# Patient Record
Sex: Female | Born: 1955
Health system: Southern US, Community
[De-identification: ages and names within clinical notes are randomized; demographics above are authoritative.]

## PROBLEM LIST (undated history)

## (undated) DIAGNOSIS — E785 Hyperlipidemia, unspecified: Secondary | ICD-10-CM

## (undated) DIAGNOSIS — C8590 Non-Hodgkin lymphoma, unspecified, unspecified site: Secondary | ICD-10-CM

## (undated) DIAGNOSIS — G8929 Other chronic pain: Secondary | ICD-10-CM

## (undated) DIAGNOSIS — F419 Anxiety disorder, unspecified: Secondary | ICD-10-CM

## (undated) DIAGNOSIS — Z87442 Personal history of urinary calculi: Secondary | ICD-10-CM

## (undated) DIAGNOSIS — I739 Peripheral vascular disease, unspecified: Secondary | ICD-10-CM

## (undated) DIAGNOSIS — I2510 Atherosclerotic heart disease of native coronary artery without angina pectoris: Secondary | ICD-10-CM

## (undated) DIAGNOSIS — F329 Major depressive disorder, single episode, unspecified: Secondary | ICD-10-CM

## (undated) DIAGNOSIS — R569 Unspecified convulsions: Secondary | ICD-10-CM

## (undated) DIAGNOSIS — M1990 Unspecified osteoarthritis, unspecified site: Secondary | ICD-10-CM

## (undated) DIAGNOSIS — R0602 Shortness of breath: Secondary | ICD-10-CM

## (undated) DIAGNOSIS — D649 Anemia, unspecified: Secondary | ICD-10-CM

## (undated) DIAGNOSIS — I779 Disorder of arteries and arterioles, unspecified: Secondary | ICD-10-CM

## (undated) DIAGNOSIS — J45909 Unspecified asthma, uncomplicated: Secondary | ICD-10-CM

## (undated) DIAGNOSIS — M549 Dorsalgia, unspecified: Secondary | ICD-10-CM

## (undated) DIAGNOSIS — I209 Angina pectoris, unspecified: Secondary | ICD-10-CM

## (undated) DIAGNOSIS — J449 Chronic obstructive pulmonary disease, unspecified: Secondary | ICD-10-CM

## (undated) DIAGNOSIS — J189 Pneumonia, unspecified organism: Secondary | ICD-10-CM

## (undated) DIAGNOSIS — Z5189 Encounter for other specified aftercare: Secondary | ICD-10-CM

## (undated) DIAGNOSIS — I70229 Atherosclerosis of native arteries of extremities with rest pain, unspecified extremity: Secondary | ICD-10-CM

## (undated) DIAGNOSIS — C519 Malignant neoplasm of vulva, unspecified: Secondary | ICD-10-CM

## (undated) DIAGNOSIS — K219 Gastro-esophageal reflux disease without esophagitis: Secondary | ICD-10-CM

## (undated) DIAGNOSIS — I509 Heart failure, unspecified: Secondary | ICD-10-CM

## (undated) DIAGNOSIS — IMO0001 Reserved for inherently not codable concepts without codable children: Secondary | ICD-10-CM

## (undated) DIAGNOSIS — C8589 Other specified types of non-Hodgkin lymphoma, extranodal and solid organ sites: Secondary | ICD-10-CM

## (undated) DIAGNOSIS — I499 Cardiac arrhythmia, unspecified: Secondary | ICD-10-CM

## (undated) DIAGNOSIS — I219 Acute myocardial infarction, unspecified: Secondary | ICD-10-CM

## (undated) DIAGNOSIS — I519 Heart disease, unspecified: Secondary | ICD-10-CM

## (undated) DIAGNOSIS — M810 Age-related osteoporosis without current pathological fracture: Secondary | ICD-10-CM

## (undated) DIAGNOSIS — C801 Malignant (primary) neoplasm, unspecified: Secondary | ICD-10-CM

## (undated) DIAGNOSIS — C8300 Small cell B-cell lymphoma, unspecified site: Secondary | ICD-10-CM

## (undated) DIAGNOSIS — I639 Cerebral infarction, unspecified: Secondary | ICD-10-CM

## (undated) DIAGNOSIS — E119 Type 2 diabetes mellitus without complications: Secondary | ICD-10-CM

## (undated) DIAGNOSIS — J8482 Adult pulmonary Langerhans cell histiocytosis: Secondary | ICD-10-CM

## (undated) DIAGNOSIS — F32A Depression, unspecified: Secondary | ICD-10-CM

## (undated) DIAGNOSIS — I10 Essential (primary) hypertension: Secondary | ICD-10-CM

## (undated) HISTORY — PX: ABDOMINAL HYSTERECTOMY: SHX81

## (undated) HISTORY — DX: Malignant neoplasm of vulva, unspecified: C51.9

## (undated) HISTORY — DX: Heart disease, unspecified: I51.9

## (undated) HISTORY — DX: Atherosclerotic heart disease of native coronary artery without angina pectoris: I25.10

## (undated) HISTORY — DX: Non-Hodgkin lymphoma, unspecified, unspecified site: C85.90

## (undated) HISTORY — DX: Type 2 diabetes mellitus without complications: E11.9

## (undated) HISTORY — PX: LUNG BIOPSY: SHX232

## (undated) HISTORY — PX: DILATION AND CURETTAGE OF UTERUS: SHX78

## (undated) HISTORY — PX: BACK SURGERY: SHX140

## (undated) HISTORY — DX: Small cell B-cell lymphoma, unspecified site: C83.00

## (undated) HISTORY — PX: OTHER SURGICAL HISTORY: SHX169

## (undated) HISTORY — DX: Chronic obstructive pulmonary disease, unspecified: J44.9

## (undated) HISTORY — DX: Disorder of arteries and arterioles, unspecified: I77.9

## (undated) HISTORY — DX: Essential (primary) hypertension: I10

## (undated) HISTORY — DX: Hyperlipidemia, unspecified: E78.5

## (undated) HISTORY — DX: Other chronic pain: G89.29

## (undated) HISTORY — PX: CARDIAC CATHETERIZATION: SHX172

## (undated) HISTORY — DX: Peripheral vascular disease, unspecified: I73.9

## (undated) HISTORY — PX: PORTACATH PLACEMENT: SHX2246

## (undated) HISTORY — DX: Dorsalgia, unspecified: M54.9

---

## 1999-08-22 ENCOUNTER — Ambulatory Visit (HOSPITAL_COMMUNITY): Admission: RE | Admit: 1999-08-22 | Discharge: 1999-08-22 | Payer: Self-pay | Admitting: Cardiovascular Disease

## 1999-08-22 ENCOUNTER — Encounter: Payer: Self-pay | Admitting: Cardiovascular Disease

## 2000-10-17 ENCOUNTER — Ambulatory Visit (HOSPITAL_COMMUNITY): Admission: RE | Admit: 2000-10-17 | Discharge: 2000-10-17 | Payer: Self-pay | Admitting: Cardiology

## 2000-10-17 ENCOUNTER — Encounter: Payer: Self-pay | Admitting: Cardiology

## 2000-10-21 ENCOUNTER — Encounter: Payer: Self-pay | Admitting: Cardiology

## 2000-10-21 ENCOUNTER — Ambulatory Visit (HOSPITAL_COMMUNITY): Admission: RE | Admit: 2000-10-21 | Discharge: 2000-10-21 | Payer: Self-pay | Admitting: Cardiology

## 2001-08-11 ENCOUNTER — Ambulatory Visit (HOSPITAL_COMMUNITY): Admission: RE | Admit: 2001-08-11 | Discharge: 2001-08-11 | Payer: Self-pay | Admitting: Pulmonary Disease

## 2001-10-27 ENCOUNTER — Inpatient Hospital Stay (HOSPITAL_COMMUNITY): Admission: EM | Admit: 2001-10-27 | Discharge: 2001-10-28 | Payer: Self-pay | Admitting: Emergency Medicine

## 2001-10-27 ENCOUNTER — Encounter: Payer: Self-pay | Admitting: Emergency Medicine

## 2002-04-16 ENCOUNTER — Encounter: Payer: Self-pay | Admitting: Cardiology

## 2002-04-16 ENCOUNTER — Encounter (HOSPITAL_COMMUNITY): Admission: RE | Admit: 2002-04-16 | Discharge: 2002-05-16 | Payer: Self-pay | Admitting: Cardiology

## 2002-04-30 ENCOUNTER — Encounter: Payer: Self-pay | Admitting: Cardiology

## 2002-04-30 ENCOUNTER — Ambulatory Visit (HOSPITAL_COMMUNITY): Admission: RE | Admit: 2002-04-30 | Discharge: 2002-04-30 | Payer: Self-pay | Admitting: Cardiology

## 2002-07-02 DIAGNOSIS — I639 Cerebral infarction, unspecified: Secondary | ICD-10-CM

## 2002-07-02 DIAGNOSIS — C8589 Other specified types of non-Hodgkin lymphoma, extranodal and solid organ sites: Secondary | ICD-10-CM

## 2002-07-02 HISTORY — DX: Other specified types of non-hodgkin lymphoma, extranodal and solid organ sites: C85.89

## 2002-07-02 HISTORY — DX: Cerebral infarction, unspecified: I63.9

## 2002-10-23 ENCOUNTER — Encounter: Payer: Self-pay | Admitting: Cardiology

## 2002-10-23 ENCOUNTER — Ambulatory Visit (HOSPITAL_COMMUNITY): Admission: RE | Admit: 2002-10-23 | Discharge: 2002-10-24 | Payer: Self-pay | Admitting: Cardiology

## 2002-12-28 ENCOUNTER — Encounter: Payer: Self-pay | Admitting: Emergency Medicine

## 2002-12-28 ENCOUNTER — Emergency Department (HOSPITAL_COMMUNITY): Admission: EM | Admit: 2002-12-28 | Discharge: 2002-12-28 | Payer: Self-pay | Admitting: Emergency Medicine

## 2003-01-07 ENCOUNTER — Ambulatory Visit (HOSPITAL_COMMUNITY): Admission: RE | Admit: 2003-01-07 | Discharge: 2003-01-07 | Payer: Self-pay | Admitting: Pulmonary Disease

## 2003-01-13 ENCOUNTER — Ambulatory Visit (HOSPITAL_COMMUNITY): Admission: RE | Admit: 2003-01-13 | Discharge: 2003-01-13 | Payer: Self-pay | Admitting: Pulmonary Disease

## 2003-02-18 ENCOUNTER — Encounter (INDEPENDENT_AMBULATORY_CARE_PROVIDER_SITE_OTHER): Payer: Self-pay | Admitting: Neurosurgery

## 2003-02-18 ENCOUNTER — Encounter (INDEPENDENT_AMBULATORY_CARE_PROVIDER_SITE_OTHER): Payer: Self-pay | Admitting: Specialist

## 2003-02-18 ENCOUNTER — Ambulatory Visit (HOSPITAL_COMMUNITY): Admission: RE | Admit: 2003-02-18 | Discharge: 2003-02-18 | Payer: Self-pay | Admitting: Neurosurgery

## 2003-02-18 ENCOUNTER — Encounter: Payer: Self-pay | Admitting: Neurosurgery

## 2003-03-12 ENCOUNTER — Ambulatory Visit (HOSPITAL_COMMUNITY): Admission: RE | Admit: 2003-03-12 | Discharge: 2003-03-12 | Payer: Self-pay | Admitting: Hematology and Oncology

## 2003-03-12 ENCOUNTER — Encounter: Payer: Self-pay | Admitting: Hematology and Oncology

## 2003-07-06 ENCOUNTER — Encounter: Admission: RE | Admit: 2003-07-06 | Discharge: 2003-07-06 | Payer: Self-pay | Admitting: Neurosurgery

## 2003-07-20 ENCOUNTER — Encounter: Admission: RE | Admit: 2003-07-20 | Discharge: 2003-07-20 | Payer: Self-pay | Admitting: Neurosurgery

## 2004-03-16 ENCOUNTER — Ambulatory Visit (HOSPITAL_COMMUNITY): Admission: RE | Admit: 2004-03-16 | Discharge: 2004-03-16 | Payer: Self-pay | Admitting: Cardiology

## 2004-05-09 ENCOUNTER — Ambulatory Visit (HOSPITAL_COMMUNITY): Admission: RE | Admit: 2004-05-09 | Discharge: 2004-05-10 | Payer: Self-pay | Admitting: Neurosurgery

## 2004-06-02 ENCOUNTER — Ambulatory Visit: Payer: Self-pay | Admitting: Cardiology

## 2004-09-08 ENCOUNTER — Ambulatory Visit: Payer: Self-pay | Admitting: Cardiology

## 2004-09-18 ENCOUNTER — Ambulatory Visit: Payer: Self-pay

## 2004-10-10 ENCOUNTER — Ambulatory Visit: Payer: Self-pay | Admitting: Cardiology

## 2004-10-18 ENCOUNTER — Inpatient Hospital Stay (HOSPITAL_COMMUNITY): Admission: EM | Admit: 2004-10-18 | Discharge: 2004-10-21 | Payer: Self-pay | Admitting: Emergency Medicine

## 2004-10-24 ENCOUNTER — Ambulatory Visit: Payer: Self-pay

## 2004-11-24 ENCOUNTER — Ambulatory Visit: Payer: Self-pay | Admitting: Cardiology

## 2004-11-30 ENCOUNTER — Ambulatory Visit: Payer: Self-pay | Admitting: Cardiology

## 2005-01-29 ENCOUNTER — Ambulatory Visit: Payer: Self-pay

## 2005-03-27 ENCOUNTER — Ambulatory Visit: Payer: Self-pay | Admitting: Cardiology

## 2005-04-15 ENCOUNTER — Inpatient Hospital Stay (HOSPITAL_COMMUNITY): Admission: EM | Admit: 2005-04-15 | Discharge: 2005-04-18 | Payer: Self-pay | Admitting: *Deleted

## 2005-08-17 ENCOUNTER — Ambulatory Visit: Payer: Self-pay | Admitting: Cardiovascular Disease

## 2005-09-28 ENCOUNTER — Ambulatory Visit: Payer: Self-pay

## 2005-09-28 ENCOUNTER — Ambulatory Visit: Payer: Self-pay | Admitting: Cardiology

## 2005-11-01 ENCOUNTER — Ambulatory Visit: Payer: Self-pay | Admitting: Cardiology

## 2006-04-12 ENCOUNTER — Ambulatory Visit: Payer: Self-pay | Admitting: Cardiology

## 2006-04-15 ENCOUNTER — Ambulatory Visit: Payer: Self-pay | Admitting: Cardiology

## 2006-07-31 ENCOUNTER — Ambulatory Visit (HOSPITAL_COMMUNITY): Admission: RE | Admit: 2006-07-31 | Discharge: 2006-07-31 | Payer: Self-pay | Admitting: Pulmonary Disease

## 2006-08-28 ENCOUNTER — Ambulatory Visit: Payer: Self-pay

## 2006-09-03 ENCOUNTER — Ambulatory Visit: Payer: Self-pay | Admitting: Cardiology

## 2006-09-09 ENCOUNTER — Ambulatory Visit: Payer: Self-pay

## 2006-11-06 ENCOUNTER — Ambulatory Visit: Payer: Self-pay | Admitting: Cardiology

## 2006-11-28 ENCOUNTER — Ambulatory Visit: Payer: Self-pay | Admitting: *Deleted

## 2006-12-20 ENCOUNTER — Ambulatory Visit (HOSPITAL_COMMUNITY): Admission: RE | Admit: 2006-12-20 | Discharge: 2006-12-20 | Payer: Self-pay | Admitting: Hematology and Oncology

## 2007-07-10 ENCOUNTER — Ambulatory Visit: Payer: Self-pay

## 2007-07-10 ENCOUNTER — Ambulatory Visit: Payer: Self-pay | Admitting: Cardiology

## 2007-07-24 ENCOUNTER — Ambulatory Visit: Payer: Self-pay | Admitting: Thoracic Surgery

## 2007-08-04 ENCOUNTER — Inpatient Hospital Stay (HOSPITAL_COMMUNITY): Admission: RE | Admit: 2007-08-04 | Discharge: 2007-08-09 | Payer: Self-pay | Admitting: Thoracic Surgery

## 2007-08-04 ENCOUNTER — Encounter: Payer: Self-pay | Admitting: Thoracic Surgery

## 2007-08-04 ENCOUNTER — Ambulatory Visit: Payer: Self-pay | Admitting: Thoracic Surgery

## 2007-08-14 ENCOUNTER — Encounter: Admission: RE | Admit: 2007-08-14 | Discharge: 2007-08-14 | Payer: Self-pay | Admitting: Thoracic Surgery

## 2007-08-14 ENCOUNTER — Ambulatory Visit: Payer: Self-pay | Admitting: Thoracic Surgery

## 2007-10-13 ENCOUNTER — Ambulatory Visit: Payer: Self-pay | Admitting: Cardiology

## 2008-04-05 ENCOUNTER — Ambulatory Visit: Payer: Self-pay | Admitting: Cardiology

## 2008-07-14 ENCOUNTER — Ambulatory Visit: Payer: Self-pay

## 2008-10-27 ENCOUNTER — Telehealth: Payer: Self-pay | Admitting: Cardiology

## 2008-10-27 DIAGNOSIS — I6529 Occlusion and stenosis of unspecified carotid artery: Secondary | ICD-10-CM

## 2008-10-27 DIAGNOSIS — I10 Essential (primary) hypertension: Secondary | ICD-10-CM

## 2008-10-27 DIAGNOSIS — I2510 Atherosclerotic heart disease of native coronary artery without angina pectoris: Secondary | ICD-10-CM | POA: Insufficient documentation

## 2008-10-27 DIAGNOSIS — J449 Chronic obstructive pulmonary disease, unspecified: Secondary | ICD-10-CM

## 2008-10-27 DIAGNOSIS — Z87898 Personal history of other specified conditions: Secondary | ICD-10-CM

## 2008-10-27 DIAGNOSIS — E785 Hyperlipidemia, unspecified: Secondary | ICD-10-CM | POA: Insufficient documentation

## 2008-10-27 DIAGNOSIS — J4489 Other specified chronic obstructive pulmonary disease: Secondary | ICD-10-CM

## 2008-10-27 DIAGNOSIS — I739 Peripheral vascular disease, unspecified: Secondary | ICD-10-CM

## 2008-10-27 HISTORY — DX: Chronic obstructive pulmonary disease, unspecified: J44.9

## 2008-10-27 HISTORY — DX: Peripheral vascular disease, unspecified: I73.9

## 2008-10-27 HISTORY — DX: Other specified chronic obstructive pulmonary disease: J44.89

## 2008-10-28 ENCOUNTER — Ambulatory Visit: Payer: Self-pay | Admitting: Internal Medicine

## 2008-10-28 ENCOUNTER — Encounter: Payer: Self-pay | Admitting: Cardiovascular Disease

## 2008-10-28 ENCOUNTER — Ambulatory Visit: Payer: Self-pay

## 2008-11-23 ENCOUNTER — Encounter: Payer: Self-pay | Admitting: Cardiology

## 2008-12-10 ENCOUNTER — Ambulatory Visit: Payer: Self-pay

## 2008-12-10 ENCOUNTER — Ambulatory Visit: Payer: Self-pay | Admitting: Cardiovascular Disease

## 2008-12-10 ENCOUNTER — Encounter (INDEPENDENT_AMBULATORY_CARE_PROVIDER_SITE_OTHER): Payer: Self-pay

## 2008-12-10 DIAGNOSIS — I70229 Atherosclerosis of native arteries of extremities with rest pain, unspecified extremity: Secondary | ICD-10-CM

## 2008-12-10 HISTORY — DX: Atherosclerosis of native arteries of extremities with rest pain, unspecified extremity: I70.229

## 2008-12-22 ENCOUNTER — Telehealth: Payer: Self-pay | Admitting: Cardiology

## 2008-12-23 ENCOUNTER — Encounter: Payer: Self-pay | Admitting: Internal Medicine

## 2008-12-23 ENCOUNTER — Encounter: Payer: Self-pay | Admitting: Cardiology

## 2008-12-24 ENCOUNTER — Ambulatory Visit: Payer: Self-pay | Admitting: Cardiology

## 2008-12-27 ENCOUNTER — Encounter: Payer: Self-pay | Admitting: Cardiovascular Disease

## 2008-12-27 ENCOUNTER — Ambulatory Visit: Payer: Self-pay

## 2008-12-29 ENCOUNTER — Telehealth: Payer: Self-pay | Admitting: Cardiology

## 2009-01-05 ENCOUNTER — Ambulatory Visit: Payer: Self-pay | Admitting: Cardiovascular Disease

## 2009-01-05 ENCOUNTER — Ambulatory Visit (HOSPITAL_COMMUNITY): Admission: RE | Admit: 2009-01-05 | Discharge: 2009-01-05 | Payer: Self-pay | Admitting: Cardiovascular Disease

## 2009-01-25 ENCOUNTER — Encounter: Payer: Self-pay | Admitting: Cardiovascular Disease

## 2009-01-25 ENCOUNTER — Ambulatory Visit: Payer: Self-pay

## 2009-01-26 ENCOUNTER — Ambulatory Visit: Payer: Self-pay

## 2009-01-26 ENCOUNTER — Ambulatory Visit: Payer: Self-pay | Admitting: Cardiovascular Disease

## 2009-02-08 ENCOUNTER — Telehealth: Payer: Self-pay | Admitting: Cardiovascular Disease

## 2009-02-14 ENCOUNTER — Encounter: Payer: Self-pay | Admitting: Cardiology

## 2009-05-16 ENCOUNTER — Encounter: Payer: Self-pay | Admitting: Cardiology

## 2009-05-17 ENCOUNTER — Encounter: Payer: Self-pay | Admitting: Cardiology

## 2009-06-28 ENCOUNTER — Encounter: Payer: Self-pay | Admitting: Cardiology

## 2009-08-12 ENCOUNTER — Ambulatory Visit: Payer: Self-pay | Admitting: Cardiovascular Disease

## 2009-08-12 ENCOUNTER — Ambulatory Visit: Payer: Self-pay

## 2009-08-12 DIAGNOSIS — I700 Atherosclerosis of aorta: Secondary | ICD-10-CM

## 2009-08-12 DIAGNOSIS — I701 Atherosclerosis of renal artery: Secondary | ICD-10-CM

## 2009-08-15 ENCOUNTER — Encounter: Payer: Self-pay | Admitting: Cardiology

## 2009-11-07 ENCOUNTER — Encounter: Payer: Self-pay | Admitting: Cardiology

## 2009-11-07 ENCOUNTER — Encounter: Payer: Self-pay | Admitting: Cardiovascular Disease

## 2010-01-30 ENCOUNTER — Encounter: Payer: Self-pay | Admitting: Cardiovascular Disease

## 2010-03-01 ENCOUNTER — Ambulatory Visit: Payer: Self-pay | Admitting: Cardiology

## 2010-04-24 ENCOUNTER — Encounter: Payer: Self-pay | Admitting: Cardiology

## 2010-04-24 ENCOUNTER — Encounter: Payer: Self-pay | Admitting: Cardiovascular Disease

## 2010-04-25 ENCOUNTER — Encounter: Payer: Self-pay | Admitting: Cardiovascular Disease

## 2010-06-14 ENCOUNTER — Encounter: Payer: Self-pay | Admitting: Cardiology

## 2010-07-23 ENCOUNTER — Encounter: Payer: Self-pay | Admitting: Pulmonary Disease

## 2010-07-23 ENCOUNTER — Encounter: Payer: Self-pay | Admitting: Thoracic Surgery

## 2010-08-03 NOTE — Letter (Signed)
Summary: [facility]   [facility]   Imported By: Roderic Ovens 11/18/2009 11:13:31  _____________________________________________________________________  External Attachment:    Type:   Image     Comment:   External Document

## 2010-08-03 NOTE — Letter (Signed)
Summary: [facility] Oncology Report   [facility] Oncology Report   Imported By: Roderic Ovens 02/10/2010 16:17:59  _____________________________________________________________________  External Attachment:    Type:   Image     Comment:   External Document

## 2010-08-03 NOTE — Miscellaneous (Signed)
Summary: Orders Update  Clinical Lists Changes  Orders: Added new Test order of Arterial Duplex Lower Extremity (Arterial Duplex Low) - Signed 

## 2010-08-03 NOTE — Letter (Signed)
Summary: [facility] F/U Note  [facility] F/U Note   Imported By: Roderic Ovens 12/20/2009 13:30:07  _____________________________________________________________________  External Attachment:    Type:   Image     Comment:   External Document

## 2010-08-03 NOTE — Letter (Signed)
Summary: [facility] Office Note  [facility] Office Note   Imported By: Roderic Ovens 12/05/2009 12:06:59  _____________________________________________________________________  External Attachment:    Type:   Image     Comment:   External Document

## 2010-08-03 NOTE — Miscellaneous (Signed)
Summary: Orders Update  Clinical Lists Changes  Problems: Added new problem of AORTIC ATHEROSCLEROSIS (ICD-440.0) Orders: Added new Test order of Abdominal Aorta Duplex (Abd Aorta Duplex) - Signed

## 2010-08-03 NOTE — Assessment & Plan Note (Signed)
Summary: J1B   Visit Type:  31m follow up Primary Hannia Matchett:  Dr Isabel Caprice  CC:  shortness of breath and chest pain.  History of Present Illness: Lately has not had chemotherapy because her cells bottom out.  She feels better since gaining some weight. [name] scheduled for MRI with contrast.   She has alot of back pain.  She has seen Dr. Excell Seltzer, and her stents are blocked, but she does get a a fair amount of claudication in the left leg associated with activity.  She does not smoke as much as she did.    Current Medications (verified): 1)  Metoprolol Tartrate 50 Mg Tabs (Metoprolol Tartrate) .... Take 1/2 Tablet By Mouth Twice A Day 2)  Nitroglycerin 0.4 Mg Subl (Nitroglycerin) .... Place 1 Tablet Under Tongue As Directed 3)  Fish Oil 1000 Mg Caps (Omega-3 Fatty Acids) .... Take 1 Capsule By Mouth Once A Day 4)  Vitamin B-12 500 Mcg  Tabs (Cyanocobalamin) .... Take 1 Tablet By Mouth Once A Day 5)  Prednisone 20 Mg Tabs (Prednisone) .... Take 1 Tablet By Mouth Once A Day 6)  Ventolin Hfa 108 (90 Base) Mcg/act Aers (Albuterol Sulfate) .... Three Times A Day 7)  Diazepam 5 Mg Tabs (Diazepam) .... Three Times A Day 8)  Alprazolam 1 Mg Tabs (Alprazolam) .... Three Times A Day 9)  Roxicodone 30 Mg Tabs (Oxycodone Hcl) .... As Needed 10)  Oxycontin 40 Mg Xr12h-Tab (Oxycodone Hcl) .... As Needed 11)  Folic Acid 1 Mg Tabs (Folic Acid) .... One Tablet By Mouth Once Daily 12)  Fluorometholone 0.1 % Susp (Fluorometholone) .... Both Eyes Two Times A Day 13)  Aspirin 81 Mg Tbec (Aspirin) .... Take One Tablet By Mouth Daily 14)  Nexium 40 Mg Cpdr (Esomeprazole Magnesium) .... Once Daily 15)  Sulfamethoxazole-Trimethoprim 400-80 Mg/58ml Soln (Sulfamethoxazole-Trimethoprim) .... Take 2 Talbets Every Saturday and Sunday  (Rx Dose Unknown)  Allergies (verified): No Known Drug Allergies  Vital Signs:  Patient profile:   54 year old female Height:      64 inches Weight:      154.50 pounds BMI:      26 .62 Pulse rate:   68 / minute BP sitting:   124 / 86  (right arm) Cuff size:   large  Vitals Entered By: Caralee Ates CMA (March 01, 2010 4:00 PM)  Physical Exam  Head:  normocephalic and atraumatic Eyes:  PERRLA/EOM intact; conjunctiva and lids normal. Lungs:  Minimal Ronchii, and no rales. Heart:  PMI non displaced.  Normal S1 and S2.  Minimal SEM.    Pulses:  decreases pulses LLE.    EKG  Procedure date:  03/01/2010  Findings:      NSR.  WNL.    Impression & Recommendations:  Problem # 1:  CAD, NATIVE VESSEL (ICD-414.01) Has been stable.  No chest pain at present. Her updated medication list for this problem includes:    Metoprolol Tartrate 50 Mg Tabs (Metoprolol tartrate) .[name]... Take 1/2 tablet by mouth twice a day    Nitroglycerin 0.4 Mg Subl (Nitroglycerin) .[name]... Place 1 tablet under tongue as directed    Aspirin 81 Mg Tbec (Aspirin) .[name]... 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  Saw Dr. Excell Seltzer.   Problem # 3:  Hx of NON-HODGKIN'S LYMPHOMA, HX OF (ICD-V10.79) still receiving chemotherapy.   Patient Instructions: 1)  Your physician recommends that you schedule a  follow-up appointment in: 6 months 2)  Your physician recommends that you continue on your current medications as directed. Please refer to the Current Medication list given to you today.

## 2010-08-03 NOTE — Assessment & Plan Note (Signed)
Summary: f68m   Visit Type:  6 months follow up Primary Provider:  Dr Isabel Caprice  CC:  bilateral leg pain.  History of Present Illness: 55 year-old woman with multiple medical problems presenting today for f/u of her vascular disease. She presented with bilateral leg claudication last year and was found to have distal aortic stenosis on ultrasound with abnormal ABI's. She underwent PTA and stenting of the abdominal aorta in July 2010 and has had symptomatic improvement following stenting.  She has cut down on cigarettes, but has not been able to quit.  [name] limited by leg pain and dyspnea. [name] able to walk 10 - 15 minutes without stopping. She has left calf discomfort with walking, worse than the right side. No rest pain or ulcers. Symptoms are much better than prior to stenting. She has episodic chest pain, unchanged over time and nonexertional.  Current Medications (verified): 1)  Metoprolol Tartrate 50 Mg Tabs (Metoprolol Tartrate) .... Take 1/2 Tablet By Mouth Twice A Day 2)  Nitroglycerin 0.4 Mg Subl (Nitroglycerin) .... Place 1 Tablet Under Tongue As Directed 3)  Fish Oil 1000 Mg Caps (Omega-3 Fatty Acids) .... Take 1 Capsule By Mouth Once A Day 4)  Vitamin B-12 500 Mcg  Tabs (Cyanocobalamin) .... Take 1 Tablet By Mouth Once A Day 5)  Prednisone 20 Mg Tabs (Prednisone) .... Take 1 Tablet By Mouth Once A Day 6)  Ventolin Hfa 108 (90 Base) Mcg/act Aers (Albuterol Sulfate) .... Three Times A Day 7)  Hydrocodone-Acetaminophen 10-325 Mg Tabs (Hydrocodone-Acetaminophen) .... As Needed 8)  Diazepam 5 Mg Tabs (Diazepam) .... Three Times A Day 9)  Alprazolam 1 Mg Tabs (Alprazolam) .... Three Times A Day 10)  Oxycodone Hcl 15 Mg Tabs (Oxycodone Hcl) .... As Needed 11)  Oxycontin 40 Mg Xr12h-Tab (Oxycodone Hcl) .... As Needed 12)  Folic Acid 1 Mg Tabs (Folic Acid) .... One Tablet By Mouth Once Daily 13)  Vit A .... Once Daily 14)  Fluorometholone 0.1 % Susp (Fluorometholone) .... Both  Eyes Two Times A Day  Allergies (verified): No Known Drug Allergies  Past History:  Past medical history reviewed for relevance to current acute and chronic problems.  Past Medical History: Coronary artery disease status post stenting of the aortic and     iliac vessels by Dr. Allyson Sabal. Repeat aortic stenting 2010. 2. Coronary artery disease with occlusion of the right coronary artery     filled by collaterals from the left coronary system. 3. History of left ventricular dysfunction with ejection fraction of     35% range. 4. History of carotid cerebrovascular disease with 0-39% bilateral     internal carotid artery stenosis with no flow detected in the left     vertebral artery and the left subclavian disease without definite     symptoms. 5. History of non-Hodgkin's lymphoma. 6. Chronic back pain status post lumbar surgery. 7. Hyperlipidemia. 8. Hypertension. 9. Status post lung resection. 10.Chronic obstructive pulmonary disease.  Review of Systems       Negative except as per HPI   Vital Signs:  Patient profile:   55 year old female Height:      64 inches Weight:      136 pounds BMI:     23.43 Pulse rate:   64 / minute Pulse rhythm:   regular Resp:     18 per minute BP sitting:   110 / 62  (right arm) Cuff size:   large  Vitals Entered By: Doree Fudge  Jaramillo (August 12, 2009 11:49 AM)  Physical Exam  General:  Pt is alert and oriented, in no acute distress. HEENT: normal Neck: normal carotid upstrokes without bruits, JVP normal Lungs: CTA CV: RRR without murmur or gallop Abd: soft, NT, positive BS, no bruit, no organomegaly Ext: no clubbing, cyanosis, or edema. femoral pulses 2+= with bilateral bruits, PT pulses 2+=, DP pulses difficult to palpate Skin: warm and dry without rash    Impression & Recommendations:  Problem # 1:  AORTIC ATHEROSCLEROSIS (ICD-440.0) Pt is stable s/p stenting. She had ultrasound studies done today - these have not yet been  interpreted. Prelim review showed a patent aortic stent, elevated left iliac velocities, and ABI's of 1.0 on the right and 0.9 on the left. She can be followed clinically and should have repeat studies in one year. Tobacco cessation was reviewed in detail and the negative impact on vascular and cardiac outcomes was stressed. There was greater than 3 min discussion on this. She should continue on antiplatelet Rx with ASA. We will need to verify that she is taking this because it's not on her med list - will call to confirm.  Problem # 2:  CAROTID ARTERY DISEASE (ICD-433.10) Mild by ultrasound last year. follow-up with duplex scan 2012.   The following medications were removed from the medication list:    Plavix 75 Mg Tabs (Clopidogrel bisulfate) ..... Once daily  Problem # 3:  CAD, NATIVE VESSEL (ICD-414.01) Stable without exertional angina. Cath results from 2010 reviewed and show chronic RCA occlusion. Followed by Dr Riley Kill - will set her up to see Dr Riley Kill in 6 months and I will see her back in 12 months.  The following medications were removed from the medication list:    Plavix 75 Mg Tabs (Clopidogrel bisulfate) ..... Once daily Her updated medication list for this problem includes:    Metoprolol Tartrate 50 Mg Tabs (Metoprolol tartrate) .[name]... Take 1/2 tablet by mouth twice a day    Nitroglycerin 0.4 Mg Subl (Nitroglycerin) .[name]... Place 1 tablet under tongue as directed  Problem # 4:  ATHEROSCLEROSIS, RENAL ARTERY (ICD-440.1) Pt had increased velocities across the left renal artery in the severe range. BP is well-controlled and renal function has been ok. Follow-up renal duplex next year when she has her other ultrasound studies.  Patient Instructions: 1)  Your physician wants you to follow-up in: 6 MONTHS with Dr Riley Kill and 1 YEAR with Dr Excell Seltzer.  You will receive a reminder letter in the mail two months in advance. If you don't receive a letter, please call our office to schedule the  follow-up appointment. 2)  Your physician has requested that you have an abdominal aorta duplex in 1 YEAR. During this test, an ultrasound is used to evaluate the aorta. Allow 30 minutes for this exam. Do not eat after midnight the day before and avoid carbonated beverages. There are no restrictions or special instructions. 3)  Your physician has requested that you have an ankle brachial index (ABI) in 1 YEAR. During this test an ultrasound and blood pressure cuff are used to evaluate the arteries that supply the arms and legs with blood. Allow thirty minutes for this exam. There are no restrictions or special instructions. 4)  Your physician has requested that you have a carotid duplex in 1 YEAR. This test is an ultrasound of the carotid arteries in your neck. It looks at blood flow through these arteries that supply the brain with blood. Allow one hour for  this exam. There are no restrictions or special instructions. 5)  Your physician has requested that you have a renal artery duplex in 1 YEAR. During this test, an ultrasound is used to evaluate blood flow to the kidneys. Allow one hour for this exam. Do not eat after midnight the day before and avoid carbonated beverages. Take your medications as you usually do.  Appended Document: f51m Dr Excell Seltzer wanted to clarify why the pt does not have Aspirin listed on her medication list.  I spoke with the pt and she stopped Aspirin about a month ago due to bruising and bleeding easily if she cut herself.  I told the pt with her history Dr Excell Seltzer recommended that the pt restart Aspirin 81mg  once a day. The pt agrees with the plan.   Clinical Lists Changes  Medications: Added new medication of ASPIRIN 81 MG TBEC (ASPIRIN) Take one tablet by mouth daily

## 2010-08-03 NOTE — Letter (Signed)
Summary: [facility] Office Note   [facility] Office Note   Imported By: Roderic Ovens 06/08/2010 10:10:01  _____________________________________________________________________  External Attachment:    Type:   Image     Comment:   External Document

## 2010-08-03 NOTE — Letter (Signed)
Summary: [facility] Note  [facility] Note   Imported By: Roderic Ovens 01/27/2010 13:43:36  _____________________________________________________________________  External Attachment:    Type:   Image     Comment:   External Document

## 2010-08-15 ENCOUNTER — Encounter: Payer: Self-pay | Admitting: Cardiovascular Disease

## 2010-08-16 ENCOUNTER — Encounter (INDEPENDENT_AMBULATORY_CARE_PROVIDER_SITE_OTHER): Payer: Medicare Other

## 2010-08-16 ENCOUNTER — Encounter: Payer: Self-pay | Admitting: Cardiovascular Disease

## 2010-08-16 DIAGNOSIS — I2510 Atherosclerotic heart disease of native coronary artery without angina pectoris: Secondary | ICD-10-CM

## 2010-08-16 DIAGNOSIS — G458 Other transient cerebral ischemic attacks and related syndromes: Secondary | ICD-10-CM

## 2010-08-16 DIAGNOSIS — I6529 Occlusion and stenosis of unspecified carotid artery: Secondary | ICD-10-CM

## 2010-08-16 DIAGNOSIS — I700 Atherosclerosis of aorta: Secondary | ICD-10-CM

## 2010-08-16 DIAGNOSIS — I70219 Atherosclerosis of native arteries of extremities with intermittent claudication, unspecified extremity: Secondary | ICD-10-CM

## 2010-08-23 NOTE — Letter (Signed)
Summary: [facility] Office Note   [facility] Office Note   Imported By: Roderic Ovens 08/18/2010 14:08:03  _____________________________________________________________________  External Attachment:    Type:   Image     Comment:   External Document

## 2010-08-23 NOTE — Letter (Signed)
Summary: [facility]  [facility]   Imported By: Marylou Mccoy 08/09/2010 15:43:37  _____________________________________________________________________  External Attachment:    Type:   Image     Comment:   External Document

## 2010-08-23 NOTE — Letter (Signed)
Summary: [facility] Office Note   [facility] Office Note   Imported By: Roderic Ovens 08/17/2010 12:36:57  _____________________________________________________________________  External Attachment:    Type:   Image     Comment:   External Document

## 2010-08-23 NOTE — Miscellaneous (Signed)
Summary: Orders Update  Clinical Lists Changes  Orders: Added new Test order of Arterial Duplex Lower Extremity (Arterial Duplex Low) - Signed 

## 2010-08-23 NOTE — Miscellaneous (Signed)
Summary: Orders Update  Clinical Lists Changes  Orders: Added new Test order of Carotid Duplex (Carotid Duplex) - Signed 

## 2010-08-23 NOTE — Miscellaneous (Signed)
Summary: Orders Update  Clinical Lists Changes  Orders: Added new Test order of Renal Artery Duplex (Renal Artery Duplex) - Signed 

## 2010-08-24 ENCOUNTER — Ambulatory Visit: Payer: Self-pay | Admitting: Cardiovascular Disease

## 2010-08-30 ENCOUNTER — Ambulatory Visit (INDEPENDENT_AMBULATORY_CARE_PROVIDER_SITE_OTHER): Payer: Medicare Other | Admitting: Cardiovascular Disease

## 2010-08-30 ENCOUNTER — Encounter: Payer: Self-pay | Admitting: Cardiovascular Disease

## 2010-08-30 DIAGNOSIS — I70219 Atherosclerosis of native arteries of extremities with intermittent claudication, unspecified extremity: Secondary | ICD-10-CM

## 2010-08-31 ENCOUNTER — Ambulatory Visit: Payer: Self-pay | Admitting: Cardiology

## 2010-09-07 NOTE — Assessment & Plan Note (Signed)
Summary: F1Y   Visit Type:  Follow-up Primary Provider:  Dr Isabel Caprice  CC:  leg pain.  History of Present Illness: 55 year-old woman with diffuse coronary and peripheral vascular disease presents for followup evaluation. She has undergone previous iliac and aortic stenting. Most recent ABI's and duplex scanning performed earlier this month showed severely elevated left iliac velocities with a drop in her left ABI to 0.65.  She describes numbness in both legs. She describes pain in both legs with walking, but the left leg is much worse than the right. The pain occurs at a short distance (walking from waiting room into exam room). Denies rest pain or ulceration.   She also complains of shortness of breath. No cough. Episodic chest pain but not as bad as in past.  Current Medications (verified): 1)  Metoprolol Tartrate 50 Mg Tabs (Metoprolol Tartrate) .... Take 1/2 Tablet By Mouth Twice A Day 2)  Nitroglycerin 0.4 Mg Subl (Nitroglycerin) .... Place 1 Tablet Under Tongue As Directed 3)  Fish Oil 1000 Mg Caps (Omega-3 Fatty Acids) .... Take 1 Capsule By Mouth Once A Day 4)  Vitamin B-12 500 Mcg  Tabs (Cyanocobalamin) .... Take 1 Tablet By Mouth Once A Day 5)  Prednisone 20 Mg Tabs (Prednisone) .... Take 1 Tablet By Mouth Once A Day 6)  Ventolin Hfa 108 (90 Base) Mcg/act Aers (Albuterol Sulfate) .... Three Times A Day 7)  Diazepam 5 Mg Tabs (Diazepam) .... Three Times A Day 8)  Alprazolam 1 Mg Tabs (Alprazolam) .... Three Times A Day 9)  Roxicodone 30 Mg Tabs (Oxycodone Hcl) .... As Needed 10)  Oxycontin 40 Mg Xr12h-Tab (Oxycodone Hcl) .... As Needed 11)  Fluorometholone 0.1 % Susp (Fluorometholone) .... Both Eyes Two Times A Day 12)  Aspirin 81 Mg Tbec (Aspirin) .... Take One Tablet By Mouth Daily 13)  Nexium 40 Mg Cpdr (Esomeprazole Magnesium) .... Once Daily 14)  Sulfamethoxazole-Trimethoprim 400-80 Mg/53ml Soln (Sulfamethoxazole-Trimethoprim) .... Take 2 Talbets Every Saturday and "Sunday   (Rx Dose Unknown) 15)  Crestor 40 Mg Tabs (Rosuvastatin Calcium) .... Once Daily 16)  Wellbutrin Xl 150 Mg Xr24h-Tab (Bupropion Hcl) .... One Tablet Once Daily  Allergies (verified): No Known Drug Allergies  Past History:  Past medical history reviewed for relevance to current acute and chronic problems.  Past Medical History: Reviewed history from 08/12/2009 and no changes required. Coronary artery disease status post stenting of the aortic and     iliac vessels by Dr. Berry. Repeat aortic stenting 2010. 2. Coronary artery disease with occlusion of the right coronary artery     filled by collaterals from the left coronary system. 3. History of left ventricular dysfunction with ejection fraction of     35% range. 4. History of carotid cerebrovascular disease with 0-39% bilateral     internal carotid artery stenosis with no flow detected in the left     vertebral artery and the left subclavian disease without definite     symptoms. 5. History of non-Hodgkin's lymphoma. 6. Chronic back pain status post lumbar surgery. 7. Hyperlipidemia. 8. Hypertension. 9. Status post lung resection. 10.Chronic obstructive pulmonary disease.  Review of Systems       Positive for fatigue, weakness, weight gain. Otherwise negative except as per HPI   Vital Signs:  Patient profile:   54 year old female Height:      64 inches Weight:      167 pounds BMI:     28" .77 Pulse rate:   70 /  minute Pulse rhythm:   regular Resp:     16 per minute BP sitting:   132 / 74  (right arm) Cuff size:   regular  Vitals Entered By: Judithe Modest CMA (August 30, 2010 1:59 PM)  Physical Exam  General:  Pt is alert and oriented, Cushingoid, in no acute distress. HEENT: normal Neck: normal carotid upstrokes with bilateral bruits, JVP normal Lungs: CTA CV: RRR without murmur or gallop Abd: soft, NT, positive BS, obese, positive bruit, no organomegaly Ext: no clubbing, cyanosis, or edema. femoral pulses  diminished bilaterally - 1+ right and trace left. DP/PT 1+ on right, nonpalp on left Skin: diffuse ecchymoses    ABI's  Procedure date:  08/16/2010  Findings:      Right leg 0.91, Left leg 0.65  EKG  Procedure date:  08/30/2010  Findings:      NSr, T wave changes consider lateral ischemia.  Carotid Doppler  Procedure date:  08/16/2010  Findings:      40-59% bilateral ICA stenosis, severe left subclavian stenosis.  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 She has undergone multiple aorto-iliac stent procedures and has severe left iliac restenosis based on findings of her noninvasive studies. This is associated with a reduced ABI. Unfortunately she continues to smoke - she understands that her arterial disease is directly related to this. Because of her marked limitation associated with severe inflow disease, I have recommended aortic angiography with an eye toward aortic/lower extremity PTA. Risks and indications of the procedure were reviewed with her in detail and she agrees to proceed. I will review her case with Dr Cleone Slim who follows her closely. One consideration would be treatment with a covered stent ([facility] iCast), but I am concerned about her ability to adhere to long-term dual antiplatelet Rx.  Orders: EKG w/ Interpretation (93000) PV Procedure (PV Procedure)  Problem # 2:  CAROTID ARTERY DISEASE (ICD-433.10) Continue med management. Tobacco cessation discussed. No role for subclavian intervention as she has no symptoms of arm claudication or vertebrobasilar insufficiency.  Her updated medication list for this problem includes:    Aspirin 81 Mg Tbec (Aspirin) .[name]... Take one tablet by mouth daily  Patient Instructions: 1)  Your physician recommends that you continue on your current medications as directed. Please refer to the Current  Medication list given to you today. 2)  Your physician has requested that you have a peripheral vascular angiogram. This exam is performed at the hospital. During this exam IV contrast is used to look at arterial blood flow.  Please review the information sheet given for details.

## 2010-09-07 NOTE — Letter (Addendum)
Summary: Peripheral Vascular  [facility], Main Office  [address]   [place], [place] [address]   Phone: [phone]  Fax: [phone]     08/30/2010 MRN: [id number]  [facility] [address] [place], [place]  [address]  [place]  Dear Ms. Gelpi,   You are scheduled for Peripheral Vascular Angiogram on Wednesday September 13, 2010 with Dr. Excell Seltzer.  Please arrive at the [facility] at 9:30     a.m. on the day of your procedure.  1. DIET     _X___ Nothing to eat or drink after midnight except your medications with a sip of water.  2. MAKE SURE YOU TAKE YOUR ASPIRIN.  3. _X___ YOU MAY TAKE ALL of your remaining medications with a small amount of water.      _X___ START NEW medications: Please take 300mg  of Plavix on 09/12/10--samples given      4. Plan for one night stay - bring personal belongings (i.e. toothpaste, toothbrush, etc.)  5. Bring a current list of your medications and current insurance cards.  6. Must have a responsible person to drive you home.   7. Someone must be with you for the first 24 hours after you arrive home.  8. Please wear clothes that are easy to get on and off and wear slip-on shoes.  *Special note: Every effort is made to have your procedure done on time.  Occasionally there are emergencies that present themselves at the hospital that may cause delays.  Please be patient if a delay does occur.  If you have any questions after you get home, please call the office at the number listed above.   Julieta Gutting, RN, BSN  Appended Document: Peripheral Vascular Pt procedure rescheduled to 09/27/10 at 11:30.  Pt will take Plavix on 09/26/10.

## 2010-09-12 ENCOUNTER — Telehealth: Payer: Self-pay | Admitting: Cardiovascular Disease

## 2010-09-19 NOTE — Progress Notes (Signed)
Summary: need to R/S procedure  Phone Note Call from Patient Call back at Home Phone [phone]   Caller: Patient Summary of Call: Pt need to R/S procedure that is scheduled for tomorrow due to her being sick Initial call taken by: Judie Grieve,  September 12, 2010 11:17 AM  Follow-up for Phone Call        The pt has been having problems with acid reflux for 3 days and has been having to sleep in a chair at night due to reflux.  The pt said her insurance will not pay for reflux meds at this time.  I spoke with Selena Batten in the cath lab and rescheduled the pt's procedure to 09/27/10 at 11:30.  The pt was instructed to take Plavix on 09/26/10. Pt aware of new procedure date.  Follow-up by: Julieta Gutting, RN, BSN,  September 12, 2010 12:15 PM

## 2010-09-23 ENCOUNTER — Encounter: Payer: Self-pay | Admitting: Cardiology

## 2010-09-27 ENCOUNTER — Inpatient Hospital Stay (HOSPITAL_COMMUNITY)
Admission: RE | Admit: 2010-09-27 | Discharge: 2010-09-28 | DRG: 253 | Disposition: A | Discharge status: Home / Self Care | Payer: Medicare Other | Source: Ambulatory Visit | Attending: Cardiovascular Disease | Admitting: Cardiovascular Disease

## 2010-09-27 DIAGNOSIS — F17200 Nicotine dependence, unspecified, uncomplicated: Secondary | ICD-10-CM | POA: Diagnosis present

## 2010-09-27 DIAGNOSIS — G8929 Other chronic pain: Secondary | ICD-10-CM | POA: Diagnosis present

## 2010-09-27 DIAGNOSIS — I70219 Atherosclerosis of native arteries of extremities with intermittent claudication, unspecified extremity: Principal | ICD-10-CM | POA: Diagnosis present

## 2010-09-27 DIAGNOSIS — J4489 Other specified chronic obstructive pulmonary disease: Secondary | ICD-10-CM | POA: Diagnosis present

## 2010-09-27 DIAGNOSIS — I6529 Occlusion and stenosis of unspecified carotid artery: Secondary | ICD-10-CM | POA: Diagnosis present

## 2010-09-27 DIAGNOSIS — I10 Essential (primary) hypertension: Secondary | ICD-10-CM | POA: Diagnosis present

## 2010-09-27 DIAGNOSIS — Z7902 Long term (current) use of antithrombotics/antiplatelets: Secondary | ICD-10-CM

## 2010-09-27 DIAGNOSIS — I2510 Atherosclerotic heart disease of native coronary artery without angina pectoris: Secondary | ICD-10-CM | POA: Diagnosis present

## 2010-09-27 DIAGNOSIS — I2589 Other forms of chronic ischemic heart disease: Secondary | ICD-10-CM | POA: Diagnosis present

## 2010-09-27 DIAGNOSIS — J449 Chronic obstructive pulmonary disease, unspecified: Secondary | ICD-10-CM | POA: Diagnosis present

## 2010-09-27 DIAGNOSIS — C8589 Other specified types of non-Hodgkin lymphoma, extranodal and solid organ sites: Secondary | ICD-10-CM | POA: Diagnosis present

## 2010-09-27 DIAGNOSIS — Z7982 Long term (current) use of aspirin: Secondary | ICD-10-CM

## 2010-09-27 DIAGNOSIS — E785 Hyperlipidemia, unspecified: Secondary | ICD-10-CM | POA: Diagnosis present

## 2010-09-27 LAB — CBC
MCH: 33.2 pg (ref 26.0–34.0)
MCHC: 33.8 g/dL (ref 30.0–36.0)
MCV: 98.1 fL (ref 78.0–100.0)
Platelets: 103 10*3/uL — ABNORMAL LOW (ref 150–400)
RDW: 14.7 % (ref 11.5–15.5)
WBC: 10.2 10*3/uL (ref 4.0–10.5)

## 2010-09-27 LAB — BASIC METABOLIC PANEL
BUN: 13 mg/dL (ref 6–23)
CO2: 28 mEq/L (ref 19–32)
Calcium: 9.4 mg/dL (ref 8.4–10.5)
Creatinine, Ser: 0.86 mg/dL (ref 0.4–1.2)
GFR calc Af Amer: >60 mL/min (ref >60)
Glucose, Bld: 99 mg/dL (ref 70–99)

## 2010-09-27 LAB — GLUCOSE, CAPILLARY: Glucose-Capillary: 80 mg/dL (ref 70–99)

## 2010-09-28 LAB — GLUCOSE, CAPILLARY: Glucose-Capillary: 87 mg/dL (ref 70–99)

## 2010-09-28 LAB — BASIC METABOLIC PANEL
CO2: 30 mEq/L (ref 19–32)
Calcium: 9.4 mg/dL (ref 8.4–10.5)
Chloride: 102 mEq/L (ref 96–112)
GFR calc Af Amer: >60 mL/min (ref >60)
Sodium: 143 mEq/L (ref 135–145)

## 2010-09-28 LAB — CBC
Hemoglobin: 13.5 g/dL (ref 12.0–15.0)
Platelets: 95 10*3/uL — ABNORMAL LOW (ref 150–400)
RBC: 4.13 MIL/uL (ref 3.87–5.11)

## 2010-10-02 NOTE — Procedures (Signed)
NAMESHARAY, BELLISSIMO              ACCOUNT NO.:  [id number]  MEDICAL RECORD NO.:  [id number]           PATIENT TYPE:  O  LOCATION:  6531                         FACILITY:  [facility]  PHYSICIAN:  Veverly Fells. Excell Seltzer, MD  DATE OF BIRTH:  [birth year]  DATE OF PROCEDURE: DATE OF DISCHARGE:                   PERIPHERAL VASCULAR INVASIVE PROCEDURE   PROCEDURES: 1. Abdominal aortic angiogram with bilateral runoff. 2. Bilateral common iliac balloon angioplasty.  PROCEDURAL INDICATIONS:  Ms. Delcid is a 55 year old woman with severe peripheral arterial disease.  She has undergone aortoiliac stenting on several occasions.  I last treated her about 2 years ago when she required a second abdominal aortic stent.  [name] presenting with progressive left leg pain and was found to have an ABI on the left of 0.67 with pressure findings suggestive of severe inflow disease.  We reviewed risks and indications of angiography and PTA with possible stenting.  She understood these risks and agreed to proceed.  Both groins were prepped and draped.  The right femoral artery was accessed with a moderate amount of difficulty.  A 5-French sheath was placed in the right femoral artery using modified Seldinger technique. A Versacore wire was advanced into the suprarenal abdominal aorta and a pigtail catheter was placed just above the most proximal abdominal aortic stent.  Angiography was performed.  LV angiography and oblique angiography was performed of the aortoiliac bifurcation down to the femoral head.  This confirmed severe stenosis at the origin of the left common iliac artery with a previously stented segment.  The aortic stents were patent.  A runoff was performed in the feet and this demonstrated no significant infrainguinal disease bilaterally.  The patient actually has three-vessel runoff to both feet with slower filling on the left side.  I thought the best way to treat her would be with balloon  angioplasty.  She already has multiple stents and she has very small vessels.  The left femoral artery was accessed again with a good bit of difficulty using modified Seldinger technique.  A 7-French Bright Tip sheath was advanced.  Once the sheath was placed, the patient was given weight-based heparin at 50 units/kg.  She has already been preloaded with 300 mg of Plavix, which she took yesterday.  The vessel with the common iliac into the distal abdominal aorta was dilated with a 6 x 20-mm balloon, which was taken to 14 atmospheres.  This improved the appearance of the left common iliac.  There did appear to be little more narrowing at the right common iliac origin.  I thought it would be best to treat her with Kissing balloons.  The same type of 6 x 20 balloon was advanced over a Versacore wire into the right common iliac artery, so that both balloons were at the aortic bifurcation balloon inflation with each 6 x 20 balloon was taken to 15 atmospheres, which is burst pressure.  Final angiography demonstrated an adequate result.  There was some haziness at the origin of left common iliac where the stents overlapped, but I cannot tell if there was significant stenosis in that area.  I elected to do an end-hole catheter pullback across both  sides from the distal abdominal aorta back into the sheath.  On the right side, there was no significant gradient at the iliac origin.  There was a about 15-mm gradient throughout the external iliac artery.  I do not think this warranted treatment.  On the left side, there was only a 5-mm gradient.  Again I felt this was an adequate result.  DIAGNOSTIC FINDINGS:  The abdominal aorta is patent.  It is diffusely diseased.  The aortic stents are widely patent in the distal abdominal aorta.  There is mild narrowing at the right common iliac origin and also another area of mild narrowing just beyond the area of stents in the right common iliac artery.  The  external iliac on the right is widely patent.  The internal iliac has severe stenosis.  The common femoral, deep femoral, superficial femoral, and popliteal are widely patent on the right.  There is three-vessel runoff to the right foot.  Left leg:  The left common iliac has a severe 75-80% ostial stenosis, which is best appreciated in RAO oblique view.  It is really not seen well on other views.  The remaining portions of the common, internal, and external iliac arteries are patent.  Common femoral, superficial femoral, deep femoral, and popliteal are patent.  There is three-vessel runoff to the left foot.  FINAL CONCLUSION: 1. Severe ostial left iliac stenosis with successful treatment using a     6-mm balloon.  The area treated was within the stent to treat in-     stent restenosis. 2. Successful PTA of the right common iliac artery after plaque shift     from the left side.  RECOMMENDATIONS:  The patient will need to continue aspirin and Plavix for a minimum of 30 days.  We will discuss the importance of tobacco cessation.  Her chances for stent patency will be greatly increased if she is at most stop smoking.  The patient will be observed overnight. We will plan on discharge in the morning.     Veverly Fells. Excell Seltzer, MD     MDC/MEDQ  D:  09/27/2010  T:  09/28/2010  Job:  [id number]  cc:   Arturo Morton. Riley Kill, MD, [facility] Lynett Fish, M.D.  Electronically Signed by [name] MD on 10/02/2010 01:14:10 PM

## 2010-10-02 NOTE — Discharge Summary (Signed)
  [name], [name]              ACCOUNT NO.:  [id number]  MEDICAL RECORD NO.:  [id number]           PATIENT TYPE:  O  LOCATION:  6531                         FACILITY:  [facility]  PHYSICIAN:  Veverly Fells. Excell Seltzer, MD  DATE OF BIRTH:  1955/07/31  DATE OF ADMISSION:  09/27/2010 DATE OF DISCHARGE:  09/28/2010                              DISCHARGE SUMMARY   FINAL DIAGNOSIS:  Lower extremity atherosclerosis with intermittent claudication.  SECONDARY DIAGNOSES: 1. Coronary artery disease. 2. Ischemic cardiomyopathy. 3. Carotid arterial disease. 4. Non-Hodgkin lymphoma. 5. Chronic back pain. 6. Hyperlipidemia. 7. Hypertension. 8. Chronic obstructive pulmonary disease. 9. Ongoing tobacco abuse.  PROCEDURES PERFORMED:  On March 28, the patient underwent abdominal aortic angiography with bilateral lower extremity runoff.  She was treated with bilateral iliac PTA for treatment of severe ostial common iliac stenosis.  She tolerated the procedure well and had no procedural complications.  HOSPITAL COURSE:  The patient was admitted following her peripheral vascular procedure as above.  She underwent kissing balloon angioplasty of the bilateral common iliac origins.  On the day of discharge, she had 2+ pedal pulses in both feet.  Her groin sites appeared stable without hematoma or ecchymoses.  Her lab work was reviewed and she has a platelet count 95,000, otherwise her CBC and metabolic panel were within normal limits.  She has chronic mild thrombocytopenia and this is essentially unchanged.  DISPOSITION:  The patient will be discharged home in stable condition. She will follow up in 3-4 weeks for repeat ABIs.  She will continue her regular follow up with Dr. Riley Kill and Dr. Cleone Slim.  I have asked that she continue with aspirin and Plavix for a minimum of 30 days and she should be on long-term low-dose aspirin.  For the remainder of her medications, please see the full medication  reconciliation list.     Veverly Fells. Excell Seltzer, MD     MDC/MEDQ  D:  09/28/2010  T:  09/28/2010  Job:  [id number]  cc:   Lynett Fish, M.D. Arturo Morton. Riley Kill, MD, [facility]  Electronically Signed by [name] MD on 10/02/2010 01:14:12 PM

## 2010-10-04 ENCOUNTER — Ambulatory Visit: Payer: Self-pay | Admitting: Cardiology

## 2010-10-09 LAB — GLUCOSE, CAPILLARY: Glucose-Capillary: 89 mg/dL (ref 70–99)

## 2010-10-09 LAB — CBC
Hemoglobin: 13.7 g/dL (ref 12.0–15.0)
MCHC: 34.7 g/dL (ref 30.0–36.0)
RDW: 12.6 % (ref 11.5–15.5)

## 2010-10-09 LAB — BASIC METABOLIC PANEL
Calcium: 9.5 mg/dL (ref 8.4–10.5)
Creatinine, Ser: 1 mg/dL (ref 0.4–1.2)
GFR calc Af Amer: >60 mL/min (ref >60)
GFR calc non Af Amer: 58 mL/min — ABNORMAL LOW (ref >60)
Glucose, Bld: 96 mg/dL (ref 70–99)
Sodium: 142 mEq/L (ref 135–145)

## 2010-10-09 LAB — PROTIME-INR
INR: 0.9 (ref 0.00–1.49)
Prothrombin Time: 12.2 seconds (ref 11.6–15.2)

## 2010-10-19 ENCOUNTER — Other Ambulatory Visit: Payer: Self-pay | Admitting: Cardiovascular Disease

## 2010-10-19 DIAGNOSIS — I739 Peripheral vascular disease, unspecified: Secondary | ICD-10-CM

## 2010-10-19 DIAGNOSIS — I714 Abdominal aortic aneurysm, without rupture: Secondary | ICD-10-CM

## 2010-10-20 ENCOUNTER — Encounter (INDEPENDENT_AMBULATORY_CARE_PROVIDER_SITE_OTHER): Payer: Medicare Other | Admitting: *Deleted

## 2010-10-20 ENCOUNTER — Encounter (INDEPENDENT_AMBULATORY_CARE_PROVIDER_SITE_OTHER): Payer: Medicare Other | Admitting: Cardiology

## 2010-10-20 DIAGNOSIS — I739 Peripheral vascular disease, unspecified: Secondary | ICD-10-CM

## 2010-10-20 DIAGNOSIS — I700 Atherosclerosis of aorta: Secondary | ICD-10-CM

## 2010-10-20 DIAGNOSIS — I714 Abdominal aortic aneurysm, without rupture: Secondary | ICD-10-CM

## 2010-10-23 ENCOUNTER — Encounter: Payer: Self-pay | Admitting: Cardiovascular Disease

## 2010-11-13 ENCOUNTER — Other Ambulatory Visit: Payer: Self-pay | Admitting: Cardiology

## 2010-11-14 NOTE — Discharge Summary (Signed)
[name], [name]              ACCOUNT NO.:  [id number]   MEDICAL RECORD NO.:  [id number]          PATIENT TYPE:  INP   LOCATION:  2006                         FACILITY:  [facility]   PHYSICIAN:  Ines Bloomer, M.D. DATE OF BIRTH:  [birth year]   DATE OF ADMISSION:  08/04/2007  DATE OF DISCHARGE:  08/09/2007                               DISCHARGE SUMMARY   HISTORY OF PRESENT ILLNESS:  The patient is a 55 year old female  referred to Dr. Edwyna Shell in thoracic surgical consultation for findings of  left cavitary lung nodules.  The patient has a known history of previous  treatment for non-Hodgkin's lymphoma.  For full details, please see the  History and Physical done at the time of admission.  She was admitted  this hospitalization for video-assisted thoracoscopy for biopsy of the  new lung nodules.   MEDICATIONS PRIOR TO ADMISSION:  1. OxyContin 20 mg t.i.d.  2. Oxycodone 5 mg 1 t.i.d.  3. Septra DS, dosage not listed in the History and Physical.  4. Coumadin 1-2 mg q.a.m.  5. Xanax 1 mg t.i.d.  6. Restasis eye drops.  7. Sustain eye drops.  8. Aciphex 20 mg q.a.m.  9. Crestor 40 mg nightly.  10.Isosorbide 30 mg q.a.m.  11.Lasix 40 mg p.r.n.  12.Folic acid 1 mg q.a.m.  13.Nitroglycerin p.r.n.  14.Aspirin 81 mg q.a.m.  15.Lopressor 25 mg 1/2 tablet b.i.d.  16.Hydrocodone 10/500 two p.r.n.  17.Zetia 10 mg nightly.   ALLERGIES:  No known drug allergies.   PAST MEDICAL HISTORY:  1. Non-Hodgkin's lymphoma, status post chemotherapy.  2. Bilateral pulmonary nodules, rule out opportunistic infection.  3. Hypercholesterolemia.  4. Coronary artery disease.  5. Tobacco abuse.  6. Hypertension.  7. Chronic obstructive pulmonary disease.   FAMILY HISTORY/SOCIAL HISTORY/REVIEW OF SYSTEMS/PHYSICAL EXAM:  Please  see the History and Physical done at the time of admission.   HOSPITAL COURSE:  Patient was admitted electively on August 04, 2007  and taken to the operating room at  which time she underwent a left video-  assisted thoracoscopy with minithoracotomy and wedge resection of the  left upper lobe and the left lower lobe.  Initial findings on frozen  showed inflammatory changes.  She was taken to the Post-Anesthesia Care  Unit in stable condition.   POSTOPERATIVE HOSPITAL COURSE:  The patient's course has been mostly  unremarkable.  She did have a postoperative anemia requiring  transfusion.  She has also been placed on iron.  Pain control has been  somewhat difficult, but she was managed with a PCA and then conversion  to oral therapy.  Her incision is healing without evidence of infection.  Pathology is currently pending.  She has been weaned from oxygen and  maintained adequate saturations on room air.  Her chest x-ray reveals a  steady improvement over time and has been monitored daily by Dr. Edwyna Shell.  All routines lines, monitors and drain devices have been discontinued in  the standard fashion.  Laboratory values do reveal the most recent  hemoglobin and hematocrit dated August 07, 2007 to be 8.3 and 23.9  respectively.  Electrolytes on the same date are as follows:  Sodium  133, potassium 3.5, chloride 99, carbon dioxide 31, BUN 8, creatinine  0.69, and she did receive potassium supplement.  Overall, upon morning-  round evaluation on August 09, 2007, the patient was felt to be stable  for discharge by Dr. Edwyna Shell.   CONDITION ON DISCHARGE:  Stable and improving.   MEDICATIONS ON DISCHARGE:  1. Niferex 150 mg daily.  2. Mucinex 1 twice daily.  3. Tylox 1-2 q.4-6 h. as needed.  4. Septra DS.  She can resume her previous schedule.  5. Coumadin 1-2 mg daily as on her previous schedule.  I believe this      is for her Port-A-Cath.  6. Xanax 1 mg t.i.d. p.r.n.  7. Restasis and Sustain eye drops as used previously.  8. Aciphex 20 mg daily.  9. Crestor 40 mg daily.  10.Isosorbide 30 mg daily.  11.Folic acid 1 mg daily.  12.Nitroglycerin sublingual  p.r.n.  13.Lasix 40 mg p.r.n. as used previously.  14.Aspirin 81 mg daily.  15.Metoprolol 25 mg 1/2 tablet twice daily.  16.Zetia 10 mg q.a.m.   FOLLOWUP:  The patient will be seen by Dr. Edwyna Shell next Thursday.  The  office will call with this appointment and also arrange a chest x-ray to  be done at that time.   FINAL DIAGNOSIS:  Cavitary lung nodules.  Pathology is currently pending  to the chart.   OTHER DIAGNOSES:  1. Postoperative acute blood-loss anemia.  2. History of non-Hodgkin's lymphoma.  3. History of hypercholesterolemia.  4. Coronary artery disease.  5. Tobacco abuse.  6. Hypertension.  7. Chronic obstructive pulmonary disease.      Rowe Clack, P.A.-C.      Ines Bloomer, M.D.  Electronically Signed    WEG/MEDQ  D:  08/09/2007  T:  08/11/2007  Job:  [id number]   cc:   Ines Bloomer, M.D.

## 2010-11-14 NOTE — Assessment & Plan Note (Signed)
[name]                            CARDIOLOGY OFFICE NOTE   NAME:[name], [name]                     MRN:          [id number]  DATE:10/13/2007                            DOB:          [birth year]    [name] is in for follow-up.  In general, she is stable.  She had not  been having any ongoing chest pain except for one little episode.  She,  unfortunately, has started to smoke again.  She recently underwent  pleural biopsy.  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  She had a lot of fibrosis and hemorrhage in the lung.  She has been  followed by Dr. Cleone [name] and Dr. Edwyna [name].  She has continued to make some  progress.  She has lost some weight, and she and I had a discussion  today again about the smoking.   MEDICATIONS:  1. Xanax 1 mg t.i.d.  2. Crestor 40 mg daily.  3. Folic acid 1 mg daily.  4. Aciphex 20 mg daily.  5. Methocarbamol p.r.n.  6. Restasis eye drops b.i.d.  7. Septra DS b.i.d. on Saturday and Sunday.  8. Aspirin 81 mg daily.  9. Metoprolol 25 mg 1/2 tablet b.i.d.  10.OxyContin 20 mg b.i.d.   On physical, she is somewhat emaciated.  The blood pressure is 100/70, the pulse is 60.  The weight is 113  pounds.  The lung fields reveal decreased breath sounds bilaterally.   Her EKG reveals sinus bradycardia with a nonspecific T-wave abnormality  with lateral T flattening.   IMPRESSION:  1. Coronary artery disease with prior inferior infarction.  2. Severe advanced peripheral vascular disease.  3. Continued tobacco use.  4. Significant chronic obstructive pulmonary disease, status post      Langerhans bronchiolitis findings by biopsy.  5. History of recurrent abdominal lymphoma, on chemotherapeutic      intervention.   PLAN:  1. Return to clinic in 4-6 months.  2. Continue current medical regimen.  3. Discussion again about basic lifestyle  habits.     Arturo Morton. Riley Kill, MD, [facility]  Electronically Signed    TDS/MedQ  DD: 10/13/2007  DT: 10/13/2007  Job #: [id number]   cc:   Lynett Fish, M.D.  Ines Bloomer, M.D.

## 2010-11-14 NOTE — Op Note (Signed)
[name], [name]              ACCOUNT NO.:  [id number]   MEDICAL RECORD NO.:  [id number]          PATIENT TYPE:  INP   LOCATION:  2867                         FACILITY:  [facility]   PHYSICIAN:  Ines Bloomer, M.D. DATE OF BIRTH:  1956/07/02   DATE OF PROCEDURE:  08/04/2007  DATE OF DISCHARGE:                               OPERATIVE REPORT   PREOPERATIVE DIAGNOSIS:  Status post chemotherapy for non-Hodgkin's  lymphoma with bilateral cavitary nodules.   POSTOPERATIVE DIAGNOSIS:  Status post chemotherapy for non-Hodgkin's  lymphoma with bilateral cavitary nodules.   OPERATION:  Right video assisted thoracoscopic surgery minithoracotomy,  wedge left upper lobe, left lower lobe lung.   SURGEON:  Dr. Patricia Nettle. Burney.   ANESTHESIA:  General anesthesia.   After percutaneous insertion of all monitoring lines, the patient  underwent general anesthesia was turned left lateral thoracotomy  position.  Prepped and draped in the usual sterile manner.  A dual-lumen  tube was inserted.  The left lung was deflated.  Two trocar sites were  made in the anterior and posterior axillary line at seventh intercostal  space.  Two trocars were inserted and insertion of 0 degrees scope we  could see that the patient had marked amount of adhesions of the left  lower lobe and left upper lobe.  These were taken down with a [employer]  ring forceps.  It was very inflamed when we took these down.  We then  made a 5 to 6 cm incision over the sixth intercostal space at the  anterior axillary line and was able to enter the space and palpate the  left upper lobe and left lower lobe, found to have some areas of  nodularity in the left upper lobe along the near the posterior segment  of the lingula.  This was resected with the EZ 45 stapler with several  applications; however, there was easy bleeding in the suture line that  had to be oversewn with 3-0 Vicryl.  In like manner near the superior  segment of left lower  lobe there are __________ areas of nodularity and  this was stapled.  Frozen section of the first staple line revealed  inflammatory changes but no definite cancer. Noted  the posterior staple  line of the left lower lobe staple line was the same.  Two chest tubes  were brought into the trocar sites.  CoSeal was applied to the staple  line.  All bleeding was electrocoagulated.  A Marcaine block was done in  the usual fashion.  __________ inserted in the usual fashion.  The chest  was closed two pericostals drilling through the seventh rib and passed  around the fifth rib, #1 Vicryl in muscle layer, 2-0 Vicryl subcutaneous  tissue and Dermabond for the skin.  The patient was turned to the  recovery room in serious condition.     Ines Bloomer, M.D.  Electronically Signed    DPB/MEDQ  D:  08/04/2007  T:  08/04/2007  Job:  [id number]   cc:   Lynett Fish, M.D.

## 2010-11-14 NOTE — H&P (Signed)
[name], [name]              ACCOUNT NO.:  [id number]   MEDICAL RECORD NO.:  [id number]          PATIENT TYPE:  INP   LOCATION:  NA                           FACILITY:  [facility]   PHYSICIAN:  Ines Bloomer, M.D. DATE OF BIRTH:  Feb 06, 1956   DATE OF ADMISSION:  DATE OF DISCHARGE:                              HISTORY & PHYSICAL   CHIEF COMPLAINT:  Nodules on lung.   HISTORY OF PRESENT ILLNESS:  This 55 year old patient had a low-grade  non-Hodgkin's lymphoma and was treated with a right paraspinal mass and  some right back pain and weakness.  She also had significant  degenerative disk and arthritis in her back.  She was treated with CHOP  and Rituxan  with a complete response and had the maintenance Rituxan  for several years.  In April of 2008, she had an L5-S1 diskectomy and  was also found to have at the C3-C4, that there was a suspected  recurrence of her __________ at T7.  She was again treated with Cytoxan  and Rituxan and had a second course in September.  A CT scan for  reevaluation showed multiple tiny nodules, cavitary, in both the left  and right lung suggestive of an opportunistic infection.  She has had no  hemoptysis, fever, chills, or excessive sputum.  CT scan right now shows  no recurrence of lymphoma.  She has been admitted for biopsies.   MEDICAL DECISION MAKING:  1. Restasis __________ eye drops.  2. Aciphex 20 mg daily.  3. Crestor 40 mg daily.  4. Isosorbide 30 mg daily.  5. Lasix 40 mg daily.  6. Nitroglycerin.  7. Aspirin.  8. Metoprolol 25 mg  daily.  9. Hydrocodone p.r.n.  10.Zetia 10 mg p.r.n.  11.OxyContin 20 mg twice a day.  12.Oxycodone p.r.n.  13.Septra DS.   ALLERGIES:  She has no allergies.   She has hypertension and hypercholesterolemia.  She had a heart attack  in September of 2008.  She has chronic obstructive pulmonary disease.   FAMILY HISTORY:  Positive for vascular disease and cardiac disease.   SOCIAL HISTORY:  [name]  married, single, has two children, retired and  smokes a pack of cigarettes per day.  Does not drink alcohol on a  regular basis.   REVIEW OF SYSTEMS:  She had weight loss, loss of appetite.  [name] 11  pounds.  [name] 5 foot, 4 inches.  CARDIAC:  She has a heart murmur,  angina.  She had some dyspnea and atrial fibrillation.  PULMONARY:  She  has had bronchitis and wheezing.  GI:  She has reflux, hiatal hernia and  constipation.  GU:  Frequent urination, vascular claudication, recent  history of some phlebitis, no TIAs.  NEUROLOGICAL:  Some dizziness.  MUSCULOSKELETAL:  Joint pain and rash.  PSYCHIATRIC:  Has been treated  for nervousness and depression.  EYE/ENT:  No change in her hearing and  recent decrease in her eyesight.  HEMATOLOGICAL:  No problems with  anemia, but is on Coumadin.   PHYSICAL EXAMINATION:  Her blood pressure is 130/80, pulse 80,  respirations 18, sats are 92%.  HEAD:  Atraumatic.  EYES:  Pupils are equal, reactive to light and accommodation.  Extraocular movements are normal.  EARS:  Tympanic membranes are intact.  NOSE:  There is no septal deviation.  NECK:  Supple without thyromegaly.  There is no supraclavicular or  axillary adenopathy.  CHEST:  Clear to auscultation and percussion.  HEART:  Regular sinus rhythm, no murmurs.  ABDOMEN:  Soft.  There is no hepatosplenomegaly.  EXTREMITIES:  Pulses are 2+.  There is no clubbing or edema.  NEUROLOGICAL:  [name] oriented x3.  Sensory and motor intact.  Cranial  nerves intact.   IMPRESSION:  1. Non-Hodgkin's lymphoma.  2. Status post chemotherapy for #1.  3. Bilateral pulmonary nodules, rule out opportunistic infection.  4. Hypercholesterolemia.  5. Coronary artery disease.  6. Tobacco abuse.  7. Hypertension.  8. Chronic obstructive pulmonary disease.      Ines Bloomer, M.D.  Electronically Signed     DPB/MEDQ  D:  07/31/2007  T:  08/01/2007  Job:  [id number]

## 2010-11-14 NOTE — Cardiovascular Report (Signed)
NAMESTANISLAWA, GAFFIN              ACCOUNT NO.:  [id number]   MEDICAL RECORD NO.:  [id number]          PATIENT TYPE:  AMB   LOCATION:  SDS                          FACILITY:  [facility]   PHYSICIAN:  Veverly Fells. Excell Seltzer, MD  DATE OF BIRTH:  07-Jul-1955   DATE OF PROCEDURE:  DATE OF DISCHARGE:  01/05/2009                            CARDIAC CATHETERIZATION   PROCEDURES:  1. Left heart catheterization.  2. Selective coronary angiography.  3. Abdominal aortography with runoff.  4. Abdominal aortic stent.   INDICATIONS:  Ms. Bryand is a 55 year old woman with multiple medical  problems who was evaluated on December 10, 2008 for severe lifestyle-  limiting claudication.  She described bilateral leg pain with walking at  short distance, left worse than right.  Her ABIs were 0.87 on the right  and 0.79 on the left at rest.  She had a noninvasive vascular study that  showed severely elevated velocities in the abdominal aorta above her  previously placed stent.  She also had chest pain and underwent a  Myoview stress scan that showed inferior ischemia.  She therefore was  referred for an abdominal aortogram with runoff as well as coronary  angiography.   Risks and indications of the procedure were reviewed with the patient,  informed consent was obtained, and 5-French sheath was placed in the  right femoral artery using the modified Seldinger technique.  5-French  coronary catheters were used for coronary angiography.  Pigtail catheter  was used to measure left ventricular pressures and perform a pullback  across the aortic valve.  The pigtail catheter was then brought down to  the abdominal aorta where an abdominal aortogram was performed under  digital subtraction.  A runoff to the feet was then performed using a  bolus-chase method, also under digital subtraction.  This demonstrated  severe stenosis of the infrarenal abdominal aorta just above her  previously placed stent and end-hole catheter was  advanced in order to  perform a pressure pullback.  There was a 10-20 mm resting gradient  present.  With nitroglycerin, there was a 40-mm pressure gradient.  Therefore, I elected to treat the vessel based on the patient's  symptoms, a severely elevated velocities on her visceral duplex, and  inducible pressure gradient by catheter pullback.  She was given 3500  units of heparin intravenously.  Her femoral sheath was upsized to an 8-  [place] bright tip sheath.  This was placed just inside of the right  common iliac stent.  The aorta was then stented with a 9 x 28 mm  OmniLink stent, which was deployed at 10 atmospheres.  The stent  appeared well expanded.  I took lateral views of the aorta following  stenting.  The stent appeared somewhat undersized.  I elected to  postdilate with a 10 x 20-mm Powersail balloon, which was taken to 12  atmospheres on two separate inflations to cover the entire stented  segment.  The patient tolerated the procedure well.  Final angiography  demonstrated a widely patent stent.  There were no immediate  complications.  The pigtail catheter and wire were removed  without  difficulty.  The sheath would be pulled and manual pressure was used for  hemostasis.  The patient's closing ACT was 276.   FINDINGS:  Coronary angiography:  Left main coronary artery is widely  patent.  It bifurcates into the LAD and left circumflex.   LAD:  The LAD is a large-caliber vessel that courses down and reaches  the LV apex.  There was no significant stenosis present.  There are two  small diagonal branches present.  The septal perforators of the LAD  supply collaterals to the distal right coronary artery.   Left circumflex:  The left circumflex is widely patent.  It supplies two  OM branches.  There was no significant stenosis throughout.   Right coronary artery:  The right coronary artery is subtotaled.  There  was a long diffuse severe stenosis throughout the midportion of  the  vessel.  Distally, the posterolateral branch and PDA fill from antegrade  flow as well as left-to-right collaterals.   Abdominal aortogram:  There are single renal arteries bilaterally.  The  left renal artery has a moderate proximal stenosis present.  The right  renal artery is widely patent.  The SMA is widely patent.  The distal  abdominal aorta has a severe eccentric stenosis present.  This is above  previously stented segment in the abdominal aorta.  There are stents  extending into both common iliac arteries.  There was a long stented  segment into the right common iliac and a short stented segment into the  left common iliac.  The indwelling stents in the distal aorta and common  iliac arteries are patent.   Right leg:  The right common internal and external iliac arteries are  patent.  There is mild disease at the sheath entry site in the common  femoral artery.  The SFA, deep femoral, popliteal, tibioperoneal trunk  are all patent.  There is three-vessel runoff to the right foot.   Left leg:  The common internal and external iliac arteries are all  patent.  The common femoral artery is patent.  The deep and superficial  femoral arteries are widely patent.  There is three-vessel runoff to the  foot.  The runoff to the left foot is very sluggish.   ASSESSMENT:  1. Severe single-vessel coronary artery disease with left-to-right      collaterals supplying the distal right coronary artery circulation.      This is stable from previous studies.  2. Severe infrarenal abdominal aortic stenosis with successful      stenting as outlined above.  3. Three-vessel runoff bilaterally.   RECOMMENDATIONS:  I recommend 30 days of dual-antiplatelet therapy with  aspirin and Plavix.  The patient should remain on long-term aspirin.      Veverly Fells. Excell Seltzer, MD  Electronically Signed     Veverly Fells. Excell Seltzer, MD  Electronically Signed    MDC/MEDQ  D:  01/05/2009  T:  01/06/2009   Job:  [id number]   cc:   Arturo Morton. Riley Kill, MD, [facility]  Lynett Fish, M.D.

## 2010-11-14 NOTE — Letter (Signed)
July 24, 2007   Lynett Fish, M.D.  [address] [place], [place] [address]   Re:  [name], [name]              DOB:  [birth year]   Dear Rocky Link,   I appreciate the opportunity to see [name].  This 55 year old  patient was found to have a low-grade non-Hodgkin's lymphoma and also,  when she initially presented with a right paraspinal mass, had some left-  sided back pain and weakness.  She also has severe significant  degenerative disc and arthritis of her back.  She was treated with CHOP  and rituximab with a complete response and has been put on maintenance  with rituximab for a period of several years.  She had an L5-S1  discectomy in April of 2008.  She also was found to have a C3-C4 disc  and was found to have repeat recurrence of her non-Hodgkin's lymphoma at  the T7 level.  She was again treated with Cytoxan, and rituximab and had  a second course in September.  She had a CT scan for re-evaluation,  which showed multiple tiny nodules, cavitary, in both the right and left  side, which is suggestive of some type of opportunistic infection.  She  had had no hemoptysis, fevers, chills, excessive sputum.  The rest of  her CT scan shows no evidence of recurrence of her lymphoma.  [name]  referred here for lung biopsy of the multiple small cavitary nodules.   MEDICATIONS INCLUDE:  1. Restasis and Sustain drops twice a day.  2. Aciphex 20 mg a day.  3. Crestor 40 mg daily.  4. Isosorbide 30 mg daily.  5. __________ 1 mg three times a day.  6. Lasix 40 mg daily.  7. Morphine 2 mg daily.  8. Folic acid.  9. Nitroglycerin.  10.Aspirin.  11.Metoprolol 25 mg daily.  12.Hydrocodone p.r.n.  13.Zetia 10 mg daily.  14.OxyContin 20 mg twice a day.  15.Oxycodone p.r.n.  16.Septra-DS.   ALLERGIES:  She has no known allergies.   She has hypertension, hypercholesterolemia.  She had a previous heart  attack in September 2008.  She also has chronic obstructive pulmonary  disease.   FAMILY HISTORY:  Positive for vascular, as well as cardiac disease.   SOCIAL HISTORY:  [name] single, has two children, retired, smokes one  pack a day.  Does not drink alcohol on a regular basis.   REVIEW OF SYSTEMS:  She has had weight-loss, loss of appetite.  [name] 115 pounds.  [name] 5 feet 4.  CARDIAC:  She has a heart murmur, angina, atrial fibrillation and  dyspnea.  PULMONARY:  She has been treated for bronchitis and wheezing.  GI:  She has reflux, hiatal hernia and constipation.  GU:  She has frequent urination.  VASCULAR:  She has claudication.  She has recent history of some  phlebitis.  No TIA's.  NEUROLOGIC:  She has had some dizziness.  MUSCULOSKELETAL:  She has joint pain and muscular pain.  PSYCHIATRIC:  She has been treated for nervousness and depression.  ENT:  She has no change in her hair and recent decrease in her eyesight.  HEMATOLOGICAL:  She has no problems with anemia, but is on Coumadin.   PHYSICAL EXAMINATION:  Her blood pressure is 130/80, pulse 80,  respirations 18 and sats are 92%.  HEAD, EYES, EARS, NOSE AND THROAT are  unremarkable.  NECK:  The neck is supple  without thyromegaly.  There is  no supraclavicular or axillary adenopathy.  CHEST:  Clear to  auscultation and percussion. HEART:  Regular sinus rhythm, no murmurs.  ABDOMEN:  Soft.  There is no hepatosplenomegaly.  PULSES are 2+.  There  is no clubbing or edema.  NEUROLOGICAL:  [name] oriented times three.  Sensory and motor intact.  Cranial nerves are intact.   I feel that she has some type of probable infection.  This more than  likely is a fungus or some other type of opportunistic infection.  I  plan to do a lung biopsy at [facility] on February 2.  The risks of  the procedure have been explained to the patient and she understands all  the risks and agrees with the procedure.   I appreciate the opportunity of seeing [name].   Ines Bloomer, M.D.  Electronically Signed    DPB/MEDQ  D:  07/24/2007  T:  07/25/2007  Job:  [id number]

## 2010-11-14 NOTE — Assessment & Plan Note (Signed)
[facility]                            CARDIOLOGY OFFICE NOTE   NAME:Cleckler, LEMON WHITACRE                     MRN:          [id number]  DATE:07/10/2007                            DOB:          03/10/1956    Ms. Vallin is in for followup.  [name] stable from a cardiac standpoint.  [name] worried about getting her carotid radiated.  She has had  recurrent lymphoma, now has lesions in the spine, pelvis and neck.  Her  blood counts have not been good enough to consider for chemotherapy and,  therefore, radiation has been recommended.  She did have Dopplers done  today.  Carotids reveal 0-39% bilateral ICA stenosis.  There is no flow  detected in the left vertebral, and this has been noted previously.  She  has not been having any ongoing chest pain.  She has tried to keep her  spirits up.   Currently her weight is 114 pounds.  Blood pressure 124/71, pulse 63.  She has decreased breath sounds bilaterally.  CARDIAC:  Rhythm is regular.   EKG today reveals normal sinus rhythm essentially within normal limits.  Preliminary carotid studies are as noted.   IMPRESSION:  1. Coronary artery disease with prior inferior infarction.  2. Advanced peripheral vascular disease.  3. History of tobacco use.  4. History of severe chronic obstructive pulmonary disease.  5. Recurrent abdominal lymphoma, currently not candidate for      chemotherapy to get radiation.   I have tried to reassure her and suggested that she talk with the  radiation oncologist about the options.  This may be necessary for local  reasons in the neck, I have encouraged her to have a positive attitude  about it.  We will see her back in 3 to 6 months.     Arturo Morton. Riley Kill, MD, [facility]  Electronically Signed    TDS/MedQ  DD: 07/10/2007  DT: 07/10/2007  Job #: [id number]   cc:   Lynett Fish, M.D.

## 2010-11-14 NOTE — Assessment & Plan Note (Signed)
[facility]                            CARDIOLOGY OFFICE NOTE   NAME:[name], [name]                     MRN:          [id number]  DATE:04/05/2008                            DOB:          04-19-1956    [name] is in for a followup visit.  [name] continuing to smoke about 5  cigarettes a day.  She has had problems with recurrent infection and she  has had a partial lung resection recently.  She saw Dr. Edwyna Shell and he  stopped her aspirin.  Because of the smoking, she has restarted herself  on Wellbutrin.  She has not had a lot in the way of chest discomfort.   MEDICATIONS:  1. Septra DS 1 p.o. b.i.d.  2. Metoprolol 25 mg 1/2 tablet b.i.d.  3. OxyContin 20 mg t.i.d.  4. Fish oil.  5. B12.  6. Prednisone 20 mg daily.  7. Ventolin inhaler.  8. Tussionex.   ADDITIONAL MEDICATIONS:  1. Xanax 1 mg t.i.d.  2. Crestor 40 mg daily.  3. Folic acid 1 mg daily.  4. Aciphex 20 mg daily.  5. Methocarbamol 750 mg p.r.n.  6. Restasis eye drops b.i.d.   PHYSICAL EXAMINATION:  She weighs 113 pounds.  She appears chronically  ill.  Her skin is sallow.  The lung fields reveal prolonged expiration  with diffuse rhonchi.  Her cardiac rhythm is regular.  There is evidence  of vascular bruits in the upper chest.  Her extremities do not reveal  currently significant edema.   The electrocardiogram demonstrates normal sinus rhythm with short PR and  otherwise normal tracing.   IMPRESSION:  1. Coronary artery disease status post stenting of the aortic and      iliac vessels by Dr. Allyson Sabal.  2. Coronary artery disease with occlusion of the right coronary artery      filled by collaterals from the left coronary system.  3. History of left ventricular dysfunction with ejection fraction of      35% range.  4. History of carotid cerebrovascular disease with 0-39% bilateral      internal carotid artery stenosis with no flow detected in the left      vertebral artery and  the left subclavian disease without definite      symptoms.  5. History of non-Hodgkin's lymphoma.  6. Chronic back pain status post lumbar surgery.  7. Hyperlipidemia.  8. Hypertension.  9. Status post lung resection.  10.Chronic obstructive pulmonary disease.  11.Continue current medical regimen cardiac-wise.  12.Return to clinic in 6 months.     Arturo Morton. Riley Kill, MD, [facility]  Electronically Signed    TDS/MedQ  DD: 04/07/2008  DT: 04/08/2008  Job #: [id number]   cc:   Lynett Fish, M.D.

## 2010-11-14 NOTE — Letter (Signed)
August 14, 2007   Lynett Fish, M.D.  [address] [place], [place] [address]   Re:  LASHONA, SCHAAF              DOB:  June 29, 1956   Dear Rocky Link:   I saw Ms. Dragan back today after pleural biopsies.  Her chest x-ray  showed decrease in pleural and parenchymal opacities in the left lower  lobe.  Her incisions are well healed after her lung biopsy.  Removed her  chest tube sutures.  Her pain has decreased, and also we had to give her  a refill for her Percocet.  The biopsy came back from Dr. Adrienne Mocha in  [place], and I sent you a copy of this.  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  I did talk to her about stopping smoking today, and she  thought that there were areas of cystic changes in the lungs.  Her lung  was very diseased with both fibrosis and hemorrhage in it, but we did  not find any evidence of infection or recurrence of her cancer.  We told  her to gradually increase her activities and I would see.  Her blood  pressure was 116/71, pulse 84, respirations 18.  Saturations were 94%.  I plan to see her back again in two weeks with a chest x-ray.   Ines Bloomer, M.D.  Electronically Signed   DPB/MEDQ  D:  08/14/2007  T:  08/15/2007  Job:  [id number]

## 2010-11-17 NOTE — Group Therapy Note (Signed)
[name], [name]              ACCOUNT NO.:  [id number]   MEDICAL RECORD NO.:  [id number]          PATIENT TYPE:  INP   LOCATION:  A204                          FACILITY:  [facility]   PHYSICIAN:  Kofi A. Gerilyn Pilgrim, M.D. DATE OF BIRTH:  [birth year]   DATE OF PROCEDURE:  DATE OF DISCHARGE:                                   PROGRESS NOTE   The patient is more lucid today, and we are able to get a better history.  She apparently ran out of several of her medications before being admitted  to the hospital.  She apparently did not get them filled for a few days.  She was out of Lasix, Norvasc, Crestor, and Xanax for about 2-3 days.  She  stated that she was tremulous before becoming ill.   MRI with scan does not show any acute process.  It was done with contrast.  There are some chronic ischemic white matter changes.  Currently, the  patient is awake and alert.  [name] lucid and coherent with no focal  deficits.   ASSESSMENT/PLAN:  Toxic metabolic encephalopathy, multifactorial in  etiology.  She does have pseudoseizures, but it is unclear at this time if  these are related to withdrawal as opposed to unprovoked seizures.  She does  not clinically meet the criteria for epilepsy, and therefore it is unclear  if she will need long-term medications.  We will have her come back to the  office for an EEG to help [place] figure that out.      KAD/MEDQ  D:  10/20/2004  T:  10/20/2004  Job:  [id number]

## 2010-11-17 NOTE — Group Therapy Note (Signed)
NAMEANTWONETTE, FELIZ              ACCOUNT NO.:  [id number]   MEDICAL RECORD NO.:  [id number]          PATIENT TYPE:  INP   LOCATION:  A336                          FACILITY:  [facility]   PHYSICIAN:  Edward L. Juanetta Gosling, M.D.DATE OF BIRTH:  August 12, 1955   DATE OF PROCEDURE:  DATE OF DISCHARGE:                                   PROGRESS NOTE   PROBLEM:  Pneumonia.   SUBJECTIVE:  Mrs. Brummitt says she feels great and wants to go home. [name]  coughing up sputum. [name] not short of breath. [name] having no chest pain.   Her physical exam shows that her temperature is 98, pulse is 80,  respirations 20, blood pressure 105/62, and her O2 sat is 97% on 1 liter.   ASSESSMENT:  [name] much improved.   Plan is for her to be discharged home. Please see the discharge summary for  details.      Edward L. Juanetta Gosling, M.D.  Electronically Signed     ELH/MEDQ  D:  04/18/2005  T:  04/18/2005  Job:  [id number]

## 2010-11-17 NOTE — H&P (Signed)
NAMEJUANITTA, EARNHARDT              ACCOUNT NO.:  [id number]   MEDICAL RECORD NO.:  [id number]          PATIENT TYPE:  INP   LOCATION:  A204                          FACILITY:  [facility]   PHYSICIAN:  Edward L. Juanetta Gosling, M.D.DATE OF BIRTH:  Apr 28, 1956   DATE OF ADMISSION:  10/18/2004  DATE OF DISCHARGE:  LH                                HISTORY & PHYSICAL   REASON FOR ADMISSION:  Change in mental status.   HISTORY:  Ms. Nass is a 55 year old who has a long known history of  multiple medical problems including COPD, hypertension, previous peripheral  vascular stent, history of a lymphoma which has been treated with CHOP  therapy.  She was found to be confused at home, had what appeared to be a  seizure and she was brought to the emergency room.  Here in the emergency  room she had what appeared to be another seizure and had CT scan which is  negative acutely, no other definite cause of her problem but she has been on  Xanax and Vicodin, it is not clear how much of those she is taking now and  she may be having some withdrawal although her drug screen is positive for  both at this point.   PAST MEDICAL HISTORY:  Her past medical history is very positive for  multiple medical problems including a previous history of peripheral  vascular disease, coronary artery occlusive disease with a total right  occlusion, a stenting of the aorta and iliac vessels in the past, the  history of lymphoma, history of depression.  She has also had back surgery  on several occasions.   MEDICATIONS:  Her medications at home are Xanax (I am not sure of the dose),  Vicodin (I am not sure of the dose), Wellbutrin which of course can cause  seizures so we need to get her off of that, isosorbide mononitrate (do not  know the dose), metoprolol tartrate (I do not know the dose), Coumadin (dose  unknown), folic acid (dose unknown), Lasix (dose unknown) and nitroglycerin.   SOCIAL HISTORY:  She smokes about a  package of cigarettes daily.  [name]  unemployed.   FAMILY HISTORY:  Her family history is positive for COPD.   PHYSICAL EXAMINATION:  Her physical exam shows a confused well-developed  female.  Her blood pressure 140/89, pulse 63, respirations 24, O2 saturation  is 94%, temperature is 97.9.  Pupils are reactive.  Nose and throat are  clear.  [name] still confused.  Her neck is supple without masses, bruits or  jugular venous distention.  Her heart is regular without gallop.  Abdomen  soft, no masses are felt.  Chest clear with rhonchi.  Her extremities showed  no edema.  CNS shows that she is confused.   LABORATORY WORK:  Her laboratory work is without a definite cause of a  problem and CT scan shows no acute changes.   ASSESSMENT AND PLAN:  My assessment is that she is having seizures whether  it be related to withdrawal from medication or whether it be related perhaps  to Wellbutrin or some other cause.  It does not appear that she has any sort  of metastatic disease to the brain.  I am going to go ahead and admit her,  have neurochecks, ask for neurology consultation, put her on Dilantin and  follow.      ELH/MEDQ  D:  10/18/2004  T:  10/18/2004  Job:  [id number]

## 2010-11-17 NOTE — Group Therapy Note (Signed)
Tina, [name]              ACCOUNT NO.:  [id number]   MEDICAL RECORD NO.:  [id number]          PATIENT TYPE:  INP   LOCATION:  A204                          FACILITY:  [facility]   PHYSICIAN:  Edward L. Juanetta Gosling, M.D.DATE OF BIRTH:  [birth year]   DATE OF PROCEDURE:  10/20/2004  DATE OF DISCHARGE:                                   PROGRESS NOTE   PROBLEM:  Multifactorial encephalopathy.   SUBJECTIVE:  [name] is much more alert and awake this morning. [name]  oriented. [name] moving all fours, and she has no new complaints. She does  say that she has a pressure sensation in her urine. Her urine looks very  cloudy. She has a Foley catheter in. Her O2 saturation was 94% on room air,  so I do not think hypoxemia is part of her problem. She said that she got  the MRI done yesterday. I do not have a report yet.   OBJECTIVE:  VITAL SIGNS:  Her physical exam shows that her temperature is  97.6, pulse 71, respirations 20, blood pressure 132/77. O2 saturation as  mentioned 94%.  CHEST:  Her chest is clear.  HEART:  Her heart is regular.  ABDOMEN:  Her abdomen is soft.  GENERAL:  She looks comfortable.  CENTRAL NERVOUS SYSTEM:  Much more intact.   Dilantin level yesterday as mentioned was 12.6.   ASSESSMENT:  [name] much improved.   PLAN:  I am going to go ahead and get a urine from the catheter and then  discontinue the Foley catheter. Dr. Ronal Fear help is appreciated. I will  try to get the MRI report. See if she can get the EEG. Depending on all of  the results of the above, she may be able to be discharged. One of the  questions is going to be whether she is going to need chronic Dilantin  therapy at home, and I am going to switch her Dilantin to by mouth since she  is much more alert.      ELH/MEDQ  D:  10/20/2004  T:  10/20/2004  Job:  [id number]

## 2010-11-17 NOTE — Assessment & Plan Note (Signed)
[facility]                            CARDIOLOGY OFFICE NOTE   NAME:[name], [name]                     MRN:          [id number]  DATE:09/03/2006                            DOB:          08-04-1955    [name] is a 55 year old white female patient who is needing back  surgery, and because of her insurance, needs to be done with the [facility].  She has history of coronary artery disease with most  recent cardiac catheterization done in 2005, which showed a chronic  total occlusion of the RCA, which is unfavorable for cutaneous coronary  intervention.  Her LAD and circumflex are with no significant disease.  Her ejection fraction was 35% with akinesis in the inferior wall.  Distal aortogram showed 30% left renal artery, 60% narrowing in the  aorta just above the stent.  There was stent in the distal aorta  extending into both iliacs.  The femoral artery was narrowed just above  the sheath, but was not well visualized.  [name] also has had  balloon angioplasty of the left iliac for end stent restenosis, and a  stent placement in the common right iliac in 2004.  She also has left  subclavian stenosis with a 20-point blood pressure differential between  arms, and has had history of blackouts, none recently.  She also has  suffered from non-Hodgkin's lymphoma, and is treated by Dr. Cleone Slim.  She  finished chemotherapy in December, but still has a port-a-cath, and is  on Coumadin for this.  She continues to smoke at least 1 pack per day of  cigarettes.   The patient recently had 2 episodes of chest pain that were fleeting.  She said it felt like a pressure across her chest.  She feels it was  related to stress.  Both her son and daughter were in different  hospitals, one with a motor vehicle accident, and the other was in  [facility].  She has not had anymore chest tightness.  [name] not  active because of her chronic back pain and  immobility.  She has some  dyspnea on exertion, but denies exertional chest tightness.   ALLERGIES:  NO KNOWN DRUG ALLERGIES.   CURRENT MEDICATIONS:  1. Xanax 1 mg t.i.d.  2. Metoprolol 50 mg 1/2 b.i.d.  3. Coumadin as directed, followed by Dr. Cleone Slim.  4. Imdur 30 mg daily.  5. Crestor 40 mg daily.  6. Folic acid 1 mg daily.  7. AcipHex 20 mg daily.  8. Zetia 10 mg daily.  9. Aspirin 81 mg daily.  10.Biotin 500 mg 2 daily.   PAST MEDICAL HISTORY:  Please see above dictation, as well as:  1. Hypertension.  2. Hyperlipidemia.  3. Carotid Dopplers.   SOCIAL HISTORY:  [name] very anxious.  She continues to smoke over a  pack of cigarettes a day.   PHYSICAL EXAMINATION:  This is an anxious, 55 year old, white female, in  no acute distress.  Blood pressure 110/70.  Pulse 74.  Weight 128.12.  NECK:  Without JVD or HDR.  She does  have a high-pitched left carotid  bruit.  LUNGS:  Decreased breath sounds, but clear, anterior, posterior and  lateral.  HEART:  Regular rate and rhythm at 74 beats per minute.  Normal S1 and  S2.  Positive S4 with a 1/6 systolic ejection murmur left sternal  border.  ABDOMEN:  Soft without organomegaly, masses, lesions, or abnormal  tenderness.  EXTREMITIES:  Without cyanosis, clubbing or edema.  She has decreased  distal pulses, but they are present.  EKG:  Normal sinus rhythm.  Nonspecific ST, T wave changes.  No acute  change on EKG.   IMPRESSION:  1. Chronic back pain.  Need for surgery.  2. Coronary artery disease with total occlusion of the right coronary      artery on cardiac catheterization in 2005 with left ventricular      dysfunction.  Ejection fraction 35%.  Normal left anterior      descending and circumflex.  Right coronary artery not amendable to      percutaneous transluminal coronary angioplasty.  3. Widespread vascular disease, status post angioplasty of the left      iliac artery for end stent restenosis and a stent in the  common      right iliac artery.  4. Left subclavian stenosis with 20-point blood pressure differential      between arms.  Appears asymptomatic.  5. Non-Hodgkin's lymphoma followed by Dr. Cleone Slim.  Chemo finished in      December 2007.  6. Dyslipidemia.  7. Ongoing tobacco abuse.  8. Hypertension.   PLAN:  We have encouraged the patient to quit smoking.  She recently had  Dopplers here on August 28, 2006, which show stable mild calcific  plaque bilaterally.  No flow in the left vertebral artery, and 40% to  59% bilateral iliac carotid artery stenosis.  We recommend she  have an adenosine Cardiolite, and if this is negative, she will be  reasonable risk to undergo back surgery.      Jacolyn Reedy, PA-C  Electronically Signed      Arturo Morton. Riley Kill, MD, [facility]  Electronically Signed   ML/MedQ  DD: 09/03/2006  DT: 09/03/2006  Job #: [id number]   cc:   Doran Clay. Juanetta Gosling, M.D.

## 2010-11-17 NOTE — Assessment & Plan Note (Signed)
[facility]                            CARDIOLOGY OFFICE NOTE   NAME:[name], [name]                     MRN:          [id number]  DATE:09/17/2006                            DOB:          [birth year]    I called [name] regarding her radionuclide imaging study. She needs  back surgery. She had a followup radionuclide imaging study which  demonstrated a mild inferior defect with mild ischemia in the inferior  wall. The defect was less pronounced and the ejection fraction was  improved from the study of 2005. She has not had intercurrent problems.  From a clinical standpoint she has continued to remain stable and  therefore should be potentially stable for surgery. During her last  surgery she had pulmonary problems after the surgery and she does  continue to smoke. I told her that leading into the surgery she would be  advised to discontinue smoking for a minimum of 3 to 4 weeks prior to  the surgery. She has had a very difficult time with this, and has been  tried on Chantix. I think that this is probably 1 of the limiting  factors of her surgery potentially. I have encouraged her to let us know  when the surgery is going to be.     Arturo Morton. Riley Kill, MD, [facility]  Electronically Signed    TDS/MedQ  DD: 09/17/2006  DT: 09/18/2006  Job #: [id number]   cc:   Ramon Dredge L. Juanetta Gosling, M.D.

## 2010-11-17 NOTE — Assessment & Plan Note (Signed)
[facility]                              CARDIOLOGY OFFICE NOTE   NAME:[name], Tina DOMINGOS                     MRN:          [id number]  DATE:04/12/2006                            DOB:          05/22/1956    [name] is in for a followup visit.  She has recently been diagnosed with  mycoplasma, and has been placed on doxycycline.  She has developed a little  bit of a rash, and has put in a call to Dr. Juanetta Gosling to discuss the  medication.  Overall, however, the patient has gotten along reasonably well  from a cardiac standpoint.  She gets a little lightheaded when she stands.  She continues to smoke, unfortunately.   PHYSICAL EXAMINATION:  VITAL SIGNS:  The blood pressure is 140/70 supine,  with standing it is about 110 systolic.  LUNGS:  The lung fields reveal decreased breath sounds bilaterally.  CARDIAC:  The cardiac exam is unchanged.   The patient's EKG reveals sinus bradycardia with a rightward axis and  prominent T waves consisting of P pulmonale.   [name]'s situation is complicated.  She has advanced vascular disease, and  is unable to stop smoking.  We have encouraged her in this regard.  I think  she has some early orthostatic hypotension, and I have encouraged her to  drink non-caffeinated fluids, which have been a problem.  [name] going to  call Dr. Juanetta Gosling' office to discuss the mild macular papular diffuse rash  that she has with her doxycycline.  She will return to see [place] in followup in  about 6 months.       Arturo Morton. Riley Kill, MD, [facility]     TDS/MedQ  DD:  04/12/2006  DT:  04/14/2006  Job #:  [id number]   cc:   Ramon Dredge L. Juanetta Gosling, M.D.

## 2010-11-17 NOTE — Group Therapy Note (Signed)
NAMETIMICA, MARCOM              ACCOUNT NO.:  [id number]   MEDICAL RECORD NO.:  [id number]          PATIENT TYPE:  INP   LOCATION:  A204                          FACILITY:  [facility]   PHYSICIAN:  Edward L. Juanetta Gosling, M.D.DATE OF BIRTH:  [birth year]   DATE OF PROCEDURE:  10/18/2004  DATE OF DISCHARGE:                                   PROGRESS NOTE   PROBLEM:  Seizures.   SUBJECTIVE:  Ms. Garciagarcia continues mildly confused this morning.  She has had  some visual hallucinations during the night.   OBJECTIVE:  Her exam shows her temperature is 97.3, pulse of 64,  respirations 18 and blood pressure 127/68; her chest is fairly clear; her  heart is regular; her abdomen is soft.   Her Dilantin level is 12.6.   ASSESSMENT AND PLAN:  I have asked for her to have a consultation with Dr.  Gerilyn Pilgrim and I have asked for a MRI of the brain and EEG.  Her PO2 was low  on admission, then she was put on oxygen and it went up.  I am going to see  where she is on room air now; if she is hypoxic, that may be part of the  problem.      ELH/MEDQ  D:  10/19/2004  T:  10/19/2004  Job:  [id number]

## 2010-11-17 NOTE — Procedures (Signed)
   [name], [name]                        ACCOUNT NO.:  [id number]   MEDICAL RECORD NO.:  [id number]                   PATIENT TYPE:  OUT   LOCATION:  RAD                                  FACILITY:  [facility]   PHYSICIAN:  Thomas C. Wall, M.D. [facility]            DATE OF BIRTH:  27-Oct-1955   DATE OF PROCEDURE:  DATE OF DISCHARGE:                                    STRESS TEST   BRIEF HISTORY:  The patient is a pleasant 55 year old female with a history  of coronary artery disease.  She underwent cardiac catheterization in April  2002 and was found to have two 40% lesions of the right coronary artery with  a normal LV EF 60%.  She has a history of a 50% renal artery stenosis.  She  has a previous iliac stent.  She has peripheral vascular disease, elevated  lipids, and COPD.  Recently, she has been having some chest pain.  She was  scheduled for a Cardiolite to further evaluate her symptoms.   Prior to the study the patient had no complaints of chest pain.  Her  baseline EKG showed sinus rhythm, rate 67 beats per minute without ischemia.  There were diffuse inverted P-waves.  Her blood pressure was 130/74.   Adenosine was administered minutes one through four.  Cardiolite was given  at three minutes.  The patient experienced a sensation as if her throat was  closing up.  She also had shortness of breath, chest pain, headache, and  abdominal discomfort.  The EKG showed no significant changes.  Her symptoms  resolved in recovery.  The images are pending at time of this dictation.     Delton See, P.A. [facility]                  Thomas C. Daleen Squibb, M.D. [facility]    DR/MEDQ  D:  04/16/2002  T:  04/17/2002  Job:  [id number]   cc:   Ramon Dredge L. Juanetta Gosling, M.D.

## 2010-11-17 NOTE — Consult Note (Signed)
[name], [name]              ACCOUNT NO.:  [id number]   MEDICAL RECORD NO.:  [id number]          PATIENT TYPE:  INP   LOCATION:  A204                          FACILITY:  [facility]   PHYSICIAN:  Kofi A. Gerilyn Pilgrim, M.D. DATE OF BIRTH:  10-16-1955   DATE OF CONSULTATION:  DATE OF DISCHARGE:                                   CONSULTATION   IMPRESSION:  Multifactorial encephalopathy.  The etiologies include  seizures, postictal confusion, medication effect, and urinary tract  infection.   RECOMMENDATIONS:  She has an MRI of the brain ordered, which I think is  appropriate, given that she has a history of lymphoma.  We are also going to  add an EEG.  We will also attempt to obtain a complete list of her previous  medications.   HISTORY:  This is a 55 year old Caucasian female who has multiple medical  problems, including coronary artery disease.  There is a history of lymphoma  that apparently has been in remission.  [name] on multiple different  medications and was noted to have a relatively acute change in mental status  with confusion.  She had a witnessed grand mal seizure.  She was to the  emergency room, where she was noticed to have another seizure.  She was  given Dilantin.  Patient has been on Xanax and Vicodin.  There is some  concern that she may be off her medication, but it is not clear.  Her total  medication list is not available at this time, but she is on Wellbutrin.   PAST MEDICAL HISTORY:  Coronary artery disease, peripheral vascular disease.  She has had stenting of the right aorta and iliac vessels with a history of  lymphoma, depression, and back surgery.  She had this apparently because of  the back pain.   Medications that she is known to be on at admission include Xanax, Vicodin,  Wellbutrin, metoprolol, Coumadin, folic acid, Lasix, and isosorbide.   SOCIAL HISTORY:  She smokes about a pack of cigarettes per day.  [name]  employed.   FAMILY HISTORY:   Significant for COPD.   REVIEW OF SYSTEMS:  Not obtainable because of the mentation, but the patient  apparently has had hallucinations today.  They have been all visual  hallucinations.  She has been given Haldol.  Attempts to do an MRI scan were  unsuccessful because of irritation and not being able to remain still.   There is no family history of seizures.   PHYSICAL EXAMINATION:  VITAL SIGNS:  The patient has been afebrile since  being in the hospital.  Current temperature is 97.6, pulse 57, respirations  18, blood pressure 123/71.  NECK:  Supple.  NEUROLOGIC:  The patient lies in the bed with eyes closed.  She requires  sternal stimulation to arouse.  She apparently was just given medication for  a second attempt at an MRI scan.  She does arouse and is oriented to person  and place; however, she thinks it is 52, but she does know that the  President is Bush.  She dozes off easily unless  she is stimulated.  She does  follow commands bilaterally.  The motor examination shows antigravity  strength in all four extremities, the exact grade is not known but at least  a 4/5.  No pronator drift is noted.  [name] noted to have some asterixis,  especially over the left upper extremity.  Coordination seems unremarkable.  Reflexes are symmetric and normal.  Plantar reflexes are both downgoing.   Initial CT scan is negative.   Initial blood gas shows pH 7.3, pCO2 59, pO2 44, saturation 82%.  On repeat,  pH 7.3, pCO2 47, pO2 97, saturation 97%.  FiO2 is unknown.  WBC 12,  hemoglobin 13, platelet count 175.  Sodium 140, potassium 3.3, chloride 105,  CO2 28, glucose 138, BUN 6, creatinine 0.8.  Liver enzymes unremarkable.  Urinalysis is positive for nitrites, many bacteria, epithelial cells rare,  leukocyte esterase negative.  Alcohol level:  None detected.  Phenytoin  level 12.6, apparently after she was loaded.  Urine drug screen is positive  for benzodiazepines and opioids, both of which  she is taking.   Thank you for this consultation.      KAD/MEDQ  D:  10/19/2004  T:  10/19/2004  Job:  [id number]

## 2010-11-17 NOTE — Op Note (Signed)
[name], [name]              ACCOUNT NO.:  [id number]   MEDICAL RECORD NO.:  [id number]          PATIENT TYPE:  OIB   LOCATION:  3003                         FACILITY:  [facility]   PHYSICIAN:  Reinaldo Meeker, M.D. DATE OF BIRTH:  [birth year]   DATE OF PROCEDURE:  05/09/2004  DATE OF DISCHARGE:                                 OPERATIVE REPORT   PREOPERATIVE DIAGNOSIS:  Herniated disc L5-S1 left.   POSTOPERATIVE DIAGNOSIS:  Herniated disc L5-S1 left.   PROCEDURES:  1.  Left L5-S1 intralaminar laminotomy for excision of herniated disc with      operative microscope.  2.  Microdissection L5-S1 disc and S1 nerve root.   SURGEON:  Reinaldo Meeker, M.D.   ASSISTANT:  Kathaleen Maser. Pool, M.D.   DESCRIPTION OF PROCEDURE:  After being placed in prone position, the  patient's back was prepped and draped in the usual sterile fashion.  Localizing x-rays taken prior to incision to identify the appropriate level.  A midline incision was made above the spinous processes of L5 and S2.  Using  __________ current the incision was carried down the spinous processes.  Subperiosteal dissection was then carried out only incising the spinous  processes and lamina.  McCullough self-retaining retractor was placed for  exposure.  Second x-ray was taken to confirm approach to the appropriate  level and this was correct.  Using high speed drill, the inferior one third  of the L5 lamina, the medial one third of the facet joint removed.  Central  one third of the S1 lamina was then removed.  Residual bone and ligamentum  flavum removed in piecemeal fashion.  Microscope was draped, brought into  the field and used for the remainder of the case.  Using microdissection  technique, the lateral aspect of the thecal sac and S1 nerve were  identified.  Further coagulation was carried out down the floor of the canal  to identify the L5-S1 disc which was found remarkably herniated.  After  coagulating on the annulus,  the annulus was incised with #15 blade.  Using  pituitary rongeurs and curettes, thorough disc space clean out was carried  out.  While at the same time,  great care was taken to avoid injury to the  neural elements.  This was successfully done.  At this point, inspection was  carried out in all directions for any evidence of residual compression, none  could be identified.  Large amounts of irrigation were carried out and any  bleeding controlled with bipolar coagulation and Gelfoam.  The wound was  then closed using interrupted Vicryl in the muscle, fascia, subcutaneous and  subcuticular tissues and staples on the skin.  A sterile dressing was then  applied and the patient was extubated and taken to the recovery room in  stable condition.       ROK/MEDQ  D:  05/09/2004  T:  05/09/2004  Job:  [id number]

## 2010-11-17 NOTE — Group Therapy Note (Signed)
NAMETEVA, BRONKEMA              ACCOUNT NO.:  [id number]   MEDICAL RECORD NO.:  [id number]          PATIENT TYPE:  INP   LOCATION:  A336                          FACILITY:  [facility]   PHYSICIAN:  Edward L. Juanetta Gosling, M.D.DATE OF BIRTH:  09-Jun-1956   DATE OF PROCEDURE:  04/16/2005  DATE OF DISCHARGE:                                   PROGRESS NOTE   SUBJECTIVE:  Ms. Trang was admitted yesterday by Dr. Ouida Sills with pneumonia.  She also has COPD, peripheral vascular disease, a history of lymphoma.  She  says she is doing better.  She has no other new complaints.  She still  coughing and still short of breath.  She wants to know about the use of the  nicotine patch which I have encouraged her to continue use since she is  continuing to smoke.   OBJECTIVE:  VITAL SIGNS:  Temperature is 98, pulse 71, respirations 20,  blood pressure 112/59, O2 saturations 98% on 2 L.  CHEST:  Her chest is actually fairly clear with decreased breath sounds.  HEART:  Her heart is regular.  ABDOMEN:  Soft.   ASSESSMENT:  [name] improving, but slowly.   PLAN:  Continue treatments, antibiotics, etc., and follow.      Edward L. Juanetta Gosling, M.D.  Electronically Signed     ELH/MEDQ  D:  04/16/2005  T:  04/16/2005  Job:  [id number]

## 2010-11-17 NOTE — H&P (Signed)
[name], [name]              ACCOUNT NO.:  [id number]   MEDICAL RECORD NO.:  [id number]          PATIENT TYPE:  INP   LOCATION:  ED99                          FACILITY:  [facility]   PHYSICIAN:  Kingsley Callander. Ouida Sills, MD       DATE OF BIRTH:  [birth year]   DATE OF ADMISSION:  04/15/2005  DATE OF DISCHARGE:  LH                                HISTORY & PHYSICAL   CHIEF COMPLAINT:  Difficult breathing.   HISTORY OF PRESENT ILLNESS:  This patient is a 55 year old white female with  a history of non-Hodgkin's lymphoma and emphysema who presented to the  emergency room complaining of difficulty breathing.  She had had cough  without sputum production.  She had felt sick for about six weeks.  [name] a  smoker.  She complained of pain in her right lower chest.  She was evaluated  in the emergency room and was found to have a negative chest x-ray.  A D-  dimer, though, was mildly elevated, and she underwent a CT scan of the chest  which revealed infiltrates in the right middle and left lower lobes.   PAST MEDICAL HISTORY:  1.  Non-Hodgkin's lymphoma.  2.  COPD.  3.  Hysterectomy.  4.  Lumbar disk surgery.  5.  Kidney stones.  6.  Coronary artery disease, status post myocardial infarction.  7.  Peripheral vascular disease, status post stenting she states in her      aorta and groin.   MEDICATIONS:  1.  Alprazolam 1 mg t.i.d.  2.  Vicodin two to three per day.  3.  Crestor 40 mg daily.  4.  Imdur 30 mg daily.  5.  Metoprolol 50 mg b.i.d.  6.  Coumadin 1 mg daily.  7.  Folic acid 1 mg daily.  8.  Lasix p.r.n.  9.  Nitroglycerin p.r.n.  10. Aspirin 81 mg daily.   ALLERGIES:  NONE.   SOCIAL HISTORY:  She smokes.  She previously worked at [employer].  She does  not drink.   REVIEW OF SYSTEMS:  Noncontributory.   PHYSICAL EXAMINATION:  VITAL SIGNS:  Temperature 100.4, blood pressure  140/85, pulse 101, respirations 24, oxygen saturation 94%.  GENERAL:  Alert and oriented.  HEENT:  Eyes  and oropharynx are unremarkable.  NECK:  No JVD or thyromegaly.  LUNGS:  Bilateral rhonchi.  HEART:  Tachycardic with no murmurs.  CHEST:  A Port-A-Cath is in place in the left upper chest.  ABDOMEN:  Soft, nontender.  No hepatosplenomegaly.  EXTREMITIES:  The hands and feet are warm and dry.  No clubbing, cyanosis,  or edema.  NEUROLOGIC:  Grossly intact.  LYMPH NODES:  No enlargement.   LABORATORY DATA:  White count 10.0, hemoglobin 13.5, platelets 206,000, 70  neutrophils, 21 lymphocytes, 8 monocytes.  Sodium 137, potassium 3.8, bicarb  30, glucose 121, BUN 14, creatinine 0.9, calcium 8.7.  SGOT 18.  Urinalysis  reveals 7-10 white cells and a positive nitrite with few bacteria.  D-dimer  0.78, INR 0.9.  The chest CT revealed right middle lobe and left lower lobe  pneumonia.  Multiple small poorly defined nodular densities were present in  the periphery of the right upper lobe.  These were felt to likely represent  foci of infection instead of metastases or granulomas.  COPD changes were  also present.  The chest x-ray had shown no active disease.   IMPRESSION:  1.  Community-acquired pneumonia.  Treat with intravenous Rocephin and      Zithromax.  Will obtain a sputum culture if possible.  [name]      oxygenating adequately at this point.  Will continue inhaled      bronchodilators.  2.  Urinary tract infection.  3.  Non-Hodgkin's lymphoma.  4.  Chronic obstructive pulmonary disease.  5.  Coronary artery disease.  6.  Peripheral vascular disease.      Kingsley Callander. Ouida Sills, MD  Electronically Signed     ROF/MEDQ  D:  04/15/2005  T:  04/15/2005  Job:  [id number]   cc:   Ramon Dredge L. Juanetta Gosling, M.D.  Fax: [phone]   Lynett Fish, M.D.  Fax: [phone]

## 2010-11-17 NOTE — Discharge Summary (Signed)
NAMEBRIAR, SWORD              ACCOUNT NO.:  [id number]   MEDICAL RECORD NO.:  [id number]          PATIENT TYPE:  INP   LOCATION:  A336                          FACILITY:  [facility]   PHYSICIAN:  Edward L. Juanetta Gosling, M.D.DATE OF BIRTH:  Sep 05, 1955   DATE OF ADMISSION:  04/15/2005  DATE OF DISCHARGE:  LH                                 DISCHARGE SUMMARY   FINAL DISCHARGE DIAGNOSES:  1.  Pneumonia.  2.  Chronic obstructive pulmonary disease.  3.  Non-Hodgkin's lymphoma.  4.  Chronic back pain status post lumbar disk surgery.  5.  Coronary artery occlusive disease status post myocardial infarction.  6.  Peripheral vascular disease status post stenting in the aorta and groin.  7.  Hyperlipidemia.  8.  Hypertension.   This is a 55 year old with a history of non-Hodgkin's lymphoma and COPD who  came to the emergency room with shortness of breath. She has had a cough but  no sputum. She was seen in the emergency room and had a negative chest x-  ray. She underwent a CT of the chest which showed infiltrates in the right  middle and left lower lobes.   PHYSICAL EXAMINATION:  VITAL SIGNS:  Temperature 100.4, blood pressure  140/85, pulse 101, respirations 24, O2 saturation 94% on O2.  CHEST:  Bilateral rhonchi. She had a Port-A-Cath in place in the left upper  chest.  HEART:  Tachycardic.   No lymphadenopathy. Her white count 10,000; hemoglobin 13.5. Electrolytes  normal. BUN 14, creatinine 0.9. Urine showed positive nitrites, few  bacteria. D-dimer of 0.78. The CT as mentioned.   HOSPITAL COURSE:  She was admitted and started on intravenous Rocephin and  Zithromax. Given inhaled bronchodilators. Had a nicotine patch placed to  help her with smoking cessation. She improved markedly over the next several  days. [name] discharged home on October 18. Strongly encouraged again to  stop smoking and to use a patch if she needs to. [name] to continue on Xanax  1 mg t.i.d., Vicodin 2-3 daily  p.r.n. pain, Crestor 40 mg daily, Imdur 30 mg  daily, metoprolol 50 mg b.i.d., Coumadin 1 mg daily, folic acid 1 mg daily,  Lasix 40 mg p.r.n., nitroglycerin 0.4 p.r.n. and aspirin 81 mg daily. [name]  going to be on a Z-Pak and Ceftin 250 mg b.i.d., Codiclear DH 5 cubic  centimeters q.4h. p.r.n. cough and follow from there.     Edward L. Juanetta Gosling, M.D.  Electronically Signed    ELH/MEDQ  D:  04/18/2005  T:  04/18/2005  Job:  [id number]

## 2010-11-17 NOTE — Discharge Summary (Signed)
[name], [name]              ACCOUNT NO.:  [id number]   MEDICAL RECORD NO.:  [id number]          PATIENT TYPE:  INP   LOCATION:  A204                          FACILITY:  [facility]   PHYSICIAN:  [name], M.D.DATE OF BIRTH:  [birth year]   DATE OF ADMISSION:  10/18/2004  DATE OF DISCHARGE:  04/22/2006LH                                 DISCHARGE SUMMARY   FINAL DISCHARGE DIAGNOSES:  1.  Toxic metabolic encephalopathy.  2.  Seizures versus pseudoseizures.  3.  Chronic obstructive pulmonary disease.  4.  Hypertension.  5.  Severe peripheral vascular disease, status post multiple procedures.  6.  Coronary artery occlusive disease.  7.  History of lymphoma, treated with CHOP therapy.  8.  Depression.  9.  Chronic back pain, status post surgery.   SUBJECTIVE:  [name] is a 55 year old who has a long known history of  multiple medical problems, including COPD, hypertension, multiple vascular  stents, lymphoma.  She was found confused at home, had what appeared to be a  seizure, and was brought to the emergency room.  In the emergency room, she  had another what appeared to be a seizure, had a CT scan which was negative  acutely.   PHYSICAL EXAMINATION:  GENERAL  Her examination shows a well-developed, well-  nourished female who initially was very confused.  VITAL SIGNS:  Her blood pressure was 140/89, pulse 63, respirations 24,  temperature 97.9.  HEENT:  Pupils reactive.  Nose and throat are clear.  HEART:  Regular.  ABDOMEN:  Soft.  EXTREMITIES:  She was able to move all four extremities.  Strength was  normal, but she was very confused and had some hallucinations.   LABORATORY DATA:  She was hypokalemic, but no other definite cause of her  problems.  She also had what appeared to be an urinary tract infection.   HOSPITAL COURSE:  She was started on Dilantin and showed marked improvement  in her mentation.  She did receive Haldol.  Had an MRI scan which did not  show  any definite focal abnormalities.  Had consultation with Dr. Gerilyn [name]  who felt that she had some sort of a metabolic problem causing her  difficulty, and by October 21, 2004, she was much improved, and is discharged  home in much improved condition.  [name] going to be back on her regular  medications which are:   DISCHARGE MEDICATIONS:  1.  Xanax.  2.  Vicodin.  3.  Isosorbide mononitrate.  4.  Coumadin.  5.  Metoprolol.  6.  Folic acid.  7.  Lasix.  8.  Nitroglycerin.  9.  She had been on bupropion and I have asked her stop that because it does      enhance seizures.  10. She will be on ________________ 300 mg at bedtime.      ELH/MEDQ  D:  10/21/2004  T:  10/21/2004  Job:  [id number]

## 2010-11-17 NOTE — Assessment & Plan Note (Signed)
[facility]                            CARDIOLOGY OFFICE NOTE   NAME:Belkin, ANGELICE PIECH                     MRN:          [id number]  DATE:11/06/2006                            DOB:          1956/06/10    [name] is in for followup.  In general she has been stable.  She  recently had her back surgery and she is slightly improved.  This was  done at [facility] because they did not accept her insurance in  [place].  She has unfortunately continued to smoke.  She has not been  having chest pain.  Because of her worsening shortness of breath she  wants to use an inhaler but she has been followed by Dr. Juanetta Gosling  closely.   EXAMINATION:  Today the blood pressure was 122 in the left arm systolic  over 60, and 135/60 in the right arm.  There was a loud bruit in the  left subclavian area consistent with her known subclavian stenosis.  The  lung fields reveal marked prolonged expiration with markedly decreased  breath sounds, compatible with severe COPD.  Cardiac rhythm itself was  regular but one could not distinguish the transmitted bruits from a  murmur.  The abdomen in intact and there are soft bifemoral bruits with  diminished femoral pulses.  There is flow to the feet.   IMPRESSION:  1. Coronary artery disease with prior inferior infarction.  2. Advanced peripheral vascular disease with a carotid and/or      subclavian disease.  3. Continue tobacco use.  4. Severe chronic obstructive pulmonary disease.  5. Prior stenting of the abdominal aorta due to severe aortoiliac      disease.  6. Prior abdominal lymphoma followed in [place].   PLAN:  1. We had a long discussion today about discontinuation of smoking and      its absolute necessity for long term outcome.  She understands      this.  2. She will need a lipid and liver profile when we see her back in      October.     Arturo Morton. Riley Kill, MD, [facility]  Electronically Signed    TDS/MedQ  DD:  11/06/2006  DT: 11/06/2006  Job #: [id number]

## 2010-11-17 NOTE — Group Therapy Note (Signed)
[name], [name]              ACCOUNT NO.:  [id number]   MEDICAL RECORD NO.:  [id number]          PATIENT TYPE:  INP   LOCATION:  A204                          FACILITY:  [facility]   PHYSICIAN:  Edward L. Juanetta Gosling, M.D.DATE OF BIRTH:  October 23, 1955   DATE OF PROCEDURE:  10/21/2004  DATE OF DISCHARGE:                                   PROGRESS NOTE   PROBLEM:  Multi-factorial encephalopathy.   SUBJECTIVE:  [name] is awake and alert this morning, has no new  complaints.   PHYSICAL EXAMINATION:  VITAL SIGNS:  Her temperature is 97.3, pulse is 60,  respirations 20, blood pressure 134/71, O2 saturation is 94% on room air.  CHEST:  Clear.  HEART:  Regular.   ASSESSMENT:  [name] much improved.   PLAN:  To discharge home.  Please see discharge summary for details.      ELH/MEDQ  D:  10/21/2004  T:  10/21/2004  Job:  [id number]

## 2010-11-17 NOTE — Discharge Summary (Signed)
[facility]  Patient:    [name], [name] Visit Number: [id number] MRN: [id number]          Service Type: MED Location: 2A A223 01 Attending Physician:  Fredirick Maudlin Dictated by:   Kari Baars, M.D. Admit Date:  10/27/2001 Discharge Date: 10/28/2001                             Discharge Summary  FINAL DISCHARGE DIAGNOSES: 1. Chest pain. 2. History of peripheral vascular disease. 3. Chronic obstructive pulmonary disease. 4. Hyperlipidemia. 5. Hypertension.  HISTORY:  Ms. Vannatter is a 55 year old who came to the emergency room with chest pain.  She took a nitroglycerin which helped.  She was treated with nitroglycerin and her pain stopped.  By the time she her pain had resolved she was feeling better, but had cardiac enzymes done that were negative.  However, because of the nature of the pain and the fact that it did respond to nitroglycerin, she was observed overnight.  She had no further chest pain during her hospitalization.  Her cardiac enzymes remained normal and because of these findings she was discharged home in improved condition.  She was discharged home on her medications as previously, to follow in my office in about a month. Dictated by:   Kari Baars, M.D. Attending Physician:  Fredirick Maudlin DD:  10/28/01 TD:  10/29/01 Job: [id number]

## 2010-11-17 NOTE — Group Therapy Note (Signed)
[facility]  Patient:    [name], [name] Visit Number: [id number] MRN: [id number]          Service Type: MED Location: 2A A223 01 Attending Physician:  Fredirick Maudlin Dictated by:   Kari Baars, M.D. Proc. Date: 10/28/01 Admit Date:  10/27/2001 Discharge Date: 10/28/2001                               Progress Note  PROBLEM:  Chest pain.  SUBJECTIVE:  [name] says she is better and feels better in general. She has no new complaints.  OBJECTIVE:  Her exam shows that her chest is much clearer. Her heart is regular. Her abdomen is soft. Her cardiac enzymes are negative.  ASSESSMENT:  [name] doing well.  PLAN:  Considering that her cardiac enzymes are negative and that she is not having anymore chest pain, I think we can go ahead and discharge her today. Please see discharge summary for details. Dictated by:   Kari Baars, M.D. Attending Physician:  Fredirick Maudlin DD:  10/28/01 TD:  10/28/01 Job: [id number]

## 2010-11-17 NOTE — Cardiovascular Report (Signed)
NAMETIMIKO, OFFUTT                        ACCOUNT NO.:  [id number]   MEDICAL RECORD NO.:  [id number]                   PATIENT TYPE:  OIB   LOCATION:  2899                                 FACILITY:  [facility]   PHYSICIAN:  Charlies Constable, M.D. [facility]              DATE OF BIRTH:  10/13/1955   DATE OF PROCEDURE:  03/16/2004  DATE OF DISCHARGE:                              CARDIAC CATHETERIZATION   CLINICAL HISTORY:  Ms. Mehlman is 55 years old and has had stenting of the  aorta and iliac vessels by Dr. Allyson Sabal and has been seen subsequently for her  peripheral vascular disease by Dr. Samule Ohm.  She has had nonobstructive  disease with catheterization.  She recently had a screening Cardiolite done  as a preoperative evaluation for lumbar back surgery by Dr. Gerlene Fee and this  was abnormal which showed inferior scar and periscar ischemia and she was  brought in for evaluation and angiography.  She also has a follicular  lymphoma and has a Portacath and has been receiving chemotherapy under the  direction of Dr. Cleone Slim in [place].   PROCEDURE:  Left heart catheterization was performed percutaneously through  the right femoral artery using arterial sheath and 6 French preformed  coronary catheters. A front wall arterial puncture was performed and  Omnipaque contrast was used.  A distal aortogram was performed to evaluate  the patient's aortoiliac stents.  He tolerated the procedure well and left  the laboratory in satisfactory condition.   RESULTS:  The aortic pressure was 150/82 with mean of 109.  Left ventricular  pressure was 150/18.   Left main coronary artery:  The left main coronary was free of significant  disease.   Left anterior descending artery:  The left anterior descending artery gave  rise to three diagonal branches and two septal perforators.  The LAD was  irregular, but there was no significant obstruction.   Circumflex artery:  The circumflex artery gave rise to a marginal branch  and  a posterior lateral branch.  The circumflex was irregular, but there was no  significant obstruction.   Right coronary artery:  The right coronary artery gave rise to two right  ventricular branches and then was completely occluded in its mid to distal  portion.  There was also segmental disease extending from the proximal  vessel to the occlusion with 70 and 90% diffuse stenosis.  The distal right  coronary artery filled via collaterals from the left coronary system.  These  collaterals were faint and consisted of two posterior lateral and a  posterior descending branch.   LEFT VENTRICULOGRAM:  The left ventriculogram performed in the RAO  projection showed akinesis of the inferior wall and including the very tip  of the apex.  The anterior lateral wall moved fairly well.  The estimated  ejection fraction was 35%.   DISTAL AORTOGRAM:  Distal aortogram was performed which showed 30% narrowing  of  left renal artery.  There was 60% narrowing in the aorta just above the  stent.  There was stent in the distal aorta extending into both iliacs.  It  appeared that the femoral artery was narrowed just above the sheath, but  this was no well visualized on the distal aortogram.   CONCLUSION:  Coronary artery disease with total occlusion of the mid to  distal right coronary artery, irregularities in the left anterior descending  and circumflex artery, and inferior wall akinesis.   RECOMMENDATIONS:  The patient has a chronic total occlusion of the right  coronary artery which is unfavorable for percutaneous coronary intervention.  I think we will recommend medical therapy.  She will be at some increased  risk for lumbar spine surgery due to her coronary disease and left  ventricular dysfunction, but her clinical situation is stable without much  angina and without signs or symptoms of congestive heart failure.  Will  discuss this with the patient with Dr. Gerlene Fee.  Will let the patient go   home as an outpatient today and followup with Dr. Riley Kill.                                               Charlies Constable, M.D. [facility]    BB/MEDQ  D:  03/16/2004  T:  03/16/2004  Job:  [id number]   cc:   Reinaldo Meeker, M.D.  [address]  [place]  [place] [address]  Fax: [phone]   Vida Roller, M.D.  Fax: [phone]   Salvadore Farber, M.D. [facility]  [address]  [address]  [place]  [place] [address]

## 2010-11-17 NOTE — Op Note (Signed)
NAMEJOSSLYN, CIOLEK                        ACCOUNT NO.:  [id number]   MEDICAL RECORD NO.:  [id number]                   PATIENT TYPE:  OIB   LOCATION:  2010                                 FACILITY:  [facility]   PHYSICIAN:  Salvadore Farber, M.D.             DATE OF BIRTH:  10/24/1955   DATE OF PROCEDURE:  10/23/2002  DATE OF DISCHARGE:  10/24/2002                                 OPERATIVE REPORT   PROCEDURE:  Abdominal aortography with runoff, balloon angioplasty of in-  stent restenosis in the left iliac stent, stenting of the right common iliac  artery.   INDICATIONS FOR PROCEDURE:  The patient is a 55 year old lady with severe  peripheral vascular disease, status post iliac stenting in 1999 by Dr.  Allyson Sabal.  Cardiac catheterization performed in April of 2002 demonstrated  patent bilateral iliac stents.  However, she presented in late January with  bilateral calf discomfort occurring with approximately one block exertion.  I devised an initial strategy of exercise and smoking cessation. She has cut  back her cigarettes from 1-1/2 packs to 1/4 packs of cigarettes per day.  Exercise has been limited by her severe claudication. She has no ulceration  and no rest pain. Due to her lifestyle limiting claudication, she is brought  to the lab for angiography with percutaneous intervention on bilateral iliac  disease.  Noninvasive studies from October of 2003 demonstrate ABI of 0.76  on the right and 0.89 on the left.   DESCRIPTION OF PROCEDURE:  Informed consent was obtained.  Under 1%  lidocaine local anesthesia,  a 5 French sheath was placed in the right  femoral artery using the modified Seldinger technique. A Wooley wire was  advanced into the abdominal aorta. Over this wire, a 5 French pigtail  catheter was advanced into the distal abdominal aorta. Abdominal aortography  with runoff to both feet was performed by power injection and stepping of  the table.  Subsequent angulated  images of the iliacs were obtained.  Pressure pullback was obtained from the abdominal aorta across the right  femoral arterial lesion demonstrating a hemodynamically significant peak  gradient of approximately 30 mmHg.  Plan was therefore made to intervene.  Under 1% lidocaine local anesthesia, a 6 French sheath was placed in the  left femoral artery using the modified Seldinger technique. Anticoagulation  was initiated with 5000 units of heparin.  Both sheaths were then exchanged  for 35 cm 6 French bright tip sheaths.  The right common iliac lesion was  then stented using an 8.0 x 18 mm Genesis on Aviator deployed at 6  atmospheres.  The stent was then post dilated using a 7.0 mm Powerflex  balloon at 10 atmospheres.  Attention was then turned to the left common  iliac. The 7.0 x 15 mm Powerflex balloon was then used to perform balloon  angioplasty within a segment of in-stent restenosis in the left common  iliac. It was inflated to 6 atmospheres for 30 seconds.  Finally, the right  common iliac was again post dilated using an 8.0 x 20 mm Obtipro balloon.  Final angiograms demonstrated no residual stenosis in either lesion.   FINDINGS:  1. Abdominal aorta; diffusely diseased infrarenal abdominal aorta with     approximately 50% stenosis above the previously placed aortic stent.     There is a 7 mm mean translesional gradient.  2. Renal arteries; single renal arteries bilaterally. Right artery is     normal. The left has a stable 50% stenosis.  3. Aortoiliac; 80% in-stent restenosis in the left iliac stent. This was     successfully angioplastied until no residual stenosis. There was a 70%     stenosis distal to the previously placed right iliac stent.  This was     stented with good result resulting in no residual stenosis.  4. SFA; mild bilateral SFA disease, less than 40% bilaterally.  5. Three vessel runoff bilaterally.    IMPRESSION:  Successful balloon angioplasty of the left  common iliac artery  and stenting of the right common iliac. Will plan aspirin to be continued  indefinitely. Plavix will be administered for one month. Complete smoking  cessation was again strongly emphasized.  Exercise therapy is again advised.                                               Salvadore Farber, M.D.    WED/MEDQ  D:  12/28/2002  T:  12/28/2002  Job:  [id number]

## 2010-11-17 NOTE — Procedures (Signed)
[name], [name]              ACCOUNT NO.:  [id number]   MEDICAL RECORD NO.:  [id number]          PATIENT TYPE:  INP   LOCATION:  A204                          FACILITY:  [facility]   PHYSICIAN:  Edward L. Juanetta Gosling, M.D.DATE OF BIRTH:  July 08, 1955   DATE OF PROCEDURE:  10/18/2004  DATE OF DISCHARGE:                                EKG INTERPRETATION   The rhythm is sinus rhythm with a rate in the 60s. There is an  interventricular conduction delay. There is an incomplete right bundle  branch block. Right atrial enlargement is seen. QT interval was prolonged  which may indicate electrolyte imbalance, drug effect or primary myocardial  disease. Abnormal electrocardiogram.      ELH/MEDQ  D:  10/19/2004  T:  10/20/2004  Job:  [id number]

## 2010-11-17 NOTE — H&P (Signed)
[facility]  Patient:    [name], [name] Visit Number: [id number] MRN: [id number]          Service Type: MED Location: 2A A223 01 Attending Physician:  Hilario Quarry Dictated by:   Kari Baars, M.D. Admit Date:  10/27/2001                           History and Physical  CHIEF COMPLAINT:  Chest pain.  HISTORY OF PRESENT ILLNESS:  [name] is a 55 year old, who came to the emergency room with chest pain.  She took a nitroglycerin that helped.  She has a history of peripheral vascular disease, having had distal abdominal aortic and bilateral iliac stenting because of claudication.  She had a cardiac catheterization done about two years ago that was negative for significant occlusion.  When she came to the emergency room her pain had resolved.  She also said that she was more short of breath than usual.  She denied any nausea or vomiting.  PAST MEDICAL HISTORY:  1. Positive for problems with severe peripheral vascular disease.  2. Probably early COPD.  3. Hyperlipidemia.  4. Hypertension.  SOCIAL HISTORY:  She continues to smoke about a pack to 1-1/2 packs of cigarette daily.  Does not use any alcohol.  FAMILY HISTORY:  Very positive for cardiac disease, and her father just recently died of cardiac disease.  He also had a history of atrial fibrillation and of a renal cell cancer.  REVIEW OF SYSTEMS:  Except as mentioned is negative.  PHYSICAL EXAMINATION:  GENERAL:  Well-developed, well-nourished, thin female, who is now in no acute distress.  CHEST:  Some wheezes bilaterally.  HEART:  Regular without murmur, gallop, or rub.  ABDOMEN:  Soft.  No masses felt.  EXTREMITIES:  No edema.  Peripheral pulses are decreased but present.  HEENT:  PERRL.  Nose and throat clear.  NECK:  Supple without masses.  No bruits.  VITAL SIGNS:  Blood pressure 120/70, pulse 80, respirations 18.  EKG is essentially normal.  Cardiac enzymes are  normal.  ASSESSMENT:  She had chest pain that was responsive to nitroglycerin.  She did have a relatively normal cardiac catheterization about two years ago.  PLAN:  Plan is to have her do a rule out myocardial infarction protocol and follow from there. Dictated by:   Kari Baars, M.D. Attending Physician:  Hilario Quarry DD:  10/27/01 TD:  10/28/01 Job: [id number]

## 2010-11-17 NOTE — Discharge Summary (Signed)
   [name], [name]                        ACCOUNT NO.:  [id number]   MEDICAL RECORD NO.:  [id number]                   PATIENT TYPE:  OIB   LOCATION:  2010                                 FACILITY:  [facility]   PHYSICIAN:  Dian Queen, P.A. [facility]               DATE OF BIRTH:  [birth year]   DATE OF ADMISSION:  10/23/2002  DATE OF DISCHARGE:  10/24/2002                           DISCHARGE SUMMARY - REFERRING   HISTORY:  [name] is a 55 year old white female who underwent iliac  stenting by Dr. Allyson Sabal in 1999.  She was cathed in April 2002 and found to  have a 50% left renal artery stenosis and patent bilateral iliac stents.  She now presents with recurrent cath claudication at one block, which has  been much worse over the last year.  Unfortunately, she is smoking again.   She was admitted on this occasion for abdominal aortogram with runoff and  PCI if indicated.   ACCESSORY CLINICAL FINDINGS:  Electrolytes and renal function totally  normal.  CBC was normal.  Coagulation profile was normal.   HOSPITAL COURSE:  During her course in the hospital, patient underwent  abdominal aortogram with runoff.  She had a successful stenting of her right  common iliac.  She was found to have diffuse disease of the infrarenal  abdominal aorta with 50% sludges above the previous stent sites.  Her right  renals were normal; 50% left renal artery stenosis.  She had an 80% lesion  in her left iliac.  It was reduced to 0%.  A 70% lesion, distal to the right  iliac stent, which was reduced to 0%.  She was discharged home the following  day.   DISCHARGE MEDICATIONS:  She will stay on aspirin indefinitely and Plavix for  a month.   SPECIAL INSTRUCTIONS:  She was emphatically told to stop smoking.   FINAL DIAGNOSES:  1. Peripheral vascular disease - status post intervention as above.  2. Claudication, with cigarette use.  3. Dyslipidemia - treated.  4.     Chronic obstructive pulmonary disease.  5. Status post hysterectomy, 1986.   DISPOSITION:  Patient discharged home on same medications plus Plavix 75 mg  q.d. for one month.                                               Dian Queen, P.A. [facility]    BY/MEDQ  D:  10/24/2002  T:  10/26/2002  Job:  [id number]   cc:   Shaune Pollack, M.D.  [place],  [facility] Elenora Fender M.D. [place]  [place]. Ladona Ridgel, M.D.

## 2010-11-17 NOTE — Cardiovascular Report (Signed)
[place]  Patient:    [name], [name]                     MRN: [id number] Proc. Date: 08/22/99 Adm. Date:  [id number] Attending:  Berry, Jonathan [place] CC:         [facility], [address] [place]             Cardiac Catheterization Laboratory             Kari Baars, M.D., [place]                        Cardiac Catheterization  INDICATIONS:  Ms. Katrinka Blazing is a 55 year old single white female with a history of noncritical CAD by test in October of 1998.  She has peripheral vascular occlusive disease, status post distal abdominal aortic and bilateral iliac PT and stenting for relief of symptoms of claudication.  Other problems include ongoing tobacco  abuse and hyperlipidemia.  She has been complaining of some nitroglycerin responsive chest pain as well as recurrent bilateral lower extremity claudication. A Persantine Cardiolite stress test performed on February 14 suggested mild anteroapical ischemia that was normal dynamic in images.  She presents now for cardiac catheterization as well as abdominal aortography.  DESCRIPTION OF PROCEDURE:  The patient was brought to the second floor [facility] in the postabsorptive state.  She was premedicated ith p.o. Valium and IV Versed.  Her right groin was prepped and shaved in the usual  sterile fashion.  Xylocaine 1% was used for local anesthesia.  A 6 French sheath was inserted into the right femoral artery using standard Seldinger technique. A 6 French right and left Judkins diagnostic catheters as well as a 6 French pigtail catheter were used for selective coronary angiography, left  ventriculography, additional abdominal aortography.  Omnipaque dye was used for the entirety of the case.  Retrograde, aortic, left ventricular and pullback pressures were recorded.  HEMODYNAMICS: 1. Aortic systolic pressure 142, diastolic pressure  76. 2. Left ventricular systolic pressure 141, diastolic pressure 20.  SELECTIVE CORONARY ANGIOGRAPHY: 1. Left main:  Normal 2. Left anterior descending:  The LAD normal. 3. Left circumflex:  Normal. 4. Right coronary artery:  This is a dominant vessel that had only minor    irregularities in the 30% range in the mid and distal portion.  LEFT VENTRICULOGRAPHY:  The RAO left ventriculogram was performed using 20 cc of Omnipaque dye, 10 cc per second.  The overall LVEF was estimated greater than 60% without focal wall motion abnormalities.  DISTAL ABDOMINAL AORTOGRAPHY:  Distal abdominal aortogram was performed using 30 cc of Omnipaque dye, 20 cc per second as well as an additional injection at the bifurcation at 20 cc of Omnipaque dye, 20 cc per second.  There was a 50% proximal left renal artery stenosis noted.  The distal abdominal aortic and iliac stents  were widely patent with at most 30% in-stent re-stenosis.  There was a 20 mm pullback gradient noted across the right common iliac artery stent.  OVERALL IMPRESSION:  Ms. Abate has noncritical coronary artery disease with normal left ventricular function.  I suspect her pain is noncardiac and most likely gastrointestinal.  She had a false-positive Cardiolite.  RECOMMENDATIONS:  Empiric gastrointestinal therapy will be recommended. Imaging of her distal abdominal aortic and iliac stents revealed them to be widely patent. No further intervention will be  recommended at this time.  The sheaths were removed and pressure was held on the groin to achieve hemostasis. The patient left the lab in stable condition.  She will be discharged home today as an outpatient and will see me back in [place] in approximately two weeks. DD:  08/22/99 TD:  08/22/99 Job: [id number]

## 2010-11-17 NOTE — Group Therapy Note (Signed)
NAMEJERUSALEM, [name]              ACCOUNT NO.:  [id number]   MEDICAL RECORD NO.:  [id number]          PATIENT TYPE:  INP   LOCATION:  A336                          FACILITY:  [facility]   PHYSICIAN:  Edward L. Juanetta Gosling, M.D.DATE OF BIRTH:  1956/04/27   DATE OF PROCEDURE:  04/17/2005  DATE OF DISCHARGE:                                   PROGRESS NOTE   PROBLEM:  Chronic obstructive pulmonary disease with exacerbation.   SUBJECTIVE:  [name] says she is doing better.  She has still some right-  sided chest discomfort when she takes a deep breath or coughs.  Otherwise,  she is about the same.  Temperature is 98.1, pulse is 84, respirations 20,  blood pressure 111/58.  Her O2 saturation is 96% on 1 L.  Her chest still  shows rhonchi on the right, minimal wheezes bilaterally.  Her heart is  regular.   ASSESSMENT:  Overall she is better.   PLAN:  Continue with treatments.  Continue trying to get her up and moving  around.  Hopefully she will be ready for discharge tomorrow but she is still  quite congested.      Edward L. Juanetta Gosling, M.D.  Electronically Signed     [facility]/MEDQ  D:  04/17/2005  T:  04/17/2005  Job:  [id number]

## 2010-12-27 ENCOUNTER — Encounter: Payer: Self-pay | Admitting: Cardiology

## 2010-12-27 ENCOUNTER — Ambulatory Visit (INDEPENDENT_AMBULATORY_CARE_PROVIDER_SITE_OTHER): Payer: Medicare Other | Admitting: Cardiology

## 2010-12-27 DIAGNOSIS — Z87898 Personal history of other specified conditions: Secondary | ICD-10-CM

## 2010-12-27 DIAGNOSIS — I70219 Atherosclerosis of native arteries of extremities with intermittent claudication, unspecified extremity: Secondary | ICD-10-CM

## 2010-12-27 DIAGNOSIS — I6529 Occlusion and stenosis of unspecified carotid artery: Secondary | ICD-10-CM

## 2010-12-27 DIAGNOSIS — I2510 Atherosclerotic heart disease of native coronary artery without angina pectoris: Secondary | ICD-10-CM

## 2010-12-27 DIAGNOSIS — E7800 Pure hypercholesterolemia, unspecified: Secondary | ICD-10-CM

## 2010-12-27 DIAGNOSIS — E785 Hyperlipidemia, unspecified: Secondary | ICD-10-CM

## 2010-12-27 DIAGNOSIS — J449 Chronic obstructive pulmonary disease, unspecified: Secondary | ICD-10-CM

## 2010-12-27 DIAGNOSIS — I10 Essential (primary) hypertension: Secondary | ICD-10-CM

## 2010-12-27 NOTE — Progress Notes (Signed)
HPI:  She had bilateral iliac pta due to renarrowing.  Still smoking quite a bit.  Not a lot of swelling.  No chest pain at this time.  Taking Prednisone for lungs, and antibiotics on the weekend.  Gets labs done with Dr. Cleone Slim.  Has left subclavian stenosis, but not all that symptomic.  Carotids without severe narrowing in Februaury.  Current Outpatient Prescriptions  Medication Sig Dispense Refill  . albuterol (VENTOLIN HFA) 108 (90 BASE) MCG/ACT inhaler Inhale 2 puffs into the lungs 3 (three) times daily.        [name] ALPRAZolam (XANAX) 1 MG tablet Take 1 mg by mouth 3 (three) times daily.        [name] aspirin 81 MG EC tablet Take 81 mg by mouth daily.        [name] buPROPion (WELLBUTRIN SR) 150 MG 12 hr tablet Take 150 mg by mouth daily.        . diazepam (VALIUM) 5 MG tablet Take 5 mg by mouth 3 (three) times daily.        [name] esomeprazole (NEXIUM) 40 MG capsule Take 40 mg by mouth 2 (two) times daily.       . fluorometholone (FML) 0.1 % ophthalmic suspension Place 1 drop into both eyes 2 (two) times daily.        . metoprolol (LOPRESSOR) 50 MG tablet Take 25 mg by mouth 2 (two) times daily.        . Multiple Vitamin (MULTIVITAMIN) tablet Take 1 tablet by mouth daily.        [name] NITROSTAT 0.4 MG SL tablet DISSOLVE 1 TABLET UNDER  TONGUE EVERY 5 MINUTES UPTO 15 MINUTES FOR CHEST  PAIN. IF NO RELIEF CALL  25 each  10  . Omega-3 Fatty Acids (FISH OIL) 1000 MG CAPS Take 1 capsule by mouth daily.        [name] oxyCODONE (OXYCONTIN) 40 MG 12 hr tablet Take 80 mg by mouth as needed.       [name] oxycodone (ROXICODONE) 30 MG immediate release tablet Take 30 mg by mouth every 4 (four) hours as needed.        . predniSONE (DELTASONE) 20 MG tablet Take 20 mg by mouth daily.        . rosuvastatin (CRESTOR) 40 MG tablet Take 40 mg by mouth daily.        [name] sulfamethoxazole-trimethoprim (BACTRIM,SEPTRA) 400-80 MG per tablet Take 2 tablets by mouth. Every Saturday and Sunday       . vitamin A 16109 UNIT capsule Take 10,000 Units by  mouth daily.        . vitamin B-12 (CYANOCOBALAMIN) 500 MCG tablet Take 500 mcg by mouth daily.          No Known Allergies  Past Medical History  Diagnosis Date  . Coronary artery disease     status post stenting of the aortic and iliac vessels by Dr. Allyson Sabal. Repeat aortic stenting 2010  . Left ventricular dysfunction     hx of with ejection fraction of 35% range.  . Carotid artery disease     hx of carotid cerebrovascular disease with 0-39% bilateral internal carotid artery setenosis   . Non Hodgkin's lymphoma   . Chronic back pain     status post lumbar surgery  . Hyperlipidemia   . Hypertension   . COPD (chronic obstructive pulmonary disease)     Past Surgical History  Procedure Date  . Lumar surg     Family History  Problem Relation Age of Onset  . Coronary artery disease      positive for vascular and cardiac disease    History   Social History  . Marital Status: Single    Spouse Name: N/A    Number of Children: 2  . Years of Education: N/A   Occupational History  . retired    Social History Main Topics  . Smoking status: Current Everyday Smoker -- 1.0 packs/day for 20 years    Types: Cigarettes  . Smokeless tobacco: Never Used   Comment: smokes 1 pack a day  . Alcohol Use: No  . Drug Use: No  . Sexually Active: Not on file   Other Topics Concern  . Not on file   Social History Narrative  . No narrative on file    ROS: Please see the HPI.  All other systems reviewed and negative.  PHYSICAL EXAM:  BP 124/86  Pulse 70  Ht 5\' 4"  (1.626 m)  Wt 162 lb 1.9 oz (73.537 kg)  BMI 27.83 kg/m2  General: Well developed, well nourished, in no acute distress. Head:  Normocephalic and atraumatic. Neck: no JVD Lungs: Decreased BS and prolonged expiration.  Heart: Normal S1 and S2.  No murmur, rubs or gallops.  Abdomen:  Normal bowel sounds; soft; non tender; no organomegaly Pulses: Cap refill, but decreased pulses.  Extremities: No clubbing or  cyanosis. No edema. Neurologic: Alert and oriented x 3.  EKG:  Non specific ST and T changes.  Prolonged QTc.  ASSESSMENT AND PLAN:

## 2010-12-27 NOTE — Assessment & Plan Note (Signed)
No current symptoms.  Continues to smoke and will not stop despite widespread vasculopathy.  Discussed.

## 2010-12-27 NOTE — Assessment & Plan Note (Signed)
Will not stop smoking.

## 2010-12-27 NOTE — Assessment & Plan Note (Signed)
Done in February.  Report reviewed.  Not seen in EPIC.

## 2010-12-27 NOTE — Patient Instructions (Signed)
Your physician wants you to follow-up in: 6 months  You will receive a reminder letter in the mail two months in advance. If you don't receive a letter, please call our office to schedule the follow-up appointment.  Your physician recommends that you continue on your current medications as directed. Please refer to the Current Medication list given to you today.  

## 2010-12-27 NOTE — Assessment & Plan Note (Signed)
Controlled.  

## 2010-12-27 NOTE — Assessment & Plan Note (Signed)
Currently on Crestor 

## 2011-01-24 ENCOUNTER — Telehealth: Payer: Self-pay | Admitting: Cardiovascular Disease

## 2011-02-01 ENCOUNTER — Encounter: Payer: Self-pay | Admitting: Cardiology

## 2011-02-23 ENCOUNTER — Encounter: Payer: Medicare Other | Admitting: Cardiology

## 2011-03-14 ENCOUNTER — Other Ambulatory Visit: Payer: Self-pay | Admitting: Cardiology

## 2011-03-14 ENCOUNTER — Encounter (INDEPENDENT_AMBULATORY_CARE_PROVIDER_SITE_OTHER): Payer: Medicare Other | Admitting: Cardiology

## 2011-03-14 DIAGNOSIS — I700 Atherosclerosis of aorta: Secondary | ICD-10-CM

## 2011-03-14 DIAGNOSIS — I739 Peripheral vascular disease, unspecified: Secondary | ICD-10-CM

## 2011-03-22 ENCOUNTER — Ambulatory Visit (INDEPENDENT_AMBULATORY_CARE_PROVIDER_SITE_OTHER): Payer: Medicare Other | Admitting: Cardiovascular Disease

## 2011-03-22 ENCOUNTER — Encounter: Payer: Self-pay | Admitting: Cardiovascular Disease

## 2011-03-22 VITALS — BP 130/74 | HR 59 | Ht 64.0 in | Wt 158.8 lb

## 2011-03-22 DIAGNOSIS — I70219 Atherosclerosis of native arteries of extremities with intermittent claudication, unspecified extremity: Secondary | ICD-10-CM

## 2011-03-22 NOTE — Patient Instructions (Signed)
Your physician recommends that you schedule a follow-up appointment in: 6 MONTHS  WITH DR Excell Seltzer WITH ABI AND ABD U/S Your physician recommends that you continue on your current medications as directed. Please refer to the Current Medication list given to you today.  Your physician has requested that you have an abdominal aorta duplex. During this test, an ultrasound is used to evaluate the aorta. Allow 30 minutes for this exam. Do not eat after midnight the day before and avoid carbonated beverages 6 MONTHS  AND SEE DR COPPER  DX 440.21 Your physician has requested that you have an ankle brachial index (ABI). During this test an ultrasound and blood pressure cuff are used to evaluate the arteries that supply the arms and legs with blood. Allow thirty minutes for this exam. There are no restrictions or special instructions. 6 MONTHS AND SEE DR COOPER DX 440.21

## 2011-03-22 NOTE — Progress Notes (Signed)
HPI:  This is a 55 year old woman presenting for followup evaluation. She has peripheral arterial disease with history of aortoiliac stenosis. She has undergone multiple endovascular procedures including distal aortic stenting and bilateral iliac stenting. Her most recent procedure involved bilateral angioplasty of restenosis he is in her iliac stents. This was performed about 6 months ago. The patient had improvement in her leg pain for approximately one month, then she developed recurrent calf tightness with walking. She also complains of pain in her outer thighs when she walks. She has resting numbness and pain on the dorsum of her foot and has been diagnosed with neuropathy. She continues to smoke at least one pack per day. [name] also limited by dyspnea. [name] on chronic prednisone for COPD. She has multiple medical problems.  She recently underwent lower extremity duplex scanning and ABIs September 12 demonstrating an ABI of 79% on the right and 84% on the left. Her waveforms suggest inflow disease. Her aortoiliac duplex showed bilateral common iliac restenosis but the study was technically limited.  Outpatient Encounter Prescriptions as of 03/22/2011  Medication Sig Dispense Refill  . albuterol (VENTOLIN HFA) 108 (90 BASE) MCG/ACT inhaler Inhale 2 puffs into the lungs 3 (three) times daily.        [name] ALPRAZolam (XANAX) 1 MG tablet Take 1 mg by mouth 3 (three) times daily.        [name] amitriptyline (ELAVIL) 25 MG tablet Take 25 mg by mouth at bedtime.        [name] aspirin 81 MG EC tablet Take 81 mg by mouth daily.        [name] buPROPion (WELLBUTRIN SR) 150 MG 12 hr tablet Take 150 mg by mouth daily.        . diazepam (VALIUM) 5 MG tablet Take 5 mg by mouth 3 (three) times daily.        [name] esomeprazole (NEXIUM) 40 MG capsule Take 40 mg by mouth 2 (two) times daily.       . fluorometholone (FML) 0.1 % ophthalmic suspension Place 1 drop into both eyes 2 (two) times daily.        . metoprolol (LOPRESSOR) 50 MG  tablet Take 25 mg by mouth 2 (two) times daily.        . Multiple Vitamin (MULTIVITAMIN) tablet Take 1 tablet by mouth daily.        [name] NITROSTAT 0.4 MG SL tablet DISSOLVE 1 TABLET UNDER  TONGUE EVERY 5 MINUTES UPTO 15 MINUTES FOR CHEST  PAIN. IF NO RELIEF CALL  25 each  10  . Omega-3 Fatty Acids (FISH OIL) 1000 MG CAPS Take 1 capsule by mouth daily.        [name] oxyCODONE (OXYCONTIN) 40 MG 12 hr tablet Take 80 mg by mouth as needed.       [name] oxycodone (ROXICODONE) 30 MG immediate release tablet Take 30 mg by mouth every 4 (four) hours as needed.        . predniSONE (DELTASONE) 20 MG tablet Take 20 mg by mouth daily.        . rosuvastatin (CRESTOR) 40 MG tablet Take 40 mg by mouth daily.        [name] sulfamethoxazole-trimethoprim (BACTRIM,SEPTRA) 400-80 MG per tablet Take 2 tablets by mouth. Every Saturday and Sunday       . vitamin A 81191 UNIT capsule Take 10,000 Units by mouth daily.        . vitamin B-12 (CYANOCOBALAMIN) 500 MCG tablet Take 500 mcg by  mouth daily.          No Known Allergies  Past Medical History  Diagnosis Date  . Coronary artery disease     status post stenting of the aortic and iliac vessels by Dr. Allyson Sabal. Repeat aortic stenting 2010  . Left ventricular dysfunction     hx of with ejection fraction of 35% range.  . Carotid artery disease     hx of carotid cerebrovascular disease with 0-39% bilateral internal carotid artery setenosis   . Non Hodgkin's lymphoma   . Chronic back pain     status post lumbar surgery  . Hyperlipidemia   . Hypertension   . COPD (chronic obstructive pulmonary disease)     ROS: Negative except as per HPI  BP 130/74  Pulse 59  Ht 5\' 4"  (1.626 m)  Wt 158 lb 12.8 oz (72.031 kg)  BMI 27.26 kg/m2  PHYSICAL EXAM: Pt is alert and oriented, chronically ill-appearing woman in NAD HEENT: normal Neck: JVP - normal, carotids 2+= with bilateral bruits Lungs: CTA bilaterally CV: RRR without murmur or gallop Abd: soft, NT, Positive BS, no  hepatomegaly Ext: no C/C/E, posterior tibial pulses are 1+ on the right and 2+ on the left, dorsalis pedis pulses are trace on the right and 1+ on the left. Skin: warm/dry with diffuse ecchymoses especially over both arms  EKG:  sinus bradycardia with rightward axis, 59 beats per minute, nonspecific ST abnormality.  ASSESSMENT AND PLAN:

## 2011-03-23 ENCOUNTER — Encounter: Payer: Self-pay | Admitting: Cardiovascular Disease

## 2011-03-23 LAB — FERRITIN: Ferritin: 275 (ref 10–291)

## 2011-03-23 LAB — CBC
HCT: 38.8
HCT: 23.8 — ABNORMAL LOW
HCT: 24 — ABNORMAL LOW
HCT: 27 — ABNORMAL LOW
Hemoglobin: 13.8
Hemoglobin: 8.3 — ABNORMAL LOW
Hemoglobin: 8.5 — ABNORMAL LOW
Hemoglobin: 9.4 — ABNORMAL LOW
MCHC: 35.5
MCHC: 35.3
MCV: 103.4 — ABNORMAL HIGH
MCV: 100.9 — ABNORMAL HIGH
MCV: 103.8 — ABNORMAL HIGH
MCV: 97.7
Platelets: 80 — ABNORMAL LOW
RBC: 3.76 — ABNORMAL LOW
RBC: 2.31 — ABNORMAL LOW
RBC: 2.35 — ABNORMAL LOW
RBC: 2.9 — ABNORMAL LOW
RDW: 12.9
RDW: 15.3
WBC: 2.5 — ABNORMAL LOW
WBC: 3.1 — ABNORMAL LOW
WBC: 3.1 — ABNORMAL LOW
WBC: 4

## 2011-03-23 LAB — COMPREHENSIVE METABOLIC PANEL
Albumin: 3.7
Albumin: 2.9 — ABNORMAL LOW
Alkaline Phosphatase: 48
BUN: 3 — ABNORMAL LOW
CO2: 28
Calcium: 9
Chloride: 103
Chloride: 99
Creatinine, Ser: 0.61
GFR calc Af Amer: >60
Glucose, Bld: 82
Glucose, Bld: 105 — ABNORMAL HIGH
Potassium: 3.6
Potassium: 4.3
Sodium: 137
Total Bilirubin: 0.7
Total Protein: 6

## 2011-03-23 LAB — BASIC METABOLIC PANEL
BUN: 3 — ABNORMAL LOW
CO2: 27
Calcium: 7.7 — ABNORMAL LOW
Calcium: 8.2 — ABNORMAL LOW
Chloride: 101
Creatinine, Ser: 0.51
GFR calc Af Amer: >60
GFR calc Af Amer: >60
GFR calc non Af Amer: >60
GFR calc non Af Amer: >60
Glucose, Bld: 103 — ABNORMAL HIGH
Glucose, Bld: 109 — ABNORMAL HIGH
Potassium: 3.5
Potassium: 3.8
Sodium: 133 — ABNORMAL LOW
Sodium: 137

## 2011-03-23 LAB — BLOOD GAS, ARTERIAL
Acid-Base Excess: 4.8 — ABNORMAL HIGH
Acid-Base Excess: 3 — ABNORMAL HIGH
O2 Saturation: 96.1
O2 Saturation: 86.4
TCO2: 29.3
pH, Arterial: 7.386

## 2011-03-23 LAB — PREPARE PLATELET PHERESIS

## 2011-03-23 LAB — TYPE AND SCREEN
ABO/RH(D): O POS
Antibody Screen: NEGATIVE

## 2011-03-23 LAB — APTT
aPTT: 39 — ABNORMAL HIGH
aPTT: 28

## 2011-03-23 LAB — FUNGUS CULTURE W SMEAR

## 2011-03-23 LAB — AFB CULTURE WITH SMEAR (NOT AT ARMC): Acid Fast Smear: NONE SEEN

## 2011-03-23 LAB — URINALYSIS, ROUTINE W REFLEX MICROSCOPIC
Bilirubin Urine: NEGATIVE
Nitrite: NEGATIVE
Specific Gravity, Urine: 1.017
Urobilinogen, UA: 0.2

## 2011-03-23 LAB — PROTIME-INR: INR: 1.7 — ABNORMAL HIGH

## 2011-03-23 LAB — VITAMIN B12: Vitamin B-12: 518 (ref 211–911)

## 2011-03-23 LAB — IRON AND TIBC
Iron: 27 — ABNORMAL LOW
Saturation Ratios: 12 — ABNORMAL LOW
TIBC: 227 — ABNORMAL LOW
UIBC: 200

## 2011-03-23 LAB — TISSUE CULTURE

## 2011-03-23 LAB — PREPARE RBC (CROSSMATCH)

## 2011-03-23 LAB — RETICULOCYTES: RBC.: 2.74 — ABNORMAL LOW

## 2011-03-23 NOTE — Assessment & Plan Note (Signed)
The patient has evidence of iliac restenosis. She has had multiple stents and PTCA procedures in the distal abdominal aorta and common iliac arteries bilaterally. She has palpable pulses in her feet and mild reduction in ABIs. Unfortunately she continues to smoke and I think it is highly unlikely that she ever will quit. [name] at very high risk of recurrent restenosis with ongoing tobacco abuse. She not only is limited by leg pain but also dyspnea and generalized weakness, and I would be in favor of a period of observation before performing repeat intervention. I like to see her back in 6 months for followup to see if things remain stable or if she has progressive signs of arterial insufficiency. Of note she does not have any evidence of critical limb ischemia at this point. She will continue on aspirin for antiplatelet therapy. [name] on Crestor for treatment of her hyperlipidemia. She continues to follow with Dr. Riley Kill for treatment of her cardiac disease.

## 2011-03-27 ENCOUNTER — Other Ambulatory Visit: Payer: Self-pay | Admitting: Cardiology

## 2011-03-29 ENCOUNTER — Telehealth: Payer: Self-pay | Admitting: Cardiovascular Disease

## 2011-03-29 NOTE — Telephone Encounter (Signed)
Pt is returning your call

## 2011-03-29 NOTE — Telephone Encounter (Signed)
I did not call the pt.  The pt is unsure why someone tried to call her.

## 2011-06-19 ENCOUNTER — Ambulatory Visit (INDEPENDENT_AMBULATORY_CARE_PROVIDER_SITE_OTHER): Payer: Medicare Other | Admitting: Cardiology

## 2011-06-19 ENCOUNTER — Encounter: Payer: Self-pay | Admitting: Cardiology

## 2011-06-19 VITALS — BP 106/68 | HR 92 | Ht 64.0 in | Wt 167.0 lb

## 2011-06-19 DIAGNOSIS — I2510 Atherosclerotic heart disease of native coronary artery without angina pectoris: Secondary | ICD-10-CM

## 2011-06-19 DIAGNOSIS — I739 Peripheral vascular disease, unspecified: Secondary | ICD-10-CM

## 2011-06-19 DIAGNOSIS — J449 Chronic obstructive pulmonary disease, unspecified: Secondary | ICD-10-CM

## 2011-06-19 DIAGNOSIS — Z87898 Personal history of other specified conditions: Secondary | ICD-10-CM

## 2011-06-19 NOTE — Patient Instructions (Signed)
Your physician wants you to follow-up in: 6 MONTHS.  You will receive a reminder letter in the mail two months in advance. If you don't receive a letter, please call our office to schedule the follow-up appointment.  Your physician recommends that you continue on your current medications as directed. Please refer to the Current Medication list given to you today.  

## 2011-06-19 NOTE — Progress Notes (Signed)
HPI:  She has been on amitryptiline, and the dose was recently increased.  She says it makes her crazy.  But she also stopped her Prednisone, prescribed by Dr. Orson Aloe  (now Juanetta Gosling), and she has only taken once this week.  She has occasional chest pain, but not more than about twice per month.  [name] easily relieved by NTG.  She continues to smoke over one and one half packs per day.   Current Outpatient Prescriptions  Medication Sig Dispense Refill  . albuterol (VENTOLIN HFA) 108 (90 BASE) MCG/ACT inhaler Inhale 2 puffs into the lungs 3 (three) times daily.        [name] ALPRAZolam (XANAX) 1 MG tablet Take 1 mg by mouth 3 (three) times daily.        [name] amitriptyline (ELAVIL) 25 MG tablet Take 25 mg by mouth at bedtime.        [name] aspirin 81 MG EC tablet Take 81 mg by mouth daily.        [name] buPROPion (WELLBUTRIN SR) 150 MG 12 hr tablet Take 150 mg by mouth daily.        . diazepam (VALIUM) 5 MG tablet Take 5 mg by mouth 3 (three) times daily.        [name] esomeprazole (NEXIUM) 40 MG capsule Take 40 mg by mouth 2 (two) times daily.       . fluorometholone (FML) 0.1 % ophthalmic suspension Place 1 drop into both eyes 2 (two) times daily.        [name] LOPRESSOR 50 MG tablet TAKE (1/2) TABLET BY     MOUTH TWICE DAILY.  30 each  12  . Multiple Vitamin (MULTIVITAMIN) tablet Take 1 tablet by mouth daily.        [name] NITROSTAT 0.4 MG SL tablet DISSOLVE 1 TABLET UNDER  TONGUE EVERY 5 MINUTES UPTO 15 MINUTES FOR CHEST  PAIN. IF NO RELIEF CALL  25 each  10  . Omega-3 Fatty Acids (FISH OIL) 1000 MG CAPS Take 1 capsule by mouth daily.        [name] oxyCODONE (OXYCONTIN) 40 MG 12 hr tablet Take 80 mg by mouth as needed.       [name] oxycodone (ROXICODONE) 30 MG immediate release tablet Take 30 mg by mouth every 4 (four) hours as needed.        . predniSONE (DELTASONE) 20 MG tablet Take 20 mg by mouth daily.        . rosuvastatin (CRESTOR) 40 MG tablet Take 40 mg by mouth daily.        [name] sulfamethoxazole-trimethoprim  (BACTRIM,SEPTRA) 400-80 MG per tablet Take 2 tablets by mouth. Every Saturday and Sunday       . vitamin A 16109 UNIT capsule Take 10,000 Units by mouth daily.        . vitamin B-12 (CYANOCOBALAMIN) 500 MCG tablet Take 500 mcg by mouth daily.          No Known Allergies  Past Medical History  Diagnosis Date  . Coronary artery disease     status post stenting of the aortic and iliac vessels by Dr. Allyson Sabal. Repeat aortic stenting 2010  . Left ventricular dysfunction     hx of with ejection fraction of 35% range.  . Carotid artery disease     hx of carotid cerebrovascular disease with 0-39% bilateral internal carotid artery setenosis   . Non Hodgkin's lymphoma   . Chronic back pain     status post lumbar surgery  .  Hyperlipidemia   . Hypertension   . COPD (chronic obstructive pulmonary disease)     Past Surgical History  Procedure Date  . Lumar surg     Family History  Problem Relation Age of Onset  . Coronary artery disease      positive for vascular and cardiac disease    History   Social History  . Marital Status: Single    Spouse Name: N/A    Number of Children: 2  . Years of Education: N/A   Occupational History  . retired    Social History Main Topics  . Smoking status: Current Everyday Smoker -- 1.0 packs/day for 20 years    Types: Cigarettes  . Smokeless tobacco: Never Used   Comment: smokes 1 pack a day  . Alcohol Use: No  . Drug Use: No  . Sexually Active: Not on file   Other Topics Concern  . Not on file   Social History Narrative  . No narrative on file    ROS: Please see the HPI.  All other systems reviewed and negative.  PHYSICAL EXAM:  BP 106/68  Pulse 92  Ht 5\' 4"  (1.626 m)  Wt 75.751 kg (167 lb)  BMI 28.67 kg/m2  General: Well developed, well nourished, in no acute distress. Head:  Normocephalic and atraumatic. Neck: no JVD Lungs: Clear to auscultation and percussion. Heart: Normal S1 and S2.  No murmur, rubs or gallops.    Abdomen:  Normal bowel sounds; soft; non tender; no organomegaly Pulses: Pulses normal in all 4 extremities. Extremities: No clubbing or cyanosis. No edema. Neurologic: Alert and oriented x 3.  EKG: NSR.  Nonspecific ST abnormality  ASSESSMENT AND PLAN:

## 2011-06-19 NOTE — Progress Notes (Signed)
Patient ID: [name], female   DOB: [birth year], 55 y.o.   MRN: [id number]

## 2011-06-29 ENCOUNTER — Emergency Department (HOSPITAL_COMMUNITY)
Admission: EM | Admit: 2011-06-29 | Discharge: 2011-06-29 | Disposition: A | Discharge status: Home / Self Care | Payer: Medicare Other

## 2011-06-29 NOTE — ED Notes (Signed)
No answer in waiting room 

## 2011-07-01 NOTE — Assessment & Plan Note (Signed)
Currently continues to smoke.  She understands consequences of this.  Vitals currently stable.  No further workup unless significant symptoms.

## 2011-07-01 NOTE — Assessment & Plan Note (Addendum)
Has been unable to stop smoking.  Has sig COPD.  On recent dose of steroids, and hard to get from her if she is taking, partly taking, intermittently taking, or regularly taking.  Answers vary. I did remind her of the potential for adrenal insufficiency if it is suddenly stopped, or sporadic. She understands, or at least acknowledges it.

## 2011-07-01 NOTE — Assessment & Plan Note (Addendum)
Has had sig aortic disease--see Dr. Excell Seltzer note. Some progression, but given situation, conservative management, with which I agree at present.  TS

## 2011-07-01 NOTE — Assessment & Plan Note (Signed)
Continuing to follow in [place].

## 2011-07-26 ENCOUNTER — Telehealth: Payer: Self-pay | Admitting: Cardiovascular Disease

## 2011-07-26 NOTE — Telephone Encounter (Signed)
I spoke with the pt and she is already scheduled to see Dr Excell Seltzer on 07/27/11.  I spoke with the pt and she will keep her appointment.

## 2011-07-26 NOTE — Telephone Encounter (Signed)
Pt calling re an ulcer on her leg and wants to see cooper, should she him or someone else?

## 2011-07-27 ENCOUNTER — Encounter: Payer: Self-pay | Admitting: Cardiovascular Disease

## 2011-07-27 ENCOUNTER — Ambulatory Visit (INDEPENDENT_AMBULATORY_CARE_PROVIDER_SITE_OTHER): Payer: Medicare Other | Admitting: Cardiovascular Disease

## 2011-07-27 VITALS — BP 110/68 | HR 58 | Ht 64.0 in | Wt 163.0 lb

## 2011-07-27 DIAGNOSIS — I70229 Atherosclerosis of native arteries of extremities with rest pain, unspecified extremity: Secondary | ICD-10-CM

## 2011-07-27 NOTE — Patient Instructions (Signed)
Your physician has requested that you have a peripheral vascular angiogram. This exam is performed at the hospital. During this exam IV contrast is used to look at arterial blood flow. Please review the information sheet given for details.  Your physician recommends that you continue on your current medications as directed. Please refer to the Current Medication list given to you today.  

## 2011-07-27 NOTE — Progress Notes (Signed)
HPI:  [name] returns for followup today. [name] a 55-year-old woman with severe vascular disease.  She has undergone distal aortic and bilateral iliac stenting. She has long-standing bilateral intermittent claudication. About 2 months ago she abraded the skin on her right lower leg and has not healed in the interim. She has completed a few courses of antibiotics. She's been seen at the wound care center and was referred back because of nonpalpable pulses in her foot and a nonhealing wound. She has developed rest pain in her right leg and is very uncomfortable with walking. Her left leg symptoms have been mild. She continues to smoke cigarettes. Her last ABIs in September 2012 or 79% on the right at 84% on the left. Her waveforms suggested significant inflow disease. The patient has chronic dyspnea. She denies chest pain.  Outpatient Encounter Prescriptions as of 07/27/2011  Medication Sig Dispense Refill  . albuterol (VENTOLIN HFA) 108 (90 BASE) MCG/ACT inhaler Inhale 2 puffs into the lungs 3 (three) times daily.        . ALPRAZolam (XANAX) 1 MG tablet Take 1 mg by mouth 3 (three) times daily.        . aspirin 81 MG EC tablet Take 81 mg by mouth daily.        . buPROPion (WELLBUTRIN SR) 150 MG 12 hr tablet Take 150 mg by mouth daily.        . cycloSPORINE (RESTASIS) 0.05 % ophthalmic emulsion Place 1 drop into both eyes 2 (two) times daily.      . diazepam (VALIUM) 5 MG tablet Take 5 mg by mouth 3 (three) times daily.        . esomeprazole (NEXIUM) 40 MG capsule Take 40 mg by mouth 2 (two) times daily.       . fluorometholone (FML) 0.1 % ophthalmic suspension Place 1 drop into both eyes 2 (two) times daily.        . LOPRESSOR 50 MG tablet TAKE (1/2) TABLET BY     MOUTH TWICE DAILY.  30 each  12  . Multiple Vitamin (MULTIVITAMIN) tablet Take 1 tablet by mouth daily.        . NITROSTAT 0.4 MG SL tablet DISSOLVE 1 TABLET UNDER  TONGUE EVERY 5 MINUTES UPTO 15 MINUTES FOR CHEST  PAIN. IF NO RELIEF CALL  25  each  10  . Omega-3 Fatty Acids (FISH OIL) 1000 MG CAPS Take 1 capsule by mouth daily.        . oxyCODONE (OXYCONTIN) 40 MG 12 hr tablet Take 80 mg by mouth as needed.       . oxycodone (ROXICODONE) 30 MG immediate release tablet Take 30 mg by mouth every 4 (four) hours as needed.        . rosuvastatin (CRESTOR) 40 MG tablet Take 40 mg by mouth daily.        . sulfamethoxazole-trimethoprim (BACTRIM,SEPTRA) 400-80 MG per tablet Take 2 tablets by mouth. Every Saturday and Sunday       . vitamin A 10000 UNIT capsule Take 10,000 Units by mouth daily.        . vitamin B-12 (CYANOCOBALAMIN) 500 MCG tablet Take 500 mcg by mouth daily.        . DISCONTD: amitriptyline (ELAVIL) 25 MG tablet Take 25 mg by mouth at bedtime.        . DISCONTD: predniSONE (DELTASONE) 20 MG tablet Take 20 mg by mouth daily.          No Known Allergies    Past Medical History  Diagnosis Date  . Coronary artery disease     status post stenting of the aortic and iliac vessels by Dr. Berry. Repeat aortic stenting 2010  . Left ventricular dysfunction     hx of with ejection fraction of 35% range.  . Carotid artery disease     hx of carotid cerebrovascular disease with 0-39% bilateral internal carotid artery setenosis   . Non Hodgkin's lymphoma   . Chronic back pain     status post lumbar surgery  . Hyperlipidemia   . Hypertension   . COPD (chronic obstructive pulmonary disease)     ROS: Negative except as per HPI  Ht 5' 4" (1.626 m)  Wt 73.936 kg (163 lb)  BMI 27.98 kg/m2  PHYSICAL EXAM: Pt is alert and oriented, chronically ill-appearing woman in NAD HEENT: normal Neck: JVP - normal Lungs: CTA bilaterally CV: RRR without murmur or gallop Abd: soft, NT, Positive BS, no hepatomegaly Ext: there is a skin tear over the right shin without surrounding ecchymosis or discharge. There is 1+ edema of the right lower leg with tenderness diffusely. Pulses in the right leg are nonpalpable, but the foot is warm. I was not  able to get a Doppler signal at the dorsalis pedis or posterior tibial site. On the left, the foot is warm without palpable pulses. The left posterior tibial is easily obtained by Doppler. The dorsalis pedis is not obtainable by Doppler.  ASSESSMENT AND PLAN:  

## 2011-07-27 NOTE — Assessment & Plan Note (Signed)
The patient has clinical evidence of progressive arterial insufficiency of the right leg now with a nonhealing wound on her right lower leg and rest pain. I have recommended that we proceed with angiography and an eye toward PTA. [name] probably not a candidate for vascular surgery because of her severe medical comorbidities and I think we should do all we can with endovascular treatment at this point. I spoke with her again about smoking but I don't think she's capable of quitting. I reviewed the risks, indication, and alternatives to angiography with PTA and stenting and she understands and agrees to proceed.

## 2011-08-02 ENCOUNTER — Encounter (HOSPITAL_COMMUNITY): Payer: Self-pay | Admitting: Respiratory Therapy

## 2011-08-04 ENCOUNTER — Other Ambulatory Visit: Payer: Self-pay | Admitting: Cardiovascular Disease

## 2011-08-06 ENCOUNTER — Encounter (HOSPITAL_COMMUNITY): Payer: Self-pay

## 2011-08-06 ENCOUNTER — Emergency Department (HOSPITAL_COMMUNITY)
Admission: EM | Admit: 2011-08-06 | Discharge: 2011-08-06 | Disposition: A | Discharge status: Home / Self Care | Payer: Medicare Other | Attending: Emergency Medicine | Admitting: Emergency Medicine

## 2011-08-06 DIAGNOSIS — Z5189 Encounter for other specified aftercare: Secondary | ICD-10-CM

## 2011-08-06 DIAGNOSIS — J449 Chronic obstructive pulmonary disease, unspecified: Secondary | ICD-10-CM | POA: Insufficient documentation

## 2011-08-06 DIAGNOSIS — E785 Hyperlipidemia, unspecified: Secondary | ICD-10-CM | POA: Insufficient documentation

## 2011-08-06 DIAGNOSIS — I658 Occlusion and stenosis of other precerebral arteries: Secondary | ICD-10-CM | POA: Insufficient documentation

## 2011-08-06 DIAGNOSIS — Z09 Encounter for follow-up examination after completed treatment for conditions other than malignant neoplasm: Secondary | ICD-10-CM | POA: Insufficient documentation

## 2011-08-06 DIAGNOSIS — I10 Essential (primary) hypertension: Secondary | ICD-10-CM | POA: Insufficient documentation

## 2011-08-06 DIAGNOSIS — I6529 Occlusion and stenosis of unspecified carotid artery: Secondary | ICD-10-CM | POA: Insufficient documentation

## 2011-08-06 DIAGNOSIS — J4489 Other specified chronic obstructive pulmonary disease: Secondary | ICD-10-CM | POA: Insufficient documentation

## 2011-08-06 DIAGNOSIS — I2510 Atherosclerotic heart disease of native coronary artery without angina pectoris: Secondary | ICD-10-CM | POA: Insufficient documentation

## 2011-08-06 DIAGNOSIS — L97809 Non-pressure chronic ulcer of other part of unspecified lower leg with unspecified severity: Secondary | ICD-10-CM | POA: Insufficient documentation

## 2011-08-06 LAB — DIFFERENTIAL
Basophils Absolute: 0 10*3/uL (ref 0.0–0.1)
Eosinophils Relative: 1 % (ref 0–5)
Lymphocytes Relative: 22 % (ref 12–46)
Lymphs Abs: 1.4 10*3/uL (ref 0.7–4.0)
Monocytes Absolute: 0.7 10*3/uL (ref 0.1–1.0)

## 2011-08-06 LAB — COMPREHENSIVE METABOLIC PANEL
BUN: 14 mg/dL (ref 6–23)
CO2: 33 mEq/L — ABNORMAL HIGH (ref 19–32)
Calcium: 9.8 mg/dL (ref 8.4–10.5)
Creatinine, Ser: 0.8 mg/dL (ref 0.50–1.10)
GFR calc Af Amer: >90 mL/min (ref >90)
GFR calc non Af Amer: 83 mL/min — ABNORMAL LOW (ref >90)
Glucose, Bld: 84 mg/dL (ref 70–99)

## 2011-08-06 LAB — CBC
HCT: 39.5 % (ref 36.0–46.0)
MCV: 99.5 fL (ref 78.0–100.0)
RDW: 14.8 % (ref 11.5–15.5)
WBC: 6.4 10*3/uL (ref 4.0–10.5)

## 2011-08-06 MED ORDER — OXYCODONE-ACETAMINOPHEN 5-325 MG PO TABS
1.0000 | ORAL_TABLET | Freq: Once | ORAL | Status: AC
  Administered 2011-08-06: 1 via ORAL
  Filled 2011-08-06: qty 1

## 2011-08-06 MED ORDER — HYDROMORPHONE HCL PF 1 MG/ML IJ SOLN: 1.0000 mg | Freq: Once | INTRAMUSCULAR | Status: DC

## 2011-08-06 NOTE — ED Notes (Signed)
Pt here for wound check on right leg for three months now, redness and very black area to center.

## 2011-08-06 NOTE — ED Provider Notes (Signed)
History     CSN: [id number]  Arrival date & time 08/06/11  1322   First MD Initiated Contact with Patient 08/06/11 1506      Chief Complaint  Patient presents with  . Wound Check    (Consider location/radiation/quality/duration/timing/severity/associated sxs/prior treatment) HPI  Past Medical History  Diagnosis Date  . Coronary artery disease     status post stenting of the aortic and iliac vessels by Dr. Allyson Sabal. Repeat aortic stenting 2010  . Left ventricular dysfunction     hx of with ejection fraction of 35% range.  . Carotid artery disease     hx of carotid cerebrovascular disease with 0-39% bilateral internal carotid artery setenosis   . Non Hodgkin's lymphoma   . Chronic back pain     status post lumbar surgery  . Hyperlipidemia   . Hypertension   . COPD (chronic obstructive pulmonary disease)     Past Surgical History  Procedure Date  . Lumar surg     Family History  Problem Relation Age of Onset  . Coronary artery disease      positive for vascular and cardiac disease    History  Substance Use Topics  . Smoking status: Current Everyday Smoker -- 1.0 packs/day for 20 years    Types: Cigarettes  . Smokeless tobacco: Never Used   Comment: smokes 1 pack a day  . Alcohol Use: No    OB History    Grav Para Term Preterm Abortions TAB SAB Ect Mult Living                  Review of Systems  Allergies  Review of patient's allergies indicates no known allergies.  Home Medications   Current Outpatient Rx  Name Route Sig Dispense Refill  . ALBUTEROL SULFATE HFA 108 (90 BASE) MCG/ACT IN AERS Inhalation Inhale 2 puffs into the lungs 3 (three) times daily.      [name] ALPRAZOLAM 1 MG PO TABS Oral Take 1 mg by mouth 3 (three) times daily.      . ASPIRIN 81 MG PO TBEC Oral Take 81 mg by mouth daily.      . BUPROPION HCL ER (SR) 150 MG PO TB12 Oral Take 150 mg by mouth daily.      [name] VITAMIN D 1000 UNITS PO TABS Oral Take 2,000 Units by mouth daily.     .  CYCLOSPORINE 0.05 % OP EMUL Both Eyes Place 1 drop into both eyes 2 (two) times daily.    [name] DIAZEPAM 5 MG PO TABS Oral Take 5 mg by mouth 3 (three) times daily.      [name] DOXYCYCLINE HYCLATE 100 MG PO TBEC Oral Take 100 mg by mouth 2 (two) times daily. Take for 10 days. Rx. Filled on 08/01/11    . ESOMEPRAZOLE MAGNESIUM 40 MG PO CPDR Oral Take 40 mg by mouth 2 (two) times daily.     [name] FLUOROMETHOLONE 0.1 % OP SUSP Both Eyes Place 1 drop into both eyes 2 (two) times daily.      [name] METOPROLOL TARTRATE 50 MG PO TABS Oral Take 25 mg by mouth 2 (two) times daily.    [name] ONE-DAILY MULTI VITAMINS PO TABS Oral Take 1 tablet by mouth daily.      [name] NITROGLYCERIN 0.4 MG SL SUBL Sublingual Place 0.4 mg under the tongue every 5 (five) minutes as needed. For chest pain    . FISH OIL 1000 MG PO CAPS Oral Take 1 capsule by mouth daily.      [name]  OVER THE COUNTER MEDICATION Oral Take 45 mcg by mouth daily. Vitamin K2    . OXYCODONE HCL ER 40 MG PO TB12 Oral Take 80 mg by mouth every 12 (twelve) hours as needed. For pain    . OXYCODONE HCL 30 MG PO TABS Oral Take 30 mg by mouth every 4 (four) hours as needed. For pain    . ROSUVASTATIN CALCIUM 40 MG PO TABS Oral Take 40 mg by mouth daily.      . SULFAMETHOXAZOLE-TRIMETHOPRIM 400-80 MG PO TABS Oral Take 2 tablets by mouth. Every Saturday and Sunday     . VITAMIN A 32440 UNITS PO CAPS Oral Take 10,000 Units by mouth daily.      [name] VITAMIN B-12 500 MCG PO TABS Oral Take 500 mcg by mouth daily.        BP 116/60  Pulse 86  Temp(Src) 97.1 F (36.2 C) (Oral)  Resp 17  SpO2 96%  Physical Exam  ED Course  Procedures (including critical care time)  Labs Reviewed  COMPREHENSIVE METABOLIC PANEL - Abnormal; Notable for the following:    CO2 33 (*)    Total Protein 5.8 (*)    Albumin 2.9 (*)    GFR calc non Af Amer 83 (*)    All other components within normal limits  CBC  DIFFERENTIAL  COMPREHENSIVE METABOLIC PANEL   No results found.   No diagnosis  found.    MDM  I have taken over this patient from Dr. Jeraldine Loots, please see his note for HPI, ROS and PE.  I have reviewed the patient's CMP which shows no elevation in transaminases.  I will discharge the patient home - she has follow up on Wednesday with [place].        Izola Price [place], [place] 08/06/11 2050

## 2011-08-06 NOTE — ED Provider Notes (Signed)
History     CSN: [id number]  Arrival date & time 08/06/11  1322   First MD Initiated Contact with Patient 08/06/11 1506      Chief Complaint  Patient presents with  . Wound Check    HPI This patient with notable peripheral vascular disease, chronic nonhealing right leg ulcer now presents with concerns over increasing discomfort about the ulcer.  She notes that her ulcer has been there for at least 3 months.  [name] planning to undergo angiography in 2 days.  She now notes over the past 3 or 4 days she has had increasing discomfort about the right lower extremity, with mild increase in erythema about the wound.  She denies any fevers, vomiting, diarrhea, chills.  At baseline the patient has dysesthesia in the distal extremities bilaterally, this is persistent. No clear alleviating or exacerbating factors.  Notably, the patient has previously been anticoagulated, but given her history of prior hemorrhage she is no longer a candidate for anticoagulants.. Past Medical History  Diagnosis Date  . Coronary artery disease     status post stenting of the aortic and iliac vessels by Dr. Allyson Sabal. Repeat aortic stenting 2010  . Left ventricular dysfunction     hx of with ejection fraction of 35% range.  . Carotid artery disease     hx of carotid cerebrovascular disease with 0-39% bilateral internal carotid artery setenosis   . Non Hodgkin's lymphoma   . Chronic back pain     status post lumbar surgery  . Hyperlipidemia   . Hypertension   . COPD (chronic obstructive pulmonary disease)     Past Surgical History  Procedure Date  . Lumar surg     Family History  Problem Relation Age of Onset  . Coronary artery disease      positive for vascular and cardiac disease    History  Substance Use Topics  . Smoking status: Current Everyday Smoker -- 1.0 packs/day for 20 years    Types: Cigarettes  . Smokeless tobacco: Never Used   Comment: smokes 1 pack a day  . Alcohol Use: No    OB  History    Grav Para Term Preterm Abortions TAB SAB Ect Mult Living                  Review of Systems  Constitutional:       HPI  HENT:       HPI otherwise negative  Eyes: Negative.   Respiratory:       HPI, otherwise negative  Cardiovascular:       HPI, otherwise nmegative  Gastrointestinal: Negative for vomiting.  Genitourinary:       HPI, otherwise negative  Musculoskeletal:       HPI, otherwise negative  Skin: Negative.   Neurological: Negative for syncope.    Allergies  Review of patient's allergies indicates no known allergies.  Home Medications   Current Outpatient Rx  Name Route Sig Dispense Refill  . ALBUTEROL SULFATE HFA 108 (90 BASE) MCG/ACT IN AERS Inhalation Inhale 2 puffs into the lungs 3 (three) times daily.      [name] ALPRAZOLAM 1 MG PO TABS Oral Take 1 mg by mouth 3 (three) times daily.      . ASPIRIN 81 MG PO TBEC Oral Take 81 mg by mouth daily.      . BUPROPION HCL ER (SR) 150 MG PO TB12 Oral Take 150 mg by mouth daily.      [name] VITAMIN D  1000 UNITS PO TABS Oral Take 2,000 Units by mouth daily.     . CYCLOSPORINE 0.05 % OP EMUL Both Eyes Place 1 drop into both eyes 2 (two) times daily.    [name] DIAZEPAM 5 MG PO TABS Oral Take 5 mg by mouth 3 (three) times daily.      [name] DOXYCYCLINE HYCLATE 100 MG PO TBEC Oral Take 100 mg by mouth 2 (two) times daily. Take for 10 days. Rx. Filled on 08/01/11    . ESOMEPRAZOLE MAGNESIUM 40 MG PO CPDR Oral Take 40 mg by mouth 2 (two) times daily.     [name] FLUOROMETHOLONE 0.1 % OP SUSP Both Eyes Place 1 drop into both eyes 2 (two) times daily.      [name] METOPROLOL TARTRATE 50 MG PO TABS Oral Take 25 mg by mouth 2 (two) times daily.    [name] ONE-DAILY MULTI VITAMINS PO TABS Oral Take 1 tablet by mouth daily.      [name] NITROGLYCERIN 0.4 MG SL SUBL Sublingual Place 0.4 mg under the tongue every 5 (five) minutes as needed. For chest pain    . FISH OIL 1000 MG PO CAPS Oral Take 1 capsule by mouth daily.      [name] OVER THE COUNTER MEDICATION Oral Take 45  mcg by mouth daily. Vitamin K2    . OXYCODONE HCL ER 40 MG PO TB12 Oral Take 80 mg by mouth every 12 (twelve) hours as needed. For pain    . OXYCODONE HCL 30 MG PO TABS Oral Take 30 mg by mouth every 4 (four) hours as needed. For pain    . ROSUVASTATIN CALCIUM 40 MG PO TABS Oral Take 40 mg by mouth daily.      . SULFAMETHOXAZOLE-TRIMETHOPRIM 400-80 MG PO TABS Oral Take 2 tablets by mouth. Every Saturday and Sunday     . VITAMIN A 16109 UNITS PO CAPS Oral Take 10,000 Units by mouth daily.      [name] VITAMIN B-12 500 MCG PO TABS Oral Take 500 mcg by mouth daily.        BP 116/60  Pulse 86  Temp(Src) 97.1 F (36.2 C) (Oral)  Resp 17  SpO2 96%  Physical Exam  Constitutional: No distress.       Patient appears older than her given age  HENT:  Head: Normocephalic and atraumatic.  Eyes: Conjunctivae and EOM are normal.  Cardiovascular: Normal rate and regular rhythm.   Pulmonary/Chest: Effort normal. No stridor.  Abdominal: She exhibits no distension.  Musculoskeletal:       Legs:      The feet are both warm, with palpable DP pulses.  The patient has difficulty moving her right toes, no difficulty moving her left toes  Skin: [name] not diaphoretic.       Multiple areas of ecchymosis, pinpoint purpura diffusely, without confluence    ED Course  Procedures (including critical care time)   Labs Reviewed  CBC  DIFFERENTIAL  COMPREHENSIVE METABOLIC PANEL   No results found.   No diagnosis found.   Smoking cessation counseling provided MDM  This elderly appearing female now presents with concerns over a right lower extremity wound that is slow to heal with new discoloration distally.  On exam she is in no distress, with palpable distal pulses, erythematous right foot.  The patient's feet are the same temperature, she has unremarkable vital signs, and her physical exam is seemingly similar to that on prior evaluations, on chart review.  The patient has unremarkable  labs.  [name]  scheduled for angiography in 2 days, and is stable to continue to that outpatient evaluation of her known, previously demonstrated peripheral vascular disease.        Gerhard Munch, MD 08/08/11 2047

## 2011-08-08 ENCOUNTER — Encounter (HOSPITAL_COMMUNITY): Payer: Self-pay | Admitting: General Practice

## 2011-08-08 ENCOUNTER — Encounter (HOSPITAL_COMMUNITY)
Admission: RE | Disposition: A | Discharge status: Home / Self Care | Payer: Self-pay | Source: Ambulatory Visit | Attending: Cardiovascular Disease

## 2011-08-08 ENCOUNTER — Ambulatory Visit (HOSPITAL_COMMUNITY)
Admission: RE | Admit: 2011-08-08 | Discharge: 2011-08-09 | Disposition: A | Discharge status: Home / Self Care | Payer: Medicare Other | Source: Ambulatory Visit | Attending: Cardiovascular Disease | Admitting: Cardiovascular Disease

## 2011-08-08 DIAGNOSIS — I70229 Atherosclerosis of native arteries of extremities with rest pain, unspecified extremity: Secondary | ICD-10-CM | POA: Insufficient documentation

## 2011-08-08 DIAGNOSIS — L98499 Non-pressure chronic ulcer of skin of other sites with unspecified severity: Secondary | ICD-10-CM

## 2011-08-08 DIAGNOSIS — E785 Hyperlipidemia, unspecified: Secondary | ICD-10-CM | POA: Insufficient documentation

## 2011-08-08 DIAGNOSIS — I10 Essential (primary) hypertension: Secondary | ICD-10-CM | POA: Insufficient documentation

## 2011-08-08 DIAGNOSIS — I700 Atherosclerosis of aorta: Secondary | ICD-10-CM | POA: Insufficient documentation

## 2011-08-08 DIAGNOSIS — I739 Peripheral vascular disease, unspecified: Secondary | ICD-10-CM

## 2011-08-08 HISTORY — DX: Encounter for other specified aftercare: Z51.89

## 2011-08-08 HISTORY — DX: Acute myocardial infarction, unspecified: I21.9

## 2011-08-08 HISTORY — DX: Malignant (primary) neoplasm, unspecified: C80.1

## 2011-08-08 HISTORY — DX: Heart failure, unspecified: I50.9

## 2011-08-08 HISTORY — PX: ABDOMINAL AORTAGRAM: SHX5454

## 2011-08-08 HISTORY — DX: Shortness of breath: R06.02

## 2011-08-08 HISTORY — PX: PERCUTANEOUS STENT INTERVENTION: SHX5500

## 2011-08-08 HISTORY — PX: OTHER SURGICAL HISTORY: SHX169

## 2011-08-08 HISTORY — DX: Peripheral vascular disease, unspecified: I73.9

## 2011-08-08 HISTORY — DX: Cerebral infarction, unspecified: I63.9

## 2011-08-08 HISTORY — DX: Reserved for inherently not codable concepts without codable children: IMO0001

## 2011-08-08 HISTORY — DX: Unspecified osteoarthritis, unspecified site: M19.90

## 2011-08-08 LAB — PROTIME-INR: INR: 1.01 (ref 0.00–1.49)

## 2011-08-08 SURGERY — ABDOMINAL AORTAGRAM
Anesthesia: LOCAL

## 2011-08-08 MED ORDER — LIDOCAINE HCL (PF) 1 % IJ SOLN
INTRAMUSCULAR | Status: AC
  Filled 2011-08-08: qty 30

## 2011-08-08 MED ORDER — CYANOCOBALAMIN 500 MCG PO TABS
500.0000 ug | ORAL_TABLET | Freq: Every day | ORAL | Status: DC
  Administered 2011-08-09: 500 ug via ORAL
  Filled 2011-08-08: qty 1

## 2011-08-08 MED ORDER — DOXYCYCLINE HYCLATE 100 MG PO TBEC: 100.0000 mg | DELAYED_RELEASE_TABLET | Freq: Two times a day (BID) | ORAL | Status: DC

## 2011-08-08 MED ORDER — ALBUTEROL SULFATE HFA 108 (90 BASE) MCG/ACT IN AERS
2.0000 | INHALATION_SPRAY | Freq: Three times a day (TID) | RESPIRATORY_TRACT | Status: DC
  Administered 2011-08-08 – 2011-08-09 (×2): 2 via RESPIRATORY_TRACT
  Filled 2011-08-08: qty 6.7

## 2011-08-08 MED ORDER — SULFAMETHOXAZOLE-TRIMETHOPRIM 400-80 MG PO TABS
1.0000 | ORAL_TABLET | Freq: Two times a day (BID) | ORAL | Status: DC
  Administered 2011-08-08 – 2011-08-09 (×2): 1 via ORAL
  Filled 2011-08-08 (×3): qty 1

## 2011-08-08 MED ORDER — ASPIRIN 81 MG PO TBEC: 81.0000 mg | DELAYED_RELEASE_TABLET | Freq: Every day | ORAL | Status: DC

## 2011-08-08 MED ORDER — OXYCODONE HCL 80 MG PO TB12: 80.0000 mg | ORAL_TABLET | Freq: Two times a day (BID) | ORAL | Status: DC

## 2011-08-08 MED ORDER — SODIUM CHLORIDE 0.9 % IV SOLN
INTRAVENOUS | Status: DC
  Administered 2011-08-08: 10:00:00 via INTRAVENOUS

## 2011-08-08 MED ORDER — ROSUVASTATIN CALCIUM 40 MG PO TABS
40.0000 mg | ORAL_TABLET | Freq: Every day | ORAL | Status: DC
  Administered 2011-08-09: 40 mg via ORAL
  Filled 2011-08-08: qty 1

## 2011-08-08 MED ORDER — CLOPIDOGREL BISULFATE 300 MG PO TABS
ORAL_TABLET | ORAL | Status: AC
  Administered 2011-08-08: 300 mg via ORAL
  Filled 2011-08-08: qty 1

## 2011-08-08 MED ORDER — SODIUM CHLORIDE 0.9 % IJ SOLN: 3.0000 mL | Freq: Two times a day (BID) | INTRAMUSCULAR | Status: DC

## 2011-08-08 MED ORDER — PANTOPRAZOLE SODIUM 40 MG PO TBEC
40.0000 mg | DELAYED_RELEASE_TABLET | Freq: Every day | ORAL | Status: DC
  Administered 2011-08-09: 40 mg via ORAL
  Filled 2011-08-08: qty 1

## 2011-08-08 MED ORDER — ASPIRIN EC 81 MG PO TBEC
81.0000 mg | DELAYED_RELEASE_TABLET | Freq: Every day | ORAL | Status: DC
  Administered 2011-08-09: 81 mg via ORAL
  Filled 2011-08-08: qty 1

## 2011-08-08 MED ORDER — OXYCODONE HCL 5 MG PO TABS
30.0000 mg | ORAL_TABLET | ORAL | Status: DC | PRN
  Administered 2011-08-08 – 2011-08-09 (×2): 30 mg via ORAL
  Filled 2011-08-08: qty 1
  Filled 2011-08-08: qty 5
  Filled 2011-08-08: qty 6

## 2011-08-08 MED ORDER — SODIUM CHLORIDE 0.9 % IJ SOLN: 3.0000 mL | INTRAMUSCULAR | Status: DC | PRN

## 2011-08-08 MED ORDER — DIAZEPAM 5 MG PO TABS
ORAL_TABLET | ORAL | Status: AC
  Administered 2011-08-08: 5 mg via ORAL
  Filled 2011-08-08: qty 1

## 2011-08-08 MED ORDER — SODIUM CHLORIDE 0.9 % IV SOLN
INTRAVENOUS | Status: DC
  Administered 2011-08-08: 19:00:00 via INTRAVENOUS

## 2011-08-08 MED ORDER — CLOPIDOGREL BISULFATE 300 MG PO TABS
300.0000 mg | ORAL_TABLET | ORAL | Status: AC
  Administered 2011-08-08: 300 mg via ORAL

## 2011-08-08 MED ORDER — ONDANSETRON HCL 4 MG/2ML IJ SOLN: 4.0000 mg | Freq: Four times a day (QID) | INTRAMUSCULAR | Status: DC | PRN

## 2011-08-08 MED ORDER — CYCLOSPORINE 0.05 % OP EMUL
1.0000 [drp] | Freq: Two times a day (BID) | OPHTHALMIC | Status: DC
  Administered 2011-08-08 – 2011-08-09 (×2): 1 [drp] via OPHTHALMIC
  Filled 2011-08-08 (×3): qty 1

## 2011-08-08 MED ORDER — DOXYCYCLINE HYCLATE 100 MG PO TABS
100.0000 mg | ORAL_TABLET | Freq: Two times a day (BID) | ORAL | Status: DC
  Administered 2011-08-08 – 2011-08-09 (×2): 100 mg via ORAL
  Filled 2011-08-08 (×3): qty 1

## 2011-08-08 MED ORDER — CLOPIDOGREL BISULFATE 75 MG PO TABS
75.0000 mg | ORAL_TABLET | Freq: Every day | ORAL | Status: DC
  Administered 2011-08-09: 75 mg via ORAL
  Filled 2011-08-08: qty 1

## 2011-08-08 MED ORDER — ADULT MULTIVITAMIN W/MINERALS CH
1.0000 | ORAL_TABLET | Freq: Every day | ORAL | Status: DC
  Administered 2011-08-09: 1 via ORAL
  Filled 2011-08-08: qty 1

## 2011-08-08 MED ORDER — FLUOROMETHOLONE 0.1 % OP SUSP
1.0000 [drp] | Freq: Two times a day (BID) | OPHTHALMIC | Status: DC
  Administered 2011-08-09: 1 [drp] via OPHTHALMIC
  Filled 2011-08-08: qty 5

## 2011-08-08 MED ORDER — HEPARIN SODIUM (PORCINE) 1000 UNIT/ML IJ SOLN
INTRAMUSCULAR | Status: AC
  Filled 2011-08-08: qty 1

## 2011-08-08 MED ORDER — VITAMIN D3 25 MCG (1000 UNIT) PO TABS
2000.0000 [IU] | ORAL_TABLET | Freq: Every day | ORAL | Status: DC
  Administered 2011-08-09: 2000 [IU] via ORAL
  Filled 2011-08-08: qty 2

## 2011-08-08 MED ORDER — NITROGLYCERIN 0.4 MG SL SUBL: 0.4000 mg | SUBLINGUAL_TABLET | SUBLINGUAL | Status: DC | PRN

## 2011-08-08 MED ORDER — ACETAMINOPHEN 325 MG PO TABS: 650.0000 mg | ORAL_TABLET | ORAL | Status: DC | PRN

## 2011-08-08 MED ORDER — BUPROPION HCL ER (SR) 150 MG PO TB12
150.0000 mg | ORAL_TABLET | Freq: Every day | ORAL | Status: DC
  Administered 2011-08-09: 150 mg via ORAL
  Filled 2011-08-08: qty 1

## 2011-08-08 MED ORDER — MIDAZOLAM HCL 2 MG/2ML IJ SOLN
INTRAMUSCULAR | Status: AC
  Filled 2011-08-08: qty 2

## 2011-08-08 MED ORDER — ASPIRIN 81 MG PO CHEW
324.0000 mg | CHEWABLE_TABLET | ORAL | Status: AC
  Administered 2011-08-08: 324 mg via ORAL

## 2011-08-08 MED ORDER — FENTANYL CITRATE 0.05 MG/ML IJ SOLN
INTRAMUSCULAR | Status: AC
  Filled 2011-08-08: qty 2

## 2011-08-08 MED ORDER — VITAMIN B-12 500 MCG PO TABS
500.0000 ug | ORAL_TABLET | Freq: Every day | ORAL | Status: DC
Start: 2011-08-08 — End: 2011-08-08

## 2011-08-08 MED ORDER — METOPROLOL TARTRATE 25 MG PO TABS
25.0000 mg | ORAL_TABLET | Freq: Two times a day (BID) | ORAL | Status: DC
  Administered 2011-08-09 (×2): 25 mg via ORAL
  Filled 2011-08-08 (×3): qty 1

## 2011-08-08 MED ORDER — OXYCODONE HCL 40 MG PO TB12
80.0000 mg | ORAL_TABLET | Freq: Two times a day (BID) | ORAL | Status: DC
  Administered 2011-08-08: 80 mg via ORAL
  Filled 2011-08-08: qty 2

## 2011-08-08 MED ORDER — ONE-DAILY MULTI VITAMINS PO TABS: 1.0000 | ORAL_TABLET | Freq: Every day | ORAL | Status: DC

## 2011-08-08 MED ORDER — ALPRAZOLAM 0.5 MG PO TABS
1.0000 mg | ORAL_TABLET | Freq: Three times a day (TID) | ORAL | Status: DC
  Administered 2011-08-08: 1 mg via ORAL
  Filled 2011-08-08: qty 2

## 2011-08-08 MED ORDER — ASPIRIN 81 MG PO CHEW
CHEWABLE_TABLET | ORAL | Status: AC
  Filled 2011-08-08: qty 4

## 2011-08-08 MED ORDER — DIAZEPAM 5 MG PO TABS
5.0000 mg | ORAL_TABLET | ORAL | Status: AC
  Administered 2011-08-08: 5 mg via ORAL

## 2011-08-08 MED ORDER — SODIUM CHLORIDE 0.9 % IV SOLN: 250.0000 mL | INTRAVENOUS | Status: DC | PRN

## 2011-08-08 MED ORDER — HEPARIN (PORCINE) IN NACL 2-0.9 UNIT/ML-% IJ SOLN
INTRAMUSCULAR | Status: AC
  Filled 2011-08-08: qty 1000

## 2011-08-08 NOTE — ED Provider Notes (Signed)
Medical screening examination/treatment/procedure(s) were conducted as a shared visit with non-physician practitioner(s) and myself.  I personally evaluated the patient during the encounter Please see my full note regarding this presentation.   Gerhard Munch, MD 08/08/11 2033

## 2011-08-08 NOTE — Progress Notes (Signed)
Pt was very anxious, c/o severe bil leg pain, was given oxycodone and xanax, needed freq reassurance during re-bleeding episodes.  Refused supper. Finally calmed down, woke up from nap confused.  Thought she had already been in hospital overnight and said nobody had given her any of her meds the whole time she had been here.  Asking for xanax and pain med.  Reoriented pt to date and situation.  Evening meds given including Oxycontin CR.  Bil groins soft, Left level 0, Right has small bruise.  Tender to touch.  Eating [place] sandwich, in no apparent distress.  VSS.  Bil DP present per doppler.

## 2011-08-08 NOTE — Interval H&P Note (Signed)
History and Physical Interval Note:  08/08/2011 11:51 AM  [name]  has presented today for surgery, with the diagnosis of PVD  The various methods of treatment have been discussed with the patient and family. After consideration of risks, benefits and other options for treatment, the patient has consented to  Procedure(s): ABDOMINAL AORTAGRAM as a surgical intervention .  The patients' history has been reviewed, patient examined, no change in status, stable for surgery.  I have reviewed the patients' chart and labs.  Questions were answered to the patient's satisfaction.     [name]

## 2011-08-08 NOTE — Procedures (Signed)
Cardiac Catheterization Procedure Note  Name: [name] MRN: [id number] DOB: 29-Sep-1955  Procedure: Abdominal aortogram, bilateral iliac stenting.  Indication: [name] is a 56 year old woman with multiple medical problems. She presents with rest pain and nonhealing ulcer in the right leg. She has had multiple interventions on her legs and has severe peripheral arterial disease with ongoing tobacco use. She was brought in for repeat angiography after her noninvasive studies were suggestive of severe inflow disease.  Procedural details: Both groins were prepped and draped. The left groin was accessed utilizing a 5 [place] sheath. A pigtail catheter was advanced up into the abdominal aorta and aortography was performed. This demonstrated flush occlusion of the right iliac artery and severe stenosis of the left common iliac artery. The occlusion on the right side was within the stented segment and it reconstituted in the distal common iliac artery. I elected to proceed with attempted intervention. The right femoral artery was accessed using fluoroscopic guidance as the pulse was nonpalpable. A 5 French sheath was placed. Weight-based unfractionated heparin was administered. The total occlusion was crossed with an angled Glidewire and this was changed out over an endhole catheter to a Versicore wire.  Once a therapeutic ACT was achieved, the iliac artery was dilated with a 4 x 100 balloon. The vessel was then stented with a 7 x 38 mm atrium iCast covered stent so that the iliac ostium was covered. There was an excellent result. The left iliac artery was then stented with a 8 x 38 mm atrium iCast covered stent. Both sheaths had been upsized to 7 [place] Bright tip sheaths. Following left iliac stenting, the right common iliac origin appeared mildly compromised. There was a nonflow limiting edge dissection on the left. The right common iliac was redilated with the 8 mm balloon but this only shifted plaque  over to the left. Finally, kissing balloons were utilized with the 8 mm balloon on the right and a 7 mm balloon on the left, both dilated to 8 atmospheres. There was an excellent angiographic result with 0% residual stenosis at both stent sites.  Procedural Findings: The patient's distal abdominal aortic stents are patent. The right common iliac is flush occluded just beyond the ostium and it reconstitutes in the distal common iliac artery. The external iliac and internal iliac arteries are small and underfilled.  The left common iliac stent is patent. Just beyond the stented segment there is an eccentric 80% stenosis. The residual portions of the internal and external iliac arteries are diffusely diseased. Both common femoral arteries appear patent.  Final Conclusions:  Severe bilateral iliac stenosis with total occlusion on the right and severe stenosis on the left. Both sides treated successfully with atrium iCast covered stents.  Recommendations: Dual antiplatelet therapy with aspirin and Plavix as long as this patient can tolerate.  [name] 08/08/2011, 1:27 PM

## 2011-08-08 NOTE — Progress Notes (Signed)
Site area: right groin  Site Prior to Removal:  Level 0  Pressure Applied For 20 MINUTES    Minutes Beginning at 1845  Manual:   yes  Patient Status During Pull:  [id number]  Post Pull Groin Site:  Level 1  Post Pull Instructions Given:  yes  Post Pull Pulses Present:  yes  Dressing Applied:  yes  Comments:  rebled after dsg apllied, additional 10 min pressure hold done with success, small bruise no hematoma

## 2011-08-08 NOTE — H&P (View-Only) (Signed)
HPI:  [name] returns for followup today. [name] a 56 year old woman with severe vascular disease.  She has undergone distal aortic and bilateral iliac stenting. She has long-standing bilateral intermittent claudication. About 2 months ago she abraded the skin on her right lower leg and has not healed in the interim. She has completed a few courses of antibiotics. She's been seen at the wound care center and was referred back because of nonpalpable pulses in her foot and a nonhealing wound. She has developed rest pain in her right leg and is very uncomfortable with walking. Her left leg symptoms have been mild. She continues to smoke cigarettes. Her last ABIs in September 2012 or 79% on the right at 84% on the left. Her waveforms suggested significant inflow disease. The patient has chronic dyspnea. She denies chest pain.  Outpatient Encounter Prescriptions as of 07/27/2011  Medication Sig Dispense Refill  . albuterol (VENTOLIN HFA) 108 (90 BASE) MCG/ACT inhaler Inhale 2 puffs into the lungs 3 (three) times daily.        [name] ALPRAZolam (XANAX) 1 MG tablet Take 1 mg by mouth 3 (three) times daily.        [name] aspirin 81 MG EC tablet Take 81 mg by mouth daily.        [name] buPROPion (WELLBUTRIN SR) 150 MG 12 hr tablet Take 150 mg by mouth daily.        . cycloSPORINE (RESTASIS) 0.05 % ophthalmic emulsion Place 1 drop into both eyes 2 (two) times daily.      . diazepam (VALIUM) 5 MG tablet Take 5 mg by mouth 3 (three) times daily.        [name] esomeprazole (NEXIUM) 40 MG capsule Take 40 mg by mouth 2 (two) times daily.       . fluorometholone (FML) 0.1 % ophthalmic suspension Place 1 drop into both eyes 2 (two) times daily.        [name] LOPRESSOR 50 MG tablet TAKE (1/2) TABLET BY     MOUTH TWICE DAILY.  30 each  12  . Multiple Vitamin (MULTIVITAMIN) tablet Take 1 tablet by mouth daily.        [name] NITROSTAT 0.4 MG SL tablet DISSOLVE 1 TABLET UNDER  TONGUE EVERY 5 MINUTES UPTO 15 MINUTES FOR CHEST  PAIN. IF NO RELIEF CALL  25  each  10  . Omega-3 Fatty Acids (FISH OIL) 1000 MG CAPS Take 1 capsule by mouth daily.        [name] oxyCODONE (OXYCONTIN) 40 MG 12 hr tablet Take 80 mg by mouth as needed.       [name] oxycodone (ROXICODONE) 30 MG immediate release tablet Take 30 mg by mouth every 4 (four) hours as needed.        . rosuvastatin (CRESTOR) 40 MG tablet Take 40 mg by mouth daily.        [name] sulfamethoxazole-trimethoprim (BACTRIM,SEPTRA) 400-80 MG per tablet Take 2 tablets by mouth. Every Saturday and Sunday       . vitamin A 16109 UNIT capsule Take 10,000 Units by mouth daily.        . vitamin B-12 (CYANOCOBALAMIN) 500 MCG tablet Take 500 mcg by mouth daily.        [name] DISCONTD: amitriptyline (ELAVIL) 25 MG tablet Take 25 mg by mouth at bedtime.        [name] DISCONTD: predniSONE (DELTASONE) 20 MG tablet Take 20 mg by mouth daily.          No Known Allergies  Past Medical History  Diagnosis Date  . Coronary artery disease     status post stenting of the aortic and iliac vessels by Dr. Allyson Sabal. Repeat aortic stenting 2010  . Left ventricular dysfunction     hx of with ejection fraction of 35% range.  . Carotid artery disease     hx of carotid cerebrovascular disease with 0-39% bilateral internal carotid artery setenosis   . Non Hodgkin's lymphoma   . Chronic back pain     status post lumbar surgery  . Hyperlipidemia   . Hypertension   . COPD (chronic obstructive pulmonary disease)     ROS: Negative except as per HPI  Ht 5\' 4"  (1.626 m)  Wt 73.936 kg (163 lb)  BMI 27.98 kg/m2  PHYSICAL EXAM: Pt is alert and oriented, chronically ill-appearing woman in NAD HEENT: normal Neck: JVP - normal Lungs: CTA bilaterally CV: RRR without murmur or gallop Abd: soft, NT, Positive BS, no hepatomegaly Ext: there is a skin tear over the right shin without surrounding ecchymosis or discharge. There is 1+ edema of the right lower leg with tenderness diffusely. Pulses in the right leg are nonpalpable, but the foot is warm. I was not  able to get a Doppler signal at the dorsalis pedis or posterior tibial site. On the left, the foot is warm without palpable pulses. The left posterior tibial is easily obtained by Doppler. The dorsalis pedis is not obtainable by Doppler.  ASSESSMENT AND PLAN:

## 2011-08-08 NOTE — Progress Notes (Signed)
Site area: left groin  Site Prior to Removal:  Level 0  Pressure Applied For 20 MINUTES    Minutes Beginning at 20  Manual:   yes  Patient Status During Pull:  [id number]  Post Pull Groin Site:  Level 0  Post Pull Instructions Given:  yes  Post Pull Pulses Present:  yes  Dressing Applied:  yes  Comments:  rebled after dsg applied, additional 10 mins pressure hold applied with success. No hematoma no bruise site level 0

## 2011-08-09 DIAGNOSIS — I739 Peripheral vascular disease, unspecified: Secondary | ICD-10-CM

## 2011-08-09 MED ORDER — CLOPIDOGREL BISULFATE 75 MG PO TABS: 75.0000 mg | ORAL_TABLET | Freq: Every day | ORAL | Status: DC

## 2011-08-09 NOTE — Discharge Summary (Signed)
Discharge Summary   Patient ID: [name],  MRN: [id number], DOB/AGE: [birth year] 56 y.o.  Admit date: 08/08/2011 Discharge date: 08/09/2011  Discharge Diagnoses Principal Problem:  *Atherosclerosis of native arteries of the extremities with rest pain Active Problems:  HYPERLIPIDEMIA  HYPERTENSION, BENIGN  AORTIC ATHEROSCLEROSIS   Allergies No Known Allergies  Procedures  Abdominal aortogram, bilateral iliac stenting.  Procedural Findings:  The patient's distal abdominal aortic stents are patent. The right common iliac is flush occluded just beyond the ostium and it reconstitutes in the distal common iliac artery. The external iliac and internal iliac arteries are small and underfilled.  The left common iliac stent is patent. Just beyond the stented segment there is an eccentric 80% stenosis. The residual portions of the internal and external iliac arteries are diffusely diseased. Both common femoral arteries appear patent.  Final Conclusions: Severe bilateral iliac stenosis with total occlusion on the right and severe stenosis on the left. Both sides treated successfully with atrium iCast covered stents.  Recommendations: Dual antiplatelet therapy with aspirin and Plavix as long as this patient can tolerate.  History of Present Illness  [name] is a 56 yo female with PMHx significant for PAD with longstanding bilateral, intermittent claudication (s/p distal aortic and bilateral iliac stenting), tobacco abuse, HTN and HL who underwent elective abdominal aortogram with bilateral iliac stenting.   Per Dr. Earmon Phoenix last office note: About 2 months ago she abraded the skin on her right lower leg and has not healed in the interim. She has completed a few courses of antibiotics. She's been seen at the wound care center and was referred back because of nonpalpable pulses in her foot and a nonhealing wound. She has developed rest pain in her right leg and is very uncomfortable with  walking. Her left leg symptoms have been mild. She continues to smoke cigarettes. Her last ABIs in September 2012 or 79% on the right at 84% on the left. Her waveforms suggested significant inflow disease. The patient has chronic dyspnea. She denied chest pain.  Hospital Course   She was informed, consented and underwent the above procedure which elicited severe bilateral iliac stenosis with total occlusion onf the right and severe stenosis on the lift common iliac arteries, treated successfully with atrium iCast covered stents. The recommendation was made to continue ASA/Plavix x 30 days, but preferably for 6 months in the setting of severe PAD/multiple interventions. She tolerated the procedure well without immediate intervention.   She reported bilateral leg pain the following day. She was found examined and labwork reviewed, and found to be stable for discharge today. She will follow-up with Dr. Excell Seltzer later this month with ABIs. [facility] office will call with this appointment. She will be discharged with ASA/Plavix and continue all other home meds.   Discharge Vitals:  Blood pressure 113/51, pulse 87, temperature 98.7 F (37.1 C), temperature source Oral, resp. rate 18, height 5\' 4"  (1.626 m), weight 71.1 kg (156 lb 12 oz), SpO2 91.00%.   Labs: Recent Labs  Basename 08/06/11 1610   WBC 6.4   HGB 13.1   HCT 39.5   MCV 99.5   PLT 166    Lab 08/06/11 1939  NA 140  K 3.6  CL 100  CO2 33*  BUN 14  CREATININE 0.80  CALCIUM 9.8  PROT 5.8*  BILITOT 0.4  ALKPHOS 81  ALT 14  AST 24  AMYLASE --  LIPASE --  GLUCOSE 84   Disposition:  Discharge  Orders    Future Appointments: Provider: Department: Dept Phone: Center:   08/20/2011 10:30 AM Melissa C. Al-Rammal [facility]  None   08/20/2011 11:00 AM Micheline Chapman, MD [facility] [id number]     Follow-up Information    Follow up with [facility]. (Will call you with appointment dates and  times. )    Contact information:   [address] [place] [address]         Discharge Medications:  Medication List  As of 08/09/2011  1:04 PM   START taking these medications         clopidogrel 75 MG tablet   Commonly known as: PLAVIX   Take 1 tablet (75 mg total) by mouth daily with breakfast.         CONTINUE taking these medications         ALPRAZolam 1 MG tablet   Commonly known as: XANAX      aspirin 81 MG EC tablet      cholecalciferol 1000 UNITS tablet   Commonly known as: VITAMIN D      cycloSPORINE 0.05 % ophthalmic emulsion   Commonly known as: RESTASIS      diazepam 5 MG tablet   Commonly known as: VALIUM      doxycycline 100 MG EC tablet   Commonly known as: DORYX      esomeprazole 40 MG capsule   Commonly known as: NEXIUM      Fish Oil 1000 MG Caps      fluorometholone 0.1 % ophthalmic suspension   Commonly known as: FML      metoprolol 50 MG tablet   Commonly known as: LOPRESSOR      multivitamin tablet      nitroGLYCERIN 0.4 MG SL tablet   Commonly known as: NITROSTAT      OVER THE COUNTER MEDICATION      * oxyCODONE 40 MG 12 hr tablet   Commonly known as: OXYCONTIN      * ROXICODONE 30 MG immediate release tablet   Generic drug: oxycodone      rosuvastatin 40 MG tablet   Commonly known as: CRESTOR      sulfamethoxazole-trimethoprim 400-80 MG per tablet   Commonly known as: BACTRIM,SEPTRA      VENTOLIN HFA 108 (90 BASE) MCG/ACT inhaler   Generic drug: albuterol      vitamin A 13086 UNIT capsule      vitamin B-12 500 MCG tablet   Commonly known as: CYANOCOBALAMIN      WELLBUTRIN SR 150 MG 12 hr tablet   Generic drug: buPROPion     * Notice: This list has 2 medication(s) that are the same as other medications prescribed for you. Read the directions carefully, and ask your doctor or other care provider to review them with you.        Where to get your medications    These are the prescriptions  that you need to pick up. We sent them to a specific pharmacy, so you will need to go there to get them.   [facility] - [place], [place] - [address]    [address] [place] [address]    Phone: [phone]        clopidogrel 75 MG tablet           Outstanding Labs/Studies: None  Duration of Discharge Encounter: 40 minutes including physician time.  Signed, R. Hurman Horn, PA-C 08/09/2011, 1:04 PM

## 2011-08-09 NOTE — Progress Notes (Signed)
Subjective:  Complains of bilateral leg pain. No chest pain or dyspnea.  Objective:  Vital Signs in the last 24 hours: Temp:  [97 F (36.1 C)-99.2 F (37.3 C)] 99.2 F (37.3 C) (02/07 0602) Pulse Rate:  [77-101] 87  (02/07 0602) Resp:  [10-24] 19  (02/07 0602) BP: (86-130)/(40-88) 114/52 mmHg (02/07 0602) SpO2:  [91 %-96 %] 91 % (02/07 0011) Weight:  [71.215 kg (157 lb)] 71.215 kg (157 lb) (02/06 1005)  Intake/Output from previous day: 02/06 0701 - 02/07 0700 In: 1520 [P.O.:220; I.V.:1300] Out: 550 [Urine:550]  Physical Exam: Pt is alert and oriented, NAD HEENT: normal Abd: soft, NT, Positive BS Ext: no C/C/E, right foot with hyperemia/warmth. Easily dopplerable pulses both feet DP and PT Skin: skin tear right anterior tibial region  Lab Results:  Basename 08/06/11 1610  WBC 6.4  HGB 13.1  PLT 166    Basename 08/06/11 1939  NA 140  K 3.6  CL 100  CO2 33*  GLUCOSE 84  BUN 14  CREATININE 0.80   No results found for this basename: TROPONINI:2,CK,MB:2 in the last 72 hours  Assessment/Plan:  Severe PAD with rest pain. Status post bilateral iliac stenting Feb 6th with covered stents. Needs to stay on ASA 81 mg and plavix 75 mg daily for at least 30 days, but would favor long-term in setting of severe PAD/multiple interventions.  Other medical problems stable. Continue home meds. Needs follow-up 2 wks with ABI's and office visit same day.  [name], M.D. 08/09/2011, 6:53 AM

## 2011-08-09 NOTE — Consult Note (Signed)
Pt smokes 1 ppd and has quit once for 9 months. She states that she is a 10/10 for willingness to quit when asked. Pt is heavily addicted to nicotine. [name] in action stage. Recommended 21 mg patch x 6 weeks, 14 mg patch x 2 weeks and the 7 mg patch x 2 weeks. Discussed and wrote down patch use instructions for the pt. Referred to 1-800 quit now for f/u and support. Discussed oral fixation substitutes, second hand smoke and in home smoking policy. Reviewed and gave pt Written education/contact information.

## 2011-08-09 NOTE — Progress Notes (Signed)
Bedside commode delivered to patient in room to take home with her.

## 2011-08-09 NOTE — Plan of Care (Signed)
Problem: Phase II Progression Outcomes Goal: Ambulates up to 600 ft. in hall x 1 Outcome: Not Met (add Reason) Pt amb to bathroom with assist of RN and iv pole.  Has great difficulty bearing weight on right leg due to severe pain.  Medicated for pain.  Pt states has cane and walker at home.

## 2011-08-10 NOTE — Discharge Summary (Signed)
Please see my progress note the same day. Agree with documentation as outlined.

## 2011-08-10 NOTE — Progress Notes (Signed)
   CARE MANAGEMENT NOTE 08/10/2011  Patient:  [name], [name]   Account Number:  [id number]  Date Initiated:  08/10/2011  Documentation initiated by:  GRAVES-BIGELOW,Molina Hollenback  Subjective/Objective Assessment:   Pt admitted with Atherosclerosis of native arteries of the extremities with rest pain. S/p Abdominal aortogram, bilateral iliac stenting.     Action/Plan:   Plan for home on 08-09-11 and needs BSC. CM  Darl Pikes B placed order for dme  via [facility] and dme delivered to room.   Anticipated DC Date:  08/09/2011   Anticipated DC Plan:  HOME/SELF CARE      DC Planning Services  CM consult      PAC Choice  DURABLE MEDICAL EQUIPMENT   Choice offered to / List presented to:  C-1 Patient   DME arranged  3-N-1      DME agency  [facility]        Status of service:  Completed, signed off Medicare Important Message given?   (If response is "NO", the following Medicare IM given date fields will be blank) Date Medicare IM given:   Date Additional Medicare IM given:    Discharge Disposition:  HOME/SELF CARE  Per UR Regulation:    Comments:

## 2011-08-17 ENCOUNTER — Telehealth: Payer: Self-pay | Admitting: Cardiovascular Disease

## 2011-08-17 NOTE — Telephone Encounter (Deleted)
New Problem:     Patient called in wanting to let someone know that her right foot has swelled

## 2011-08-20 ENCOUNTER — Ambulatory Visit: Payer: Medicare Other | Admitting: Cardiovascular Disease

## 2011-08-20 ENCOUNTER — Encounter: Payer: Medicare Other | Admitting: Cardiology

## 2011-08-20 ENCOUNTER — Encounter: Payer: Self-pay | Admitting: Physician Assistant

## 2011-08-20 ENCOUNTER — Ambulatory Visit (INDEPENDENT_AMBULATORY_CARE_PROVIDER_SITE_OTHER): Payer: Medicare Other | Admitting: Physician Assistant

## 2011-08-20 VITALS — BP 102/78 | HR 78 | Ht 64.0 in | Wt 148.0 lb

## 2011-08-20 DIAGNOSIS — I70229 Atherosclerosis of native arteries of extremities with rest pain, unspecified extremity: Secondary | ICD-10-CM

## 2011-08-20 DIAGNOSIS — J449 Chronic obstructive pulmonary disease, unspecified: Secondary | ICD-10-CM

## 2011-08-20 DIAGNOSIS — I10 Essential (primary) hypertension: Secondary | ICD-10-CM

## 2011-08-20 DIAGNOSIS — I739 Peripheral vascular disease, unspecified: Secondary | ICD-10-CM

## 2011-08-20 NOTE — Progress Notes (Signed)
HPI:  This is a 56 year old white female patient who recently underwent bilateral iliac stenting on 08/08/11 because of severe vascular disease and nonhealing ulcer. [name] here for post hospital followup. [name] doing better and has decreased leg pain although she still has her ulcer that is starting to heal. She would like referred to the wound clinic in [place]. She has not been able to walk much because of the ulcer. She says she stopped smoking but she does smell of cigarette smoke.  No Known Allergies  Current Outpatient Prescriptions on File Prior to Visit  Medication Sig Dispense Refill  . albuterol (VENTOLIN HFA) 108 (90 BASE) MCG/ACT inhaler Inhale 2 puffs into the lungs 3 (three) times daily.        [name] ALPRAZolam (XANAX) 1 MG tablet Take 1 mg by mouth 3 (three) times daily.        [name] aspirin 81 MG EC tablet Take 81 mg by mouth daily.        [name] buPROPion (WELLBUTRIN SR) 150 MG 12 hr tablet Take 150 mg by mouth daily.        . cholecalciferol (VITAMIN D) 1000 UNITS tablet Take 2,000 Units by mouth daily.       . clopidogrel (PLAVIX) 75 MG tablet Take 1 tablet (75 mg total) by mouth daily with breakfast.  30 tablet  3  . cycloSPORINE (RESTASIS) 0.05 % ophthalmic emulsion Place 1 drop into both eyes 2 (two) times daily.      . diazepam (VALIUM) 5 MG tablet Take 5 mg by mouth 3 (three) times daily.        [name] doxycycline (DORYX) 100 MG EC tablet Take 100 mg by mouth 2 (two) times daily. Take for 10 days. Rx. Filled on 08/01/11      . esomeprazole (NEXIUM) 40 MG capsule Take 40 mg by mouth 2 (two) times daily.       . fluorometholone (FML) 0.1 % ophthalmic suspension Place 1 drop into both eyes 2 (two) times daily.        . metoprolol (LOPRESSOR) 50 MG tablet Take 25 mg by mouth 2 (two) times daily.      . Multiple Vitamin (MULTIVITAMIN) tablet Take 1 tablet by mouth daily.        . nitroGLYCERIN (NITROSTAT) 0.4 MG SL tablet Place 0.4 mg under the tongue every 5 (five) minutes as needed. For  chest pain      . Omega-3 Fatty Acids (FISH OIL) 1000 MG CAPS Take 1 capsule by mouth daily.        [name] OVER THE COUNTER MEDICATION Take 45 mcg by mouth daily. Vitamin K2      . oxyCODONE (OXYCONTIN) 40 MG 12 hr tablet Take 80 mg by mouth every 12 (twelve) hours as needed. For pain      . oxycodone (ROXICODONE) 30 MG immediate release tablet Take 30 mg by mouth every 4 (four) hours as needed. For pain      . rosuvastatin (CRESTOR) 40 MG tablet Take 40 mg by mouth daily.        [name] sulfamethoxazole-trimethoprim (BACTRIM,SEPTRA) 400-80 MG per tablet Take 2 tablets by mouth. Every Saturday and Sunday       . vitamin A 04540 UNIT capsule Take 10,000 Units by mouth daily.        . vitamin B-12 (CYANOCOBALAMIN) 500 MCG tablet Take 500 mcg by mouth daily.          Past Medical History  Diagnosis Date  .  Coronary artery disease     status post stenting of the aortic and iliac vessels by Dr. Allyson Sabal. Repeat aortic stenting 2010  . Left ventricular dysfunction     hx of with ejection fraction of 35% range.  . Carotid artery disease     hx of carotid cerebrovascular disease with 0-39% bilateral internal carotid artery setenosis   . Non Hodgkin's lymphoma   . Chronic back pain     status post lumbar surgery  . Hyperlipidemia   . Hypertension   . COPD (chronic obstructive pulmonary disease)   . Shortness of breath   . Cancer     hx of non hogdin lymphoma  . Stroke   . CHF (congestive heart failure)   . Blood transfusion   . Arthritis   . Myocardial infarction   . Peripheral vascular disease     Past Surgical History  Procedure Date  . Lumar surg   . Back surgery     x 2  . Lung biopsy   . Abdominal hysterectomy   . Cardiac catheterization   . Abdominal aortogram 08/08/2011    Family History  Problem Relation Age of Onset  . Coronary artery disease      positive for vascular and cardiac disease    History   Social History  . Marital Status: Single    Spouse Name: N/A    Number of  Children: 2  . Years of Education: N/A   Occupational History  . retired    Social History Main Topics  . Smoking status: Former Smoker -- 1.0 packs/day for 20 years    Types: Cigarettes  . Smokeless tobacco: Never Used   Comment: smokes 1 pack a day  . Alcohol Use: No  . Drug Use: No  . Sexually Active: No   Other Topics Concern  . Not on file   Social History Narrative  . No narrative on file    ROS:see history of present illness   PHYSICAL EXAM: Well-nournished, in no acute distress,smells of cigarette smoke. Neck: No JVD, HJR, Bruit, or thyroid enlargement Lungs: No tachypnea, clear without wheezing, rales, or rhonchi Cardiovascular: RRR, PMI not displaced, heart sounds normal, no murmurs, gallops, bruit, thrill, or heave. Abdomen: BS normal. Soft without organomegaly, masses, lesions or tenderness. Extremities: right and left groins without hematoma or hemorrhage, excellent distal pulses bilaterally, ulcer on her anterior tibia region that has a dressing over it. Trace of right ankle edema, without cyanosis, clubbing . Good distal pulses bilateral SKin: Warm, no lesions or rashes  Musculoskeletal: No deformities Neuro: no focal signs  BP 102/78  Pulse 78  Ht 5\' 4"  (1.626 m)  Wt 148 lb (67.132 kg)  BMI 25.40 kg/m2   EAV:[id number] sinus rhythm

## 2011-08-20 NOTE — Assessment & Plan Note (Signed)
Patient claims she quit smoking but does smell of cigarette smoke. Importance of smoking cessation discussed.

## 2011-08-20 NOTE — Assessment & Plan Note (Signed)
stable °

## 2011-08-20 NOTE — Patient Instructions (Signed)
Your physician recommends that you schedule a follow-up appointment as scheduled with Dr Theodoro Parma have been referred to a wound clinic at [facility]

## 2011-08-20 NOTE — Assessment & Plan Note (Addendum)
Patient underwent bilateral iliac stenting on 08/08/11. [name] doing well. She still has a nonhealing ulcer on her right anterior tibia and we will refer her to a local wound clinic.She has followup with Dr. Excell Seltzer in March with ABIs

## 2011-08-21 ENCOUNTER — Telehealth: Payer: Self-pay | Admitting: *Deleted

## 2011-08-21 NOTE — Telephone Encounter (Signed)
Patient was referred to [facility] by [name] , for lower extremity ulcer. [name] has decided not to keep the appointment on 08/27/11 @ 10 am. Patient states that her ulcer looks to be healing nicely and she will call back if anything changes.

## 2011-08-22 NOTE — Telephone Encounter (Signed)
Pt is willing to keep the appointment.  We need to see if it is still available or another one soon and call her.

## 2011-08-22 NOTE — Telephone Encounter (Signed)
Please let patient know she needs to be followed at the wound clinic.

## 2011-08-23 NOTE — Telephone Encounter (Signed)
Fu call °Patient returning your call °

## 2011-08-23 NOTE — Telephone Encounter (Signed)
Spoke with pt, questions regarding appointment answered. 

## 2011-08-23 NOTE — Telephone Encounter (Signed)
Patient has been rescheduled. Appointment date 08/27/11 @ 10 am. Patient daughter has been notified.

## 2011-08-27 ENCOUNTER — Encounter (HOSPITAL_BASED_OUTPATIENT_CLINIC_OR_DEPARTMENT_OTHER): Payer: Medicare Other

## 2011-09-12 ENCOUNTER — Encounter: Payer: Self-pay | Admitting: Cardiovascular Disease

## 2011-09-24 ENCOUNTER — Encounter (HOSPITAL_BASED_OUTPATIENT_CLINIC_OR_DEPARTMENT_OTHER): Payer: Medicare Other

## 2011-09-27 ENCOUNTER — Encounter (INDEPENDENT_AMBULATORY_CARE_PROVIDER_SITE_OTHER): Payer: Medicare Other

## 2011-09-27 ENCOUNTER — Ambulatory Visit (INDEPENDENT_AMBULATORY_CARE_PROVIDER_SITE_OTHER): Payer: Medicare Other | Admitting: Cardiovascular Disease

## 2011-09-27 ENCOUNTER — Encounter: Payer: Self-pay | Admitting: Cardiovascular Disease

## 2011-09-27 VITALS — BP 95/66 | HR 62 | Ht 64.0 in | Wt 146.8 lb

## 2011-09-27 DIAGNOSIS — I70229 Atherosclerosis of native arteries of extremities with rest pain, unspecified extremity: Secondary | ICD-10-CM

## 2011-09-27 DIAGNOSIS — I739 Peripheral vascular disease, unspecified: Secondary | ICD-10-CM

## 2011-09-27 DIAGNOSIS — I70219 Atherosclerosis of native arteries of extremities with intermittent claudication, unspecified extremity: Secondary | ICD-10-CM

## 2011-09-27 NOTE — Patient Instructions (Addendum)
Your physician wants you to follow-up in:6 months.  You will receive a reminder letter in the mail two months in advance. If you don't receive a letter, please call our office to schedule the follow-up appointment.  Your physician has requested that you have an abdominal aorta duplex. During this test, an ultrasound is used to evaluate the aorta. Allow 30 minutes for this exam. Do not eat after midnight the day before and avoid carbonated beverages  Your physician has requested that you have an ankle brachial index (ABI). During this test an ultrasound and blood pressure cuff are used to evaluate the arteries that supply the arms and legs with blood. Allow thirty minutes for this exam. There are no restrictions or special instructions.

## 2011-10-06 ENCOUNTER — Encounter: Payer: Self-pay | Admitting: Cardiovascular Disease

## 2011-10-06 NOTE — Progress Notes (Signed)
HPI: 56 year old woman with multiple tobacco related medical problems presenting for followup evaluation. She has severe peripheral arterial disease and recently underwent bilateral iliac stenting with covered stents in February 2013. This was done because of resting ischemia in the right leg. Following the procedure she developed swelling in the right leg, but overall her legs feel much better and she is now walking better than she has in several years. She continues to have chronic pain, and no symptoms of typical claudication. She denies chest pain. She denies palpitations or syncope. She continues to smoke but less than in the past. Recent ABIs were 0.95 on the right and 0.85 on the left with good digital pressures bilaterally.  Outpatient Encounter Prescriptions as of 09/27/2011  Medication Sig Dispense Refill  . albuterol (VENTOLIN HFA) 108 (90 BASE) MCG/ACT inhaler Inhale 2 puffs into the lungs 3 (three) times daily.        [name] ALPRAZolam (XANAX) 1 MG tablet Take 1 mg by mouth 3 (three) times daily.        [name] AMITRIPTYLINE HCL PO Take by mouth daily.      [name] aspirin 81 MG EC tablet Take 81 mg by mouth daily.        [name] buPROPion (WELLBUTRIN SR) 150 MG 12 hr tablet Take 150 mg by mouth daily.        . cholecalciferol (VITAMIN D) 1000 UNITS tablet Take 2,000 Units by mouth daily.       . clopidogrel (PLAVIX) 75 MG tablet Take 1 tablet (75 mg total) by mouth daily with breakfast.  30 tablet  3  . cycloSPORINE (RESTASIS) 0.05 % ophthalmic emulsion Place 1 drop into both eyes 2 (two) times daily.      . diazepam (VALIUM) 5 MG tablet Take 5 mg by mouth 3 (three) times daily.        [name] doxycycline (DORYX) 100 MG EC tablet Take 100 mg by mouth 2 (two) times daily. Take for 10 days. Rx. Filled on 08/01/11      . esomeprazole (NEXIUM) 40 MG capsule Take 40 mg by mouth 2 (two) times daily.       . fluorometholone (FML) 0.1 % ophthalmic suspension Place 1 drop into both eyes 2 (two) times daily.        .  metoprolol (LOPRESSOR) 50 MG tablet Take 25 mg by mouth 2 (two) times daily.      . Multiple Vitamin (MULTIVITAMIN) tablet Take 1 tablet by mouth daily.        . nitroGLYCERIN (NITROSTAT) 0.4 MG SL tablet Place 0.4 mg under the tongue every 5 (five) minutes as needed. For chest pain      . Omega-3 Fatty Acids (FISH OIL) 1000 MG CAPS Take 1 capsule by mouth daily.        [name] OVER THE COUNTER MEDICATION Take 45 mcg by mouth daily. Vitamin K2      . oxyCODONE (OXYCONTIN) 40 MG 12 hr tablet Take 80 mg by mouth every 12 (twelve) hours as needed. For pain      . oxycodone (ROXICODONE) 30 MG immediate release tablet Take 30 mg by mouth every 4 (four) hours as needed. For pain      . rosuvastatin (CRESTOR) 40 MG tablet Take 40 mg by mouth daily.        [name] sulfamethoxazole-trimethoprim (BACTRIM,SEPTRA) 400-80 MG per tablet Take 2 tablets by mouth. Every Saturday and Sunday       . vitamin A 16109 UNIT capsule  Take 10,000 Units by mouth daily.        . vitamin B-12 (CYANOCOBALAMIN) 500 MCG tablet Take 500 mcg by mouth daily.          No Known Allergies  Past Medical History  Diagnosis Date  . Coronary artery disease     status post stenting of the aortic and iliac vessels by Dr. Allyson Sabal. Repeat aortic stenting 2010  . Left ventricular dysfunction     hx of with ejection fraction of 35% range.  . Carotid artery disease     hx of carotid cerebrovascular disease with 0-39% bilateral internal carotid artery setenosis   . Non Hodgkin's lymphoma   . Chronic back pain     status post lumbar surgery  . Hyperlipidemia   . Hypertension   . COPD (chronic obstructive pulmonary disease)   . Shortness of breath   . Cancer     hx of non hogdin lymphoma  . Stroke   . CHF (congestive heart failure)   . Blood transfusion   . Arthritis   . Myocardial infarction   . Peripheral vascular disease     ROS: Negative except as per HPI  BP 95/66  Pulse 62  Ht 5\' 4"  (1.626 m)  Wt 66.588 kg (146 lb 12.8 oz)  BMI  25.20 kg/m2  PHYSICAL EXAM: Pt is alert and oriented, chronically ill-appearing woman in NAD HEENT: normal Neck: JVP - normal, carotids 2+= with bilateral bruits Lungs: CTA bilaterally CV: RRR with soft systolic ejection murmur Abd: soft, NT, Positive BS, no hepatomegaly Ext: 1+ edema at the right ankle, pedal pulses are not palpable, skin changes are much improved with resolution of dependent rubor of the extremities. Skin: warm/dry no rash  ASSESSMENT AND PLAN:

## 2011-10-06 NOTE — Assessment & Plan Note (Addendum)
She has had a good response to iliac stenting utilizing the covered stents. I explained to her that her right leg swelling was related to reperfusion of a chronically ischemic foot. This is slowly improving and I suspect will resolve over the next several weeks. I would favor that she continue on long-term dual antiplatelet therapy considering the severity of her arterial disease and probable ongoing hypercoagulability. I'll see her back in 6 months with an aortoiliac ultrasound and followup ABIs.

## 2011-11-20 ENCOUNTER — Encounter: Payer: Self-pay | Admitting: Cardiovascular Disease

## 2011-12-18 ENCOUNTER — Encounter (INDEPENDENT_AMBULATORY_CARE_PROVIDER_SITE_OTHER): Payer: Medicare Other | Admitting: Hematology and Oncology

## 2011-12-18 DIAGNOSIS — J8482 Adult pulmonary Langerhans cell histiocytosis: Secondary | ICD-10-CM

## 2011-12-18 DIAGNOSIS — I2510 Atherosclerotic heart disease of native coronary artery without angina pectoris: Secondary | ICD-10-CM

## 2011-12-18 DIAGNOSIS — C8589 Other specified types of non-Hodgkin lymphoma, extranodal and solid organ sites: Secondary | ICD-10-CM

## 2012-01-01 ENCOUNTER — Other Ambulatory Visit: Payer: Self-pay | Admitting: Cardiology

## 2012-01-01 NOTE — Telephone Encounter (Signed)
Fax Received. Refill Completed. Laria Grimmett Chowoe (R.M.A)   

## 2012-01-08 ENCOUNTER — Other Ambulatory Visit (HOSPITAL_COMMUNITY): Payer: Self-pay | Admitting: Physician Assistant

## 2012-01-14 ENCOUNTER — Telehealth: Payer: Self-pay | Admitting: Cardiovascular Disease

## 2012-01-14 MED ORDER — CLOPIDOGREL BISULFATE 75 MG PO TABS: 75.0000 mg | ORAL_TABLET | Freq: Every day | ORAL | Status: DC

## 2012-01-14 NOTE — Telephone Encounter (Signed)
Pt wants to know should she still take plavix

## 2012-01-14 NOTE — Telephone Encounter (Signed)
Patient stated refill not received at pharmacy, called in for patient.    When refilling Plavix warning came up about taking with Nexium.  Will forward to Dr Excell Seltzer to make sure he wants her to continue both

## 2012-01-17 NOTE — Telephone Encounter (Signed)
Would be best to change to protonix 40 mg daily. If this has not been effective for the patient, I will look into other PPI's.

## 2012-01-17 NOTE — Telephone Encounter (Signed)
I spoke with Tripp at [facility] and the pt picked up a supply of Nexium on 12/29/11.  I made Tripp aware that we will discontinue Nexium and start Pantoprazole 40mg  daily #30, RF x 6.  I made the pt aware of this information and she verbalized understanding.

## 2012-01-29 ENCOUNTER — Encounter: Payer: Medicare Other | Admitting: Hematology and Oncology

## 2012-01-29 DIAGNOSIS — Z452 Encounter for adjustment and management of vascular access device: Secondary | ICD-10-CM

## 2012-01-29 DIAGNOSIS — C8589 Other specified types of non-Hodgkin lymphoma, extranodal and solid organ sites: Secondary | ICD-10-CM

## 2012-03-07 ENCOUNTER — Other Ambulatory Visit: Payer: Self-pay | Admitting: Cardiology

## 2012-03-07 DIAGNOSIS — I739 Peripheral vascular disease, unspecified: Secondary | ICD-10-CM

## 2012-03-11 ENCOUNTER — Encounter (INDEPENDENT_AMBULATORY_CARE_PROVIDER_SITE_OTHER): Payer: Medicare Other | Admitting: Hematology and Oncology

## 2012-03-11 DIAGNOSIS — I2510 Atherosclerotic heart disease of native coronary artery without angina pectoris: Secondary | ICD-10-CM

## 2012-03-11 DIAGNOSIS — C8589 Other specified types of non-Hodgkin lymphoma, extranodal and solid organ sites: Secondary | ICD-10-CM

## 2012-03-11 DIAGNOSIS — D696 Thrombocytopenia, unspecified: Secondary | ICD-10-CM

## 2012-04-04 ENCOUNTER — Encounter (HOSPITAL_COMMUNITY): Payer: Self-pay

## 2012-04-07 ENCOUNTER — Encounter (HOSPITAL_COMMUNITY)
Admission: RE | Admit: 2012-04-07 | Discharge: 2012-04-07 | Disposition: A | Discharge status: Home / Self Care | Payer: Medicare Other | Source: Ambulatory Visit | Attending: Obstetrics & Gynecology | Admitting: Obstetrics & Gynecology

## 2012-04-07 ENCOUNTER — Encounter (HOSPITAL_COMMUNITY): Payer: Self-pay

## 2012-04-07 HISTORY — DX: Anemia, unspecified: D64.9

## 2012-04-07 HISTORY — DX: Gastro-esophageal reflux disease without esophagitis: K21.9

## 2012-04-07 LAB — CBC WITH DIFFERENTIAL/PLATELET
Basophils Absolute: 0 10*3/uL (ref 0.0–0.1)
Basophils Relative: 0 % (ref 0–1)
Eosinophils Relative: 1 % (ref 0–5)
HCT: 36.9 % (ref 36.0–46.0)
Lymphocytes Relative: 28 % (ref 12–46)
MCHC: 34.7 g/dL (ref 30.0–36.0)
Monocytes Absolute: 0.5 10*3/uL (ref 0.1–1.0)
Neutro Abs: 4 10*3/uL (ref 1.7–7.7)
Platelets: 116 10*3/uL — ABNORMAL LOW (ref 150–400)
RDW: 14.9 % (ref 11.5–15.5)
WBC: 6.3 10*3/uL (ref 4.0–10.5)

## 2012-04-07 LAB — HCG, SERUM, QUALITATIVE: Preg, Serum: NEGATIVE

## 2012-04-07 LAB — COMPREHENSIVE METABOLIC PANEL
ALT: 15 U/L (ref 0–35)
AST: 17 U/L (ref 0–37)
Albumin: 3.5 g/dL (ref 3.5–5.2)
CO2: 29 mEq/L (ref 19–32)
Calcium: 9.2 mg/dL (ref 8.4–10.5)
Chloride: 99 mEq/L (ref 96–112)
Creatinine, Ser: 1.16 mg/dL — ABNORMAL HIGH (ref 0.50–1.10)
Sodium: 136 mEq/L (ref 135–145)

## 2012-04-07 LAB — URINALYSIS, ROUTINE W REFLEX MICROSCOPIC
Bilirubin Urine: NEGATIVE
Glucose, UA: NEGATIVE mg/dL
Hgb urine dipstick: NEGATIVE
Specific Gravity, Urine: 1.015 (ref 1.005–1.030)

## 2012-04-07 LAB — SURGICAL PCR SCREEN: MRSA, PCR: NEGATIVE

## 2012-04-07 MED ORDER — DEXTROSE 5 % IV SOLN: 3.0000 g | INTRAVENOUS | Status: DC

## 2012-04-07 NOTE — Patient Instructions (Addendum)
22 [name]  04/07/2012   Your procedure is scheduled on:  04/09/12  Report to [facility] at 09:10 AM.  Call this number if you have problems the morning of surgery: [phone]   Remember:   Do not eat or drink:After Midnight.  Take these medicines the morning of surgery with A SIP OF WATER: Nexium, Metoprolol, Xanax, Buproprion. Diazepam and Oxycodone if needed.   Do not wear jewelry, make-up or nail polish.  Do not wear lotions, powders, or perfumes. You may wear deodorant.  Do not shave 48 hours prior to surge Men may shave face and neck.  Do not bring valuables to the hospital.ry.  Contacts, dentures or bridgework may not be worn into surgery.  Leave suitcase in the car. After surgery it may be brought to your room.  For patients admitted to the hospital, checkout time is 11:00 AM the day of discharge.   Patients discharged the day of surgery will not be allowed to drive home.  Special Instructions: Shower using CHG 2 nights before surgery and the night before surgery.  If you shower the day of surgery use CHG.  Use special wash - you have one bottle of CHG for all showers.  You should use approximately 1/3 of the bottle for each shower.   Please read over the following fact sheets that you were given: Pain Booklet and Anesthesia Post-op Instructions    PATIENT INSTRUCTIONS POST-ANESTHESIA  IMMEDIATELY FOLLOWING SURGERY:  Do not drive or operate machinery for the first twenty four hours after surgery.  Do not make any important decisions for twenty four hours after surgery or while taking narcotic pain medications or sedatives.  If you develop intractable nausea and vomiting or a severe headache please notify your doctor immediately.  FOLLOW-UP:  Please make an appointment with your surgeon as instructed. You do not need to follow up with anesthesia unless specifically instructed to do so.  WOUND CARE INSTRUCTIONS (if applicable):  Keep a dry clean dressing on the  anesthesia/puncture wound site if there is drainage.  Once the wound has quit draining you may leave it open to air.  Generally you should leave the bandage intact for twenty four hours unless there is drainage.  If the epidural site drains for more than 36-48 hours please call the anesthesia department.  QUESTIONS?:  Please feel free to call your physician or the hospital operator if you have any questions, and they will be happy to assist you.

## 2012-04-09 ENCOUNTER — Encounter (HOSPITAL_COMMUNITY): Payer: Self-pay

## 2012-04-09 ENCOUNTER — Ambulatory Visit (HOSPITAL_COMMUNITY): Payer: Medicare Other | Admitting: Anesthesiology

## 2012-04-09 ENCOUNTER — Ambulatory Visit (HOSPITAL_COMMUNITY)
Admission: RE | Admit: 2012-04-09 | Discharge: 2012-04-09 | Disposition: A | Discharge status: Home / Self Care | Payer: Medicare Other | Source: Ambulatory Visit | Attending: Obstetrics & Gynecology | Admitting: Obstetrics & Gynecology

## 2012-04-09 ENCOUNTER — Encounter (HOSPITAL_COMMUNITY): Payer: Self-pay | Admitting: Anesthesiology

## 2012-04-09 ENCOUNTER — Encounter (HOSPITAL_COMMUNITY)
Admission: RE | Disposition: A | Discharge status: Home / Self Care | Payer: Self-pay | Source: Ambulatory Visit | Attending: Obstetrics & Gynecology

## 2012-04-09 DIAGNOSIS — Z01810 Encounter for preprocedural cardiovascular examination: Secondary | ICD-10-CM | POA: Insufficient documentation

## 2012-04-09 DIAGNOSIS — J449 Chronic obstructive pulmonary disease, unspecified: Secondary | ICD-10-CM | POA: Insufficient documentation

## 2012-04-09 DIAGNOSIS — J4489 Other specified chronic obstructive pulmonary disease: Secondary | ICD-10-CM | POA: Insufficient documentation

## 2012-04-09 DIAGNOSIS — N909 Noninflammatory disorder of vulva and perineum, unspecified: Secondary | ICD-10-CM | POA: Insufficient documentation

## 2012-04-09 DIAGNOSIS — D071 Carcinoma in situ of vulva: Secondary | ICD-10-CM | POA: Insufficient documentation

## 2012-04-09 DIAGNOSIS — I10 Essential (primary) hypertension: Secondary | ICD-10-CM | POA: Insufficient documentation

## 2012-04-09 HISTORY — PX: VULVA /PERINEUM BIOPSY: SHX319

## 2012-04-09 SURGERY — VULVECTOMY, PARTIAL
Anesthesia: General | Site: Vulva | Wound class: Clean Contaminated

## 2012-04-09 MED ORDER — NEOSTIGMINE METHYLSULFATE 1 MG/ML IJ SOLN
INTRAMUSCULAR | Status: DC | PRN
  Administered 2012-04-09: 4 mg via INTRAVENOUS

## 2012-04-09 MED ORDER — GLYCOPYRROLATE 0.2 MG/ML IJ SOLN
INTRAMUSCULAR | Status: AC
  Filled 2012-04-09: qty 3

## 2012-04-09 MED ORDER — ONDANSETRON HCL 8 MG PO TABS: 8.0000 mg | ORAL_TABLET | Freq: Three times a day (TID) | ORAL | Status: DC | PRN

## 2012-04-09 MED ORDER — ROCURONIUM BROMIDE 100 MG/10ML IV SOLN
INTRAVENOUS | Status: DC | PRN
Start: 2012-04-09 — End: 2012-04-09
  Administered 2012-04-09: 25 mg via INTRAVENOUS

## 2012-04-09 MED ORDER — PROPOFOL 10 MG/ML IV EMUL
INTRAVENOUS | Status: AC
  Filled 2012-04-09: qty 20

## 2012-04-09 MED ORDER — ONDANSETRON HCL 4 MG/2ML IJ SOLN: 4.0000 mg | Freq: Once | INTRAMUSCULAR | Status: DC | PRN

## 2012-04-09 MED ORDER — KETOROLAC TROMETHAMINE 10 MG PO TABS
10.0000 mg | ORAL_TABLET | Freq: Three times a day (TID) | ORAL | Status: DC | PRN
Start: 2012-04-09 — End: 2012-05-06

## 2012-04-09 MED ORDER — KETOROLAC TROMETHAMINE 30 MG/ML IJ SOLN
INTRAMUSCULAR | Status: AC
  Filled 2012-04-09: qty 1

## 2012-04-09 MED ORDER — CEFAZOLIN SODIUM-DEXTROSE 2-3 GM-% IV SOLR: 2.0000 g | Freq: Once | INTRAVENOUS | Status: DC

## 2012-04-09 MED ORDER — ONDANSETRON HCL 4 MG/2ML IJ SOLN
4.0000 mg | Freq: Once | INTRAMUSCULAR | Status: AC
  Administered 2012-04-09: 4 mg via INTRAVENOUS

## 2012-04-09 MED ORDER — LACTATED RINGERS IV SOLN
INTRAVENOUS | Status: DC | PRN
  Administered 2012-04-09 (×2): via INTRAVENOUS

## 2012-04-09 MED ORDER — ALBUTEROL SULFATE (5 MG/ML) 0.5% IN NEBU
2.5000 mg | INHALATION_SOLUTION | Freq: Once | RESPIRATORY_TRACT | Status: AC
  Administered 2012-04-09: 2.5 mg via RESPIRATORY_TRACT

## 2012-04-09 MED ORDER — DEXAMETHASONE SODIUM PHOSPHATE 4 MG/ML IJ SOLN
INTRAMUSCULAR | Status: AC
  Filled 2012-04-09: qty 1

## 2012-04-09 MED ORDER — ALBUTEROL SULFATE (5 MG/ML) 0.5% IN NEBU
INHALATION_SOLUTION | RESPIRATORY_TRACT | Status: AC
  Filled 2012-04-09: qty 0.5

## 2012-04-09 MED ORDER — FENTANYL CITRATE 0.05 MG/ML IJ SOLN
INTRAMUSCULAR | Status: DC | PRN
  Administered 2012-04-09 (×2): 50 ug via INTRAVENOUS

## 2012-04-09 MED ORDER — SODIUM CHLORIDE 0.9 % IN NEBU
INHALATION_SOLUTION | RESPIRATORY_TRACT | Status: AC
  Filled 2012-04-09: qty 3

## 2012-04-09 MED ORDER — LIDOCAINE HCL (PF) 1 % IJ SOLN
INTRAMUSCULAR | Status: AC
  Filled 2012-04-09: qty 5

## 2012-04-09 MED ORDER — CEFAZOLIN SODIUM-DEXTROSE 2-3 GM-% IV SOLR
INTRAVENOUS | Status: DC | PRN
  Administered 2012-04-09: 2 g via INTRAVENOUS

## 2012-04-09 MED ORDER — DEXAMETHASONE SODIUM PHOSPHATE 4 MG/ML IJ SOLN
4.0000 mg | Freq: Once | INTRAMUSCULAR | Status: AC
  Administered 2012-04-09: 4 mg via INTRAVENOUS

## 2012-04-09 MED ORDER — BUPIVACAINE-EPINEPHRINE PF 0.5-1:200000 % IJ SOLN
INTRAMUSCULAR | Status: AC
  Filled 2012-04-09: qty 10

## 2012-04-09 MED ORDER — MIDAZOLAM HCL 2 MG/2ML IJ SOLN
1.0000 mg | INTRAMUSCULAR | Status: DC | PRN
  Administered 2012-04-09: 2 mg via INTRAVENOUS

## 2012-04-09 MED ORDER — 0.9 % SODIUM CHLORIDE (POUR BTL) OPTIME
TOPICAL | Status: DC | PRN
  Administered 2012-04-09: 1000 mL

## 2012-04-09 MED ORDER — FENTANYL CITRATE 0.05 MG/ML IJ SOLN
INTRAMUSCULAR | Status: AC
  Filled 2012-04-09: qty 4

## 2012-04-09 MED ORDER — ROCURONIUM BROMIDE 50 MG/5ML IV SOLN
INTRAVENOUS | Status: AC
  Filled 2012-04-09: qty 1

## 2012-04-09 MED ORDER — ONDANSETRON HCL 4 MG/2ML IJ SOLN
INTRAMUSCULAR | Status: AC
  Filled 2012-04-09: qty 2

## 2012-04-09 MED ORDER — KETOROLAC TROMETHAMINE 30 MG/ML IJ SOLN
30.0000 mg | Freq: Once | INTRAMUSCULAR | Status: AC
  Administered 2012-04-09: 30 mg via INTRAVENOUS

## 2012-04-09 MED ORDER — ARTIFICIAL TEARS OP OINT
TOPICAL_OINTMENT | OPHTHALMIC | Status: AC
  Filled 2012-04-09: qty 3.5

## 2012-04-09 MED ORDER — PROPOFOL 10 MG/ML IV EMUL
INTRAVENOUS | Status: DC | PRN
  Administered 2012-04-09: 30 mg via INTRAVENOUS
  Administered 2012-04-09: 110 mg via INTRAVENOUS

## 2012-04-09 MED ORDER — CEFAZOLIN SODIUM-DEXTROSE 2-3 GM-% IV SOLR
INTRAVENOUS | Status: AC
  Filled 2012-04-09: qty 50

## 2012-04-09 MED ORDER — GLYCOPYRROLATE 0.2 MG/ML IJ SOLN
INTRAMUSCULAR | Status: DC | PRN
  Administered 2012-04-09: 0.6 mg via INTRAVENOUS

## 2012-04-09 MED ORDER — FENTANYL CITRATE 0.05 MG/ML IJ SOLN
INTRAMUSCULAR | Status: AC
  Filled 2012-04-09: qty 2

## 2012-04-09 MED ORDER — MIDAZOLAM HCL 2 MG/2ML IJ SOLN
INTRAMUSCULAR | Status: AC
  Filled 2012-04-09: qty 2

## 2012-04-09 MED ORDER — LACTATED RINGERS IV SOLN
INTRAVENOUS | Status: DC
  Administered 2012-04-09: 10:00:00 via INTRAVENOUS

## 2012-04-09 MED ORDER — FENTANYL CITRATE 0.05 MG/ML IJ SOLN
25.0000 ug | INTRAMUSCULAR | Status: DC | PRN
  Administered 2012-04-09 (×2): 50 ug via INTRAVENOUS

## 2012-04-09 SURGICAL SUPPLY — 35 items
APPLICATOR COTTON TIP 6IN STRL (MISCELLANEOUS) IMPLANT
BAG HAMPER (MISCELLANEOUS) ×3 IMPLANT
CLOTH BEACON ORANGE TIMEOUT ST (SAFETY) ×3 IMPLANT
COAGULATOR SUCT SWTCH 10FR 6 (ELECTROSURGICAL) IMPLANT
COVER LIGHT HANDLE STERIS (MISCELLANEOUS) ×6 IMPLANT
DECANTER SPIKE VIAL GLASS SM (MISCELLANEOUS) IMPLANT
ELECT REM PT RETURN 9FT ADLT (ELECTROSURGICAL) ×3
ELECTRODE REM PT RTRN 9FT ADLT (ELECTROSURGICAL) ×2 IMPLANT
FORMALIN 10 PREFIL 120ML (MISCELLANEOUS) ×15 IMPLANT
FORMALIN 10 PREFIL 480ML (MISCELLANEOUS) IMPLANT
GAUZE SPONGE 4X4 16PLY XRAY LF (GAUZE/BANDAGES/DRESSINGS) ×3 IMPLANT
GLOVE BIOGEL PI IND STRL 8 (GLOVE) ×2 IMPLANT
GLOVE BIOGEL PI INDICATOR 8 (GLOVE) ×1
GLOVE ECLIPSE 6.5 STRL STRAW (GLOVE) ×3 IMPLANT
GLOVE ECLIPSE 8.0 STRL XLNG CF (GLOVE) ×3 IMPLANT
GLOVE EXAM NITRILE MD LF STRL (GLOVE) ×3 IMPLANT
GLOVE INDICATOR 7.0 STRL GRN (GLOVE) ×3 IMPLANT
GOWN STRL REIN XL XLG (GOWN DISPOSABLE) ×6 IMPLANT
KIT ROOM TURNOVER APOR (KITS) ×3 IMPLANT
LASER FIBER DISP 1000U (UROLOGICAL SUPPLIES) ×3 IMPLANT
MANIFOLD NEPTUNE II (INSTRUMENTS) ×3 IMPLANT
MEDICHOICE TRU-PUNCH DISPOSABLE BIOPSY PUNCH ×3 IMPLANT
NEEDLE HYPO 25X1 1.5 SAFETY (NEEDLE) ×3 IMPLANT
NS IRRIG 1000ML POUR BTL (IV SOLUTION) ×3 IMPLANT
PACK BASIC III (CUSTOM PROCEDURE TRAY) ×1
PACK PERI GYN (CUSTOM PROCEDURE TRAY) ×3 IMPLANT
PACK SRG BSC III STRL LF ECLPS (CUSTOM PROCEDURE TRAY) ×2 IMPLANT
PAD ARMBOARD 7.5X6 YLW CONV (MISCELLANEOUS) ×3 IMPLANT
SET BASIN LINEN APH (SET/KITS/TRAYS/PACK) ×3 IMPLANT
SUT MON AB 3-0 SH 27 (SUTURE) ×15 IMPLANT
SUT VIC AB 3-0 SH 27 (SUTURE)
SUT VIC AB 3-0 SH 27X BRD (SUTURE) IMPLANT
SYR CONTROL 10ML LL (SYRINGE) ×3 IMPLANT
TOWEL OR 17X26 4PK STRL BLUE (TOWEL DISPOSABLE) ×3 IMPLANT
WATER STERILE IRR 1000ML POUR (IV SOLUTION) ×3 IMPLANT

## 2012-04-09 NOTE — Op Note (Signed)
Preoperative diagnosis:  Multiple vulvar lesions, some consistent with warty. change and some consistent with vulvar intraepithelial lesion grade 3                                         Remote history of VIN III  Postoperative diagnosis:  Same as above  Procedure:  Excision of multiple full for lesions, partial vulvectomy                     Multiple vulvar biopsies  Surgeon:  [name]  Anesthesia:  Gen. Endotracheal  Findings:  Patient presented to the office a week ago with complaint of multiple vulvar lesions left and right.  She related a history had of having biopsies done in the office of Dr. Lisette Grinder which returned as grade 3 vulvar dysplasia but never had any followup or definitive therapy.  She states that was 10 years ago.  On exam she has multiple lesions some of which appear to be condyloma some of which appeared to be vulvar dysplasia and possibly invasive cancer.  The patient was away her that our goal today was 2 biopsy and/or excise the lesions we are possible for diagnostic purposes and that we would return to the OR either here or with our GYN oncologist in [place] for definitive therapeutic management.  Description of operation:  Patient was taken to the operating room where she was placed in the supine position and underwent general endotracheal anesthesia.  She was  placed in the dorsal lithotomy position.  She was prepped and draped in the usual sterile fashion.  Each vulvar lesion was evaluated and managed separately.  She had a large verrucous lesion of the left labia majora which was excised completely and closed with interrupted sutures.   This was specimen #1.  She had a lesion of the left labia minora close to the clitoris which I was concerned for malignancy.  Two 4 mm punch biopsies were done of this lesion.  Both were closed with interrupted sutures.  This was specimen #2.  She had a lesion at the introitus on the right at approximately 7:00 and 2-4 mm punch  biopsies were performed and closed with interrupted sutures.  This was specimen #3.  There was a lesion of the right labia majora at 9:00 which was excised completely.  It was closed with interrupted suture.  This was specimen #4.  Finally there was a large lesion on the right hyperpigmented with a dense acetowhite changes from about the introitus at 6:00 all the way to 8:00.  4-4 mm punch biopsies were performed and all closed with interrupted figure-of-eight sutures.  I picked the areas that were most concerning for possible invasive vulvar cancer.  This was specimen #5.  I evaluated all the lesions and all were found to be hemostatic.  I cleaned the area once more with surgery care.  The patient was awakened from anesthesia taken to recovery room in good stable condition all counts were correct.  Blood loss was minimal.  She received 2 g of Ancef and 30 mg of Toradol preoperatively.  Kiasia Chou H 04/09/2012 11:43 AM

## 2012-04-09 NOTE — Anesthesia Postprocedure Evaluation (Signed)
  Anesthesia Post-op Note  Patient: [name]  Procedure(s) Performed: Procedure(s) (LRB) with comments: VULVECTOMY PARTIAL (N/A) VULVAR BIOPSY (N/A)  Patient Location: PACU  Anesthesia Type: General  Level of Consciousness: awake, alert , oriented and patient cooperative  Airway and Oxygen Therapy: Patient Spontanous Breathing and Patient connected to face mask oxygen  Post-op Pain: mild  Post-op Assessment: Post-op Vital signs reviewed, Patient's Cardiovascular Status Stable, Respiratory Function Stable, Patent Airway and No signs of Nausea or vomiting  Post-op Vital Signs: Reviewed and stable  Complications: No apparent anesthesia complications  

## 2012-04-09 NOTE — Transfer of Care (Signed)
  Anesthesia Post-op Note  Patient: [name]  Procedure(s) Performed: Procedure(s) (LRB) with comments: VULVECTOMY PARTIAL (N/A) VULVAR BIOPSY (N/A)  Patient Location: PACU  Anesthesia Type: General  Level of Consciousness: awake, alert , oriented and patient cooperative  Airway and Oxygen Therapy: Patient Spontanous Breathing and Patient connected to face mask oxygen  Post-op Pain: mild  Post-op Assessment: Post-op Vital signs reviewed, Patient's Cardiovascular Status Stable, Respiratory Function Stable, Patent Airway and No signs of Nausea or vomiting  Post-op Vital Signs: Reviewed and stable  Complications: No apparent anesthesia complications

## 2012-04-09 NOTE — Anesthesia Procedure Notes (Signed)
Procedure Name: Intubation Date/Time: 04/09/2012 10:49 AM Performed by: Carolyne Littles, Antoinett Dorman L Pre-anesthesia Checklist: Patient identified, Patient being monitored, Timeout performed, Emergency Drugs available and Suction available Patient Re-evaluated:Patient Re-evaluated prior to inductionOxygen Delivery Method: Circle System Utilized Preoxygenation: Pre-oxygenation with 100% oxygen Intubation Type: IV induction Ventilation: Mask ventilation without difficulty Laryngoscope Size: Mac and 3 Grade View: Grade I Tube type: Oral Tube size: 7.0 mm Number of attempts: 1 Airway Equipment and Method: stylet Placement Confirmation: ETT inserted through vocal cords under direct vision,  positive ETCO2 and breath sounds checked- equal and bilateral Secured at: 21 cm Tube secured with: Tape Dental Injury: Teeth and Oropharynx as per pre-operative assessment

## 2012-04-09 NOTE — Preoperative (Signed)
Beta Blockers   Reason not to administer Beta Blockers:Not Applicable 

## 2012-04-09 NOTE — H&P (Signed)
[name] is an 56 y.o. female who gives a history of VINIII about 10 years ago with multiple office biopsies done by Dr Lisette Grinder.  She had no further treatment of follow up since that time.  On exam now, she has multiple lesions, some verrucous, some acetowhite, warty, flat.  Because of the extent of the vulvar disease I am bringing her to the OR for biopsy of lesions and removal where appropriate.  No LMP recorded. Patient has had a hysterectomy.    Past Medical History  Diagnosis Date  . Coronary artery disease     status post stenting of the aortic and iliac vessels by Dr. Allyson Sabal. Repeat aortic stenting 2010  . Left ventricular dysfunction     hx of with ejection fraction of 35% range.  . Carotid artery disease     hx of carotid cerebrovascular disease with 0-39% bilateral internal carotid artery setenosis   . Non Hodgkin's lymphoma   . Chronic back pain     status post lumbar surgery  . Hyperlipidemia   . Hypertension   . COPD (chronic obstructive pulmonary disease)   . Shortness of breath   . Cancer     hx of non hogdin lymphoma  . Stroke   . CHF (congestive heart failure)   . Blood transfusion   . Arthritis   . Myocardial infarction   . Peripheral vascular disease   . GERD (gastroesophageal reflux disease)   . Anemia     Past Surgical History  Procedure Date  . Lumar surg   . Back surgery     x 2  . Lung biopsy   . Cardiac catheterization   . Abdominal aortogram 08/08/2011  . Abdominal hysterectomy     partial  . Portacath placement     pt says its for chemo only and its an old port   . Dilation and curettage of uterus     Family History  Problem Relation Age of Onset  . Coronary artery disease      positive for vascular and cardiac disease  . Heart disease Mother   . Lung cancer Mother   . Heart attack Mother   . Heart failure Mother   . Heart disease Father   . Kidney cancer Father   . Heart attack Father   . Heart failure Father   . Diabetes  Sister   . Aortic aneurysm Sister   . Heart attack Brother   . Asthma Son   . Sleep apnea Son   . Endometriosis Daughter     Social History:  reports that she has been smoking Cigarettes.  She has a 20 pack-year smoking history. She has never used smokeless tobacco. She reports that she does not drink alcohol or use illicit drugs.  Allergies: No Known Allergies  Prescriptions prior to admission  Medication Sig Dispense Refill  . albuterol (VENTOLIN HFA) 108 (90 BASE) MCG/ACT inhaler Inhale 2 puffs into the lungs every 6 (six) hours as needed. For asthma/wheezing      . ALPRAZolam (XANAX) 1 MG tablet Take 1 mg by mouth 3 (three) times daily.       [name] amitriptyline (ELAVIL) 50 MG tablet Take 50 mg by mouth at bedtime.      [name] aspirin 81 MG EC tablet Take 81 mg by mouth daily.        [name] buPROPion (WELLBUTRIN XL) 150 MG 24 hr tablet Take 150 mg by mouth daily.      [name]  cholecalciferol (VITAMIN D) 1000 UNITS tablet Take 2,000 Units by mouth daily.       . clopidogrel (PLAVIX) 75 MG tablet Take 1 tablet (75 mg total) by mouth daily.  30 tablet  11  . cycloSPORINE (RESTASIS) 0.05 % ophthalmic emulsion Place 1 drop into both eyes 2 (two) times daily.      . diazepam (VALIUM) 5 MG tablet Take 5 mg by mouth every 6 (six) hours as needed. For muscle spasms      . metoprolol (LOPRESSOR) 50 MG tablet Take 25 mg by mouth 2 (two) times daily.      [name] NEXIUM 40 MG capsule Take 40 mg by mouth Twice daily.      [name] NITROSTAT 0.4 MG SL tablet DISSOLVE 1 TABLET UNDER TONGUE EVERY 5 MINUTES UP TO 15 MIN FOR CHESTPAIN. IF NO RELIEF CALL 911.  25 each  3  . oxyCODONE (OXYCONTIN) 80 MG 12 hr tablet Take 80 mg by mouth 4 (four) times daily.      [name] oxycodone (ROXICODONE) 30 MG immediate release tablet Take 30 mg by mouth every 4 (four) hours as needed. For breakthrough pain      . rosuvastatin (CRESTOR) 40 MG tablet Take 40 mg by mouth daily.        [name] sulfamethoxazole-trimethoprim (BACTRIM DS) 800-160 MG per tablet Take  1 tablet by mouth 2 (two) times a week. Takes every Saturday and Sunday      . vitamin B-12 (CYANOCOBALAMIN) 1000 MCG tablet Take 1,000 mcg by mouth daily.        ROS  Review of Systems  Constitutional: Negative for fever, chills, weight loss, malaise/fatigue and diaphoresis.  HENT: Negative for hearing loss, ear pain, nosebleeds, congestion, sore throat, neck pain, tinnitus and ear discharge.   Eyes: Negative for blurred vision, double vision, photophobia, pain, discharge and redness.  Respiratory: Negative for cough, hemoptysis, sputum production, shortness of breath, wheezing and stridor.   Cardiovascular: Negative for chest pain, palpitations, orthopnea, claudication, leg swelling and PND.  Gastrointestinal: Negative for abdominal pain. Negative for heartburn, nausea, vomiting, diarrhea, constipation, blood in stool and melena.  Genitourinary: Negative for dysuria, urgency, frequency, hematuria and flank pain.  Musculoskeletal: Negative for myalgias, back pain, joint pain and falls.  Skin: Negative for itching and rash.  Neurological: Negative for dizziness, tingling, tremors, sensory change, speech change, focal weakness, seizures, loss of consciousness, weakness and headaches.  Endo/Heme/Allergies: Negative for environmental allergies and polydipsia. Does not bruise/bleed easily.  Psychiatric/Behavioral: Negative for depression, suicidal ideas, hallucinations, memory loss and substance abuse. The patient is not nervous/anxious and does not have insomnia.      Temperature 97.9 F (36.6 C), temperature source Oral. Physical Exam Physical Exam  Vitals reviewed. Constitutional: [name] oriented to person, place, and time. She appears well-developed and well-nourished.  HENT:  Head: Normocephalic and atraumatic.  Right Ear: External ear normal.  Left Ear: External ear normal.  Nose: Nose normal.  Mouth/Throat: Oropharynx is clear and moist.  Eyes: Conjunctivae and EOM are normal.  Pupils are equal, round, and reactive to light. Right eye exhibits no discharge. Left eye exhibits no discharge. No scleral icterus.  Neck: Normal range of motion. Neck supple. No tracheal deviation present. No thyromegaly present.  Cardiovascular: Normal rate, regular rhythm, normal heart sounds and intact distal pulses.  Exam reveals no gallop and no friction rub.   No murmur heard. Respiratory: Effort normal and breath sounds normal. No respiratory distress. She has no wheezes. She has  no rales. She exhibits no tenderness.  GI: Soft. Bowel sounds are normal. She exhibits no distension and no mass. There is tenderness. There is no rebound and no guarding.  Genitourinary:       Vulva is significant for multiple lesions left and right vulva, the introitus, and they appear to be some warty, some VIN, no definitive invasive cancer Vagina is pink moist without discharge Cervix normal in appearance and pap is normal Uterus is absent Adnexa is negative Musculoskeletal: Normal range of motion. She exhibits no edema and no tenderness.  Neurological: [name] alert and oriented to person, place, and time. She has normal reflexes. She displays normal reflexes. No cranial nerve deficit. She exhibits normal muscle tone. Coordination normal.  Skin: Skin is warm and dry. No rash noted. No erythema. No pallor.  Psychiatric: She has a normal mood and affect. Her behavior is normal. Judgment and thought content normal.     Recent Results (from the past 336 hour(s))  URINALYSIS, ROUTINE W REFLEX MICROSCOPIC   Collection Time   04/07/12 10:45 AM      Component Value Range   Color, Urine YELLOW  YELLOW   APPearance CLEAR  CLEAR   Specific Gravity, Urine 1.015  1.005 - 1.030   pH 6.0  5.0 - 8.0   Glucose, UA NEGATIVE  NEGATIVE mg/dL   Hgb urine dipstick NEGATIVE  NEGATIVE   Bilirubin Urine NEGATIVE  NEGATIVE   Ketones, ur NEGATIVE  NEGATIVE mg/dL   Protein, ur NEGATIVE  NEGATIVE mg/dL   Urobilinogen, UA  0.2  0.0 - 1.0 mg/dL   Nitrite NEGATIVE  NEGATIVE   Leukocytes, UA NEGATIVE  NEGATIVE  SURGICAL PCR SCREEN   Collection Time   04/07/12 11:15 AM      Component Value Range   MRSA, PCR NEGATIVE  NEGATIVE   Staphylococcus aureus NEGATIVE  NEGATIVE  HCG, SERUM, QUALITATIVE   Collection Time   04/07/12 11:15 AM      Component Value Range   Preg, Serum NEGATIVE  NEGATIVE  CBC WITH DIFFERENTIAL   Collection Time   04/07/12 11:15 AM      Component Value Range   WBC 6.3  4.0 - 10.5 K/uL   RBC 3.87  3.87 - 5.11 MIL/uL   Hemoglobin 12.8  12.0 - 15.0 g/dL   HCT 46.9  62.9 - 52.8 %   MCV 95.3  78.0 - 100.0 fL   MCH 33.1  26.0 - 34.0 pg   MCHC 34.7  30.0 - 36.0 g/dL   RDW 41.3  24.4 - 01.0 %   Platelets 116 (*) 150 - 400 K/uL   Neutrophils Relative 64  43 - 77 %   Neutro Abs 4.0  1.7 - 7.7 K/uL   Lymphocytes Relative 28  12 - 46 %   Lymphs Abs 1.7  0.7 - 4.0 K/uL   Monocytes Relative 8  3 - 12 %   Monocytes Absolute 0.5  0.1 - 1.0 K/uL   Eosinophils Relative 1  0 - 5 %   Eosinophils Absolute 0.1  0.0 - 0.7 K/uL   Basophils Relative 0  0 - 1 %   Basophils Absolute 0.0  0.0 - 0.1 K/uL  COMPREHENSIVE METABOLIC PANEL   Collection Time   04/07/12 11:15 AM      Component Value Range   Sodium 136  135 - 145 mEq/L   Potassium 3.9  3.5 - 5.1 mEq/L   Chloride 99  96 - 112 mEq/L  CO2 29  19 - 32 mEq/L   Glucose, Bld 80  70 - 99 mg/dL   BUN 18  6 - 23 mg/dL   Creatinine, Ser 1.61 (*) 0.50 - 1.10 mg/dL   Calcium 9.2  8.4 - 09.6 mg/dL   Total Protein 6.4  6.0 - 8.3 g/dL   Albumin 3.5  3.5 - 5.2 g/dL   AST 17  0 - 37 U/L   ALT 15  0 - 35 U/L   Alkaline Phosphatase 99  39 - 117 U/L   Total Bilirubin 0.2 (*) 0.3 - 1.2 mg/dL   GFR calc non Af Amer 52 (*) >90 mL/min   GFR calc Af Amer 61 (*) >90 mL/min     No results found.  Assessment/Plan: 1.  Multiple vulvar lesions, bilateral  For biopsy, excision and possible laser of lesion for pathological evaluation.  Patient may need more  significant treatment depending on biopsies.  Airika Alkhatib H 04/09/2012, 10:09 AM

## 2012-04-09 NOTE — Anesthesia Preprocedure Evaluation (Signed)
Anesthesia Evaluation  Patient identified by MRN, date of birth, ID band Patient awake    Reviewed: Allergy & Precautions, H&P , NPO status , Patient's Chart, lab work & pertinent test results  Airway Mallampati: II      Dental  (+) Poor Dentition, Missing and Chipped   Pulmonary shortness of breath and with exertion, COPD COPD inhaler, Current Smoker,    + decreased breath sounds      Cardiovascular hypertension, Pt. on medications + CAD, + Past MI, + Peripheral Vascular Disease (severe PVD, illiac + aortic stents) and +CHF Rhythm:Regular Rate:Normal     Neuro/Psych CVA, No Residual Symptoms    GI/Hepatic GERD-  ,  Endo/Other    Renal/GU Renal disease     Musculoskeletal   Abdominal   Peds  Hematology   Anesthesia Other Findings   Reproductive/Obstetrics                           Anesthesia Physical Anesthesia Plan  ASA: III  Anesthesia Plan: General   Post-op Pain Management:    Induction: Intravenous, Rapid sequence and Cricoid pressure planned  Airway Management Planned: Oral ETT  Additional Equipment:   Intra-op Plan:   Post-operative Plan: Extubation in OR  Informed Consent: I have reviewed the patients History and Physical, chart, labs and discussed the procedure including the risks, benefits and alternatives for the proposed anesthesia with the patient or authorized representative who has indicated his/her understanding and acceptance.     Plan Discussed with:   Anesthesia Plan Comments:         Anesthesia Quick Evaluation

## 2012-04-17 ENCOUNTER — Encounter (HOSPITAL_COMMUNITY): Payer: Self-pay | Admitting: Obstetrics & Gynecology

## 2012-04-26 ENCOUNTER — Other Ambulatory Visit: Payer: Self-pay | Admitting: Cardiology

## 2012-05-02 ENCOUNTER — Encounter: Payer: Self-pay | Admitting: Gynecology

## 2012-05-02 ENCOUNTER — Ambulatory Visit: Payer: Medicare Other | Attending: Gynecology | Admitting: Gynecology

## 2012-05-02 VITALS — BP 92/52 | HR 68 | Temp 98.0°F | Resp 16 | Ht 65.39 in | Wt 144.7 lb

## 2012-05-02 DIAGNOSIS — Z8489 Family history of other specified conditions: Secondary | ICD-10-CM | POA: Insufficient documentation

## 2012-05-02 DIAGNOSIS — Z8051 Family history of malignant neoplasm of kidney: Secondary | ICD-10-CM | POA: Insufficient documentation

## 2012-05-02 DIAGNOSIS — I252 Old myocardial infarction: Secondary | ICD-10-CM | POA: Insufficient documentation

## 2012-05-02 DIAGNOSIS — G8929 Other chronic pain: Secondary | ICD-10-CM | POA: Insufficient documentation

## 2012-05-02 DIAGNOSIS — Z833 Family history of diabetes mellitus: Secondary | ICD-10-CM | POA: Insufficient documentation

## 2012-05-02 DIAGNOSIS — I2510 Atherosclerotic heart disease of native coronary artery without angina pectoris: Secondary | ICD-10-CM | POA: Insufficient documentation

## 2012-05-02 DIAGNOSIS — I779 Disorder of arteries and arterioles, unspecified: Secondary | ICD-10-CM | POA: Insufficient documentation

## 2012-05-02 DIAGNOSIS — D071 Carcinoma in situ of vulva: Secondary | ICD-10-CM | POA: Insufficient documentation

## 2012-05-02 DIAGNOSIS — Z801 Family history of malignant neoplasm of trachea, bronchus and lung: Secondary | ICD-10-CM | POA: Insufficient documentation

## 2012-05-02 DIAGNOSIS — K219 Gastro-esophageal reflux disease without esophagitis: Secondary | ICD-10-CM | POA: Insufficient documentation

## 2012-05-02 DIAGNOSIS — F17200 Nicotine dependence, unspecified, uncomplicated: Secondary | ICD-10-CM | POA: Insufficient documentation

## 2012-05-02 DIAGNOSIS — I10 Essential (primary) hypertension: Secondary | ICD-10-CM | POA: Insufficient documentation

## 2012-05-02 DIAGNOSIS — Z87898 Personal history of other specified conditions: Secondary | ICD-10-CM | POA: Insufficient documentation

## 2012-05-02 DIAGNOSIS — Z8249 Family history of ischemic heart disease and other diseases of the circulatory system: Secondary | ICD-10-CM | POA: Insufficient documentation

## 2012-05-02 DIAGNOSIS — M549 Dorsalgia, unspecified: Secondary | ICD-10-CM | POA: Insufficient documentation

## 2012-05-02 DIAGNOSIS — C519 Malignant neoplasm of vulva, unspecified: Secondary | ICD-10-CM | POA: Insufficient documentation

## 2012-05-02 NOTE — Patient Instructions (Signed)
We will contact you schedule surgery.

## 2012-05-02 NOTE — Progress Notes (Signed)
Consult Note: Gyn-Onc   [name] 56 y.o. female  Chief Complaint  Patient presents with  . VIN III    New patient      HPI: 56-year-old white female seen in consultation at the request of Dr. 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 Patient has a long-standing history of vulvar irritation. Biopsies were obtained on October 9 revealing squamous cell carcinoma in situ number of biopsy sites. 2 biopsy sites did show superficially invasive squamous cell carcinoma. On discussion with Dr. Li, the pathologist, I am told that these are less than 1 mm of invasion. The patient denies any bleeding or any other gynecologic problems.  Review of Systems:10 point review of systems is negative as noted above.   Vitals: Blood pressure 92/52, pulse 68, temperature 98 F (36.7 C), resp. rate 16, height 5' 5.39" (1.661 m), weight 144 lb 11.2 oz (65.635 kg).  Physical Exam: General : The patient is a healthy woman in no acute distress.  HEENT: normocephalic, extraoccular movements normal; neck is supple without thyromegally  Lynphnodes: Supraclavicular and inguinal nodes not enlarged  Abdomen: Soft, non-tender, no ascites, no organomegally, no masses, no hernias  Pelvic:  EGBUS: Normal female , there are a number of vulvar lesions. At 7:00 in the posterior right vulva is approximately 4 cm area of hyperpigmented raised epithelium. Superior to this on the labia minora is a small area of white epithelium. On the left vulva in the labia majora is an incision line which is healing well and further medially along the labia minora is another area of raised white epithelium. Vagina: Normal, no lesions  Urethra and Bladder: Normal, non-tender  Cervix: Surgically absent  Uterus: Surgically absent  Bi-manual examination: Non-tender; no adenxal masses or nodularity  Rectal: normal sphincter tone, no masses, no blood  Lower  extremities: No edema or varicosities. Normal range of motion    Assessment/Plan: Carcinoma in situ of the vulva which is multifocal. There are 2 areas on previous biopsies that have superficial invasion less than 1 mm. I had a lengthy discussion with the patient her brother and sister about management options. Essentially a wide local excision/superficial vulvectomy would be recommended. They understand that a deeper invasion may be found on final pathology review of the entire specimen. They wished to go ahead with surgery and we'll schedule that upon my next surgical date in [place].  In the interim the patient is strongly encouraged to discontinue smoking as it contributes to her vulvar problem. She has a number of other medical problems that need to be addressed in the preoperative preparation.  No Known Allergies  Past Medical History  Diagnosis Date  . Coronary artery disease     status post stenting of the aortic and iliac vessels by Dr. Berry. Repeat aortic stenting 2010  . Left ventricular dysfunction     hx of with ejection fraction of 35% range.  . Carotid artery disease     hx of carotid cerebrovascular disease with 0-39% bilateral internal carotid artery setenosis   . Non Hodgkin's lymphoma   . Chronic back pain     status post lumbar surgery  . Hyperlipidemia   . Hypertension   . COPD (chronic obstructive pulmonary disease)   . Shortness of breath   . Cancer     hx of non hogdin lymphoma  . Stroke   . CHF (congestive heart failure)   . Blood   transfusion   . Arthritis   . Myocardial infarction   . Peripheral vascular disease   . GERD (gastroesophageal reflux disease)   . Anemia     Past Surgical History  Procedure Date  . Lumar surg   . Back surgery     x 2  . Lung biopsy   . Cardiac catheterization   . Abdominal aortogram 08/08/2011  . Abdominal hysterectomy     partial  . Portacath placement     pt says its for chemo only and its an old port   .  Dilation and curettage of uterus   . Vulva /perineum biopsy 04/09/2012    Procedure: VULVAR BIOPSY;  Surgeon: Luther H Eure, MD;  Location: [facility];  Service: Gynecology;  Laterality: N/A;    Current Outpatient Prescriptions  Medication Sig Dispense Refill  . albuterol (VENTOLIN HFA) 108 (90 BASE) MCG/ACT inhaler Inhale 2 puffs into the lungs every 6 (six) hours as needed. For asthma/wheezing      . ALPRAZolam (XANAX) 1 MG tablet Take 1 mg by mouth 3 (three) times daily.       . amitriptyline (ELAVIL) 50 MG tablet Take 50 mg by mouth at bedtime.      . aspirin 81 MG EC tablet Take 81 mg by mouth daily.        . buPROPion (WELLBUTRIN XL) 150 MG 24 hr tablet Take 150 mg by mouth daily.      . cholecalciferol (VITAMIN D) 1000 UNITS tablet Take 2,000 Units by mouth daily.       . clopidogrel (PLAVIX) 75 MG tablet Take 1 tablet (75 mg total) by mouth daily.  30 tablet  11  . cycloSPORINE (RESTASIS) 0.05 % ophthalmic emulsion Place 1 drop into both eyes 2 (two) times daily.      . diazepam (VALIUM) 5 MG tablet Take 5 mg by mouth every 6 (six) hours as needed. For muscle spasms      . LOPRESSOR 50 MG tablet TAKE (1/2) TABLET BY MOUTH TWICE DAILY.  30 tablet  6  . NEXIUM 40 MG capsule Take 40 mg by mouth Twice daily.      . NITROSTAT 0.4 MG SL tablet DISSOLVE 1 TABLET UNDER TONGUE EVERY 5 MINUTES UP TO 15 MIN FOR CHESTPAIN. IF NO RELIEF CALL 911.  25 each  3  . oxyCODONE (OXYCONTIN) 80 MG 12 hr tablet Take 80 mg by mouth 4 (four) times daily.      . oxycodone (ROXICODONE) 30 MG immediate release tablet Take 30 mg by mouth every 4 (four) hours as needed. For breakthrough pain      . rosuvastatin (CRESTOR) 40 MG tablet Take 40 mg by mouth daily.        . sulfamethoxazole-trimethoprim (BACTRIM DS) 800-160 MG per tablet Take 1 tablet by mouth 2 (two) times a week. Takes every Saturday and Sunday      . vitamin B-12 (CYANOCOBALAMIN) 1000 MCG tablet Take 1,000 mcg by mouth daily.      . ketorolac (TORADOL)  10 MG tablet Take 1 tablet (10 mg total) by mouth every 8 (eight) hours as needed for pain.  15 tablet  0  . ondansetron (ZOFRAN) 8 MG tablet Take 1 tablet (8 mg total) by mouth every 8 (eight) hours as needed for nausea.  10 tablet  0    History   Social History  . Marital Status: Divorced    Spouse Name: N/A    Number of Children: 2  .   Years of Education: N/A   Occupational History  . retired    Social History Main Topics  . Smoking status: Current Every Day Smoker -- 1.0 packs/day for 20 years    Types: Cigarettes  . Smokeless tobacco: Never Used  . Alcohol Use: No  . Drug Use: No  . Sexually Active: No   Other Topics Concern  . Not on file   Social History Narrative  . No narrative on file    Family History  Problem Relation Age of Onset  . Coronary artery disease      positive for vascular and cardiac disease  . Heart disease Mother   . Lung cancer Mother   . Heart attack Mother   . Heart failure Mother   . Heart disease Father   . Kidney cancer Father   . Heart attack Father   . Heart failure Father   . Diabetes Sister   . Aortic aneurysm Sister   . Heart attack Brother   . Asthma Son   . Sleep apnea Son   . Endometriosis Daughter       CLARKE-PEARSON,Aaryanna Hyden L, MD 05/02/2012, 3:36 PM         

## 2012-05-06 ENCOUNTER — Ambulatory Visit (INDEPENDENT_AMBULATORY_CARE_PROVIDER_SITE_OTHER): Payer: Medicare Other | Admitting: Cardiology

## 2012-05-06 ENCOUNTER — Encounter: Payer: Self-pay | Admitting: Cardiology

## 2012-05-06 VITALS — BP 112/49 | HR 80 | Ht 65.0 in | Wt 148.0 lb

## 2012-05-06 DIAGNOSIS — C519 Malignant neoplasm of vulva, unspecified: Secondary | ICD-10-CM

## 2012-05-06 DIAGNOSIS — I2510 Atherosclerotic heart disease of native coronary artery without angina pectoris: Secondary | ICD-10-CM

## 2012-05-06 DIAGNOSIS — I10 Essential (primary) hypertension: Secondary | ICD-10-CM

## 2012-05-06 DIAGNOSIS — E785 Hyperlipidemia, unspecified: Secondary | ICD-10-CM

## 2012-05-06 DIAGNOSIS — I739 Peripheral vascular disease, unspecified: Secondary | ICD-10-CM

## 2012-05-06 DIAGNOSIS — J449 Chronic obstructive pulmonary disease, unspecified: Secondary | ICD-10-CM

## 2012-05-06 NOTE — Progress Notes (Signed)
HPI:  Patient seen as a pre-op.  She has vulvar cancer.  She has multiple medical issues.  She has stable CAD and can walk to her mailbox and back without chest pain. She had cath in 2010, the results of which are listed with total occlusion of the RCA with collaterals.  She has modest LV dysfunction.  She has COPD and has had a stroke two years ago for which she is on ASA and plavix.  Her neurologist is no longer here.  Dr. Juanetta Gosling is her pulmonologist, and she does continue to smoke about one pack per day.  She has known PVD with prior stenting of the abdominal aorta.  Her breathing issues limit her.  She also has known oncologic issues for which she has been treated by Dr. Cleone Slim over a long period of time.  She denies rest chest pain.    Current Outpatient Prescriptions  Medication Sig Dispense Refill  . albuterol (VENTOLIN HFA) 108 (90 BASE) MCG/ACT inhaler Inhale 2 puffs into the lungs every 6 (six) hours as needed. For asthma/wheezing      . ALPRAZolam (XANAX) 1 MG tablet Take 1 mg by mouth 3 (three) times daily.       [name] amitriptyline (ELAVIL) 50 MG tablet Take 50 mg by mouth at bedtime.      [name] aspirin 81 MG EC tablet Take 81 mg by mouth daily.        [name] buPROPion (WELLBUTRIN XL) 150 MG 24 hr tablet Take 150 mg by mouth daily.      . cholecalciferol (VITAMIN D) 1000 UNITS tablet Take 2,000 Units by mouth daily.       . clopidogrel (PLAVIX) 75 MG tablet Take 1 tablet (75 mg total) by mouth daily.  30 tablet  11  . cycloSPORINE (RESTASIS) 0.05 % ophthalmic emulsion Place 1 drop into both eyes 2 (two) times daily.      . diazepam (VALIUM) 5 MG tablet Take 5 mg by mouth every 6 (six) hours as needed. For muscle spasms      . LOPRESSOR 50 MG tablet TAKE (1/2) TABLET BY MOUTH TWICE DAILY.  30 tablet  6  . mupirocin ointment (BACTROBAN) 2 % As needed      . NEXIUM 40 MG capsule Take 40 mg by mouth Twice daily.      [name] NITROSTAT 0.4 MG SL tablet DISSOLVE 1 TABLET UNDER TONGUE EVERY 5 MINUTES UP TO 15  MIN FOR CHESTPAIN. IF NO RELIEF CALL 911.  25 each  3  . oxyCODONE (OXYCONTIN) 80 MG 12 hr tablet Take 80 mg by mouth 4 (four) times daily.      [name] oxycodone (ROXICODONE) 30 MG immediate release tablet Take 30 mg by mouth every 4 (four) hours as needed. For breakthrough pain      . rosuvastatin (CRESTOR) 40 MG tablet Take 40 mg by mouth daily.        . silver sulfADIAZINE (SILVADENE) 1 % cream As needed      . sulfamethoxazole-trimethoprim (BACTRIM DS) 800-160 MG per tablet Take 1 tablet by mouth 2 (two) times a week. Takes every Saturday and Sunday      . vitamin B-12 (CYANOCOBALAMIN) 1000 MCG tablet Take 1,000 mcg by mouth daily.        No Known Allergies  Past Medical History  Diagnosis Date  . Coronary artery disease     status post stenting of the aortic and iliac vessels by Dr. Allyson Sabal. Repeat aortic stenting 2010  .  Left ventricular dysfunction     hx of with ejection fraction of 35% range.  . Carotid artery disease     hx of carotid cerebrovascular disease with 0-39% bilateral internal carotid artery setenosis   . Non Hodgkin's lymphoma   . Chronic back pain     status post lumbar surgery  . Hyperlipidemia   . Hypertension   . COPD (chronic obstructive pulmonary disease)   . Shortness of breath   . Cancer     hx of non hogdin lymphoma  . Stroke   . CHF (congestive heart failure)   . Blood transfusion   . Arthritis   . Myocardial infarction   . Peripheral vascular disease   . GERD (gastroesophageal reflux disease)   . Anemia     Past Surgical History  Procedure Date  . Lumar surg   . Back surgery     x 2  . Lung biopsy   . Cardiac catheterization   . Abdominal aortogram 08/08/2011  . Abdominal hysterectomy     partial  . Portacath placement     pt says its for chemo only and its an old port   . Dilation and curettage of uterus   . Vulva /perineum biopsy 04/09/2012    Procedure: VULVAR BIOPSY;  Surgeon: [name], MD;  Location: [facility];  Service: Gynecology;   Laterality: N/A;    Family History  Problem Relation Age of Onset  . Coronary artery disease      positive for vascular and cardiac disease  . Heart disease Mother   . Lung cancer Mother   . Heart attack Mother   . Heart failure Mother   . Heart disease Father   . Kidney cancer Father   . Heart attack Father   . Heart failure Father   . Diabetes Sister   . Aortic aneurysm Sister   . Heart attack Brother   . Asthma Son   . Sleep apnea Son   . Endometriosis Daughter     History   Social History  . Marital Status: Divorced    Spouse Name: N/A    Number of Children: 2  . Years of Education: N/A   Occupational History  . retired    Social History Main Topics  . Smoking status: Current Every Day Smoker -- 1.0 packs/day for 20 years    Types: Cigarettes  . Smokeless tobacco: Never Used  . Alcohol Use: No  . Drug Use: No  . Sexually Active: No   Other Topics Concern  . Not on file   Social History Narrative  . No narrative on file    ROS: Please see the HPI.  All other systems reviewed and negative.  PHYSICAL EXAM:  BP 112/49  Pulse 80  Ht 5\' 5"  (1.651 m)  Wt 148 lb (67.132 kg)  BMI 24.63 kg/m2  SpO2 100%  General: Thin chronically ill appearing female.   Head:  Normocephalic and atraumatic. Neck: no JVD Lungs:Decrease BS and prolonged expiration.  Heart: Normal S1 and S2.  No murmur, rubs or gallops.  Abdomen:  Normal bowel sounds; soft; non tender; no organomegaly Pulses: bifemoral bruits.  Left louder than right.  Decreased pulses in the feet.   Extremities: No clubbing or cyanosis. No edema.  Good capillary refill.  Neurologic: Alert and oriented x 3.  EKG:  NSR.  WNL.   CATH 2010  FINDINGS: Coronary angiography: Left main coronary artery is widely  patent. It bifurcates into the  LAD and left circumflex.  LAD: The LAD is a large-caliber vessel that courses down and reaches  the LV apex. There was no significant stenosis present. There are two    small diagonal branches present. The septal perforators of the LAD  supply collaterals to the distal right coronary artery.  Left circumflex: The left circumflex is widely patent. It supplies two  OM branches. There was no significant stenosis throughout.  Right coronary artery: The right coronary artery is subtotaled. There  was a long diffuse severe stenosis throughout the midportion of the  vessel. Distally, the posterolateral branch and PDA fill from antegrade  flow as well as left-to-right collaterals.  Abdominal aortogram: There are single renal arteries bilaterally. The  left renal artery has a moderate proximal stenosis present. The right  renal artery is widely patent. The SMA is widely patent. The distal  abdominal aorta has a severe eccentric stenosis present. This is above  previously stented segment in the abdominal aorta. There are stents  extending into both common iliac arteries. There was a long stented  segment into the right common iliac and a short stented segment into the  left common iliac. The indwelling stents in the distal aorta and common  iliac arteries are patent.  Right leg: The right common internal and external iliac arteries are  patent. There is mild disease at the sheath entry site in the common  femoral artery. The SFA, deep femoral, popliteal, tibioperoneal trunk  are all patent. There is three-vessel runoff to the right foot.  Left leg: The common internal and external iliac arteries are all  patent. The common femoral artery is patent. The deep and superficial  femoral arteries are widely patent. There is three-vessel runoff to the  foot. The runoff to the left foot is very sluggish.  ASSESSMENT:  1. Severe single-vessel coronary artery disease with left-to-right  collaterals supplying the distal right coronary artery circulation.  This is stable from previous studies.  2. Severe infrarenal abdominal aortic stenosis with successful  stenting as  outlined above.  3. Three-vessel runoff bilaterally.  RECOMMENDATIONS: I recommend 30 days of dual-antiplatelet therapy with  aspirin and Plavix. The patient should remain on long-term aspirin.  Veverly Fells. Excell Seltzer, MD  Electronically Signed  Veverly Fells. Excell Seltzer, MD  Electronically Signed  MDC/MEDQ D: 01/05/2009 T: 01/06/2009 Job: [id number]   ECHO STUDY  Study Conclusions  - Left ventricle: The cavity size was normal. Wall thickness was normal. Systolic function was normal. The estimated ejection fraction was in the range of 55% to 65%. Wall motion was normal; there were no regional wall motion abnormalities. Left ventricular diastolic function parameters were normal. - Mitral valve: Calcified subcortal apparatus and papillary muscles Calcified annulus. Mildly thickened leaflets . Mild regurgitation. - Atrial septum: No defect or patent foramen ovale was identified. - Pulmonary arteries: PA peak pressure: 32mm Hg (S).     ASSESSMENT AND PLAN:

## 2012-05-06 NOTE — Patient Instructions (Addendum)
Your physician has requested that you have an echocardiogram. Echocardiography is a painless test that uses sound waves to create images of your heart. It provides your doctor with information about the size and shape of your heart and how well your heart's chambers and valves are working. This procedure takes approximately one hour. There are no restrictions for this procedure.  Your physician recommends that you continue on your current medications as directed. Please refer to the Current Medication list given to you today.  Your physician recommends that you schedule a follow-up appointment in: 2 MONTHS

## 2012-05-07 ENCOUNTER — Ambulatory Visit (HOSPITAL_COMMUNITY): Payer: Medicare Other | Attending: Cardiology

## 2012-05-07 ENCOUNTER — Telehealth: Payer: Self-pay | Admitting: Cardiology

## 2012-05-07 DIAGNOSIS — I059 Rheumatic mitral valve disease, unspecified: Secondary | ICD-10-CM | POA: Insufficient documentation

## 2012-05-07 DIAGNOSIS — I369 Nonrheumatic tricuspid valve disorder, unspecified: Secondary | ICD-10-CM | POA: Insufficient documentation

## 2012-05-07 DIAGNOSIS — I2510 Atherosclerotic heart disease of native coronary artery without angina pectoris: Secondary | ICD-10-CM | POA: Insufficient documentation

## 2012-05-07 DIAGNOSIS — J4489 Other specified chronic obstructive pulmonary disease: Secondary | ICD-10-CM | POA: Insufficient documentation

## 2012-05-07 DIAGNOSIS — I10 Essential (primary) hypertension: Secondary | ICD-10-CM | POA: Insufficient documentation

## 2012-05-07 DIAGNOSIS — J449 Chronic obstructive pulmonary disease, unspecified: Secondary | ICD-10-CM | POA: Insufficient documentation

## 2012-05-07 DIAGNOSIS — F17200 Nicotine dependence, unspecified, uncomplicated: Secondary | ICD-10-CM | POA: Insufficient documentation

## 2012-05-07 NOTE — Telephone Encounter (Signed)
New Problem:    Called wanting to say hi and because she saw Dr. Rosalyn Charters pending note. If she is cleared for surgery she also needs to be sure she can stop her plavix and asprin and surgery is scheduled for the 05/20/12.

## 2012-05-07 NOTE — Progress Notes (Signed)
Echocardiogram performed.  

## 2012-05-08 NOTE — Assessment & Plan Note (Signed)
Has bifemoral bruits and known PAD, this can be followed for now.  Smoking cessation advised.

## 2012-05-08 NOTE — Assessment & Plan Note (Signed)
[name] currently controlled.

## 2012-05-08 NOTE — Assessment & Plan Note (Signed)
Her LFTS are ok, but she should have a lipid profile at some point in the near future.

## 2012-05-08 NOTE — Assessment & Plan Note (Signed)
The patient has known CAD and is currently stable and without symptoms.  She had cath in 2010 and had RCA occlusion with collaterals.  She therefore has the potential for ischemia.  [name] on beta blockers and they should be maintained throughout the operative period.  She will need to hold her plavix for a  Requisite five days prior to surgery.  Smoking enhances clopidogrel metabolism so it is important that she meet this five day period.   She continues to smoke and has multiple comorbidities for surgery.  There is potential for ischemia during and after surgery that will not be altered by further testing.  I do believe she should see her pulmonary physician as well prior to surgery and she already has an appointment to see Dr. Juanetta Gosling who knows her well.  I would suggest that she is moderate risk for surgery.

## 2012-05-08 NOTE — Assessment & Plan Note (Signed)
Needs surgery. TS

## 2012-05-08 NOTE — Assessment & Plan Note (Signed)
[name] followed by Dr. Juanetta Gosling.  This is a major potential morbidity for surgery.  She continues to smoke.  I think she should see him prior to any procedure.

## 2012-05-13 NOTE — Telephone Encounter (Signed)
Dr Riley Kill completed this pt's office note and addressed surgical clearance.

## 2012-05-14 ENCOUNTER — Encounter (HOSPITAL_COMMUNITY): Payer: Self-pay | Admitting: Pharmacist

## 2012-05-14 NOTE — Progress Notes (Signed)
Need orders.

## 2012-05-15 ENCOUNTER — Telehealth: Payer: Self-pay | Admitting: *Deleted

## 2012-05-15 NOTE — Progress Notes (Addendum)
05-06-2012 EKG Dr. Riley Kill  CXR 05-16-2012 in  [facility]

## 2012-05-15 NOTE — Telephone Encounter (Signed)
Tc to confirm pt will hold plavix as instructed.

## 2012-05-16 ENCOUNTER — Ambulatory Visit (HOSPITAL_COMMUNITY)
Admission: RE | Admit: 2012-05-16 | Discharge: 2012-05-16 | Disposition: A | Discharge status: Home / Self Care | Payer: Medicare Other | Source: Ambulatory Visit | Attending: Gynecology | Admitting: Gynecology

## 2012-05-16 ENCOUNTER — Encounter (HOSPITAL_COMMUNITY)
Admission: RE | Admit: 2012-05-16 | Discharge: 2012-05-16 | Disposition: A | Discharge status: Home / Self Care | Payer: Medicare Other | Source: Ambulatory Visit | Attending: Gynecology | Admitting: Gynecology

## 2012-05-16 ENCOUNTER — Encounter (HOSPITAL_COMMUNITY): Payer: Self-pay

## 2012-05-16 DIAGNOSIS — Z87898 Personal history of other specified conditions: Secondary | ICD-10-CM | POA: Insufficient documentation

## 2012-05-16 DIAGNOSIS — Z01812 Encounter for preprocedural laboratory examination: Secondary | ICD-10-CM | POA: Insufficient documentation

## 2012-05-16 DIAGNOSIS — J984 Other disorders of lung: Secondary | ICD-10-CM | POA: Insufficient documentation

## 2012-05-16 DIAGNOSIS — J4489 Other specified chronic obstructive pulmonary disease: Secondary | ICD-10-CM | POA: Insufficient documentation

## 2012-05-16 DIAGNOSIS — J449 Chronic obstructive pulmonary disease, unspecified: Secondary | ICD-10-CM | POA: Insufficient documentation

## 2012-05-16 DIAGNOSIS — C519 Malignant neoplasm of vulva, unspecified: Secondary | ICD-10-CM | POA: Insufficient documentation

## 2012-05-16 HISTORY — DX: Depression, unspecified: F32.A

## 2012-05-16 HISTORY — DX: Age-related osteoporosis without current pathological fracture: M81.0

## 2012-05-16 HISTORY — DX: Other specified types of non-hodgkin lymphoma, extranodal and solid organ sites: C85.89

## 2012-05-16 HISTORY — DX: Major depressive disorder, single episode, unspecified: F32.9

## 2012-05-16 HISTORY — DX: Anxiety disorder, unspecified: F41.9

## 2012-05-16 LAB — CBC WITH DIFFERENTIAL/PLATELET
Eosinophils Absolute: 0.1 10*3/uL (ref 0.0–0.7)
Eosinophils Relative: 2 % (ref 0–5)
Lymphs Abs: 1.3 10*3/uL (ref 0.7–4.0)
MCH: 32.4 pg (ref 26.0–34.0)
MCV: 94.1 fL (ref 78.0–100.0)
Platelets: 156 10*3/uL (ref 150–400)
RBC: 3.55 MIL/uL — ABNORMAL LOW (ref 3.87–5.11)
RDW: 13.8 % (ref 11.5–15.5)

## 2012-05-16 LAB — COMPREHENSIVE METABOLIC PANEL
ALT: 16 U/L (ref 0–35)
Calcium: 9.2 mg/dL (ref 8.4–10.5)
Creatinine, Ser: 1.2 mg/dL — ABNORMAL HIGH (ref 0.50–1.10)
GFR calc Af Amer: 58 mL/min — ABNORMAL LOW (ref >90)
Glucose, Bld: 86 mg/dL (ref 70–99)
Sodium: 133 mEq/L — ABNORMAL LOW (ref 135–145)
Total Protein: 6.1 g/dL (ref 6.0–8.3)

## 2012-05-16 LAB — SURGICAL PCR SCREEN
MRSA, PCR: NEGATIVE
Staphylococcus aureus: NEGATIVE

## 2012-05-16 NOTE — Patient Instructions (Signed)
20 [name]  05/16/2012   Your procedure is scheduled on:  Tuesday 05-20-2012   Report to [facility] at  AM.  [phone]  Call this number if you have problems the morning of surgery: [phone]   Remember:   Do not drink liquids or  eat food:After Midnight. Monday night       Take these medicines the morning of surgery with A SIP OF WATER: Lopressor, Wellbutrin XL, Crestor and Oxycontin sip of water pain med if needed   Do not wear jewelry, make-up or nail polish.  Do not wear lotions, powders, or perfumes. You may wear deodorant.  Do not shave 48 hours prior to surgery. Men may shave face and neck.  Do not bring valuables to the hospital.  Contacts, dentures or bridgework may not be worn into surgery.  Leave suitcase in the car. After surgery it may be brought to your room.  For patients admitted to the hospital, checkout time is 11:00 AM the day of discharge.   Patients discharged the day of surgery will not be allowed to drive home.  Name and phone number of your driver: Carlton [phone]  See [facility] Preparing for surgery sheet.   Please read over the following fact sheets that you were given: MRSA Information,Incentive Spirometry

## 2012-05-20 ENCOUNTER — Encounter (HOSPITAL_COMMUNITY)
Admission: RE | Disposition: A | Discharge status: Home / Self Care | Payer: Self-pay | Source: Ambulatory Visit | Attending: Gynecology

## 2012-05-20 ENCOUNTER — Encounter (HOSPITAL_COMMUNITY): Payer: Self-pay | Admitting: Anesthesiology

## 2012-05-20 ENCOUNTER — Ambulatory Visit (HOSPITAL_COMMUNITY): Payer: Medicare Other | Admitting: Anesthesiology

## 2012-05-20 ENCOUNTER — Ambulatory Visit (HOSPITAL_COMMUNITY)
Admission: RE | Admit: 2012-05-20 | Discharge: 2012-05-21 | Disposition: A | Discharge status: Home / Self Care | Payer: Medicare Other | Source: Ambulatory Visit | Attending: Obstetrics & Gynecology | Admitting: Obstetrics & Gynecology

## 2012-05-20 ENCOUNTER — Encounter (HOSPITAL_COMMUNITY): Payer: Self-pay | Admitting: Surgery

## 2012-05-20 ENCOUNTER — Encounter (HOSPITAL_COMMUNITY): Payer: Self-pay | Admitting: *Deleted

## 2012-05-20 DIAGNOSIS — E785 Hyperlipidemia, unspecified: Secondary | ICD-10-CM | POA: Insufficient documentation

## 2012-05-20 DIAGNOSIS — I252 Old myocardial infarction: Secondary | ICD-10-CM | POA: Insufficient documentation

## 2012-05-20 DIAGNOSIS — Z8673 Personal history of transient ischemic attack (TIA), and cerebral infarction without residual deficits: Secondary | ICD-10-CM | POA: Insufficient documentation

## 2012-05-20 DIAGNOSIS — I10 Essential (primary) hypertension: Secondary | ICD-10-CM | POA: Insufficient documentation

## 2012-05-20 DIAGNOSIS — J4489 Other specified chronic obstructive pulmonary disease: Secondary | ICD-10-CM | POA: Insufficient documentation

## 2012-05-20 DIAGNOSIS — C519 Malignant neoplasm of vulva, unspecified: Secondary | ICD-10-CM | POA: Diagnosis present

## 2012-05-20 DIAGNOSIS — K219 Gastro-esophageal reflux disease without esophagitis: Secondary | ICD-10-CM | POA: Insufficient documentation

## 2012-05-20 DIAGNOSIS — J449 Chronic obstructive pulmonary disease, unspecified: Secondary | ICD-10-CM | POA: Insufficient documentation

## 2012-05-20 DIAGNOSIS — C8589 Other specified types of non-Hodgkin lymphoma, extranodal and solid organ sites: Secondary | ICD-10-CM | POA: Insufficient documentation

## 2012-05-20 DIAGNOSIS — Z79899 Other long term (current) drug therapy: Secondary | ICD-10-CM | POA: Insufficient documentation

## 2012-05-20 DIAGNOSIS — I509 Heart failure, unspecified: Secondary | ICD-10-CM | POA: Insufficient documentation

## 2012-05-20 DIAGNOSIS — I2510 Atherosclerotic heart disease of native coronary artery without angina pectoris: Secondary | ICD-10-CM | POA: Insufficient documentation

## 2012-05-20 DIAGNOSIS — Z7982 Long term (current) use of aspirin: Secondary | ICD-10-CM | POA: Insufficient documentation

## 2012-05-20 HISTORY — PX: VULVECTOMY: SHX1086

## 2012-05-20 SURGERY — VULVECTOMY
Anesthesia: General | Site: Vulva | Laterality: Bilateral | Wound class: Clean Contaminated

## 2012-05-20 MED ORDER — LACTATED RINGERS IV SOLN: INTRAVENOUS | Status: DC

## 2012-05-20 MED ORDER — PROMETHAZINE HCL 25 MG/ML IJ SOLN: 6.2500 mg | INTRAMUSCULAR | Status: DC | PRN

## 2012-05-20 MED ORDER — FENTANYL CITRATE 0.05 MG/ML IJ SOLN
INTRAMUSCULAR | Status: DC | PRN
  Administered 2012-05-20 (×2): 50 ug via INTRAVENOUS
  Administered 2012-05-20: 100 ug via INTRAVENOUS
  Administered 2012-05-20: 50 ug via INTRAVENOUS

## 2012-05-20 MED ORDER — NICOTINE 21 MG/24HR TD PT24
21.0000 mg | MEDICATED_PATCH | Freq: Every day | TRANSDERMAL | Status: DC
  Administered 2012-05-20 – 2012-05-21 (×2): 21 mg via TRANSDERMAL
  Filled 2012-05-20 (×2): qty 1

## 2012-05-20 MED ORDER — HYDROMORPHONE HCL PF 1 MG/ML IJ SOLN
0.2500 mg | INTRAMUSCULAR | Status: DC | PRN
  Administered 2012-05-20 (×2): 0.5 mg via INTRAVENOUS

## 2012-05-20 MED ORDER — SILVER SULFADIAZINE 1 % EX CREA
1.0000 "application " | TOPICAL_CREAM | Freq: Two times a day (BID) | CUTANEOUS | Status: DC
  Administered 2012-05-20: 1 via TOPICAL
  Filled 2012-05-20: qty 85

## 2012-05-20 MED ORDER — SULFAMETHOXAZOLE-TMP DS 800-160 MG PO TABS: 1.0000 | ORAL_TABLET | ORAL | Status: DC

## 2012-05-20 MED ORDER — ACETAMINOPHEN 10 MG/ML IV SOLN
INTRAVENOUS | Status: AC
  Filled 2012-05-20: qty 100

## 2012-05-20 MED ORDER — CYCLOSPORINE 0.05 % OP EMUL
1.0000 [drp] | Freq: Two times a day (BID) | OPHTHALMIC | Status: DC
  Administered 2012-05-20 – 2012-05-21 (×2): 1 [drp] via OPHTHALMIC
  Filled 2012-05-20 (×3): qty 1

## 2012-05-20 MED ORDER — AMITRIPTYLINE HCL 50 MG PO TABS
50.0000 mg | ORAL_TABLET | Freq: Every day | ORAL | Status: DC
  Administered 2012-05-20: 50 mg via ORAL
  Filled 2012-05-20 (×2): qty 1

## 2012-05-20 MED ORDER — ACETIC ACID 5 % SOLN
Status: DC | PRN
  Administered 2012-05-20: 1 via TOPICAL

## 2012-05-20 MED ORDER — LIDOCAINE HCL (CARDIAC) 20 MG/ML IV SOLN
INTRAVENOUS | Status: DC | PRN
  Administered 2012-05-20: 75 mg via INTRAVENOUS

## 2012-05-20 MED ORDER — HYDROMORPHONE HCL PF 1 MG/ML IJ SOLN
INTRAMUSCULAR | Status: AC
  Filled 2012-05-20: qty 1

## 2012-05-20 MED ORDER — MIDAZOLAM HCL 5 MG/5ML IJ SOLN
INTRAMUSCULAR | Status: DC | PRN
  Administered 2012-05-20 (×2): 1 mg via INTRAVENOUS

## 2012-05-20 MED ORDER — DEXAMETHASONE SODIUM PHOSPHATE 10 MG/ML IJ SOLN
INTRAMUSCULAR | Status: DC | PRN
  Administered 2012-05-20: 10 mg via INTRAVENOUS

## 2012-05-20 MED ORDER — SILVER SULFADIAZINE 1 % EX CREA
TOPICAL_CREAM | CUTANEOUS | Status: DC
  Filled 2012-05-20: qty 400

## 2012-05-20 MED ORDER — PANTOPRAZOLE SODIUM 40 MG PO TBEC
80.0000 mg | DELAYED_RELEASE_TABLET | Freq: Every day | ORAL | Status: DC
  Administered 2012-05-21: 80 mg via ORAL
  Filled 2012-05-20: qty 2

## 2012-05-20 MED ORDER — ACETIC ACID 5 % SOLN
Status: AC
  Filled 2012-05-20: qty 500

## 2012-05-20 MED ORDER — ALPRAZOLAM 1 MG PO TABS
1.0000 mg | ORAL_TABLET | Freq: Three times a day (TID) | ORAL | Status: DC
  Administered 2012-05-20 – 2012-05-21 (×3): 1 mg via ORAL
  Filled 2012-05-20 (×3): qty 1

## 2012-05-20 MED ORDER — OXYCODONE HCL 80 MG PO TB12: 80.0000 mg | ORAL_TABLET | Freq: Four times a day (QID) | ORAL | Status: DC

## 2012-05-20 MED ORDER — ALBUTEROL SULFATE HFA 108 (90 BASE) MCG/ACT IN AERS: 2.0000 | INHALATION_SPRAY | Freq: Four times a day (QID) | RESPIRATORY_TRACT | Status: DC | PRN

## 2012-05-20 MED ORDER — PNEUMOCOCCAL VAC POLYVALENT 25 MCG/0.5ML IJ INJ
0.5000 mL | INJECTION | INTRAMUSCULAR | Status: DC
  Filled 2012-05-20: qty 0.5

## 2012-05-20 MED ORDER — ONDANSETRON HCL 4 MG PO TABS: 4.0000 mg | ORAL_TABLET | Freq: Four times a day (QID) | ORAL | Status: DC | PRN

## 2012-05-20 MED ORDER — LACTATED RINGERS IV SOLN
INTRAVENOUS | Status: DC | PRN
  Administered 2012-05-20 (×2): via INTRAVENOUS

## 2012-05-20 MED ORDER — LACTATED RINGERS IV SOLN
INTRAVENOUS | Status: DC
  Administered 2012-05-20: 1000 mL via INTRAVENOUS

## 2012-05-20 MED ORDER — NITROGLYCERIN 0.4 MG SL SUBL: 0.4000 mg | SUBLINGUAL_TABLET | SUBLINGUAL | Status: DC | PRN

## 2012-05-20 MED ORDER — 0.9 % SODIUM CHLORIDE (POUR BTL) OPTIME
TOPICAL | Status: DC | PRN
  Administered 2012-05-20: 1000 mL

## 2012-05-20 MED ORDER — DIAZEPAM 5 MG PO TABS: 5.0000 mg | ORAL_TABLET | Freq: Four times a day (QID) | ORAL | Status: DC | PRN

## 2012-05-20 MED ORDER — LIDOCAINE-EPINEPHRINE 1 %-1:100000 IJ SOLN
INTRAMUSCULAR | Status: AC
  Filled 2012-05-20: qty 1

## 2012-05-20 MED ORDER — ACETAMINOPHEN 10 MG/ML IV SOLN
INTRAVENOUS | Status: DC | PRN
  Administered 2012-05-20: 1000 mg via INTRAVENOUS

## 2012-05-20 MED ORDER — ONDANSETRON HCL 4 MG/2ML IJ SOLN
INTRAMUSCULAR | Status: DC | PRN
  Administered 2012-05-20 (×2): 2 mg via INTRAVENOUS

## 2012-05-20 MED ORDER — MORPHINE SULFATE 2 MG/ML IJ SOLN: 2.0000 mg | INTRAMUSCULAR | Status: DC | PRN

## 2012-05-20 MED ORDER — HYDROMORPHONE HCL PF 1 MG/ML IJ SOLN
INTRAMUSCULAR | Status: DC | PRN
  Administered 2012-05-20 (×3): 0.5 mg via INTRAVENOUS

## 2012-05-20 MED ORDER — KCL IN DEXTROSE-NACL 20-5-0.45 MEQ/L-%-% IV SOLN
INTRAVENOUS | Status: DC
  Administered 2012-05-20 – 2012-05-21 (×3): via INTRAVENOUS
  Filled 2012-05-20 (×4): qty 1000

## 2012-05-20 MED ORDER — KETOROLAC TROMETHAMINE 30 MG/ML IJ SOLN
30.0000 mg | Freq: Four times a day (QID) | INTRAMUSCULAR | Status: AC
  Administered 2012-05-20 – 2012-05-21 (×3): 30 mg via INTRAVENOUS
  Filled 2012-05-20 (×2): qty 1

## 2012-05-20 MED ORDER — PROPOFOL 10 MG/ML IV EMUL
INTRAVENOUS | Status: DC | PRN
  Administered 2012-05-20: 50 mg via INTRAVENOUS
  Administered 2012-05-20: 150 mg via INTRAVENOUS

## 2012-05-20 MED ORDER — KETOROLAC TROMETHAMINE 30 MG/ML IJ SOLN
30.0000 mg | Freq: Four times a day (QID) | INTRAMUSCULAR | Status: AC
  Filled 2012-05-20: qty 1

## 2012-05-20 MED ORDER — ENOXAPARIN SODIUM 40 MG/0.4ML ~~LOC~~ SOLN
40.0000 mg | SUBCUTANEOUS | Status: AC
  Administered 2012-05-20: 40 mg via SUBCUTANEOUS
  Filled 2012-05-20: qty 0.4

## 2012-05-20 MED ORDER — BUPROPION HCL ER (XL) 150 MG PO TB24
150.0000 mg | ORAL_TABLET | Freq: Every day | ORAL | Status: DC
  Administered 2012-05-21: 150 mg via ORAL
  Filled 2012-05-20: qty 1

## 2012-05-20 MED ORDER — ONDANSETRON HCL 4 MG/2ML IJ SOLN: 4.0000 mg | Freq: Four times a day (QID) | INTRAMUSCULAR | Status: DC | PRN

## 2012-05-20 MED ORDER — METOPROLOL TARTRATE 25 MG PO TABS
25.0000 mg | ORAL_TABLET | Freq: Two times a day (BID) | ORAL | Status: DC
  Administered 2012-05-20 – 2012-05-21 (×2): 25 mg via ORAL
  Filled 2012-05-20 (×3): qty 1

## 2012-05-20 MED ORDER — OXYCODONE HCL 5 MG PO TABS
30.0000 mg | ORAL_TABLET | ORAL | Status: DC | PRN
  Administered 2012-05-21: 30 mg via ORAL
  Filled 2012-05-20: qty 6

## 2012-05-20 MED ORDER — OXYCODONE HCL ER 20 MG PO T12A
80.0000 mg | EXTENDED_RELEASE_TABLET | Freq: Four times a day (QID) | ORAL | Status: DC
  Administered 2012-05-20 – 2012-05-21 (×4): 80 mg via ORAL
  Filled 2012-05-20 (×4): qty 4

## 2012-05-20 MED ORDER — HYDROMORPHONE HCL PF 1 MG/ML IJ SOLN: 0.2500 mg | INTRAMUSCULAR | Status: DC | PRN

## 2012-05-20 SURGICAL SUPPLY — 24 items
BLADE SURG 15 STRL LF DISP TIS (BLADE) ×2 IMPLANT
BLADE SURG 15 STRL SS (BLADE) ×2
CANISTER SUCTION 2500CC (MISCELLANEOUS) ×2 IMPLANT
CLOTH BEACON ORANGE TIMEOUT ST (SAFETY) ×2 IMPLANT
COUNTER NEEDLE 20 DBL MAG RED (NEEDLE) ×2 IMPLANT
DRSG PAD ABDOMINAL 8X10 ST (GAUZE/BANDAGES/DRESSINGS) IMPLANT
DRSG TEGADERM 2-3/8X2-3/4 SM (GAUZE/BANDAGES/DRESSINGS) ×2 IMPLANT
GLOVE BIO SURGEON STRL SZ7.5 (GLOVE) ×4 IMPLANT
GLOVE BIOGEL M 6.5 STRL (GLOVE) ×2 IMPLANT
GOWN STRL NON-REIN LRG LVL3 (GOWN DISPOSABLE) ×2 IMPLANT
GOWN STRL REIN XL XLG (GOWN DISPOSABLE) ×4 IMPLANT
MARKER SKIN DUAL TIP RULER LAB (MISCELLANEOUS) IMPLANT
NS IRRIG 1000ML POUR BTL (IV SOLUTION) IMPLANT
PACK ICE MAXI GEL EZY WRAP (MISCELLANEOUS) ×2 IMPLANT
PACK MINOR VAGINAL W LONG (CUSTOM PROCEDURE TRAY) ×2 IMPLANT
SPONGE LAP 18X18 X RAY DECT (DISPOSABLE) ×2 IMPLANT
SUT VIC AB 2-0 SH 27 (SUTURE) ×4
SUT VIC AB 2-0 SH 27X BRD (SUTURE) ×4 IMPLANT
SUT VIC AB 3-0 SH 27 (SUTURE) ×4
SUT VIC AB 3-0 SH 27X BRD (SUTURE) ×4 IMPLANT
TOWEL NATURAL 10PK STERILE (DISPOSABLE) ×4 IMPLANT
TOWEL OR NON WOVEN STRL DISP B (DISPOSABLE) ×2 IMPLANT
TRAY FOLEY CATH 14FRSI W/METER (CATHETERS) IMPLANT
WATER STERILE IRR 1500ML POUR (IV SOLUTION) ×2 IMPLANT

## 2012-05-20 NOTE — Anesthesia Postprocedure Evaluation (Signed)
Anesthesia Post Note  Patient: [name]  Procedure(s) Performed: Procedure(s) (LRB): VULVECTOMY (Bilateral)  Anesthesia type: General  Patient location: PACU  Post pain: Pain level controlled  Post assessment: Post-op Vital signs reviewed  Last Vitals:  Filed Vitals:   05/20/12 1800  BP: 114/49  Pulse: 79  Temp: 37 C  Resp: 20    Post vital signs: Reviewed  Level of consciousness: sedated  Complications: No apparent anesthesia complications

## 2012-05-20 NOTE — Transfer of Care (Signed)
Immediate Anesthesia Transfer of Care Note  Patient: [name]  Procedure(s) Performed: Procedure(s) (LRB) with comments: VULVECTOMY (Bilateral) - BILATERAL SIMPLE VULVECTOMIES  Patient Location: PACU  Anesthesia Type:General  Level of Consciousness: awake, alert , oriented and patient cooperative  Airway & Oxygen Therapy: Patient Spontanous Breathing and Patient connected to face mask oxygen  Post-op Assessment: Report given to PACU RN and Post -op Vital signs reviewed and stable  Post vital signs: Reviewed and stable  Complications: No apparent anesthesia complications

## 2012-05-20 NOTE — Anesthesia Preprocedure Evaluation (Addendum)
Anesthesia Evaluation  Patient identified by MRN, date of birth, ID band Patient awake    Reviewed: Allergy & Precautions, H&P , NPO status , Patient's Chart, lab work & pertinent test results  Airway Mallampati: III TM Distance: >3 FB Neck ROM: Full    Dental  (+) Poor Dentition, Missing and Chipped,    Pulmonary shortness of breath and with exertion, COPD COPD inhaler, Current Smoker,    + decreased breath sounds      Cardiovascular hypertension, Pt. on medications + CAD, + Past MI, + Peripheral Vascular Disease and +CHF Rhythm:Regular Rate:Normal     Neuro/Psych Anxiety Depression CVA (probable memory deficits), No Residual Symptoms    GI/Hepatic GERD-  Medicated,  Endo/Other    Renal/GU Renal disease     Musculoskeletal   Abdominal   Peds  Hematology NonHodgkins Lymphoma   Anesthesia Other Findings   Reproductive/Obstetrics                         Anesthesia Physical Anesthesia Plan  ASA: III  Anesthesia Plan: General   Post-op Pain Management:    Induction: Intravenous  Airway Management Planned: Oral ETT  Additional Equipment:   Intra-op Plan:   Post-operative Plan: Extubation in OR  Informed Consent: I have reviewed the patients History and Physical, chart, labs and discussed the procedure including the risks, benefits and alternatives for the proposed anesthesia with the patient or authorized representative who has indicated his/her understanding and acceptance.   Dental advisory given  Plan Discussed with: CRNA  Anesthesia Plan Comments:         Anesthesia Quick Evaluation

## 2012-05-20 NOTE — Interval H&P Note (Signed)
History and Physical Interval Note:  05/20/2012 10:02 AM  [name]  has presented today for surgery, with the diagnosis of VULVAR CANCER  The various methods of treatment have been discussed with the patient and family. After consideration of risks, benefits and other options for treatment, the patient has consented to  Procedure(s) (LRB) with comments: VULVECTOMY (Bilateral) - BILATERAL SIMPLE VULVECTOMIES as a surgical intervention .  The patient's history has been reviewed, patient examined, no change in status, stable for surgery.  I have reviewed the patient's chart and labs.  Questions were answered to the patient's satisfaction.     CLARKE-PEARSON,Lerin Jech L

## 2012-05-20 NOTE — Op Note (Signed)
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 ANESTHESIA: LMA ESTIMATED BLOOD LOSS: Minimal  SURGICAL FINDINGS: Examination under anesthesia revealed 3 distinct vulvar lesions. The first was approximately 1 cm lesion on the left anterior labia minora. The second was a white area of epithelium approximately 1 cm in diameter on the right labia minora. On the right posterior vulva extending across the midline of the perineum was a large hyperpigmented and hypopigmented lesion measuring approximately 5 x 6 cm. Because of the extent of the surgical resection it was elected to admit the patient hospital overnight for pain control and to be certain she would be able to void and manage her wound.  PROCEDURE: Patient was brought to the operating room and after satisfactory attainment of LMA anesthesia was placed in lithotomy position in [place] stirrups. The perineum, vagina and vulva were prepped and the patient was draped. A timeout was taken. The vulva was irrigated with acetic acid. 3 lesions were noted. Attention was first turned to the left anterior vulva where an elliptical incision was created with approximately 1 cm margin all directions. The skin was undermined along with some subcutaneous tissue. Hemostasis achieved with cautery. The incision was closed with interrupted 2-0 Vicryl sutures in the subcutaneous layer and 3-0 Vicryl sutures to reapproximate the skin with a mattress suture.  The right anterior vulvar lesion was next excised. Attention was then turned to the large lesion on the posterior right vulva extending across the midline. An incision with a 1  cm margin was made around the entire gross lesion and the skin and a minimal amount of subcutaneous tissue was excised. Hemostasis achieved with cautery and some figure-of-eight sutures of 2-0 Vicryl. It was determined that this extensive lesion could not be satisfactorily closed. We did reapproximate the skin margins on the right and left side moving from anterior to posterior until skin tension was noted whereupon we discontinued reapproximating the skin edges. Silvadene cream was applied to the exposed subcutaneous tissue. Straight catheterization was performed into the bladder. Patient was awakened from anesthesia and taken to the recovery room in satisfactory condition. Sponge needle and isthmic counts correct x2. The specimen was oriented and submitted to pathology.   De Blanch, M.D

## 2012-05-20 NOTE — H&P (View-Only) (Signed)
Consult Note: Gyn-Onc   [name] 56 y.o. female  Chief Complaint  Patient presents with  . VIN III    New patient      HPI: 56 year old white female seen in consultation at the request of Dr. Duane Lope regarding management of extensive carcinoma in situ of the vulva with 2 focal areas of microscopic invasion. Patient has a long-standing history of vulvar irritation. Biopsies were obtained on October 9 revealing squamous cell carcinoma in situ number of biopsy sites. 2 biopsy sites did show superficially invasive squamous cell carcinoma. On discussion with Dr. Dierdre Searles, the pathologist, I am told that these are less than 1 mm of invasion. The patient denies any bleeding or any other gynecologic problems.  Review of Systems:10 point review of systems is negative as noted above.   Vitals: Blood pressure 92/52, pulse 68, temperature 98 F (36.7 C), resp. rate 16, height 5' 5.39" (1.661 m), weight 144 lb 11.2 oz (65.635 kg).  Physical Exam: General : The patient is a healthy woman in no acute distress.  HEENT: normocephalic, extraoccular movements normal; neck is supple without thyromegally  Lynphnodes: Supraclavicular and inguinal nodes not enlarged  Abdomen: Soft, non-tender, no ascites, no organomegally, no masses, no hernias  Pelvic:  EGBUS: Normal female , there are a number of vulvar lesions. At 7:00 in the posterior right vulva is approximately 4 cm area of hyperpigmented raised epithelium. Superior to this on the labia minora is a small area of white epithelium. On the left vulva in the labia majora is an incision line which is healing well and further medially along the labia minora is another area of raised white epithelium. Vagina: Normal, no lesions  Urethra and Bladder: Normal, non-tender  Cervix: Surgically absent  Uterus: Surgically absent  Bi-manual examination: Non-tender; no adenxal masses or nodularity  Rectal: normal sphincter tone, no masses, no blood  Lower  extremities: No edema or varicosities. Normal range of motion    Assessment/Plan: Carcinoma in situ of the vulva which is multifocal. There are 2 areas on previous biopsies that have superficial invasion less than 1 mm. I had a lengthy discussion with the patient her brother and sister about management options. Essentially a wide local excision/superficial vulvectomy would be recommended. They understand that a deeper invasion may be found on final pathology review of the entire specimen. They wished to go ahead with surgery and we'll schedule that upon my next surgical date in [place].  In the interim the patient is strongly encouraged to discontinue smoking as it contributes to her vulvar problem. She has a number of other medical problems that need to be addressed in the preoperative preparation.  No Known Allergies  Past Medical History  Diagnosis Date  . Coronary artery disease     status post stenting of the aortic and iliac vessels by Dr. Allyson Sabal. Repeat aortic stenting 2010  . Left ventricular dysfunction     hx of with ejection fraction of 35% range.  . Carotid artery disease     hx of carotid cerebrovascular disease with 0-39% bilateral internal carotid artery setenosis   . Non Hodgkin's lymphoma   . Chronic back pain     status post lumbar surgery  . Hyperlipidemia   . Hypertension   . COPD (chronic obstructive pulmonary disease)   . Shortness of breath   . Cancer     hx of non hogdin lymphoma  . Stroke   . CHF (congestive heart failure)   . Blood  transfusion   . Arthritis   . Myocardial infarction   . Peripheral vascular disease   . GERD (gastroesophageal reflux disease)   . Anemia     Past Surgical History  Procedure Date  . Lumar surg   . Back surgery     x 2  . Lung biopsy   . Cardiac catheterization   . Abdominal aortogram 08/08/2011  . Abdominal hysterectomy     partial  . Portacath placement     pt says its for chemo only and its an old port   .  Dilation and curettage of uterus   . Vulva /perineum biopsy 04/09/2012    Procedure: VULVAR BIOPSY;  Surgeon: [name], MD;  Location: [facility];  Service: Gynecology;  Laterality: N/A;    Current Outpatient Prescriptions  Medication Sig Dispense Refill  . albuterol (VENTOLIN HFA) 108 (90 BASE) MCG/ACT inhaler Inhale 2 puffs into the lungs every 6 (six) hours as needed. For asthma/wheezing      . ALPRAZolam (XANAX) 1 MG tablet Take 1 mg by mouth 3 (three) times daily.       [name] amitriptyline (ELAVIL) 50 MG tablet Take 50 mg by mouth at bedtime.      [name] aspirin 81 MG EC tablet Take 81 mg by mouth daily.        [name] buPROPion (WELLBUTRIN XL) 150 MG 24 hr tablet Take 150 mg by mouth daily.      . cholecalciferol (VITAMIN D) 1000 UNITS tablet Take 2,000 Units by mouth daily.       . clopidogrel (PLAVIX) 75 MG tablet Take 1 tablet (75 mg total) by mouth daily.  30 tablet  11  . cycloSPORINE (RESTASIS) 0.05 % ophthalmic emulsion Place 1 drop into both eyes 2 (two) times daily.      . diazepam (VALIUM) 5 MG tablet Take 5 mg by mouth every 6 (six) hours as needed. For muscle spasms      . LOPRESSOR 50 MG tablet TAKE (1/2) TABLET BY MOUTH TWICE DAILY.  30 tablet  6  . NEXIUM 40 MG capsule Take 40 mg by mouth Twice daily.      [name] NITROSTAT 0.4 MG SL tablet DISSOLVE 1 TABLET UNDER TONGUE EVERY 5 MINUTES UP TO 15 MIN FOR CHESTPAIN. IF NO RELIEF CALL 911.  25 each  3  . oxyCODONE (OXYCONTIN) 80 MG 12 hr tablet Take 80 mg by mouth 4 (four) times daily.      [name] oxycodone (ROXICODONE) 30 MG immediate release tablet Take 30 mg by mouth every 4 (four) hours as needed. For breakthrough pain      . rosuvastatin (CRESTOR) 40 MG tablet Take 40 mg by mouth daily.        [name] sulfamethoxazole-trimethoprim (BACTRIM DS) 800-160 MG per tablet Take 1 tablet by mouth 2 (two) times a week. Takes every Saturday and Sunday      . vitamin B-12 (CYANOCOBALAMIN) 1000 MCG tablet Take 1,000 mcg by mouth daily.      [name] ketorolac (TORADOL)  10 MG tablet Take 1 tablet (10 mg total) by mouth every 8 (eight) hours as needed for pain.  15 tablet  0  . ondansetron (ZOFRAN) 8 MG tablet Take 1 tablet (8 mg total) by mouth every 8 (eight) hours as needed for nausea.  10 tablet  0    History   Social History  . Marital Status: Divorced    Spouse Name: N/A    Number of Children: 2  .  Years of Education: N/A   Occupational History  . retired    Social History Main Topics  . Smoking status: Current Every Day Smoker -- 1.0 packs/day for 20 years    Types: Cigarettes  . Smokeless tobacco: Never Used  . Alcohol Use: No  . Drug Use: No  . Sexually Active: No   Other Topics Concern  . Not on file   Social History Narrative  . No narrative on file    Family History  Problem Relation Age of Onset  . Coronary artery disease      positive for vascular and cardiac disease  . Heart disease Mother   . Lung cancer Mother   . Heart attack Mother   . Heart failure Mother   . Heart disease Father   . Kidney cancer Father   . Heart attack Father   . Heart failure Father   . Diabetes Sister   . Aortic aneurysm Sister   . Heart attack Brother   . Asthma Son   . Sleep apnea Son   . Endometriosis Daughter       Jeannette Corpus, MD 05/02/2012, 3:36 PM

## 2012-05-21 LAB — BASIC METABOLIC PANEL
Chloride: 104 mEq/L (ref 96–112)
Creatinine, Ser: 1.07 mg/dL (ref 0.50–1.10)
GFR calc Af Amer: 67 mL/min — ABNORMAL LOW (ref >90)
Potassium: 4.5 mEq/L (ref 3.5–5.1)

## 2012-05-21 NOTE — Discharge Summary (Signed)
Physician Discharge Summary  Patient ID: [name] MRN: [id number] DOB/AGE: [birth year] 56 y.o.  Admit date: 05/20/2012 Discharge date: 05/21/2012  Admission Diagnoses: Vulvar cancer  Discharge Diagnoses:  Principal Problem:  *Vulvar cancer  Discharged Condition:  The patient is in good condition and stable for discharge.  Hospital Course:  On 05/20/2012, the patient underwent the following: Procedure(s):  VULVECTOMY.  The postoperative course was uneventful.  She was discharged to home on postoperative day 1 tolerating a regular diet.  Consults: None  Significant Diagnostic Studies: None  Treatments: surgery: See above  Discharge Exam: Blood pressure 133/77, pulse 80, temperature 97.6 F (36.4 C), temperature source Oral, resp. rate 18, height 5\' 4"  (1.626 m), weight 149 lb 8.7 oz (67.832 kg), SpO2 100.00%. General appearance: alert, cooperative and no distress Resp: Mild wheezing noted anteriorly, cleared with coughing Cardio: regular rate and rhythm, S1, S2 normal, no murmur, click, rub or gallop GI: soft, non-tender; bowel sounds normal; no masses,  no organomegaly Extremities: extremities normal, atraumatic, no cyanosis or edema Incision/Wound: Sutures to the left vulva intact, right lower vulva incision open.  Vulva clean and dry at this time.  Disposition: 01-Home or Self Care  Discharge Orders    Future Appointments: Provider: Department: Dept Phone: Center:   07/08/2012 1:45 PM Herby Abraham, MD [facility] [place]) [phone] [id number]     Future Orders Please Complete By Expires   Diet - low sodium heart healthy      Increase activity slowly      Driving Restrictions      Comments:   No driving for 1-2 weeks.  Do not take narcotics and drive.   Lifting restrictions      Comments:   No lifting greater than 10 lbs.   Sexual Activity Restrictions      Comments:   No sexual activity, nothing in the vagina, for 8 weeks.   Call MD  for:  temperature >100.4      Call MD for:  persistant nausea and vomiting      Call MD for:  severe uncontrolled pain      Call MD for:  redness, tenderness, or signs of infection (pain, swelling, redness, odor or green/yellow discharge around incision site)      Call MD for:  difficulty breathing, headache or visual disturbances      Call MD for:  hives      Call MD for:  persistant dizziness or light-headedness      Call MD for:  extreme fatigue      Discharge instructions      Comments:   Use caution when taking chronic pain medications and avoid oversedation.       Medication List     As of 05/21/2012 10:36 AM    TAKE these medications         ALPRAZolam 1 MG tablet   Commonly known as: XANAX   Take 1 mg by mouth 3 (three) times daily.      amitriptyline 50 MG tablet   Commonly known as: ELAVIL   Take 50 mg by mouth at bedtime.      aspirin 81 MG EC tablet   Take 81 mg by mouth daily.      buPROPion 150 MG 24 hr tablet   Commonly known as: WELLBUTRIN XL   Take 150 mg by mouth daily.      clopidogrel 75 MG tablet   Commonly known as: PLAVIX   Take 1 tablet (  75 mg total) by mouth daily.      cycloSPORINE 0.05 % ophthalmic emulsion   Commonly known as: RESTASIS   Place 1 drop into both eyes 2 (two) times daily.      diazepam 5 MG tablet   Commonly known as: VALIUM   Take 5 mg by mouth every 6 (six) hours as needed. For muscle spasms      LOPRESSOR 50 MG tablet   Generic drug: metoprolol   TAKE (1/2) TABLET BY MOUTH TWICE DAILY.      mupirocin ointment 2 %   Commonly known as: BACTROBAN   Apply 1 application topically daily. As needed for leg infection.      NEXIUM 40 MG capsule   Generic drug: esomeprazole   Take 40 mg by mouth Twice daily.      NITROSTAT 0.4 MG SL tablet   Generic drug: nitroGLYCERIN   DISSOLVE 1 TABLET UNDER TONGUE EVERY 5 MINUTES UP TO 15 MIN FOR CHESTPAIN. IF NO RELIEF CALL 911.      rosuvastatin 40 MG tablet   Commonly known as:  CRESTOR   Take 40 mg by mouth daily.      ROXICODONE 30 MG immediate release tablet   Generic drug: oxycodone   Take 30 mg by mouth every 4 (four) hours as needed. For breakthrough pain      oxyCODONE 80 MG 12 hr tablet   Commonly known as: OXYCONTIN   Take 80 mg by mouth 4 (four) times daily.      silver sulfADIAZINE 1 % cream   Commonly known as: SILVADENE   Apply 1 application topically 2 (two) times daily. Apply to vulva surgical site.      sulfamethoxazole-trimethoprim 800-160 MG per tablet   Commonly known as: BACTRIM DS   Take 1 tablet by mouth See admin instructions. Takes 1 tablet twice a day on Saturday and Sunday only.      VENTOLIN HFA 108 (90 BASE) MCG/ACT inhaler   Generic drug: albuterol   Inhale 2 puffs into the lungs every 6 (six) hours as needed. For asthma/wheezing      vitamin B-12 1000 MCG tablet   Commonly known as: CYANOCOBALAMIN   Take 1,000 mcg by mouth daily.        Signed: Rielly Corlett DEAL 05/21/2012, 10:36 AM

## 2012-05-21 NOTE — Progress Notes (Signed)
1 Day Post-Op Procedure(s) (LRB): VULVECTOMY (Bilateral)  Subjective: Patient reports no complaints.  Tolerating PO intake.  Reporting adequate pain relief.  Verbalizing understanding of peri-care and silvadene cream use.  Denies nausea, vomiting, flatus, bowel movement, chest pain, and dyspnea.  When questioned about current wheezing, stating that she experiences intermittent episodes of mild wheezing.   Objective: Vital signs in last 24 hours: Temp:  [97.6 F (36.4 C)-98.6 F (37 C)] 97.6 F (36.4 C) (11/20 0529) Pulse Rate:  [56-79] 56  (11/20 0529) Resp:  [10-20] 18  (11/20 0529) BP: (108-144)/(49-85) 144/79 mmHg (11/20 0529) SpO2:  [96 %-100 %] 100 % (11/20 0529) Weight:  [149 lb 8.7 oz (67.832 kg)] 149 lb 8.7 oz (67.832 kg) (11/19 1244)    Intake/Output from previous day: 11/19 0701 - 11/20 0700 In: 3952.5 [P.O.:240; I.V.:3712.5] Out: 1725 [Urine:1725]  Physical Examination: General: alert, cooperative and no distress Resp: diminished in bilateral bases, mild wheezes noted anteriorly.  Cardio: regular rate and rhythm, S1, S2 normal, no murmur, click, rub or gallop GI: soft, non-tender; bowel sounds normal; no masses,  no organomegaly Extremities: extremities normal, atraumatic, no cyanosis or edema Vulva: Sutures intact, right vulvar incision open with minimal sanguinous drainage noted, area clean and dry.     Labs:   BUN/Cr/glu/ALT/AST/amyl/lip:  13/1.07/--/--/--/--/-- (11/20 9811)  Assessment: 56 y.o. s/p Procedure(s): VULVECTOMY: stable Pain:  Pain is well-controlled on oral medications.  GI:  Tolerating po: Yes     Prophylaxis: intermittent pneumatic compression boots.  Plan: Encourage IS use, deep breathing, and coughing Encourage ambulation Begin discharge teaching   LOS: 1 day    CROSS, MELISSA DEAL 05/21/2012, 9:05 AM

## 2012-05-23 ENCOUNTER — Telehealth: Payer: Self-pay | Admitting: *Deleted

## 2012-05-23 ENCOUNTER — Encounter (HOSPITAL_COMMUNITY): Payer: Self-pay

## 2012-05-23 ENCOUNTER — Encounter (HOSPITAL_COMMUNITY): Payer: Self-pay | Admitting: Gynecology

## 2012-05-23 ENCOUNTER — Encounter: Payer: Self-pay | Admitting: *Deleted

## 2012-05-23 NOTE — Telephone Encounter (Signed)
Patient notified of Path results.   

## 2012-05-23 NOTE — Progress Notes (Signed)
Pt in for RN wound check following simple vulvectomy 05/20/12.  The R and L anterior excision sites are clean and intact with sutures un place.  The R posterior excision site was left open.  The site is without sx if infection and appears as would be expected 3 days post-op.  [name] afebrile.  Her R buttocks however is erythematous, warm to the touch and without induration. Pt is reassured as regards the wound status and home care once again reviewed.  Case d/w Dr Stanford Breed and rx levaquin 750 one po qd x 10 days # 10 and Norco 5/325 one to two po q6 hrs # 60 no refill called to [facility] pharmacy @ [phone].  RTC 06/03/12 @ 2:30p. Call for drop in appt 05/26/12 if needed. Patienr is again encouraged to stop smoking.

## 2012-06-03 ENCOUNTER — Ambulatory Visit: Payer: Medicare Other | Attending: Gynecology | Admitting: Gynecology

## 2012-06-03 ENCOUNTER — Encounter: Payer: Self-pay | Admitting: Gynecology

## 2012-06-03 VITALS — BP 102/64 | HR 66 | Temp 98.8°F | Resp 22 | Ht 65.39 in | Wt 137.1 lb

## 2012-06-03 DIAGNOSIS — J449 Chronic obstructive pulmonary disease, unspecified: Secondary | ICD-10-CM | POA: Insufficient documentation

## 2012-06-03 DIAGNOSIS — Z8249 Family history of ischemic heart disease and other diseases of the circulatory system: Secondary | ICD-10-CM | POA: Insufficient documentation

## 2012-06-03 DIAGNOSIS — Z8051 Family history of malignant neoplasm of kidney: Secondary | ICD-10-CM | POA: Insufficient documentation

## 2012-06-03 DIAGNOSIS — Z8673 Personal history of transient ischemic attack (TIA), and cerebral infarction without residual deficits: Secondary | ICD-10-CM | POA: Insufficient documentation

## 2012-06-03 DIAGNOSIS — I779 Disorder of arteries and arterioles, unspecified: Secondary | ICD-10-CM | POA: Insufficient documentation

## 2012-06-03 DIAGNOSIS — Z801 Family history of malignant neoplasm of trachea, bronchus and lung: Secondary | ICD-10-CM | POA: Insufficient documentation

## 2012-06-03 DIAGNOSIS — Z825 Family history of asthma and other chronic lower respiratory diseases: Secondary | ICD-10-CM | POA: Insufficient documentation

## 2012-06-03 DIAGNOSIS — C519 Malignant neoplasm of vulva, unspecified: Secondary | ICD-10-CM

## 2012-06-03 DIAGNOSIS — F17200 Nicotine dependence, unspecified, uncomplicated: Secondary | ICD-10-CM | POA: Insufficient documentation

## 2012-06-03 DIAGNOSIS — M549 Dorsalgia, unspecified: Secondary | ICD-10-CM | POA: Insufficient documentation

## 2012-06-03 DIAGNOSIS — I252 Old myocardial infarction: Secondary | ICD-10-CM | POA: Insufficient documentation

## 2012-06-03 DIAGNOSIS — I509 Heart failure, unspecified: Secondary | ICD-10-CM | POA: Insufficient documentation

## 2012-06-03 DIAGNOSIS — I2510 Atherosclerotic heart disease of native coronary artery without angina pectoris: Secondary | ICD-10-CM | POA: Insufficient documentation

## 2012-06-03 DIAGNOSIS — G8929 Other chronic pain: Secondary | ICD-10-CM | POA: Insufficient documentation

## 2012-06-03 DIAGNOSIS — F3289 Other specified depressive episodes: Secondary | ICD-10-CM | POA: Insufficient documentation

## 2012-06-03 DIAGNOSIS — F411 Generalized anxiety disorder: Secondary | ICD-10-CM | POA: Insufficient documentation

## 2012-06-03 DIAGNOSIS — K219 Gastro-esophageal reflux disease without esophagitis: Secondary | ICD-10-CM | POA: Insufficient documentation

## 2012-06-03 DIAGNOSIS — J4489 Other specified chronic obstructive pulmonary disease: Secondary | ICD-10-CM | POA: Insufficient documentation

## 2012-06-03 DIAGNOSIS — F329 Major depressive disorder, single episode, unspecified: Secondary | ICD-10-CM | POA: Insufficient documentation

## 2012-06-03 DIAGNOSIS — Z87898 Personal history of other specified conditions: Secondary | ICD-10-CM | POA: Insufficient documentation

## 2012-06-03 DIAGNOSIS — D071 Carcinoma in situ of vulva: Secondary | ICD-10-CM | POA: Insufficient documentation

## 2012-06-03 DIAGNOSIS — I10 Essential (primary) hypertension: Secondary | ICD-10-CM | POA: Insufficient documentation

## 2012-06-03 DIAGNOSIS — Z833 Family history of diabetes mellitus: Secondary | ICD-10-CM | POA: Insufficient documentation

## 2012-06-03 DIAGNOSIS — Z8489 Family history of other specified conditions: Secondary | ICD-10-CM | POA: Insufficient documentation

## 2012-06-03 DIAGNOSIS — M810 Age-related osteoporosis without current pathological fracture: Secondary | ICD-10-CM | POA: Insufficient documentation

## 2012-06-03 NOTE — Progress Notes (Signed)
Consult Note: Gyn-Onc   [name] 56 y.o. female  Chief Complaint  Patient presents with  . Vulvar Cancer    Follow up post-op    Interval History: The patient returns today for postoperative followup. She continues to use sitz baths twice a day and apply Silvadene cream to the open area of her vulvar wound. She sees be managing this quite well. She denies any fever or chills or any significant vulvar pain.  HPI: The patient underwent partial bilateral vulvectomy 05/20/2012. 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 In at least 3 areas microscopic invasive squamous cell carcinoma was identified. All of which was less than 1 mm of invasion. A posterior right vulvar lesion extending across the perineum and was so extensive we were unable to close it primarily and it was left open to granulate and epithelialize.  Review of Systems:10 point review of systems is negative as noted above.   Vitals: Blood pressure 102/64, pulse 66, temperature 98.8 F (37.1 C), temperature source Oral, resp. rate 22, height 5' 5.39" (1.661 m), weight 137 lb 1.6 oz (62.188 kg).  Physical Exam: General : The patient is a healthy woman in no acute distress.  HEENT: normocephalic, extraoccular movements normal; neck is supple without thyromegally  Lynphnodes: Supraclavicular and inguinal nodes not enlarged  Abdomen: Soft, non-tender, no ascites, no organomegally, no masses, no hernias  Pelvic:  EGBUS: Normal female incisions on the anterior vulva on the right and left are healing sutures are still in place. The open area on the posterior right vulva and across the perineum is granulating well.    Lower extremities: No edema or varicosities. Normal range of motion she does have some swelling in the left knee.    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 Patient and negative surgical margins. We will continue to work to have the open wound he'll and then follow her  expectantly. She'll continue her current regimen of sitz baths and Silvadene cream. She returned to see me in early January or as needed.  No Known Allergies  Past Medical History  Diagnosis Date  . Coronary artery disease     status post stenting of the aortic and iliac vessels by Dr. Allyson Sabal. Repeat aortic stenting 2010  . Left ventricular dysfunction     hx of with ejection fraction of 35% range.  . Carotid artery disease     hx of carotid cerebrovascular disease with 0-39% bilateral internal carotid artery setenosis   . Non Hodgkin's lymphoma   . Chronic back pain     status post lumbar surgery  . Hyperlipidemia   . Hypertension   . COPD (chronic obstructive pulmonary disease)   . Shortness of breath   . Cancer     hx of non hogdin lymphoma  . Stroke   . CHF (congestive heart failure)   . Blood transfusion   . Arthritis   . Myocardial infarction   . Peripheral vascular disease   . GERD (gastroesophageal reflux disease)   . Anemia   . Osteoporosis   . Depression   . Anxiety   . Other malignant lymphomas, unspecified site, extranodal and solid organ sites 2004    Back    Past Surgical History  Procedure Date  . Lumar surg   . Back surgery     x 2  . Lung biopsy   . Cardiac catheterization   . Abdominal aortogram 08/08/2011  . Abdominal hysterectomy  partial  . Portacath placement     pt says its for chemo only and its an old port   . Dilation and curettage of uterus   . Vulva /perineum biopsy 04/09/2012    Procedure: VULVAR BIOPSY;  Surgeon: [name], MD;  Location: [facility];  Service: Gynecology;  Laterality: N/A;  . Vulvectomy 05/20/2012    Procedure: VULVECTOMY;  Surgeon: Jeannette Corpus, MD;  Location: [facility];  Service: Gynecology;  Laterality: Bilateral;  BILATERAL SIMPLE VULVECTOMIES    Current Outpatient Prescriptions  Medication Sig Dispense Refill  . albuterol (VENTOLIN HFA) 108 (90 BASE) MCG/ACT inhaler Inhale 2 puffs into the lungs every  6 (six) hours as needed. For asthma/wheezing      . ALPRAZolam (XANAX) 1 MG tablet Take 1 mg by mouth 3 (three) times daily.       [name] amitriptyline (ELAVIL) 50 MG tablet Take 50 mg by mouth at bedtime.      [name] aspirin 81 MG EC tablet Take 81 mg by mouth daily.        [name] buPROPion (WELLBUTRIN XL) 150 MG 24 hr tablet Take 150 mg by mouth daily.      . cycloSPORINE (RESTASIS) 0.05 % ophthalmic emulsion Place 1 drop into both eyes 2 (two) times daily.      . diazepam (VALIUM) 5 MG tablet Take 5 mg by mouth every 6 (six) hours as needed. For muscle spasms      . LOPRESSOR 50 MG tablet TAKE (1/2) TABLET BY MOUTH TWICE DAILY.  30 tablet  6  . mupirocin ointment (BACTROBAN) 2 % Apply 1 application topically daily. As needed for leg infection.      [name] NEXIUM 40 MG capsule Take 40 mg by mouth Twice daily.      [name] NITROSTAT 0.4 MG SL tablet DISSOLVE 1 TABLET UNDER TONGUE EVERY 5 MINUTES UP TO 15 MIN FOR CHESTPAIN. IF NO RELIEF CALL 911.  25 each  3  . oxyCODONE (OXYCONTIN) 80 MG 12 hr tablet Take 80 mg by mouth 4 (four) times daily.      [name] oxycodone (ROXICODONE) 30 MG immediate release tablet Take 30 mg by mouth every 4 (four) hours as needed. For breakthrough pain      . rosuvastatin (CRESTOR) 40 MG tablet Take 40 mg by mouth daily.        . silver sulfADIAZINE (SILVADENE) 1 % cream Apply 1 application topically 2 (two) times daily. Apply to vulva surgical site.      . sulfamethoxazole-trimethoprim (BACTRIM DS) 800-160 MG per tablet Take 1 tablet by mouth See admin instructions. Takes 1 tablet twice a day on Saturday and Sunday only.      . vitamin B-12 (CYANOCOBALAMIN) 1000 MCG tablet Take 1,000 mcg by mouth daily.      . clopidogrel (PLAVIX) 75 MG tablet Take 1 tablet (75 mg total) by mouth daily.  30 tablet  11    History   Social History  . Marital Status: Divorced    Spouse Name: N/A    Number of Children: 2  . Years of Education: N/A   Occupational History  . retired    Social History Main  Topics  . Smoking status: Current Every Day Smoker -- 1.0 packs/day for 20 years    Types: Cigarettes  . Smokeless tobacco: Never Used  . Alcohol Use: No  . Drug Use: No  . Sexually Active: No   Other Topics Concern  . Not on file  Social History Narrative  . No narrative on file    Family History  Problem Relation Age of Onset  . Coronary artery disease      positive for vascular and cardiac disease  . Heart disease Mother   . Lung cancer Mother   . Heart attack Mother   . Heart failure Mother   . Heart disease Father   . Kidney cancer Father   . Heart attack Father   . Heart failure Father   . Diabetes Sister   . Aortic aneurysm Sister   . Heart attack Brother   . Asthma Son   . Sleep apnea Son   . Endometriosis Daughter       Jeannette Corpus, MD 06/03/2012, 3:59 PM

## 2012-06-03 NOTE — Patient Instructions (Signed)
Continue the current program of sitz baths twice a day and applying Silvadene cream. Please return to keep her appointment scheduled for early January.

## 2012-06-18 ENCOUNTER — Ambulatory Visit: Payer: Medicare Other | Attending: Gynecologic Oncology | Admitting: Gynecologic Oncology

## 2012-06-18 ENCOUNTER — Ambulatory Visit: Payer: Medicare Other | Admitting: Gynecologic Oncology

## 2012-06-18 ENCOUNTER — Encounter: Payer: Self-pay | Admitting: Gynecologic Oncology

## 2012-06-18 VITALS — BP 100/58 | HR 60 | Temp 97.8°F | Resp 20 | Ht 65.39 in | Wt 142.0 lb

## 2012-06-18 DIAGNOSIS — I739 Peripheral vascular disease, unspecified: Secondary | ICD-10-CM | POA: Insufficient documentation

## 2012-06-18 DIAGNOSIS — I252 Old myocardial infarction: Secondary | ICD-10-CM | POA: Insufficient documentation

## 2012-06-18 DIAGNOSIS — J4489 Other specified chronic obstructive pulmonary disease: Secondary | ICD-10-CM | POA: Insufficient documentation

## 2012-06-18 DIAGNOSIS — M810 Age-related osteoporosis without current pathological fracture: Secondary | ICD-10-CM | POA: Insufficient documentation

## 2012-06-18 DIAGNOSIS — I10 Essential (primary) hypertension: Secondary | ICD-10-CM | POA: Insufficient documentation

## 2012-06-18 DIAGNOSIS — J449 Chronic obstructive pulmonary disease, unspecified: Secondary | ICD-10-CM | POA: Insufficient documentation

## 2012-06-18 DIAGNOSIS — E785 Hyperlipidemia, unspecified: Secondary | ICD-10-CM | POA: Insufficient documentation

## 2012-06-18 DIAGNOSIS — C519 Malignant neoplasm of vulva, unspecified: Secondary | ICD-10-CM | POA: Insufficient documentation

## 2012-06-18 DIAGNOSIS — Z79899 Other long term (current) drug therapy: Secondary | ICD-10-CM | POA: Insufficient documentation

## 2012-06-18 DIAGNOSIS — Z87898 Personal history of other specified conditions: Secondary | ICD-10-CM | POA: Insufficient documentation

## 2012-06-18 DIAGNOSIS — K219 Gastro-esophageal reflux disease without esophagitis: Secondary | ICD-10-CM | POA: Insufficient documentation

## 2012-06-18 DIAGNOSIS — Z8673 Personal history of transient ischemic attack (TIA), and cerebral infarction without residual deficits: Secondary | ICD-10-CM | POA: Insufficient documentation

## 2012-06-18 DIAGNOSIS — I2510 Atherosclerotic heart disease of native coronary artery without angina pectoris: Secondary | ICD-10-CM | POA: Insufficient documentation

## 2012-06-18 DIAGNOSIS — F17200 Nicotine dependence, unspecified, uncomplicated: Secondary | ICD-10-CM | POA: Insufficient documentation

## 2012-06-18 DIAGNOSIS — Z09 Encounter for follow-up examination after completed treatment for conditions other than malignant neoplasm: Secondary | ICD-10-CM | POA: Insufficient documentation

## 2012-06-18 NOTE — Progress Notes (Signed)
Follow Up Note: Gyn-Onc  [name] 56 y.o. female  CC:  Chief Complaint  Patient presents with  . 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  On May 20, 2012, she underwent a  partial bilateral vulvectomy.  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  Microscopic invasive squamous cell carcinoma was identified in at least three areas with all areas less than 1 mm of invasion.  A right posterior vulvar lesion extended across the perineum and was so extensive that the incision was left open to granulate and epithelialize.  Interval History:  She returns today for continued post-operative followup. She reports the continued use of sitz baths twice a day along with Silvadene cream applications to the open area of her vulvar wound.  She reports that she is "doing well" with caring for the wound.  She denies fever, chills, or any significant vulvar pain.  She reports complaints of left knee pain and swelling along with right foot soreness related to neuropathy.    Review of Systems  Constitutional: Feels well.  Cardiovascular: No chest pain, shortness of breath, or edema.  Pulmonary: No cough or wheeze.  Gastrointestinal: No nausea, vomiting, or diarrhea.   Genitourinary: No frequency, urgency, or dysuria. No vaginal bleeding or discharge.  Musculoskeletal: Left knee pain and swelling along with right foot soreness related to neuropathy.  Neurologic: No weakness, numbness, or change in gait.  Psychology: No depression, anxiety, or insomnia  Current Meds:  Outpatient Encounter Prescriptions as of 06/18/2012  Medication Sig Dispense Refill  . albuterol (VENTOLIN HFA) 108 (90 BASE) MCG/ACT inhaler Inhale 2 puffs into the lungs every 6 (six) hours as needed. For asthma/wheezing      . ALPRAZolam (XANAX) 1  MG tablet Take 1 mg by mouth 3 (three) times daily.       [name] amitriptyline (ELAVIL) 50 MG tablet Take 50 mg by mouth at bedtime.      [name] aspirin 81 MG EC tablet Take 81 mg by mouth daily.        [name] buPROPion (WELLBUTRIN XL) 150 MG 24 hr tablet Take 150 mg by mouth daily.      . clopidogrel (PLAVIX) 75 MG tablet Take 1 tablet (75 mg total) by mouth daily.  30 tablet  11  . cycloSPORINE (RESTASIS) 0.05 % ophthalmic emulsion Place 1 drop into both eyes 2 (two) times daily.      . diazepam (VALIUM) 5 MG tablet Take 5 mg by mouth every 6 (six) hours as needed. For muscle spasms      . LOPRESSOR 50 MG tablet TAKE (1/2) TABLET BY MOUTH TWICE DAILY.  30 tablet  6  . mupirocin ointment (BACTROBAN) 2 % Apply 1 application topically daily. As needed for leg infection.      [name] NEXIUM 40 MG capsule Take 40 mg by mouth Twice daily.      [name] NITROSTAT 0.4 MG SL tablet DISSOLVE 1 TABLET UNDER TONGUE EVERY 5 MINUTES UP TO 15 MIN FOR CHESTPAIN. IF NO RELIEF CALL 911.  25 each  3  . oxyCODONE (OXYCONTIN) 80 MG 12 hr tablet Take 80 mg by mouth 4 (four) times daily.      [name] oxycodone (ROXICODONE) 30 MG immediate release tablet Take 30 mg by mouth every 4 (four) hours as needed. For breakthrough pain      .  rosuvastatin (CRESTOR) 40 MG tablet Take 40 mg by mouth daily.        . silver sulfADIAZINE (SILVADENE) 1 % cream Apply 1 application topically 2 (two) times daily. Apply to vulva surgical site.      . sulfamethoxazole-trimethoprim (BACTRIM DS) 800-160 MG per tablet Take 1 tablet by mouth See admin instructions. Takes 1 tablet twice a day on Saturday and Sunday only.      . vitamin B-12 (CYANOCOBALAMIN) 1000 MCG tablet Take 1,000 mcg by mouth daily.        Allergy: No Known Allergies  Social Hx:   History   Social History  . Marital Status: Divorced    Spouse Name: N/A    Number of Children: 2  . Years of Education: N/A   Occupational History  . retired    Social History Main Topics  . Smoking status:  Current Every Day Smoker -- 1.0 packs/day for 20 years    Types: Cigarettes  . Smokeless tobacco: Never Used  . Alcohol Use: No  . Drug Use: No  . Sexually Active: No   Other Topics Concern  . Not on file   Social History Narrative  . No narrative on file    Past Surgical Hx:  Past Surgical History  Procedure Date  . Lumar surg   . Back surgery     x 2  . Lung biopsy   . Cardiac catheterization   . Abdominal aortogram 08/08/2011  . Abdominal hysterectomy     partial  . Portacath placement     pt says its for chemo only and its an old port   . Dilation and curettage of uterus   . Vulva /perineum biopsy 04/09/2012    Procedure: VULVAR BIOPSY;  Surgeon: [name], MD;  Location: [facility];  Service: Gynecology;  Laterality: N/A;  . Vulvectomy 05/20/2012    Procedure: VULVECTOMY;  Surgeon: Jeannette Corpus, MD;  Location: [facility];  Service: Gynecology;  Laterality: Bilateral;  BILATERAL SIMPLE VULVECTOMIES    Past Medical Hx:  Past Medical History  Diagnosis Date  . Coronary artery disease     status post stenting of the aortic and iliac vessels by Dr. Allyson Sabal. Repeat aortic stenting 2010  . Left ventricular dysfunction     hx of with ejection fraction of 35% range.  . Carotid artery disease     hx of carotid cerebrovascular disease with 0-39% bilateral internal carotid artery setenosis   . Non Hodgkin's lymphoma   . Chronic back pain     status post lumbar surgery  . Hyperlipidemia   . Hypertension   . COPD (chronic obstructive pulmonary disease)   . Shortness of breath   . Cancer     hx of non hogdin lymphoma  . Stroke   . CHF (congestive heart failure)   . Blood transfusion   . Arthritis   . Myocardial infarction   . Peripheral vascular disease   . GERD (gastroesophageal reflux disease)   . Anemia   . Osteoporosis   . Depression   . Anxiety   . Other malignant lymphomas, unspecified site, extranodal and solid organ sites 2004    Back    Family Hx:   Family History  Problem Relation Age of Onset  . Coronary artery disease      positive for vascular and cardiac disease  . Heart disease Mother   . Lung cancer Mother   . Heart attack Mother   . Heart failure  Mother   . Heart disease Father   . Kidney cancer Father   . Heart attack Father   . Heart failure Father   . Diabetes Sister   . Aortic aneurysm Sister   . Heart attack Brother   . Asthma Son   . Sleep apnea Son   . Endometriosis Daughter     Vitals:  Blood pressure 100/58, pulse 60, temperature 97.8 F (36.6 C), temperature source Oral, resp. rate 20, height 5' 5.39" (1.661 m), weight 142 lb (64.411 kg).  Physical Exam:  General: Well developed, well nourished female in no acute distress. Alert and oriented x 3.  Cardiovascular: Regular rate and rhythm. S1 and S2 normal.  Lungs: Clear to auscultation bilaterally. No wheezes/crackles/rhonchi noted.  Abdomen: Soft, non-tender.  No masses or hernias.  Pelvic:    EGBUS: Incisions on the anterior right and left vulva healing.  One visible, exposed suture removed without difficulty. The open area on the posterior right vulva and across the perineum is granulating well.  Lower extremities: Mild edema noted in the left knee.  Tenderness with palpation of the anterior portion on the right foot.   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  On May 20, 2012, she underwent a partial bilateral vulvectomy with negative surgical margins.  [name] to continue her current regimen of sitz baths and Silvadene cream and return to see Dr. Stanford Breed on July 16, 2012 or sooner as needed if problems arise.  It is recommended that she follow up with her primary care physician for evaluation of her left knee pain and right foot soreness.  Reportable signs and symptoms reviewed.  The patient was reviewed with Dr. Duard Brady along with Dr. Duard Brady visualizing the  vulva during examination.  Tina Sek DEAL, NP 06/18/2012, 1:57 PM

## 2012-06-18 NOTE — Patient Instructions (Signed)
Please continue your current regimen of sitz baths and Silvadene cream and return to see Dr. Stanford Breed on July 16, 2012 or sooner as needed if problems arise.  It is recommended that you follow up with her primary care physician for evaluation of your left knee pain and right foot soreness.    Thank you for coming to see me today.  I appreciate your confidence in choosing [facility] for your medical care.  If you have any questions about your visit today, please call our office and we will get back to you as soon as possible.  Warner Mccreedy, NP Gynecologic Oncology

## 2012-06-23 ENCOUNTER — Other Ambulatory Visit: Payer: Self-pay | Admitting: *Deleted

## 2012-06-23 MED ORDER — NITROGLYCERIN 0.4 MG SL SUBL: 0.4000 mg | SUBLINGUAL_TABLET | SUBLINGUAL | Status: DC | PRN

## 2012-07-08 ENCOUNTER — Ambulatory Visit: Payer: Medicare Other | Admitting: Cardiology

## 2012-07-16 ENCOUNTER — Ambulatory Visit: Payer: Medicare Other | Admitting: Gynecology

## 2012-07-18 ENCOUNTER — Other Ambulatory Visit (HOSPITAL_COMMUNITY): Payer: Self-pay | Admitting: Pulmonary Disease

## 2012-07-18 DIAGNOSIS — C8590 Non-Hodgkin lymphoma, unspecified, unspecified site: Secondary | ICD-10-CM

## 2012-07-18 DIAGNOSIS — M898X9 Other specified disorders of bone, unspecified site: Secondary | ICD-10-CM

## 2012-07-18 DIAGNOSIS — C519 Malignant neoplasm of vulva, unspecified: Secondary | ICD-10-CM

## 2012-07-22 ENCOUNTER — Encounter (HOSPITAL_COMMUNITY)
Admission: RE | Admit: 2012-07-22 | Discharge: 2012-07-22 | Disposition: A | Discharge status: Home / Self Care | Payer: Medicare Other | Source: Ambulatory Visit | Attending: Pulmonary Disease | Admitting: Pulmonary Disease

## 2012-07-22 ENCOUNTER — Encounter (HOSPITAL_COMMUNITY): Payer: Self-pay

## 2012-07-22 DIAGNOSIS — Z8544 Personal history of malignant neoplasm of other female genital organs: Secondary | ICD-10-CM | POA: Insufficient documentation

## 2012-07-22 DIAGNOSIS — R937 Abnormal findings on diagnostic imaging of other parts of musculoskeletal system: Secondary | ICD-10-CM | POA: Insufficient documentation

## 2012-07-22 DIAGNOSIS — Z87898 Personal history of other specified conditions: Secondary | ICD-10-CM | POA: Insufficient documentation

## 2012-07-22 DIAGNOSIS — C519 Malignant neoplasm of vulva, unspecified: Secondary | ICD-10-CM

## 2012-07-22 DIAGNOSIS — M898X9 Other specified disorders of bone, unspecified site: Secondary | ICD-10-CM

## 2012-07-22 DIAGNOSIS — C8590 Non-Hodgkin lymphoma, unspecified, unspecified site: Secondary | ICD-10-CM

## 2012-07-22 DIAGNOSIS — R52 Pain, unspecified: Secondary | ICD-10-CM | POA: Insufficient documentation

## 2012-07-22 HISTORY — DX: Unspecified asthma, uncomplicated: J45.909

## 2012-07-22 MED ORDER — TECHNETIUM TC 99M MEDRONATE IV KIT
25.0000 | PACK | Freq: Once | INTRAVENOUS | Status: AC | PRN
  Administered 2012-07-22: 25 via INTRAVENOUS

## 2012-07-25 ENCOUNTER — Ambulatory Visit: Payer: Medicare Other | Attending: Gynecology | Admitting: Gynecology

## 2012-07-25 ENCOUNTER — Encounter: Payer: Self-pay | Admitting: Gynecology

## 2012-07-25 VITALS — BP 110/56 | HR 68 | Temp 97.1°F | Resp 18 | Ht 65.39 in | Wt 147.4 lb

## 2012-07-25 DIAGNOSIS — F17200 Nicotine dependence, unspecified, uncomplicated: Secondary | ICD-10-CM | POA: Insufficient documentation

## 2012-07-25 DIAGNOSIS — C519 Malignant neoplasm of vulva, unspecified: Secondary | ICD-10-CM | POA: Insufficient documentation

## 2012-07-25 DIAGNOSIS — Z09 Encounter for follow-up examination after completed treatment for conditions other than malignant neoplasm: Secondary | ICD-10-CM | POA: Insufficient documentation

## 2012-07-25 DIAGNOSIS — I739 Peripheral vascular disease, unspecified: Secondary | ICD-10-CM | POA: Insufficient documentation

## 2012-07-25 DIAGNOSIS — Z7982 Long term (current) use of aspirin: Secondary | ICD-10-CM | POA: Insufficient documentation

## 2012-07-25 DIAGNOSIS — Z8673 Personal history of transient ischemic attack (TIA), and cerebral infarction without residual deficits: Secondary | ICD-10-CM | POA: Insufficient documentation

## 2012-07-25 DIAGNOSIS — J4489 Other specified chronic obstructive pulmonary disease: Secondary | ICD-10-CM | POA: Insufficient documentation

## 2012-07-25 DIAGNOSIS — K219 Gastro-esophageal reflux disease without esophagitis: Secondary | ICD-10-CM | POA: Insufficient documentation

## 2012-07-25 DIAGNOSIS — I2510 Atherosclerotic heart disease of native coronary artery without angina pectoris: Secondary | ICD-10-CM | POA: Insufficient documentation

## 2012-07-25 DIAGNOSIS — I10 Essential (primary) hypertension: Secondary | ICD-10-CM | POA: Insufficient documentation

## 2012-07-25 DIAGNOSIS — Z79899 Other long term (current) drug therapy: Secondary | ICD-10-CM | POA: Insufficient documentation

## 2012-07-25 DIAGNOSIS — J449 Chronic obstructive pulmonary disease, unspecified: Secondary | ICD-10-CM | POA: Insufficient documentation

## 2012-07-25 DIAGNOSIS — E785 Hyperlipidemia, unspecified: Secondary | ICD-10-CM | POA: Insufficient documentation

## 2012-07-25 NOTE — Progress Notes (Signed)
Consult Note: Gyn-Onc   [name] 57 y.o. female  Chief Complaint  Patient presents with  . Vulvar Cancer    Follow up    Interval History: The patient returns today as previously scheduled. Since her last visit she reports she's had complete healing of her vulvar wound. She has not noticed any new lesions and has no pruritus. Overall she feels quite well.  HPI:The patient underwent partial bilateral vulvectomy 05/20/2012. 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 In at least 3 areas microscopic invasive squamous cell carcinoma was identified. All of which was less than 1 mm of invasion. A posterior right vulvar lesion extending across the perineum and was so extensive we were unable to close it primarily and it was left open to granulate and epithelialize final pathology showed extensive VIN 3 with 3 areas of invasion of less than a millimeter. (Stage I a)   Review of Systems:10 point review of systems is negative as noted above.   Vitals: Blood pressure 110/56, pulse 68, temperature 97.1 F (36.2 C), temperature source Oral, resp. rate 18, height 5' 5.39" (1.661 m), weight 147 lb 6.4 oz (66.86 kg).  Physical Exam: General : The patient is a healthy woman in no acute distress.  HEENT: normocephalic, extraoccular movements normal; neck is supple without thyromegally  Lynphnodes: Supraclavicular and inguinal nodes not enlarged  Abdomen: Soft, non-tender, no ascites, no organomegally, no masses, no hernias  Pelvic:  EGBUS: Normal female, the posterior vulvar lesion has now completely epithelialized. There are no new lesions.  Vagina: Normal, no lesions  Urethra and Bladder: Normal, non-tender  Cervix: Surgically absent  Uterus: Surgically absent  Bi-manual examination: Non-tender; no adenxal masses or nodularity  Rectal: normal sphincter tone, no masses, no blood  Lower extremities: No edema or varicosities. Normal range of motion    Assessment/Plan: Stage I a vulvar  carcinoma arising in an extensive field of VIN 3. The patient has fully recovered from surgery and there are no new lesions. [name] warned about the signs and symptoms of new lesions.  At this juncture we'll ask her to return to the care of her primary gynecologist Dr. Despina Hidden in refill and would recommend she have examinations of the vulva every 6 months. I would be happy to see the patient back in the future if necessary.  No Known Allergies  Past Medical History  Diagnosis Date  . Coronary artery disease     status post stenting of the aortic and iliac vessels by Dr. Allyson Sabal. Repeat aortic stenting 2010  . Left ventricular dysfunction     hx of with ejection fraction of 35% range.  . Carotid artery disease     hx of carotid cerebrovascular disease with 0-39% bilateral internal carotid artery setenosis   . Non Hodgkin's lymphoma   . Chronic back pain     status post lumbar surgery  . Hyperlipidemia   . Hypertension   . COPD (chronic obstructive pulmonary disease)   . Shortness of breath   . Cancer     hx of non hogdin lymphoma  . Stroke   . CHF (congestive heart failure)   . Blood transfusion   . Arthritis   . Myocardial infarction   . Peripheral vascular disease   . GERD (gastroesophageal reflux disease)   . Anemia   . Osteoporosis   . Depression   . Anxiety   . Other malignant lymphomas, unspecified site, extranodal and solid organ sites 2004    Back  .  Asthma     Past Surgical History  Procedure Date  . Lumar surg   . Back surgery     x 2  . Lung biopsy   . Cardiac catheterization   . Abdominal aortogram 08/08/2011  . Abdominal hysterectomy     partial  . Portacath placement     pt says its for chemo only and its an old port   . Dilation and curettage of uterus   . Vulva /perineum biopsy 04/09/2012    Procedure: VULVAR BIOPSY;  Surgeon: [name], MD;  Location: [facility];  Service: Gynecology;  Laterality: N/A;  . Vulvectomy 05/20/2012    Procedure: VULVECTOMY;   Surgeon: Jeannette Corpus, MD;  Location: [facility];  Service: Gynecology;  Laterality: Bilateral;  BILATERAL SIMPLE VULVECTOMIES    Current Outpatient Prescriptions  Medication Sig Dispense Refill  . albuterol (VENTOLIN HFA) 108 (90 BASE) MCG/ACT inhaler Inhale 2 puffs into the lungs every 6 (six) hours as needed. For asthma/wheezing      . ALPRAZolam (XANAX) 1 MG tablet Take 1 mg by mouth 3 (three) times daily.       [name] amitriptyline (ELAVIL) 50 MG tablet Take 50 mg by mouth at bedtime.      [name] aspirin 81 MG EC tablet Take 81 mg by mouth daily.        [name] buPROPion (WELLBUTRIN XL) 150 MG 24 hr tablet Take 150 mg by mouth daily.      . clopidogrel (PLAVIX) 75 MG tablet Take 1 tablet (75 mg total) by mouth daily.  30 tablet  11  . cycloSPORINE (RESTASIS) 0.05 % ophthalmic emulsion Place 1 drop into both eyes 2 (two) times daily.      . diazepam (VALIUM) 5 MG tablet Take 5 mg by mouth every 6 (six) hours as needed. For muscle spasms      . LOPRESSOR 50 MG tablet TAKE (1/2) TABLET BY MOUTH TWICE DAILY.  30 tablet  6  . mupirocin ointment (BACTROBAN) 2 % Apply 1 application topically daily. As needed for leg infection.      [name] NAPROXEN PO Take by mouth 2 (two) times daily.      [name] NEXIUM 40 MG capsule Take 40 mg by mouth Twice daily.      [name] oxyCODONE (OXYCONTIN) 80 MG 12 hr tablet Take 80 mg by mouth 4 (four) times daily.      [name] oxycodone (ROXICODONE) 30 MG immediate release tablet Take 30 mg by mouth every 4 (four) hours as needed. For breakthrough pain      . rosuvastatin (CRESTOR) 40 MG tablet Take 40 mg by mouth daily.        [name] sulfamethoxazole-trimethoprim (BACTRIM DS) 800-160 MG per tablet Take 1 tablet by mouth See admin instructions. Takes 1 tablet twice a day on Saturday and Sunday only.      . vitamin B-12 (CYANOCOBALAMIN) 1000 MCG tablet Take 1,000 mcg by mouth daily.      . nitroGLYCERIN (NITROSTAT) 0.4 MG SL tablet Place 1 tablet (0.4 mg total) under the tongue every 5 (five) minutes as  needed for chest pain.  25 tablet  3  . silver sulfADIAZINE (SILVADENE) 1 % cream Apply 1 application topically 2 (two) times daily. Apply to vulva surgical site.        History   Social History  . Marital Status: Divorced    Spouse Name: N/A    Number of Children: 2  . Years of Education: N/A  Occupational History  . retired    Social History Main Topics  . Smoking status: Current Every Day Smoker -- 1.0 packs/day for 20 years    Types: Cigarettes  . Smokeless tobacco: Never Used  . Alcohol Use: No  . Drug Use: No  . Sexually Active: No   Other Topics Concern  . Not on file   Social History Narrative  . No narrative on file    Family History  Problem Relation Age of Onset  . Coronary artery disease      positive for vascular and cardiac disease  . Heart disease Mother   . Lung cancer Mother   . Heart attack Mother   . Heart failure Mother   . Heart disease Father   . Kidney cancer Father   . Heart attack Father   . Heart failure Father   . Diabetes Sister   . Aortic aneurysm Sister   . Heart attack Brother   . Asthma Son   . Sleep apnea Son   . Endometriosis Daughter       Jeannette Corpus, MD 07/25/2012, 1:39 PM

## 2012-07-25 NOTE — Patient Instructions (Signed)
Please return to the care of Dr. Despina Hidden in [place] and have a visit with him every 6 months.

## 2012-07-28 ENCOUNTER — Ambulatory Visit (HOSPITAL_COMMUNITY)
Admission: RE | Admit: 2012-07-28 | Discharge: 2012-07-28 | Disposition: A | Discharge status: Home / Self Care | Payer: Medicare Other | Source: Ambulatory Visit | Attending: Pulmonary Disease | Admitting: Pulmonary Disease

## 2012-07-28 ENCOUNTER — Other Ambulatory Visit (HOSPITAL_COMMUNITY): Payer: Self-pay | Admitting: Pulmonary Disease

## 2012-07-28 DIAGNOSIS — R948 Abnormal results of function studies of other organs and systems: Secondary | ICD-10-CM

## 2012-07-28 DIAGNOSIS — M79604 Pain in right leg: Secondary | ICD-10-CM

## 2012-07-28 DIAGNOSIS — R937 Abnormal findings on diagnostic imaging of other parts of musculoskeletal system: Secondary | ICD-10-CM | POA: Insufficient documentation

## 2012-07-28 DIAGNOSIS — M79609 Pain in unspecified limb: Secondary | ICD-10-CM | POA: Insufficient documentation

## 2012-07-29 ENCOUNTER — Other Ambulatory Visit (HOSPITAL_COMMUNITY): Payer: Self-pay | Admitting: Pulmonary Disease

## 2012-07-29 ENCOUNTER — Ambulatory Visit (HOSPITAL_COMMUNITY)
Admission: RE | Admit: 2012-07-29 | Discharge: 2012-07-29 | Disposition: A | Discharge status: Home / Self Care | Payer: Medicare Other | Source: Ambulatory Visit | Attending: Pulmonary Disease | Admitting: Pulmonary Disease

## 2012-07-29 DIAGNOSIS — L905 Scar conditions and fibrosis of skin: Secondary | ICD-10-CM

## 2012-07-29 DIAGNOSIS — L989 Disorder of the skin and subcutaneous tissue, unspecified: Secondary | ICD-10-CM

## 2013-01-20 ENCOUNTER — Other Ambulatory Visit: Payer: Self-pay | Admitting: *Deleted

## 2013-01-20 MED ORDER — CLOPIDOGREL BISULFATE 75 MG PO TABS: 75.0000 mg | ORAL_TABLET | Freq: Every day | ORAL | Status: DC

## 2013-01-21 ENCOUNTER — Other Ambulatory Visit: Payer: Self-pay | Admitting: *Deleted

## 2013-01-21 MED ORDER — CLOPIDOGREL BISULFATE 75 MG PO TABS: 75.0000 mg | ORAL_TABLET | Freq: Every day | ORAL | Status: DC

## 2013-02-17 ENCOUNTER — Other Ambulatory Visit: Payer: Self-pay | Admitting: Cardiology

## 2013-03-21 ENCOUNTER — Other Ambulatory Visit: Payer: Self-pay | Admitting: Cardiovascular Disease

## 2013-03-31 ENCOUNTER — Ambulatory Visit: Payer: Medicare Other | Admitting: Cardiovascular Disease

## 2013-04-18 ENCOUNTER — Other Ambulatory Visit: Payer: Self-pay | Admitting: Cardiovascular Disease

## 2013-05-21 ENCOUNTER — Other Ambulatory Visit (HOSPITAL_COMMUNITY): Payer: Self-pay | Admitting: Pulmonary Disease

## 2013-05-21 DIAGNOSIS — Z139 Encounter for screening, unspecified: Secondary | ICD-10-CM

## 2013-05-25 ENCOUNTER — Ambulatory Visit (HOSPITAL_COMMUNITY)
Admission: RE | Admit: 2013-05-25 | Discharge: 2013-05-25 | Disposition: A | Discharge status: Home / Self Care | Payer: Medicare Other | Source: Ambulatory Visit | Attending: Pulmonary Disease | Admitting: Pulmonary Disease

## 2013-05-25 DIAGNOSIS — Z139 Encounter for screening, unspecified: Secondary | ICD-10-CM

## 2013-05-25 DIAGNOSIS — Z1231 Encounter for screening mammogram for malignant neoplasm of breast: Secondary | ICD-10-CM | POA: Insufficient documentation

## 2013-05-26 ENCOUNTER — Ambulatory Visit (HOSPITAL_COMMUNITY): Payer: Medicare Other

## 2013-06-17 ENCOUNTER — Other Ambulatory Visit: Payer: Self-pay | Admitting: Cardiovascular Disease

## 2013-06-17 ENCOUNTER — Encounter: Payer: Self-pay | Admitting: *Deleted

## 2013-06-17 ENCOUNTER — Encounter: Payer: Self-pay | Admitting: Cardiovascular Disease

## 2013-06-17 ENCOUNTER — Ambulatory Visit (HOSPITAL_COMMUNITY): Payer: Medicare Other | Attending: Internal Medicine

## 2013-06-17 ENCOUNTER — Ambulatory Visit (INDEPENDENT_AMBULATORY_CARE_PROVIDER_SITE_OTHER): Payer: Medicare Other | Admitting: Cardiovascular Disease

## 2013-06-17 VITALS — BP 133/58 | HR 73 | Ht 65.0 in | Wt 182.0 lb

## 2013-06-17 DIAGNOSIS — J449 Chronic obstructive pulmonary disease, unspecified: Secondary | ICD-10-CM | POA: Insufficient documentation

## 2013-06-17 DIAGNOSIS — I70229 Atherosclerosis of native arteries of extremities with rest pain, unspecified extremity: Secondary | ICD-10-CM

## 2013-06-17 DIAGNOSIS — J4489 Other specified chronic obstructive pulmonary disease: Secondary | ICD-10-CM | POA: Insufficient documentation

## 2013-06-17 DIAGNOSIS — I2510 Atherosclerotic heart disease of native coronary artery without angina pectoris: Secondary | ICD-10-CM

## 2013-06-17 DIAGNOSIS — I739 Peripheral vascular disease, unspecified: Secondary | ICD-10-CM

## 2013-06-17 DIAGNOSIS — I10 Essential (primary) hypertension: Secondary | ICD-10-CM

## 2013-06-17 DIAGNOSIS — F17200 Nicotine dependence, unspecified, uncomplicated: Secondary | ICD-10-CM | POA: Insufficient documentation

## 2013-06-17 DIAGNOSIS — E785 Hyperlipidemia, unspecified: Secondary | ICD-10-CM | POA: Insufficient documentation

## 2013-06-17 DIAGNOSIS — I771 Stricture of artery: Secondary | ICD-10-CM | POA: Insufficient documentation

## 2013-06-17 NOTE — Progress Notes (Signed)
HPI:  57 year old woman presenting for followup evaluation. The patient has been followed for severe peripheral arterial disease. She was treated with bilateral iliac stents in February 2013. She had critical limb ischemia on the right. She has been a long-term smoker. She also has coronary artery disease with chronic occlusion of the RCA and left to right collaterals.  She's gained a lot of weight since I saw her last. She's not been following a diet and she leads a sedentary lifestyle. She's had occasional angina with no change in pattern. She takes nitroglycerin as needed and uses this rarely. She denies orthopnea, PND, or palpitations. She does have chronic dyspnea and she admits to continued smoking at least one pack per day. She feels a "stretching" pain in her right foot, not related to physical exertion. She denies typical symptoms of claudication.  Outpatient Encounter Prescriptions as of 06/17/2013  Medication Sig  . albuterol (VENTOLIN HFA) 108 (90 BASE) MCG/ACT inhaler Inhale 2 puffs into the lungs every 6 (six) hours as needed. For asthma/wheezing  . ALPRAZolam (XANAX) 1 MG tablet Take 1 mg by mouth 3 (three) times daily.   [name] amitriptyline (ELAVIL) 50 MG tablet Take 50 mg by mouth at bedtime.  [name] aspirin 81 MG EC tablet Take 81 mg by mouth daily.    [name] buPROPion (WELLBUTRIN XL) 150 MG 24 hr tablet Take 150 mg by mouth daily.  . cycloSPORINE (RESTASIS) 0.05 % ophthalmic emulsion Place 1 drop into both eyes 2 (two) times daily.  . diazepam (VALIUM) 5 MG tablet Take 5 mg by mouth every 6 (six) hours as needed. For muscle spasms  . furosemide (LASIX) 40 MG tablet As needed  . metoprolol (LOPRESSOR) 50 MG tablet TAKE (1/2) TABLET BY MOUTH TWICE DAILY.  . mupirocin ointment (BACTROBAN) 2 % Apply 1 application topically daily. As needed for leg infection.  [name] NAPROXEN PO Take by mouth 2 (two) times daily.  [name] NEXIUM 40 MG capsule Take 40 mg by mouth Twice daily.  . nitroGLYCERIN (NITROSTAT)  0.4 MG SL tablet Place 1 tablet (0.4 mg total) under the tongue every 5 (five) minutes as needed for chest pain.  [name] oxyCODONE (OXYCONTIN) 80 MG 12 hr tablet Take 80 mg by mouth 4 (four) times daily.  [name] oxycodone (ROXICODONE) 30 MG immediate release tablet Take 30 mg by mouth every 4 (four) hours as needed. For breakthrough pain  . rosuvastatin (CRESTOR) 40 MG tablet Take 40 mg by mouth daily.    . silver sulfADIAZINE (SILVADENE) 1 % cream Apply 1 application topically 2 (two) times daily. Apply to vulva surgical site.  . sulfamethoxazole-trimethoprim (BACTRIM DS) 800-160 MG per tablet Take 1 tablet by mouth See admin instructions. Takes 1 tablet twice a day on Saturday and Sunday only.  . vitamin B-12 (CYANOCOBALAMIN) 1000 MCG tablet Take 1,000 mcg by mouth daily.  . [DISCONTINUED] clopidogrel (PLAVIX) 75 MG tablet TAKE 1 TABLET DAILY WITH BREAKFAST.    No Known Allergies  Past Medical History  Diagnosis Date  . Coronary artery disease     status post stenting of the aortic and iliac vessels by Dr. Allyson Sabal. Repeat aortic stenting 2010  . Left ventricular dysfunction     hx of with ejection fraction of 35% range.  . Carotid artery disease     hx of carotid cerebrovascular disease with 0-39% bilateral internal carotid artery setenosis   . Non Hodgkin's lymphoma   . Chronic back pain     status post lumbar  surgery  . Hyperlipidemia   . Hypertension   . COPD (chronic obstructive pulmonary disease)   . Shortness of breath   . Cancer     hx of non hogdin lymphoma  . Stroke   . CHF (congestive heart failure)   . Blood transfusion   . Arthritis   . Myocardial infarction   . Peripheral vascular disease   . GERD (gastroesophageal reflux disease)   . Anemia   . Osteoporosis   . Depression   . Anxiety   . Other malignant lymphomas, unspecified site, extranodal and solid organ sites 2004    Back  . Asthma     ROS: Negative except as per HPI  BP 133/58  Pulse 73  Ht 5\' 5"  (1.651 m)   Wt 182 lb (82.555 kg)  BMI 30.29 kg/m2  PHYSICAL EXAM: Pt is alert and oriented, NAD HEENT: normal Neck: JVP - normal, carotids 2+= without bruits Lungs: CTA bilaterally CV: RRR without murmur or gallop Abd: soft, NT, Positive BS, obese Ext: Femoral pulses are faint, 2+ right pretibial edema, nondominant left. The right DP pulse is 2+, the left is 1+. Skin: Dry, scaly  EKG:  Normal sinus rhythm 73 beats per minute, within normal limits.  ASSESSMENT AND PLAN: 1. Lower extremity peripheral arterial disease, primarily with aortoiliac stenosis. She has undergone stenting, most recently in 2013 when she was treated with covered iliac stents. She does have palpable pulses in her feet. She has leg pain that is somewhat atypical. Recommend repeat ABIs. Tobacco cessation counseling was done. She will remain on aspirin and Plavix.  2. Coronary artery disease, native vessel. Stable with CCS class II angina. No med changes were made. She will use nitroglycerin as needed. She understands to call if any change in pattern.  3. Heavy tobacco abuse. This continues to be one of her primary problems. I'm not optimistic that she will ever be able to quit. She was counseled today.  4. Hyperlipidemia. Patient is treated with high-dose Crestor. [name] followed by Dr. Juanetta Gosling.  She will have ABIs today. I will plan on seeing her back in one year unless problems arise.  [name] 06/17/2013 3:02 PM

## 2013-06-17 NOTE — Patient Instructions (Signed)
Your physician has requested that you have an ankle brachial index (ABI). During this test an ultrasound and blood pressure cuff are used to evaluate the arteries that supply the arms and legs with blood. Allow thirty minutes for this exam. There are no restrictions or special instructions.  Your physician recommends that you continue on your current medications as directed. Please refer to the Current Medication list given to you today.  Your physician wants you to follow-up in: 1 YEAR with Dr Excell Seltzer.  You will receive a reminder letter in the mail two months in advance. If you don't receive a letter, please call our office to schedule the follow-up appointment.  Heart Failure Heart failure is a condition in which the heart has trouble pumping blood. This means your heart does not pump blood efficiently for your body to work well. In some cases of heart failure, fluid may back up into your lungs or you may have swelling (edema) in your lower legs. Heart failure is usually a long-term (chronic) condition. It is important for you to take good care of yourself and follow your caregiver's treatment plan. CAUSES  Some health conditions can cause heart failure. Those health conditions include:  High blood pressure (hypertension) causes the heart muscle to work harder than normal. When pressure in the blood vessels is high, the heart needs to pump (contract) with more force in order to circulate blood throughout the body. High blood pressure eventually causes the heart to become stiff and weak.  Coronary artery disease (CAD) is the buildup of cholesterol and fat (plaque) in the arteries of the heart. The blockage in the arteries deprives the heart muscle of oxygen and blood. This can cause chest pain and may lead to a heart attack. High blood pressure can also contribute to CAD.  Heart attack (myocardial infarction) occurs when 1 or more arteries in the heart become blocked. The loss of oxygen damages the  muscle tissue of the heart. When this happens, part of the heart muscle dies. The injured tissue does not contract as well and weakens the heart's ability to pump blood.  Abnormal heart valves can cause heart failure when the heart valves do not open and close properly. This makes the heart muscle pump harder to keep the blood flowing.  Heart muscle disease (cardiomyopathy or myocarditis) is damage to the heart muscle from a variety of causes. These can include drug or alcohol abuse, infections, or unknown reasons. These can increase the risk of heart failure.  Lung disease makes the heart work harder because the lungs do not work properly. This can cause a strain on the heart, leading it to fail.  Diabetes increases the risk of heart failure. High blood sugar contributes to high fat (lipid) levels in the blood. Diabetes can also cause slow damage to tiny blood vessels that carry important nutrients to the heart muscle. When the heart does not get enough oxygen and food, it can cause the heart to become weak and stiff. This leads to a heart that does not contract efficiently.  Other conditions can contribute to heart failure. These include abnormal heart rhythms, thyroid problems, and low blood counts (anemia). Certain unhealthy behaviors can increase the risk of heart failure. Those unhealthy behaviors include:  Being overweight.  Smoking or chewing tobacco.  Eating foods high in fat and cholesterol.  Abusing illicit drugs or alcohol.  Lacking physical activity. SYMPTOMS  Heart failure symptoms may vary and can be hard to detect. Symptoms may include:  Shortness of breath with activity, such as climbing stairs.  Persistent cough.  Swelling of the feet, ankles, legs, or abdomen.  Unexplained weight gain.  Difficulty breathing when lying flat (orthopnea).  Waking from sleep because of the need to sit up and get more air.  Rapid heartbeat.  Fatigue and loss of energy.  Feeling  lightheaded, dizzy, or close to fainting.  Loss of appetite.  Nausea.  Increased urination during the night (nocturia). DIAGNOSIS  A diagnosis of heart failure is based on your history, symptoms, physical examination, and diagnostic tests. Diagnostic tests for heart failure may include:  Echocardiography.  Electrocardiography.  Chest X-ray.  Blood tests.  Exercise stress test.  Cardiac angiography.  Radionuclide scans. TREATMENT  Treatment is aimed at managing the symptoms of heart failure. Medicines, behavioral changes, or surgical intervention may be necessary to treat heart failure.  Medicines to help treat heart failure may include:  Angiotensin-converting enzyme (ACE) inhibitors. This type of medicine blocks the effects of a blood protein called angiotensin-converting enzyme. ACE inhibitors relax (dilate) the blood vessels and help lower blood pressure.  Angiotensin receptor blockers. This type of medicine blocks the actions of a blood protein called angiotensin. Angiotensin receptor blockers dilate the blood vessels and help lower blood pressure.  Water pills (diuretics). Diuretics cause the kidneys to remove salt and water from the blood. The extra fluid is removed through urination. This loss of extra fluid lowers the volume of blood the heart pumps.  Beta blockers. These prevent the heart from beating too fast and improve heart muscle strength.  Digitalis. This increases the force of the heartbeat.  Healthy behavior changes include:  Obtaining and maintaining a healthy weight.  Stopping smoking or chewing tobacco.  Eating heart healthy foods.  Limiting or avoiding alcohol.  Stopping illicit drug use.  Physical activity as directed by your caregiver.  Surgical treatment for heart failure may include:  A procedure to open blocked arteries, repair damaged heart valves, or remove damaged heart muscle tissue.  A pacemaker to improve heart muscle function  and control certain abnormal heart rhythms.  An internal cardioverter defibrillator to treat certain serious abnormal heart rhythms.  A left ventricular assist device to assist the pumping ability of the heart. HOME CARE INSTRUCTIONS   Take your medicine as directed by your caregiver. Medicines are important in reducing the workload of your heart, slowing the progression of heart failure, and improving your symptoms.  Do not stop taking your medicine unless directed by your caregiver.  Do not skip any dose of medicine.  Refill your prescriptions before you run out of medicine. Your medicines are needed every day.  Take over-the-counter medicine only as directed by your caregiver or pharmacist.  Engage in moderate physical activity if directed by your caregiver. Moderate physical activity can benefit some people. The elderly and people with severe heart failure should consult with a caregiver for physical activity recommendations.  Eat heart healthy foods. Food choices should be free of trans fat and low in saturated fat, cholesterol, and salt (sodium). Healthy choices include fresh or frozen fruits and vegetables, fish, lean meats, legumes, fat-free or low-fat dairy products, and whole grain or high fiber foods. Talk to a dietitian to learn more about heart healthy foods.  Limit sodium if directed by your caregiver. Sodium restriction may reduce symptoms of heart failure in some people. Talk to a dietitian to learn more about heart healthy seasonings.  Use healthy cooking methods. Healthy cooking methods include roasting, grilling,  broiling, baking, poaching, steaming, or stir-frying. Talk to a dietitian to learn more about healthy cooking methods.  Limit fluids if directed by your caregiver. Fluid restriction may reduce symptoms of heart failure in some people.  Weigh yourself every day. Daily weights are important in the early recognition of excess fluid. You should weigh yourself every  morning after you urinate and before you eat breakfast. Wear the same amount of clothing each time you weigh yourself. Record your daily weight. Provide your caregiver with your weight record.  Monitor and record your blood pressure if directed by your caregiver.  Check your pulse if directed by your caregiver.  Lose weight if directed by your caregiver. Weight loss may reduce symptoms of heart failure in some people.  Stop smoking or chewing tobacco. Nicotine makes your heart work harder by causing your blood vessels to constrict. Do not use nicotine gum or patches before talking to your caregiver.  Schedule and attend follow-up visits as directed by your caregiver. It is important to keep all your appointments.  Limit alcohol intake to no more than 1 drink per day for nonpregnant women and 2 drinks per day for men. Drinking more than that is harmful to your heart. Tell your caregiver if you drink alcohol several times a week. Talk with your caregiver about whether alcohol is safe for you. If your heart has already been damaged by alcohol or you have severe heart failure, drinking alcohol should be stopped completely.  Stop illicit drug use.  Stay up-to-date with immunizations. It is especially important to prevent respiratory infections through current pneumococcal and influenza immunizations.  Manage other health conditions such as hypertension, diabetes, thyroid disease, or abnormal heart rhythms as directed by your caregiver.  Learn to manage stress.  Plan rest periods when fatigued.  Learn strategies to manage high temperatures. If the weather is extremely hot:  Avoid vigorous physical activity.  Use air conditioning or fans or seek a cooler location.  Avoid caffeine and alcohol.  Wear loose-fitting, lightweight, and light-colored clothing.  Learn strategies to manage cold temperatures. If the weather is extremely cold:  Avoid vigorous physical activity.  Layer  clothes.  Wear mittens or gloves, a hat, and a scarf when going outside.  Avoid alcohol.  Obtain ongoing education and support as needed.  Participate or seek rehabilitation as needed to maintain or improve independence and quality of life. SEEK MEDICAL CARE IF:   Your weight increases by 03 lb/1.4 kg in 1 day or 05 lb/2.3 kg in a week.  You have increasing shortness of breath that is unusual for you.  You are unable to participate in your usual physical activities.  You tire easily.  You cough more than normal, especially with physical activity.  You have any or more swelling in areas such as your hands, feet, ankles, or abdomen.  You are unable to sleep because it is hard to breathe.  You feel like your heart is beating fast (palpitations).  You become dizzy or lightheaded upon standing up. SEEK IMMEDIATE MEDICAL CARE IF:   You have difficulty breathing.  There is a change in mental status such as decreased alertness or difficulty with concentration.  You have a pain or discomfort in your chest.  You have an episode of fainting (syncope). MAKE SURE YOU:   Understand these instructions.  Will watch your condition.  Will get help right away if you are not doing well or get worse. Document Released: 06/18/2005 Document Revised: 10/13/2012 Document Reviewed:  07/10/2012 [employer] Patient Information 2014 [place], [place].

## 2013-06-29 ENCOUNTER — Other Ambulatory Visit: Payer: Self-pay | Admitting: Cardiology

## 2013-07-03 NOTE — Telephone Encounter (Signed)
No other info °

## 2013-07-17 ENCOUNTER — Other Ambulatory Visit: Payer: Self-pay | Admitting: Cardiovascular Disease

## 2013-08-13 ENCOUNTER — Telehealth: Payer: Self-pay | Admitting: Cardiovascular Disease

## 2013-08-13 NOTE — Telephone Encounter (Signed)
New message     Talk to a nurse-----trying to get the name of a procedure she had done about 54yrs ago.

## 2013-08-13 NOTE — Telephone Encounter (Signed)
Spoke with patient and answered her questions.  I was unable to find the name of the word the patient was looking for but patient was satisfied with my assistance.

## 2013-08-19 ENCOUNTER — Other Ambulatory Visit (HOSPITAL_COMMUNITY): Payer: Self-pay | Admitting: Nephrology

## 2013-08-19 DIAGNOSIS — N189 Chronic kidney disease, unspecified: Secondary | ICD-10-CM

## 2013-09-01 ENCOUNTER — Ambulatory Visit (HOSPITAL_COMMUNITY): Payer: Medicare Other

## 2013-09-08 ENCOUNTER — Other Ambulatory Visit (HOSPITAL_COMMUNITY): Payer: Self-pay | Admitting: Pulmonary Disease

## 2013-09-08 DIAGNOSIS — C8590 Non-Hodgkin lymphoma, unspecified, unspecified site: Secondary | ICD-10-CM

## 2013-09-10 ENCOUNTER — Ambulatory Visit (HOSPITAL_COMMUNITY)
Admission: RE | Admit: 2013-09-10 | Discharge: 2013-09-10 | Disposition: A | Discharge status: Home / Self Care | Payer: Medicare Other | Source: Ambulatory Visit | Attending: Nephrology | Admitting: Nephrology

## 2013-09-10 DIAGNOSIS — R918 Other nonspecific abnormal finding of lung field: Secondary | ICD-10-CM | POA: Insufficient documentation

## 2013-09-10 DIAGNOSIS — Z87898 Personal history of other specified conditions: Secondary | ICD-10-CM | POA: Insufficient documentation

## 2013-09-10 DIAGNOSIS — N19 Unspecified kidney failure: Secondary | ICD-10-CM | POA: Insufficient documentation

## 2013-09-10 DIAGNOSIS — N189 Chronic kidney disease, unspecified: Secondary | ICD-10-CM

## 2013-09-12 ENCOUNTER — Other Ambulatory Visit: Payer: Self-pay | Admitting: Cardiology

## 2013-09-15 ENCOUNTER — Ambulatory Visit (HOSPITAL_COMMUNITY)
Admission: RE | Admit: 2013-09-15 | Discharge: 2013-09-15 | Disposition: A | Discharge status: Home / Self Care | Payer: Medicare Other | Source: Ambulatory Visit | Attending: Pulmonary Disease | Admitting: Pulmonary Disease

## 2013-09-15 DIAGNOSIS — C8589 Other specified types of non-Hodgkin lymphoma, extranodal and solid organ sites: Secondary | ICD-10-CM | POA: Insufficient documentation

## 2013-09-15 DIAGNOSIS — R911 Solitary pulmonary nodule: Secondary | ICD-10-CM | POA: Insufficient documentation

## 2013-09-15 DIAGNOSIS — C8590 Non-Hodgkin lymphoma, unspecified, unspecified site: Secondary | ICD-10-CM

## 2013-09-15 LAB — GLUCOSE, CAPILLARY: Glucose-Capillary: 109 mg/dL — ABNORMAL HIGH (ref 70–99)

## 2013-09-15 MED ORDER — FLUDEOXYGLUCOSE F - 18 (FDG) INJECTION
9.0000 | Freq: Once | INTRAVENOUS | Status: AC | PRN
  Administered 2013-09-15: 9 via INTRAVENOUS

## 2013-09-21 ENCOUNTER — Other Ambulatory Visit (HOSPITAL_COMMUNITY): Payer: Self-pay | Admitting: Pulmonary Disease

## 2013-09-21 DIAGNOSIS — C8590 Non-Hodgkin lymphoma, unspecified, unspecified site: Secondary | ICD-10-CM

## 2013-09-23 ENCOUNTER — Other Ambulatory Visit: Payer: Self-pay | Admitting: Radiology

## 2013-09-25 ENCOUNTER — Other Ambulatory Visit: Payer: Self-pay | Admitting: Radiology

## 2013-09-28 ENCOUNTER — Encounter (HOSPITAL_COMMUNITY): Payer: Self-pay

## 2013-09-28 ENCOUNTER — Other Ambulatory Visit (HOSPITAL_COMMUNITY): Payer: Self-pay | Admitting: Pulmonary Disease

## 2013-09-28 ENCOUNTER — Ambulatory Visit (HOSPITAL_COMMUNITY)
Admission: RE | Admit: 2013-09-28 | Discharge: 2013-09-28 | Disposition: A | Discharge status: Home / Self Care | Payer: Medicare Other | Source: Ambulatory Visit | Attending: Pulmonary Disease | Admitting: Pulmonary Disease

## 2013-09-28 DIAGNOSIS — C8590 Non-Hodgkin lymphoma, unspecified, unspecified site: Secondary | ICD-10-CM

## 2013-09-28 DIAGNOSIS — Z87898 Personal history of other specified conditions: Secondary | ICD-10-CM | POA: Insufficient documentation

## 2013-09-28 DIAGNOSIS — R599 Enlarged lymph nodes, unspecified: Secondary | ICD-10-CM | POA: Insufficient documentation

## 2013-09-28 LAB — CBC
HCT: 36.7 % (ref 36.0–46.0)
Hemoglobin: 12.5 g/dL (ref 12.0–15.0)
MCH: 32.9 pg (ref 26.0–34.0)
MCHC: 34.1 g/dL (ref 30.0–36.0)
MCV: 96.6 fL (ref 78.0–100.0)
PLATELETS: 119 10*3/uL — AB (ref 150–400)
RBC: 3.8 MIL/uL — ABNORMAL LOW (ref 3.87–5.11)
RDW: 14.7 % (ref 11.5–15.5)
WBC: 6.6 10*3/uL (ref 4.0–10.5)

## 2013-09-28 LAB — APTT: aPTT: 23 seconds — ABNORMAL LOW (ref 24–37)

## 2013-09-28 LAB — PROTIME-INR
INR: 0.93 (ref 0.00–1.49)
PROTHROMBIN TIME: 12.3 s (ref 11.6–15.2)

## 2013-09-28 MED ORDER — MIDAZOLAM HCL 2 MG/2ML IJ SOLN
INTRAMUSCULAR | Status: AC | PRN
  Administered 2013-09-28 (×2): 1 mg via INTRAVENOUS

## 2013-09-28 MED ORDER — MIDAZOLAM HCL 2 MG/2ML IJ SOLN
INTRAMUSCULAR | Status: AC
  Filled 2013-09-28: qty 4

## 2013-09-28 MED ORDER — FENTANYL CITRATE 0.05 MG/ML IJ SOLN
INTRAMUSCULAR | Status: AC | PRN
  Administered 2013-09-28 (×2): 50 ug via INTRAVENOUS

## 2013-09-28 MED ORDER — SODIUM CHLORIDE 0.9 % IV SOLN: INTRAVENOUS | Status: DC

## 2013-09-28 MED ORDER — FENTANYL CITRATE 0.05 MG/ML IJ SOLN
INTRAMUSCULAR | Status: AC
  Filled 2013-09-28: qty 2

## 2013-09-28 NOTE — Procedures (Signed)
L ax LN Bx and Core No comp

## 2013-09-28 NOTE — H&P (Signed)
Chief Complaint: "I am here for a biopsy." Referring Physician: [name] HPI: [name] is an 58 y.o. female who presents today for a left axillary lymph node biopsy. She has a history of lymphoma last chemotherapy 2005. She complains of night sweats x 1 month, denies any fever, chills or weight loss. She had a CT 09/10/13 for history of lymphoma and renal failure and PET on 09/15/13 which revealed hypermetabolic left axillary lymph node. She denies any chest pain, shortness of breath or palpitations. She denies any active signs of bleeding. The patient denies any history of sleep apnea or chronic oxygen use. She has previously tolerated sedation without complications.   Past Medical History:  Past Medical History  Diagnosis Date  . Coronary artery disease     status post stenting of the aortic and iliac vessels by Dr. Gwenlyn Found. Repeat aortic stenting 2010  . Left ventricular dysfunction     hx of with ejection fraction of 35% range.  . Carotid artery disease     hx of carotid cerebrovascular disease with 0-39% bilateral internal carotid artery setenosis   . Non Hodgkin's lymphoma   . Chronic back pain     status post lumbar surgery  . Hyperlipidemia   . Hypertension   . COPD (chronic obstructive pulmonary disease)   . Shortness of breath   . Cancer     hx of non hogdin lymphoma  . Stroke   . CHF (congestive heart failure)   . Blood transfusion   . Arthritis   . Myocardial infarction   . Peripheral vascular disease   . GERD (gastroesophageal reflux disease)   . Anemia   . Osteoporosis   . Depression   . Anxiety   . Other malignant lymphomas, unspecified site, extranodal and solid organ sites 2004    Back  . Asthma     Past Surgical History:  Past Surgical History  Procedure Laterality Date  . Lumar surg    . Back surgery      x 2  . Lung biopsy    . Cardiac catheterization    . Abdominal aortogram  08/08/2011  . Abdominal hysterectomy      partial  . Portacath  placement      pt says its for chemo only and its an old port   . Dilation and curettage of uterus    . Vulva /perineum biopsy  04/09/2012    Procedure: VULVAR BIOPSY;  Surgeon: Florian Buff, MD;  Location: [facility];  Service: Gynecology;  Laterality: N/A;  . Vulvectomy  05/20/2012    Procedure: VULVECTOMY;  Surgeon: Alvino Chapel, MD;  Location: [facility];  Service: Gynecology;  Laterality: Bilateral;  BILATERAL SIMPLE VULVECTOMIES    Family History:  Family History  Problem Relation Age of Onset  . Coronary artery disease      positive for vascular and cardiac disease  . Heart disease Mother   . Lung cancer Mother   . Heart attack Mother   . Heart failure Mother   . Heart disease Father   . Kidney cancer Father   . Heart attack Father   . Heart failure Father   . Diabetes Sister   . Aortic aneurysm Sister   . Heart attack Brother   . Asthma Son   . Sleep apnea Son   . Endometriosis Daughter     Social History:  reports that she has been smoking Cigarettes.  She has a 20 pack-year smoking history. She  has never used smokeless tobacco. She reports that she does not drink alcohol or use illicit drugs.  Allergies: No Known Allergies  Medications:   Medication List    ASK your doctor about these medications       ALPRAZolam 1 MG tablet  Commonly known as:  XANAX  Take 1 mg by mouth 3 (three) times daily.     amitriptyline 50 MG tablet  Commonly known as:  ELAVIL  Take 50 mg by mouth at bedtime.     aspirin 81 MG EC tablet  Take 81 mg by mouth daily.     buPROPion 150 MG 24 hr tablet  Commonly known as:  WELLBUTRIN XL  Take 150 mg by mouth daily.     clopidogrel 75 MG tablet  Commonly known as:  PLAVIX  TAKE 1 TABLET DAILY WITH BREAKFAST.     cycloSPORINE 0.05 % ophthalmic emulsion  Commonly known as:  RESTASIS  Place 1 drop into both eyes 2 (two) times daily.     diazepam 5 MG tablet  Commonly known as:  VALIUM  Take 5 mg by mouth every 6 (six) hours  as needed. For muscle spasms     furosemide 40 MG tablet  Commonly known as:  LASIX  As needed     metoprolol 50 MG tablet  Commonly known as:  LOPRESSOR  TAKE (1/2) TABLET BY MOUTH TWICE DAILY.     mupirocin ointment 2 %  Commonly known as:  BACTROBAN  Apply 1 application topically daily. As needed for leg infection.     NAPROXEN PO  Take by mouth 2 (two) times daily.     NEXIUM 40 MG capsule  Generic drug:  esomeprazole  Take 40 mg by mouth Twice daily.     NITROSTAT 0.4 MG SL tablet  Generic drug:  nitroGLYCERIN  DISSOLVE 1 TABLET UNDER TONGUE EVERY 5 MINUTES UP TO 15 MINUTES FOR CHEST PAIN. IF NO RELIEF CALL 911.     rosuvastatin 40 MG tablet  Commonly known as:  CRESTOR  Take 40 mg by mouth daily.     ROXICODONE 30 MG immediate release tablet  Generic drug:  oxycodone  Take 30 mg by mouth every 4 (four) hours as needed. For breakthrough pain     oxyCODONE 80 MG 12 hr tablet  Commonly known as:  OXYCONTIN  Take 80 mg by mouth 4 (four) times daily.     silver sulfADIAZINE 1 % cream  Commonly known as:  SILVADENE  Apply 1 application topically 2 (two) times daily. Apply to vulva surgical site.     sulfamethoxazole-trimethoprim 800-160 MG per tablet  Commonly known as:  BACTRIM DS  Take 1 tablet by mouth See admin instructions. Takes 1 tablet twice a day on Saturday and Sunday only.     VENTOLIN HFA 108 (90 BASE) MCG/ACT inhaler  Generic drug:  albuterol  Inhale 2 puffs into the lungs every 6 (six) hours as needed. For asthma/wheezing     vitamin B-12 1000 MCG tablet  Commonly known as:  CYANOCOBALAMIN  Take 1,000 mcg by mouth daily.       Please HPI for pertinent positives, otherwise complete 10 system ROS negative.  Physical Exam: BP 100/46  Pulse 59  Temp(Src) 98.2 F (36.8 C) (Oral)  Resp 20  Ht 5' 4.5" (1.638 m)  Wt 170 lb (77.111 kg)  BMI 28.74 kg/m2  SpO2 96% Body mass index is 28.74 kg/(m^2).  General Appearance:  Alert, cooperative, no  distress  Head:  Normocephalic, without obvious abnormality, atraumatic  Neck: Supple, symmetrical, trachea midline  Lungs:   Clear to auscultation bilaterally, no w/r/r, respirations unlabored without use of accessory muscles.  Chest Wall:  No tenderness or deformity  Heart:  Regular rate and rhythm, S1, S2 normal, no murmur, rub or gallop.  Abdomen:   Soft, non-tender, non distended, (+) BS  Extremities: Extremities trace edema B/L, atraumatic, no cyanosis   Neurologic: Normal affect, no gross deficits.   Results for orders placed during the hospital encounter of 09/28/13 (from the past 48 hour(s))  APTT     Status: Abnormal   Collection Time    09/28/13  1:01 PM      Result Value Ref Range   aPTT 23 (*) 24 - 37 seconds  CBC     Status: Abnormal (Preliminary result)   Collection Time    09/28/13  1:01 PM      Result Value Ref Range   WBC 6.6  4.0 - 10.5 K/uL   RBC 3.80 (*) 3.87 - 5.11 MIL/uL   Hemoglobin 12.5  12.0 - 15.0 g/dL   HCT 36.7  36.0 - 46.0 %   MCV 96.6  78.0 - 100.0 fL   MCH 32.9  26.0 - 34.0 pg   MCHC 34.1  30.0 - 36.0 g/dL   RDW 14.7  11.5 - 15.5 %   Platelets PENDING  150 - 400 K/uL  PROTIME-INR     Status: None   Collection Time    09/28/13  1:01 PM      Result Value Ref Range   Prothrombin Time 12.3  11.6 - 15.2 seconds   INR 0.93  0.00 - 1.49   No results found.  Assessment/Plan Left axillary lymphadenopathy hypermetabolic on PET 7/82/95 History of non-hodgkin's lymphoma Request for image guided biopsy. Patient has been NPO, held plavix 6 days, labs and images reviewed. Risks and Benefits discussed with the patient. All of the patient's questions were answered, patient is agreeable to proceed. Consent signed and in chart.   Tsosie Billing D PA-C 09/28/2013, 1:34 PM

## 2013-09-28 NOTE — Discharge Instructions (Signed)
Biopsy °Care After °Refer to this sheet in the next few weeks. These instructions provide you with information on caring for yourself after your procedure. Your caregiver may also give you more specific instructions. Your treatment has been planned according to current medical practices, but problems sometimes occur. Call your caregiver if you have any problems or questions after your procedure. °If you had a fine needle biopsy, you may have soreness at the biopsy site for 1 to 2 days. If you had an open biopsy, you may have soreness at the biopsy site for 3 to 4 days. °HOME CARE INSTRUCTIONS  °· You may resume normal diet and activities as directed. °· Change bandages (dressings) as directed. If your wound was closed with a skin glue (adhesive), it will wear off and begin to peel in 7 days. °· Only take over-the-counter or prescription medicines for pain, discomfort, or fever as directed by your caregiver. °· Ask your caregiver when you can bathe and get your wound wet. °SEEK IMMEDIATE MEDICAL CARE IF:  °· You have increased bleeding (more than a small spot) from the biopsy site. °· You notice redness, swelling, or increasing pain at the biopsy site. °· You have pus coming from the biopsy site. °· You have a fever. °· You notice a bad smell coming from the biopsy site or dressing. °· You have a rash, have difficulty breathing, or have any allergic problems. °MAKE SURE YOU:  °· Understand these instructions. °· Will watch your condition. °· Will get help right away if you are not doing well or get worse. °Document Released: 01/05/2005 Document Revised: 09/10/2011 Document Reviewed: 12/14/2010 °[employer]® Patient Information ©2014 [employer]. ° °

## 2013-10-20 ENCOUNTER — Encounter (HOSPITAL_COMMUNITY): Payer: Self-pay

## 2013-10-20 ENCOUNTER — Encounter (HOSPITAL_COMMUNITY): Payer: Medicare Other | Attending: Hematology and Oncology

## 2013-10-20 VITALS — BP 115/70 | HR 62 | Temp 97.8°F | Resp 18 | Ht 64.5 in | Wt 165.0 lb

## 2013-10-20 DIAGNOSIS — Z9221 Personal history of antineoplastic chemotherapy: Secondary | ICD-10-CM | POA: Insufficient documentation

## 2013-10-20 DIAGNOSIS — Z22322 Carrier or suspected carrier of Methicillin resistant Staphylococcus aureus: Secondary | ICD-10-CM | POA: Insufficient documentation

## 2013-10-20 DIAGNOSIS — J449 Chronic obstructive pulmonary disease, unspecified: Secondary | ICD-10-CM | POA: Insufficient documentation

## 2013-10-20 DIAGNOSIS — Z87898 Personal history of other specified conditions: Secondary | ICD-10-CM

## 2013-10-20 DIAGNOSIS — M810 Age-related osteoporosis without current pathological fracture: Secondary | ICD-10-CM | POA: Insufficient documentation

## 2013-10-20 DIAGNOSIS — E785 Hyperlipidemia, unspecified: Secondary | ICD-10-CM | POA: Insufficient documentation

## 2013-10-20 DIAGNOSIS — F17200 Nicotine dependence, unspecified, uncomplicated: Secondary | ICD-10-CM | POA: Insufficient documentation

## 2013-10-20 DIAGNOSIS — F329 Major depressive disorder, single episode, unspecified: Secondary | ICD-10-CM | POA: Insufficient documentation

## 2013-10-20 DIAGNOSIS — G8929 Other chronic pain: Secondary | ICD-10-CM | POA: Insufficient documentation

## 2013-10-20 DIAGNOSIS — C8294 Follicular lymphoma, unspecified, lymph nodes of axilla and upper limb: Secondary | ICD-10-CM

## 2013-10-20 DIAGNOSIS — F3289 Other specified depressive episodes: Secondary | ICD-10-CM | POA: Insufficient documentation

## 2013-10-20 DIAGNOSIS — M549 Dorsalgia, unspecified: Secondary | ICD-10-CM | POA: Insufficient documentation

## 2013-10-20 DIAGNOSIS — R918 Other nonspecific abnormal finding of lung field: Secondary | ICD-10-CM

## 2013-10-20 DIAGNOSIS — I509 Heart failure, unspecified: Secondary | ICD-10-CM | POA: Insufficient documentation

## 2013-10-20 DIAGNOSIS — I2510 Atherosclerotic heart disease of native coronary artery without angina pectoris: Secondary | ICD-10-CM | POA: Insufficient documentation

## 2013-10-20 DIAGNOSIS — I70229 Atherosclerosis of native arteries of extremities with rest pain, unspecified extremity: Secondary | ICD-10-CM

## 2013-10-20 DIAGNOSIS — I739 Peripheral vascular disease, unspecified: Secondary | ICD-10-CM | POA: Insufficient documentation

## 2013-10-20 DIAGNOSIS — Z8544 Personal history of malignant neoplasm of other female genital organs: Secondary | ICD-10-CM | POA: Insufficient documentation

## 2013-10-20 DIAGNOSIS — M129 Arthropathy, unspecified: Secondary | ICD-10-CM | POA: Insufficient documentation

## 2013-10-20 DIAGNOSIS — Z8673 Personal history of transient ischemic attack (TIA), and cerebral infarction without residual deficits: Secondary | ICD-10-CM | POA: Insufficient documentation

## 2013-10-20 DIAGNOSIS — F411 Generalized anxiety disorder: Secondary | ICD-10-CM | POA: Insufficient documentation

## 2013-10-20 DIAGNOSIS — R222 Localized swelling, mass and lump, trunk: Secondary | ICD-10-CM

## 2013-10-20 DIAGNOSIS — K219 Gastro-esophageal reflux disease without esophagitis: Secondary | ICD-10-CM | POA: Insufficient documentation

## 2013-10-20 DIAGNOSIS — D071 Carcinoma in situ of vulva: Secondary | ICD-10-CM

## 2013-10-20 DIAGNOSIS — C519 Malignant neoplasm of vulva, unspecified: Secondary | ICD-10-CM

## 2013-10-20 DIAGNOSIS — J4489 Other specified chronic obstructive pulmonary disease: Secondary | ICD-10-CM | POA: Insufficient documentation

## 2013-10-20 DIAGNOSIS — I10 Essential (primary) hypertension: Secondary | ICD-10-CM | POA: Insufficient documentation

## 2013-10-20 LAB — CBC WITH DIFFERENTIAL/PLATELET
BASOS PCT: 0 % (ref 0–1)
Basophils Absolute: 0 10*3/uL (ref 0.0–0.1)
Eosinophils Absolute: 0.1 10*3/uL (ref 0.0–0.7)
Eosinophils Relative: 1 % (ref 0–5)
HCT: 35.8 % — ABNORMAL LOW (ref 36.0–46.0)
Hemoglobin: 11.6 g/dL — ABNORMAL LOW (ref 12.0–15.0)
Lymphocytes Relative: 18 % (ref 12–46)
Lymphs Abs: 1.1 10*3/uL (ref 0.7–4.0)
MCH: 31.6 pg (ref 26.0–34.0)
MCHC: 32.4 g/dL (ref 30.0–36.0)
MCV: 97.5 fL (ref 78.0–100.0)
Monocytes Absolute: 0.2 10*3/uL (ref 0.1–1.0)
Monocytes Relative: 3 % (ref 3–12)
NEUTROS PCT: 78 % — AB (ref 43–77)
Neutro Abs: 4.9 10*3/uL (ref 1.7–7.7)
PLATELETS: 172 10*3/uL (ref 150–400)
RBC: 3.67 MIL/uL — ABNORMAL LOW (ref 3.87–5.11)
RDW: 14.2 % (ref 11.5–15.5)
WBC: 6.3 10*3/uL (ref 4.0–10.5)

## 2013-10-20 LAB — LACTATE DEHYDROGENASE: LDH: 177 U/L (ref 94–250)

## 2013-10-20 LAB — RETICULOCYTES
RBC.: 3.67 MIL/uL — ABNORMAL LOW (ref 3.87–5.11)
Retic Count, Absolute: 84.4 10*3/uL (ref 19.0–186.0)
Retic Ct Pct: 2.3 % (ref 0.4–3.1)

## 2013-10-20 LAB — COMPREHENSIVE METABOLIC PANEL
ALT: 11 U/L (ref 0–35)
AST: 17 U/L (ref 0–37)
Albumin: 3.4 g/dL — ABNORMAL LOW (ref 3.5–5.2)
Alkaline Phosphatase: 182 U/L — ABNORMAL HIGH (ref 39–117)
BILIRUBIN TOTAL: 0.2 mg/dL — AB (ref 0.3–1.2)
BUN: 23 mg/dL (ref 6–23)
CO2: 30 meq/L (ref 19–32)
Calcium: 9.5 mg/dL (ref 8.4–10.5)
Chloride: 96 mEq/L (ref 96–112)
Creatinine, Ser: 1.66 mg/dL — ABNORMAL HIGH (ref 0.50–1.10)
GFR calc Af Amer: 39 mL/min — ABNORMAL LOW (ref >90)
GFR, EST NON AFRICAN AMERICAN: 33 mL/min — AB (ref >90)
Glucose, Bld: 152 mg/dL — ABNORMAL HIGH (ref 70–99)
Potassium: 3.5 mEq/L — ABNORMAL LOW (ref 3.7–5.3)
SODIUM: 139 meq/L (ref 137–147)
Total Protein: 7 g/dL (ref 6.0–8.3)

## 2013-10-20 MED ORDER — HEPARIN SOD (PORK) LOCK FLUSH 100 UNIT/ML IV SOLN
500.0000 [IU] | Freq: Once | INTRAVENOUS | Status: AC
  Administered 2013-10-20: 500 [IU] via INTRAVENOUS
  Filled 2013-10-20: qty 5

## 2013-10-20 MED ORDER — SODIUM CHLORIDE 0.9 % IJ SOLN
10.0000 mL | INTRAMUSCULAR | Status: DC | PRN
  Administered 2013-10-20: 10 mL via INTRAVENOUS

## 2013-10-20 NOTE — Progress Notes (Signed)
[place] A. Barnet Glasgow, M.D.  NEW PATIENT EVALUATION   Name: [name] Date: 10/21/2013 MRN: [id number] DOB: 29-Dec-1955  PCP: [name], MD   REFERRING PHYSICIAN: [name], MD  REASON FOR REFERRAL: Left axillary low-grade follicular lymphoma     HISTORY OF PRESENT ILLNESS:[name] is a 58 y.o. female who is referred for evaluation of left axillary low-grade follicular lymphoma, status post biopsy. She has a history of COPD and severe peripheral vascular disease having undergone bilateral lower extremity stenting, carotid endarterectomy, and having suffered a cerebral thrombosis with left-sided weakness. She was diagnosed in 2202 with follicular lymphoma and was treated with R.-CHOP for 6 cycles followed by 2 years of Rituxan maintenance. She relapsed in 2009, details of treatment at that time are unknown to me. Recently she underwent a PET scan for restaging and was found to have an abnormality in the left axilla as well as a long period the left axillary core biopsy was consistent with recurrent follicular low-grade lymphoma. Chest findings are consistent with a probable primary lung carcinoma. She feels well with good appetite and no worsening cough or shortness of breath. She denies any fever, night sweats, easy satiety, lower extremity swelling or redness, PND, does have chronic cough without expectoration or hemoptysis. [name] here today with her brother who provided much of the past history in lieu of receiving the actual records.   PAST MEDICAL HISTORY:  has a past medical history of Coronary artery disease; Left ventricular dysfunction; Carotid artery disease; Non Hodgkin's lymphoma; Chronic back pain; Hyperlipidemia; Hypertension; COPD (chronic obstructive pulmonary disease); Shortness of breath; Cancer; Stroke; CHF (congestive heart failure); Blood transfusion; Arthritis; Myocardial infarction; Peripheral  vascular disease; GERD (gastroesophageal reflux disease); Anemia; Osteoporosis; Depression; Anxiety; Other malignant lymphomas, unspecified site, extranodal and solid organ sites (2004); Asthma; and Vulvar cancer.     PAST SURGICAL HISTORY: Past Surgical History  Procedure Laterality Date   Lumar surg     Back surgery      x 2   Lung biopsy     Cardiac catheterization     Abdominal aortogram  08/08/2011   Abdominal hysterectomy      partial   Portacath placement      pt says its for chemo only and its an old port    Dilation and curettage of uterus     Vulva /perineum biopsy  04/09/2012    Procedure: VULVAR BIOPSY;  Surgeon: Florian Buff, MD;  Location: [facility];  Service: Gynecology;  Laterality: N/A;   Vulvectomy  05/20/2012    Procedure: VULVECTOMY;  Surgeon: Alvino Chapel, MD;  Location: [facility];  Service: Gynecology;  Laterality: Bilateral;  BILATERAL SIMPLE VULVECTOMIES     CURRENT MEDICATIONS: has a current medication list which includes the following prescription(s): albuterol, alprazolam, amitriptyline, bupropion, clopidogrel, cyclosporine, diazepam, furosemide, metoprolol, mupirocin ointment, nexium, nitroglycerin, oxycodone, oxycodone, prednisone, rosuvastatin, sulfamethoxazole-trimethoprim, aspirin, naproxen, and vitamin b-12, and the following Facility-Administered Medications: sodium chloride.   ALLERGIES: Review of patient's allergies indicates no known allergies.   SOCIAL HISTORY:  reports that she has been smoking Cigarettes.  She has a 20 pack-year smoking history. She has never used smokeless tobacco. She reports that she does not drink alcohol or use illicit drugs.   FAMILY HISTORY: family history includes Aortic aneurysm in her sister; Asthma in her son; Coronary artery disease in an other family member; Diabetes in her sister;  Endometriosis in her daughter; Heart attack in her brother, father, and mother; Heart disease in her father and mother;  Heart failure in her father and mother; Kidney cancer in her father; Lung cancer in her mother; Sleep apnea in her son.    REVIEW OF SYSTEMS:  Other than that discussed above is noncontributory.    PHYSICAL EXAM:  height is 5' 4.5" (1.638 m) and weight is 165 lb (74.844 kg). Her oral temperature is 97.8 F (36.6 C). Her blood pressure is 115/70 and her pulse is 62. Her respiration is 18 and oxygen saturation is 97%.    GENERAL:alert, no distress and comfortable SKIN: skin color, texture, turgor are normal, no rashes or significant lesions EYES: normal, Conjunctiva are pink and non-injected, sclera clear OROPHARYNX:no exudate, no erythema and lips, buccal mucosa, and tongue normal  NECK: supple, thyroid normal size, non-tender, without nodularity. Left carotid endarterectomy scar. CHEST: Increased AP diameter with no breast masses. LYMPH:  no palpable lymphadenopathy in the cervical, axillary or inguinal. I cannot palpate any lymph nodes in the left axilla were or biopsy was done on the ultrasound. LUNGS: clear to auscultation and percussion with normal breathing effort HEART: regular rate & rhythm and no murmurs ABDOMEN:abdomen soft, non-tender and normal bowel sounds MUSCULOSKELETALl:no cyanosis of digits, no clubbing or edema  NEURO: alert & oriented x 3 with fluent speech, no focal motor/sensory deficits. DTRs normal.    LABORATORY DATA:  Office Visit on 10/20/2013  Component Date Value Ref Range Status   WBC 10/20/2013 6.3  4.0 - 10.5 K/uL Final   RBC 10/20/2013 3.67* 3.87 - 5.11 MIL/uL Final   Hemoglobin 10/20/2013 11.6* 12.0 - 15.0 g/dL Final   HCT 10/20/2013 35.8* 36.0 - 46.0 % Final   MCV 10/20/2013 97.5  78.0 - 100.0 fL Final   MCH 10/20/2013 31.6  26.0 - 34.0 pg Final   MCHC 10/20/2013 32.4  30.0 - 36.0 g/dL Final   RDW 10/20/2013 14.2  11.5 - 15.5 % Final   Platelets 10/20/2013 172  150 - 400 K/uL Final   Neutrophils Relative % 10/20/2013 78* 43 - 77 % Final     Lymphocytes Relative 10/20/2013 18  12 - 46 % Final   Monocytes Relative 10/20/2013 3  3 - 12 % Final   Eosinophils Relative 10/20/2013 1  0 - 5 % Final   Basophils Relative 10/20/2013 0  0 - 1 % Final   Neutro Abs 10/20/2013 4.9  1.7 - 7.7 K/uL Final   Lymphs Abs 10/20/2013 1.1  0.7 - 4.0 K/uL Final   Monocytes Absolute 10/20/2013 0.2  0.1 - 1.0 K/uL Final   Eosinophils Absolute 10/20/2013 0.1  0.0 - 0.7 K/uL Final   Basophils Absolute 10/20/2013 0.0  0.0 - 0.1 K/uL Final   WBC Morphology 10/20/2013 ATYPICAL LYMPHOCYTES   Final   Comment: INCREASED BANDS (>20% BANDS)                          VACUOLATED NEUTROPHILS   Smear Review 10/20/2013 LARGE PLATELETS PRESENT   Final   Retic Ct Pct 10/20/2013 2.3  0.4 - 3.1 % Final   RBC. 10/20/2013 3.67* 3.87 - 5.11 MIL/uL Final   Retic Count, Manual 10/20/2013 84.4  19.0 - 186.0 K/uL Final   Sodium 10/20/2013 139  137 - 147 mEq/L Final   Potassium 10/20/2013 3.5* 3.7 - 5.3 mEq/L Final   Chloride 10/20/2013 96  96 - 112 mEq/L Final  CO2 10/20/2013 30  19 - 32 mEq/L Final   Glucose, Bld 10/20/2013 152* 70 - 99 mg/dL Final   BUN 10/20/2013 23  6 - 23 mg/dL Final   Creatinine, Ser 10/20/2013 1.66* 0.50 - 1.10 mg/dL Final   Calcium 10/20/2013 9.5  8.4 - 10.5 mg/dL Final   Total Protein 10/20/2013 7.0  6.0 - 8.3 g/dL Final   Albumin 10/20/2013 3.4* 3.5 - 5.2 g/dL Final   AST 10/20/2013 17  0 - 37 U/L Final   ALT 10/20/2013 11  0 - 35 U/L Final   Alkaline Phosphatase 10/20/2013 182* 39 - 117 U/L Final   Total Bilirubin 10/20/2013 0.2* 0.3 - 1.2 mg/dL Final   GFR calc non Af Amer 10/20/2013 33* >90 mL/min Final   GFR calc Af Amer 10/20/2013 39* >90 mL/min Final   Comment: (NOTE)                          The eGFR has been calculated using the CKD EPI equation.                          This calculation has not been validated in all clinical situations.                          eGFR's persistently <90 mL/min signify  possible Chronic Kidney                          Disease.   LDH 10/20/2013 177  94 - 250 U/L Final  Hospital Outpatient Visit on 09/28/2013  Component Date Value Ref Range Status   aPTT 09/28/2013 23* 24 - 37 seconds Final   WBC 09/28/2013 6.6  4.0 - 10.5 K/uL Final   RBC 09/28/2013 3.80* 3.87 - 5.11 MIL/uL Final   Hemoglobin 09/28/2013 12.5  12.0 - 15.0 g/dL Final   HCT 09/28/2013 36.7  36.0 - 46.0 % Final   MCV 09/28/2013 96.6  78.0 - 100.0 fL Final   MCH 09/28/2013 32.9  26.0 - 34.0 pg Final   MCHC 09/28/2013 34.1  30.0 - 36.0 g/dL Final   RDW 09/28/2013 14.7  11.5 - 15.5 % Final   Platelets 09/28/2013 119* 150 - 400 K/uL Final   PLATELET COUNT CONFIRMED BY SMEAR   Prothrombin Time 09/28/2013 12.3  11.6 - 15.2 seconds Final   INR 09/28/2013 0.93  0.00 - 1.49 Final    Urinalysis    Component Value Date/Time   COLORURINE YELLOW 04/07/2012 [place] 04/07/2012 1045   LABSPEC 1.015 04/07/2012 1045   PHURINE 6.0 04/07/2012 [place] 04/07/2012 [place] 04/07/2012 [place] 04/07/2012 [place] 04/07/2012 1045   PROTEINUR NEGATIVE 04/07/2012 1045   UROBILINOGEN 0.2 04/07/2012 1045   NITRITE NEGATIVE 04/07/2012 1045   LEUKOCYTESUR NEGATIVE 04/07/2012 1045      _0 :  MM Digital Screening Status: Edited Result - FINAL            Addendum    Ander Gaster, MD Thu Jun 04, 2013 6:43:24 AM EST       ADDENDUM REPORT: 06/02/2013 15:53  RECOMMENDATIONS:  Screening mammogram in one year.(Code:[id number])  Electronically Signed  By: Willadean Carol M.D.  On: 06/02/2013 15:53       Study Result  CLINICAL DATA: Screening.  EXAM:  DIGITAL SCREENING BILATERAL MAMMOGRAM WITH CAD  COMPARISON: Previous exam(s)  ACR Breast Density Category a: The breast tissue is almost entirely  fatty.  FINDINGS:  There are no findings suspicious for malignancy. Images were  processed with CAD.    IMPRESSION:  No mammographic evidence of malignancy. A result letter of this  screening mammogram will be mailed directly to the patient.  BI-RADS CATEGORY 1: Negative  Electronically Signed:  By: Willadean Carol M.D.  On: 05/29/2013 10:15         CT Abdomen Pelvis Wo Contrast Status: Final result         PACS Images    Show images for CT Abdomen Pelvis Wo Contrast         Study Result    CLINICAL DATA: Lymphoma. Renal failure.  EXAM:  CT ABDOMEN AND PELVIS WITHOUT CONTRAST  TECHNIQUE:  Multidetector CT imaging of the abdomen and pelvis was performed  following the standard protocol without intravenous contrast.  COMPARISON: NM BONE WHOLE BODY dated 07/22/2012; CT CHEST-ABD-PELV  W/O CM dated 03/10/2012  FINDINGS:  Liver normal. Mild splenic prominence. No focal splenic  abnormalities. Pancreas normal. Biliary system unremarkable.  Adrenals normal. Kidneys normal. The bladder is unremarkable.  Hysterectomy. No free pelvic fluid collections.  No significant adenopathy. Aortoiliac atherosclerotic vascular  disease.  No inflammatory changes in the right or left lower quadrant. No  bowel distention. Esophagogastric and gastroduodenal regions are  normal. No free air. No mesenteric masses.  Small patchy density is noted in the right lower lobe. This could  represent a focal area of infiltrate. A follow-up chest CT in 1  month is suggested to demonstrate clearing. Heart size normal. No  acute bony abnormality.  IMPRESSION:  1. No significant intra-abdominal abnormality identified. No  evidence of lymphoma recurrence.  2. Small patchy infiltrate right lower lobe, pneumonia cannot be  excluded. Follow-up chest CT in 1 month is suggested to demonstrate  clearing.  Electronically Signed  By: Marcello Moores Register  On: 09/10/2013 10:00         NM PET Image Restag (PS) Skull Base To Thigh Status: Final result         PACS Images    Show images for NM PET Image  Restag (PS) Skull Base To Thigh         Study Result    CLINICAL DATA: Subsequent treatment strategy for lymphoma.  EXAM:  NUCLEAR MEDICINE PET SKULL BASE TO THIGH  TECHNIQUE:  9.1 mCi F-18 FDG was injected intravenously. Full-ring PET imaging  was performed from the skull base to thigh after the radiotracer. CT  data was obtained and used for attenuation correction and anatomic  localization.  FASTING BLOOD GLUCOSE: Value: 109 mg/dl  COMPARISON: NM BONE WHOLE BODY dated 07/22/2012  FINDINGS:  NECK  No hypermetabolic lymph nodes in the neck.  CHEST  There is a single hypermetabolic mildly enlarged left axillary lymph  node measuring 15 mm short axis with SUV max 6.9. No additional  hypermetabolic axial lymph nodes are present on the left or right.  No hypermetabolic mediastinal lymph nodes.  Within the right lower lobe there is a 12 mm nodule with mild to  moderate metabolic activity (SUV max 3.0). This is new from more  remote PET CT scan.  ABDOMEN/PELVIS  No abnormal hypermetabolic activity within the liver, pancreas,  adrenal glands, or spleen. No hypermetabolic lymph nodes in the  abdomen or pelvis.  SKELETON  No focal  hypermetabolic activity to suggest skeletal metastasis.  IMPRESSION:  1. Hypermetabolic left axillary lymph node is concerning for  lymphoma recurrence. Consider percutaneous biopsy.  2. Hypermetabolic right lower lobe pulmonary nodule is  indeterminate. Differential would include infection, primary lung  cancer, and less likely lymphoma. Recommend follow-up CT exam in  short interval (6 to 8 weeks) and lesions does not resolve consider  percutaneous biopsy.  3. No additional evidence of lymphoma recurrence.  Electronically Signed  By: Suzy Bouchard M.D.  On: 09/15/2013       US Biopsy  09/28/2013   CLINICAL DATA:  Abnormal left axillary lymph  EXAM: ULTRASOUND-GUIDED BIOPSY OF AN ENLARGED LEFT AXILLARY LYMPH NODE. CORE.  MEDICATIONS AND MEDICAL  HISTORY: Versed 2 mg, Fentanyl 100 mcg.  Additional Medications: None.  ANESTHESIA/SEDATION: Moderate sedation time: 10 minutes  PROCEDURE: The procedure, risks, benefits, and alternatives were explained to the patient. Questions regarding the procedure were encouraged and answered. The patient understands and consents to the procedure.  The left axilla was prepped with Betadine in a sterile fashion, and a sterile drape was applied covering the operative field. A sterile gown and sterile gloves were used for the procedure.  Under sonographic guidance, 5 18 gauge core biopsies of the enlarged left axillary lymph node were obtained. Final imaging was performed.  Patient tolerated the procedure well without complication. Vital sign monitoring by nursing staff during the procedure will continue as patient is in the special procedures unit for post procedure observation.  FINDINGS: The images document guide needle placement within the enlarged left axillary lymph node. Post biopsy images demonstrate no image.  IMPRESSION: Successful ultrasound-guided core biopsy of an enlarged left axillary lymph node.   Electronically Signed   By: Maryclare Bean M.D.   On: 09/28/2013 15:53    PATHOLOGY:  FINAL for SEFORA, TIETJE ([id number]) Patient: LORETTA, DOUTT Collected: 09/28/2013 Client: [facility] Accession: [id number] Received: 09/28/2013 Art Hoss DOB: 01-10-1956 Age: 28 Gender: F Reported: 09/30/2013 [address] Patient Ph: [phone] MRN #: [id number] [place], [place] [address] Visit #: [id number].[email or web address]-ABA0 Chart #: Phone:  Fax: CC: [name], MD REPORT OF SURGICAL PATHOLOGY FINAL DIAGNOSIS Diagnosis Lymph node, needle/core biopsy, LN - FOLLICULAR LYMPHOMA, LOW GRADE (GRADE 1-2 OF 3). - SEE ONCOLOGY TABLE. Diagnosis Note LYMPHOMA Histologic type: Follicular lymphoma. Grade (if applicable): Low grade (grade 1-2 of 3). Flow cytometry: [id number]. Monoclonal population of B-cell  with expression of CD10. Immunohistochemical stains: CD20, CD3, CD10, CD5, bcl-6, bcl-2 and Ki-67. Touch preps/imprints: N/A. Comments: The needle cores reveal lymphoid tissue with a vague nodular architecture. The nodules are composed of small lymphocytes with irregular nuclear contours. Only rare large centroblast like cells are seen, consistent with a low grade process. Immunohistochemistry reveals the ill-defined nodules of atypical lymphocytes which are positive for CD20, CD10, bcl-6, and bcl-2. Ki-67 reveals a low proliferation rate (10%) in the atypical areas. CD3 and CD5 highlight admixed T-cells. Flow cytometry ([id number]) reveals a monoclonal B-cell population with expression of CD10. Overall, these findings are consistent with a low grade follicular lymphoma. [name] was called on 09/30/2013. Vicente Males MD Pathologist, Electronic Signature (Case signed 09/30/2013) Specimen Gross and Clinical Information Specimen(s) Obtained: Lymph node, needle/core biopsy, LN Specimen Clinical Information (tl) 1 of 2 FINAL for NECHUMA, BOVEN [phone]) Gross In saline are three cores of tan soft tissue ranging from 0.6 x 0.1 cm to 1 x 0.1 cm. Half of one core is held in RPMI for possible flow  cytometry, with the remaining specimen submitted in one block for routine histology. (SSW:ecj 09/28/2013) Stain(s) used in Diagnosis: The following stain(s) were used in diagnosing the case: BCl 6 , CD 3, KI-67-NO ACIS, CD 20, CD-10, CD 5, BCl 2. The control(s) stained appropriately. Disclaimer Some of these immunohistochemical stains may have been developed and the performance characteristics determined by [facility]. Some may not have been cleared or approved by the U.S. Food and Drug Administration. The FDA has determined that such clearance or approval is not necessary. This test is used for clinical purposes. It should not be regarded as investigational or for research. This  laboratory is certified under the [place] (CLIA-88) as qualified to perform high complexity clinical laboratory testing. Report signed out from the following location(s) Technical Component performed at [place], [place], [place] [address]. CLIA #: [id number], Technical Component performed at [place], [place], [place] [address]. CLIA #: [id number], Interpretation performed at [place], [place], [place] [address]. CLIA #:  FINAL for JACQUILYN, SELDON ([id number]) Patient: LILLAH, STANDRE Collected: 09/28/2013 Client: [facility] [place] Accession: [id number] Received: 09/28/2013 Art Hoss DOB: 06-04-1956 Age: 75 Gender: F Reported: 09/30/2013 [address] Patient Ph: [phone] MRN #: [id number] [place], [place] [address] Visit #: [id number] Chart #: Phone:  Fax: CC: Sinda Du FLOW CYTOMETRY REPORT INTERPRETATION Interpretation Tissue-Flow Cytometry - MONOCLONAL B-CELL POPULATION IDENTIFIED, SEE COMMENT. Diagnosis Comment: There is monoclonal B-cell population with expression of CD10. This correlates with the findings seen in the lymph node ([id number]). Vicente Males MD Pathologist, Electronic Signature (Case signed 09/30/2013) GROSS AND MICROSCOPIC INFORMATION Source Tissue-Flow Cytometry Microscopic Gated population: Flow cytometric immunophenotyping is performed using antibiodies to the antigens listed in the table below. Electronic gates are placed around a cell cluster displaying light scatter properties corresponding to lymphocytes. - Abnormal Cells in gated population: 67 % - Phenotype of Abnormal Cells: CD10, CD19, CD20, CD21, CD22, CD23, HLA-Dr, Lambda 1 of 2 FINAL for SUSETTE, SEMINARA [phone]) Specimen Table Lymphoid Associated Myeloid Associated Misc. As CD2 neg CD19 pos CD11c ND CD45 ND CD3 neg CD20 pos  CD13 ND HLA-DR pos CD4 neg CD21 pos CD14 ND CD10 pos CD5 neg CD22 pos CD15 ND CD56/16 ND CD7 neg CD23 pos CD33 ND ZAP70 ND CD8 neg CD103 ND MPO ND CD34 ND CD25 ND FMC7 ND CD117 ND CD52 ND sKappa neg CD38 ND sLambda pos 7AAD tested cKappa ND cLambda ND Gross ref. [phone] All controls stained appropriately. The above tests were developed and their performance characteristics determined by the [facility]. They have not been cleared or approved by the U.S. Food and Drug Administration." Report signed from the following location(s) Interpretation performed at [place], [place], [place] [address]. CLIA #: [id number],  IMPRESSION:  #1. Recurrent well-differentiated lymphocytic lymphoma involving the left axilla by PET scan, status post core biopsy for tissue diagnosis, initially diagnosed in 2004 and treated with chemotherapy in 2004 and again in 2009 when recurrence was documented. #2. Abnormal PET scan in the lung, highly suspicious for primary lung carcinoma in the right lower lobe(12 mm). #3. Peripheral vascular disease, status post bilateral lower extremity stenting, carotid endarterectomy, and previous right cerebral thrombosis, minimal neurologic deficit. #4. History of VIN of the vulva, multicentric, with areas of microinvasive squamous cell carcinoma, status post total vulvectomy. #5. Chronic obstructive pulmonary disease, still smoking  one pack of cigarettes daily. #6. MRSA colonization.   PLAN:  #1. 75 old records her previous chemotherapy. #2. Full pulmonary function tests with DLCO. #3. Thoracic surgery consult for possible vast resection of the right lower lobe nodule #4. The patient was reassured about the recurrence of her follicular lymphoma. Will concentrate on the lung lesion initially and then discuss management of the lymphoma after decision is made regarding the management of the lung nodule. If no intervention is  recommended, the patient will be offered either localized radiation or salvage chemotherapy for lymphoma after reviewing what she had received in the past. #5. Both the patient and her brother who accompanied her today agreed to this strategy. #6. Followup in 4 weeks.  Farrel Gobble, MD 10/21/2013 6:39 AM   DISCLAIMER:  This note was dictated with voice recognition softwre.  Similar sounding words can inadvertently be transcribed inaccurately and may not be corrected upon review.

## 2013-10-20 NOTE — Progress Notes (Signed)
[name] presented for Portacath access and flush. Proper placement of portacath confirmed by CXR. Portacath located left chest wall accessed with  H 20 needle. Good blood return present. Portacath flushed with 37ml NS and 500U/50ml Heparin and needle removed intact. Procedure without incident. Patient tolerated procedure well.

## 2013-10-20 NOTE — Patient Instructions (Addendum)
[facility] Discharge Instructions  RECOMMENDATIONS MADE BY THE CONSULTANT AND ANY TEST RESULTS WILL BE SENT TO YOUR REFERRING PHYSICIAN.  EXAM FINDINGS BY THE PHYSICIAN TODAY AND SIGNS OR SYMPTOMS TO REPORT TO CLINIC OR PRIMARY PHYSICIAN: Exam and findings as discussed by Dr. Barnet Glasgow.  Need to check some blood work today and will flush your port a catheter as well. Will get you scheduled for Pulmonary Function Tests and a consultation with a thoracic surgeon following the Pulmonary Function Tests.  Will also try to get your treatment records from [facility].    MEDICATIONS PRESCRIBED:  none  INSTRUCTIONS/FOLLOW-UP: Tentative follow-up in 4 weeks. Port flushes every 6 weeks when port is not in use.  Thank you for choosing [facility] to provide your oncology and hematology care.  To afford each patient quality time with our providers, please arrive at least 15 minutes before your scheduled appointment time.  With your help, our goal is to use those 15 minutes to complete the necessary work-up to ensure our physicians have the information they need to help with your evaluation and healthcare recommendations.    Effective January 1st, 2014, we ask that you re-schedule your appointment with our physicians should you arrive 10 or more minutes late for your appointment.  We strive to give you quality time with our providers, and arriving late affects you and other patients whose appointments are after yours.    Again, thank you for choosing [facility].  Our hope is that these requests will decrease the amount of time that you wait before being seen by our physicians.       _____________________________________________________________  Should you have questions after your visit to [facility], please contact our office at [phone] between the hours of 8:30 a.m. and 5:00 p.m.  Voicemails left after 4:30 p.m. will  not be returned until the following business day.  For prescription refill requests, have your pharmacy contact our office with your prescription refill request.

## 2013-10-21 ENCOUNTER — Ambulatory Visit (HOSPITAL_COMMUNITY)
Admission: RE | Admit: 2013-10-21 | Discharge: 2013-10-21 | Disposition: A | Discharge status: Home / Self Care | Payer: Medicare Other | Source: Ambulatory Visit | Attending: Hematology and Oncology | Admitting: Hematology and Oncology

## 2013-10-21 DIAGNOSIS — R918 Other nonspecific abnormal finding of lung field: Secondary | ICD-10-CM

## 2013-10-21 DIAGNOSIS — R059 Cough, unspecified: Secondary | ICD-10-CM | POA: Insufficient documentation

## 2013-10-21 DIAGNOSIS — R0609 Other forms of dyspnea: Secondary | ICD-10-CM | POA: Insufficient documentation

## 2013-10-21 DIAGNOSIS — R05 Cough: Secondary | ICD-10-CM | POA: Insufficient documentation

## 2013-10-21 DIAGNOSIS — R222 Localized swelling, mass and lump, trunk: Secondary | ICD-10-CM | POA: Insufficient documentation

## 2013-10-21 DIAGNOSIS — R0989 Other specified symptoms and signs involving the circulatory and respiratory systems: Principal | ICD-10-CM | POA: Insufficient documentation

## 2013-10-21 LAB — PULMONARY FUNCTION TEST
DL/VA % pred: 77 %
DL/VA: 3.75 ml/min/mmHg/L
DLCO COR % PRED: 58 %
DLCO cor: 14.71 ml/min/mmHg
DLCO unc % pred: 58 %
DLCO unc: 14.71 ml/min/mmHg
FEF 25-75 POST: 0.83 L/s
FEF 25-75 PRE: 0.96 L/s
FEF2575-%CHANGE-POST: -13 %
FEF2575-%PRED-PRE: 37 %
FEF2575-%Pred-Post: 32 %
FEV1-%Change-Post: 0 %
FEV1-%Pred-Post: 65 %
FEV1-%Pred-Pre: 66 %
FEV1-PRE: 1.8 L
FEV1-Post: 1.78 L
FEV1FVC-%Change-Post: 6 %
FEV1FVC-%Pred-Pre: 82 %
FEV6-%CHANGE-POST: -4 %
FEV6-%PRED-POST: 75 %
FEV6-%Pred-Pre: 78 %
FEV6-Post: 2.54 L
FEV6-Pre: 2.65 L
FEV6FVC-%Change-Post: 2 %
FEV6FVC-%Pred-Post: 101 %
FEV6FVC-%Pred-Pre: 99 %
FVC-%Change-Post: -6 %
FVC-%Pred-Post: 74 %
FVC-%Pred-Pre: 79 %
FVC-POST: 2.57 L
FVC-Pre: 2.75 L
Post FEV1/FVC ratio: 69 %
Post FEV6/FVC ratio: 99 %
Pre FEV1/FVC ratio: 65 %
Pre FEV6/FVC Ratio: 96 %
RV % pred: 123 %
RV: 2.38 L
TLC % pred: 97 %
TLC: 4.98 L

## 2013-10-21 MED ORDER — ALBUTEROL SULFATE (2.5 MG/3ML) 0.083% IN NEBU
2.5000 mg | INHALATION_SOLUTION | Freq: Once | RESPIRATORY_TRACT | Status: AC
  Administered 2013-10-21: 2.5 mg via RESPIRATORY_TRACT

## 2013-10-22 LAB — BETA 2 MICROGLOBULIN, SERUM: Beta-2 Microglobulin: 5.72 mg/L — ABNORMAL HIGH (ref <=2.51)

## 2013-10-26 ENCOUNTER — Telehealth (HOSPITAL_COMMUNITY): Payer: Self-pay

## 2013-10-26 NOTE — Telephone Encounter (Signed)
Patient called and wanted "to know what was going on and why we weren't taking her cancer out", advised to follow up with her pmd.  Message sent to Dr.Formanek.  Pt/ verbalized understanding.

## 2013-11-03 ENCOUNTER — Encounter: Payer: Self-pay | Admitting: Thoracic Surgery (Cardiothoracic Vascular Surgery)

## 2013-11-03 ENCOUNTER — Institutional Professional Consult (permissible substitution) (INDEPENDENT_AMBULATORY_CARE_PROVIDER_SITE_OTHER): Payer: Medicare Other | Admitting: Thoracic Surgery (Cardiothoracic Vascular Surgery)

## 2013-11-03 VITALS — BP 117/66 | HR 100 | Resp 18 | Ht 64.5 in | Wt 162.0 lb

## 2013-11-03 DIAGNOSIS — C8589 Other specified types of non-Hodgkin lymphoma, extranodal and solid organ sites: Secondary | ICD-10-CM

## 2013-11-03 DIAGNOSIS — C8590 Non-Hodgkin lymphoma, unspecified, unspecified site: Secondary | ICD-10-CM

## 2013-11-03 DIAGNOSIS — D381 Neoplasm of uncertain behavior of trachea, bronchus and lung: Secondary | ICD-10-CM

## 2013-11-03 NOTE — Progress Notes (Signed)
PCP is [name], MD Referring Provider is [name], MD  Chief Complaint  Patient presents with  . Lung Lesion    eval for resection....CT, PET, PFT'S    HPI: 58 year old woman with a complex medical history including heavy tobacco abuse, COPD, non-Hodgkin's lymphoma, coronary artery disease, and peripheral arterial disease.  She has a history of heavy tobacco abuse, up to 3 packs a day starting at age 22. She says she currently is smoking about one half a pack per day. She was first diagnosed with non-Hodgkin's lymphoma back in 2004. She was treated with a good response. She relapsed and 2009 and was once again treated.  She recently had a PET CT as followup for her non-Hodgkin's lymphoma. It showed a hypermetabolic left axillary lymph node. There also was a hypermetabolic 1.5 cm right lower lobe some solid nodule with max SUV of 3.0. A core biopsy was done of the axillary lymph node and it showed recurrent low-grade follicular lymphoma.  She has a history of coronary disease as well as peripheral arterial disease. She says that she has chest pain on a fairly regular basis. There is not been any acceleration recently. She says she gets short of breath and has chest pain with walking up one flight of stairs or up any significant incline. She's had a cough which is nonproductive for the past 2 months, but is improving. She denies fevers or chills. She has gained weight. She says she's tried to stop smoking and in fact has quit at times in the past but her anxiety causes her to start smoking again.  She saw Dr. Barnet Glasgow. He advised dealing with the lung nodule before beginning treatment for her recurrent lymphoma.  Past Medical History  Diagnosis Date  . Coronary artery disease     status post stenting of the aortic and iliac vessels by Dr. Gwenlyn Found. Repeat aortic stenting 2010  . Left ventricular dysfunction     hx of with ejection fraction of 35% range.  . Carotid artery disease      hx of carotid cerebrovascular disease with 0-39% bilateral internal carotid artery setenosis   . Non Hodgkin's lymphoma   . Chronic back pain     status post lumbar surgery  . Hyperlipidemia   . Hypertension   . COPD (chronic obstructive pulmonary disease)   . Shortness of breath   . Cancer     hx of non hogdin lymphoma  . Stroke   . CHF (congestive heart failure)   . Blood transfusion   . Arthritis   . Myocardial infarction   . Peripheral vascular disease   . GERD (gastroesophageal reflux disease)   . Anemia   . Osteoporosis   . Depression   . Anxiety   . Other malignant lymphomas, unspecified site, extranodal and solid organ sites 2004    Back  . Asthma   . Vulvar cancer     Past Surgical History  Procedure Laterality Date  . [facility] surg    . Back surgery      x 2  . Lung biopsy    . Cardiac catheterization    . Abdominal aortogram  08/08/2011  . Abdominal hysterectomy      partial  . Portacath placement      pt says its for chemo only and its an old port   . Dilation and curettage of uterus    . Vulva /perineum biopsy  04/09/2012    Procedure: VULVAR BIOPSY;  Surgeon:  Luther H Eure, MD;  Location: [facility];  Service: Gynecology;  Laterality: N/A;  . Vulvectomy  05/20/2012    Procedure: VULVECTOMY;  Surgeon: Daniel L Clarke-Pearson, MD;  Location: [facility];  Service: Gynecology;  Laterality: Bilateral;  BILATERAL SIMPLE VULVECTOMIES    Family History  Problem Relation Age of Onset  . Coronary artery disease      positive for vascular and cardiac disease  . Heart disease Mother   . Lung cancer Mother   . Heart attack Mother   . Heart failure Mother   . Heart disease Father   . Kidney cancer Father   . Heart attack Father   . Heart failure Father   . Diabetes Sister   . Aortic aneurysm Sister   . Heart attack Brother   . Asthma Son   . Sleep apnea Son   . Endometriosis Daughter     Social History History  Substance Use Topics  . Smoking status:  Current Every Day Smoker -- 2.50 packs/day for 40 years    Types: Cigarettes  . Smokeless tobacco: Never Used  . Alcohol Use: No    Current Outpatient Prescriptions  Medication Sig Dispense Refill  . albuterol (VENTOLIN HFA) 108 (90 BASE) MCG/ACT inhaler Inhale 2 puffs into the lungs every 6 (six) hours as needed. For asthma/wheezing      . ALPRAZolam (XANAX) 1 MG tablet Take 1 mg by mouth 3 (three) times daily.       . amitriptyline (ELAVIL) 50 MG tablet Take 50 mg by mouth at bedtime.      . aspirin 81 MG EC tablet Take 81 mg by mouth daily.        . buPROPion (WELLBUTRIN XL) 150 MG 24 hr tablet Take 150 mg by mouth daily.      . clopidogrel (PLAVIX) 75 MG tablet Take 75 mg by mouth daily with breakfast.      . cycloSPORINE (RESTASIS) 0.05 % ophthalmic emulsion Place 1 drop into both eyes 2 (two) times daily.      . diazepam (VALIUM) 5 MG tablet Take 5 mg by mouth every 6 (six) hours as needed. For muscle spasms      . furosemide (LASIX) 40 MG tablet Take 40 mg by mouth daily as needed for fluid.       . metoprolol (LOPRESSOR) 50 MG tablet Take 25 mg by mouth 2 (two) times daily.      . mupirocin ointment (BACTROBAN) 2 % Apply 1 application topically daily. As needed for leg infection.      . NEXIUM 40 MG capsule Take 40 mg by mouth Twice daily.      . nitroGLYCERIN (NITROSTAT) 0.4 MG SL tablet Place 0.4 mg under the tongue every 5 (five) minutes as needed for chest pain.      . oxyCODONE (OXYCONTIN) 80 MG 12 hr tablet Take 80 mg by mouth 4 (four) times daily.      . oxycodone (ROXICODONE) 30 MG immediate release tablet Take 30 mg by mouth every 4 (four) hours as needed. For breakthrough pain      . rosuvastatin (CRESTOR) 40 MG tablet Take 40 mg by mouth daily.        . sulfamethoxazole-trimethoprim (BACTRIM DS) 800-160 MG per tablet Take 1 tablet by mouth See admin instructions. Takes 1 tablet twice a day on Saturday and Sunday only.      . vitamin B-12 (CYANOCOBALAMIN) 1000 MCG tablet  Take 1,000 mcg by mouth daily.         No current facility-administered medications for this visit.    No Known Allergies  Review of Systems  Constitutional: Positive for appetite change and fatigue. Negative for fever and unexpected weight change.  Respiratory: Positive for cough (Productive, no hemoptysis), chest tightness, shortness of breath and wheezing.   Cardiovascular: Positive for chest pain.    BP 117/66  Pulse 100  Resp 18  Ht 5' 4.5" (1.638 m)  Wt 162 lb (73.483 kg)  BMI 27.39 kg/m2  SpO2 94% Physical Exam  Vitals reviewed. Constitutional: [name] oriented to person, place, and time. No distress.  58 yo woman who appears much older than her stated age  HENT:  Head: Normocephalic and atraumatic.  Eyes: EOM are normal. Pupils are equal, round, and reactive to light.  Neck: Neck supple. No thyromegaly present.  Cardiovascular: Normal rate, regular rhythm and normal heart sounds.   No murmur heard. Pulmonary/Chest: She has wheezes (faint in right base).  Small palpable left axillary node Well healed left thoracotomy incision  Abdominal: Soft. There is no tenderness.  Musculoskeletal: She exhibits no edema.  Lymphadenopathy:    She has no cervical adenopathy.  Neurological: [name] alert and oriented to person, place, and time. No cranial nerve deficit.  Skin: Skin is warm and dry.     Diagnostic Tests: NUCLEAR MEDICINE PET SKULL BASE TO THIGH  TECHNIQUE:  9.1 mCi F-18 FDG was injected intravenously. Full-ring PET imaging  was performed from the skull base to thigh after the radiotracer. CT  data was obtained and used for attenuation correction and anatomic  localization.  FASTING BLOOD GLUCOSE: Value: 109 mg/dl  COMPARISON: NM BONE WHOLE BODY dated 07/22/2012  FINDINGS:  NECK  No hypermetabolic lymph nodes in the neck.  CHEST  There is a single hypermetabolic mildly enlarged left axillary lymph  node measuring 15 mm short axis with SUV max 6.9. No additional   hypermetabolic axial lymph nodes are present on the left or right.  No hypermetabolic mediastinal lymph nodes.  Within the right lower lobe there is a 12 mm nodule with mild to  moderate metabolic activity (SUV max 3.0). This is new from more  remote PET CT scan.  ABDOMEN/PELVIS  No abnormal hypermetabolic activity within the liver, pancreas,  adrenal glands, or spleen. No hypermetabolic lymph nodes in the  abdomen or pelvis.  SKELETON  No focal hypermetabolic activity to suggest skeletal metastasis.  IMPRESSION:  1. Hypermetabolic left axillary lymph node is concerning for  lymphoma recurrence. Consider percutaneous biopsy.  2. Hypermetabolic right lower lobe pulmonary nodule is  indeterminate. Differential would include infection, primary lung  cancer, and less likely lymphoma. Recommend follow-up CT exam in  short interval (6 to 8 weeks) and lesions does not resolve consider  percutaneous biopsy.  3. No additional evidence of lymphoma recurrence.  Electronically Signed  By: Suzy Bouchard M.D.  On: 09/15/2013 17:28   Pulmonary function testing FVC 2.75 (79%), 2.57 post bronchodilator FEV1 1.80 (66%), 1.78 post bronchodilator TLC 4.98 DLCO 58% Consistent with COPD with no significant response to bronchodilators  Impression: 58 year old woman with a history of heavy tobacco abuse, COPD, recurrent non-Hodgkin's lymphoma, and severe atherosclerotic cardiovascular disease. She now is been found to have a 1.5 cm sub-solid nodule in the posterior aspect of the right lower lobe. This is hypermetabolic by PET with an SUV of 3. This has been considered a lung cancer was to be proven otherwise. The only definitively deal with this lesion is to do the excisional biopsy  for definitive diagnosis.  I recommended that she have a right video-assisted thoracoscopy, wedge resection, and then possible lower lobectomy or segmentectomy if the lesion is cancerous on frozen section. She previously  had a left video-assisted thoracoscopy and wedge resections by Dr. Arlyce Dice back in 2009 for bilateral cavitary nodules. These were not malignant.  [name] familiar with the procedure. However I still discussed with her the indications, risks, benefits, and alternatives. We discussed the general nature of the procedure including the incisions to be used, the need for general anesthesia, the use of the chest tube postoperatively, expected hospital stay, and overall recovery. She understands the risk include, but are not limited to death, MI, stroke, DVT, PE, infection, bleeding, possible need for transfusion, prolonged air leak, cardiac arrhythmias, as well as the possibility of unforeseeable complications.  She accepts the risk and wishes to proceed.  [name] at high risk for cardiovascular complications and does have a history of coronary artery disease. She does have angina. She saw Dr. Burt Knack in December. I do think we need formal cardiology clearance given her active anginal symptoms, even they are not progressive.  I strongly advised her to quit smoking before surgery. I have no hope that she will do so.  Plan: Obtain cardiology clearance  Right VATS, wedge resection, possible lobectomy or segmentectomy

## 2013-11-05 ENCOUNTER — Encounter: Payer: Self-pay | Admitting: Cardiovascular Disease

## 2013-11-05 NOTE — Telephone Encounter (Signed)
This encounter was created in error - please disregard.

## 2013-11-05 NOTE — Telephone Encounter (Signed)
New message    Patient calling would like to speak with nurse . Will go into details when she calls.

## 2013-11-06 ENCOUNTER — Encounter: Payer: Self-pay | Admitting: Cardiovascular Disease

## 2013-11-06 ENCOUNTER — Telehealth: Payer: Self-pay | Admitting: Cardiovascular Disease

## 2013-11-06 ENCOUNTER — Ambulatory Visit (INDEPENDENT_AMBULATORY_CARE_PROVIDER_SITE_OTHER): Payer: Medicare Other | Admitting: Cardiovascular Disease

## 2013-11-06 VITALS — BP 116/80 | HR 69 | Ht 64.5 in | Wt 170.4 lb

## 2013-11-06 DIAGNOSIS — I70229 Atherosclerosis of native arteries of extremities with rest pain, unspecified extremity: Secondary | ICD-10-CM

## 2013-11-06 DIAGNOSIS — E785 Hyperlipidemia, unspecified: Secondary | ICD-10-CM

## 2013-11-06 DIAGNOSIS — I2510 Atherosclerotic heart disease of native coronary artery without angina pectoris: Secondary | ICD-10-CM

## 2013-11-06 DIAGNOSIS — R079 Chest pain, unspecified: Secondary | ICD-10-CM

## 2013-11-06 DIAGNOSIS — I10 Essential (primary) hypertension: Secondary | ICD-10-CM

## 2013-11-06 MED ORDER — ISOSORBIDE MONONITRATE ER 30 MG PO TB24: 15.0000 mg | ORAL_TABLET | Freq: Every day | ORAL | Status: DC

## 2013-11-06 NOTE — Telephone Encounter (Signed)
11-06-13  Patient needs a dobutamine myoview next week, but, no openings.  Looking for an opening and will call patient when available. ST

## 2013-11-06 NOTE — Progress Notes (Signed)
HPI:  58 year old woman presenting for preoperative evaluation. The patient has been followed for severe peripheral arterial disease. She was treated with bilateral iliac stents in February 2013. She had critical limb ischemia on the right. She has been a long-term smoker. She also has coronary artery disease with chronic occlusion of the RCA and left to right collaterals.  The patient was seen earlier this week by Dr Roxan Hockey for evaluation of a hypermetabolic lung lesion in the right lower lobe. She has a hx of recurrent non-Hodgkin's lymphoma. [name] tentatively scheduled to have a right video-assisted thoracoscopy, wedge resection, and then possible lower lobectomy or segmentectomy if the lesion is cancerous on frozen section. She presents for preop cardiac evaluation.  She describes exertional angina with about one block of walking. She also has dyspnea with exertion. Her anginal equivalent is substernal pressure in the chest. There has been no recent change in exertional symptoms. However, she did have an episode last week of chest pain and shortness of breath. She took 3 nitroglycerin and used her inhaler with resolution of symptoms. [name] not sure which therapy helped her more. She has episodes like this about once per month. She denies any recent change in the pattern of her angina. She has not had orthopnea or PND. She continues to smoke.   Outpatient Encounter Prescriptions as of 11/06/2013  Medication Sig  . albuterol (VENTOLIN HFA) 108 (90 BASE) MCG/ACT inhaler Inhale 2 puffs into the lungs every 6 (six) hours as needed. For asthma/wheezing  . ALPRAZolam (XANAX) 1 MG tablet Take 1 mg by mouth 3 (three) times daily.   [name] amitriptyline (ELAVIL) 50 MG tablet Take 50 mg by mouth at bedtime.  [name] aspirin 81 MG EC tablet Take 81 mg by mouth daily.    [name] buPROPion (WELLBUTRIN XL) 150 MG 24 hr tablet Take 150 mg by mouth daily.  . clopidogrel (PLAVIX) 75 MG tablet Take 75 mg by mouth daily  with breakfast.  . cycloSPORINE (RESTASIS) 0.05 % ophthalmic emulsion Place 1 drop into both eyes 2 (two) times daily.  . diazepam (VALIUM) 5 MG tablet Take 5 mg by mouth every 6 (six) hours as needed. For muscle spasms  . furosemide (LASIX) 40 MG tablet Take 40 mg by mouth daily as needed for fluid.   . metoprolol (LOPRESSOR) 50 MG tablet Take 25 mg by mouth 2 (two) times daily.  . mupirocin ointment (BACTROBAN) 2 % Apply 1 application topically daily. As needed for leg infection.  [name] NEXIUM 40 MG capsule Take 40 mg by mouth Twice daily.  . nitroGLYCERIN (NITROSTAT) 0.4 MG SL tablet Place 0.4 mg under the tongue every 5 (five) minutes as needed for chest pain.  [name] oxyCODONE (OXYCONTIN) 80 MG 12 hr tablet Take 80 mg by mouth 4 (four) times daily.  [name] oxycodone (ROXICODONE) 30 MG immediate release tablet Take 30 mg by mouth every 4 (four) hours as needed. For breakthrough pain  . rosuvastatin (CRESTOR) 40 MG tablet Take 40 mg by mouth daily.    [name] sulfamethoxazole-trimethoprim (BACTRIM DS) 800-160 MG per tablet Take 1 tablet by mouth See admin instructions. Takes 1 tablet twice a day on Saturday and Sunday only.  . vitamin B-12 (CYANOCOBALAMIN) 1000 MCG tablet Take 1,000 mcg by mouth daily.    No Known Allergies  Past Medical History  Diagnosis Date  . Coronary artery disease     status post stenting of the aortic and iliac vessels by Dr. Gwenlyn Found. Repeat aortic  stenting 2010  . Left ventricular dysfunction     hx of with ejection fraction of 35% range.  . Carotid artery disease     hx of carotid cerebrovascular disease with 0-39% bilateral internal carotid artery setenosis   . Non Hodgkin's lymphoma   . Chronic back pain     status post lumbar surgery  . Hyperlipidemia   . Hypertension   . COPD (chronic obstructive pulmonary disease)   . Shortness of breath   . Cancer     hx of non hogdin lymphoma  . Stroke   . CHF (congestive heart failure)   . Blood transfusion   . Arthritis   .  Myocardial infarction   . Peripheral vascular disease   . GERD (gastroesophageal reflux disease)   . Anemia   . Osteoporosis   . Depression   . Anxiety   . Other malignant lymphomas, unspecified site, extranodal and solid organ sites 2004    Back  . Asthma   . Vulvar cancer     ROS: Positive for leg pain, cough, generalized weakness, urinary incontinence, otherwise negative except as per HPI  BP 116/80  Pulse 69  Ht 5' 4.5" (1.638 m)  Wt 170 lb 6.4 oz (77.293 kg)  BMI 28.81 kg/m2  PHYSICAL EXAM: Pt is alert and oriented, overweight woman in NAD HEENT: normal Neck: JVP - normal, carotids 2+= without bruits Lungs: Coarse breath sounds bilaterally CV: RRR without murmur or gallop Abd: soft, NT, Positive BS, obese Ext: Mild edema on the right, none on the left. Feet are warm bilaterally. Pedal pulses are diminished Skin: Dry, scaly  EKG:  Normal sinus rhythm 69 beats per minute, within normal limits.  Cardiac Cath 01/05/2009: FINDINGS: Coronary angiography: Left main coronary artery is widely  patent. It bifurcates into the LAD and left circumflex.  LAD: The LAD is a large-caliber vessel that courses down and reaches  the LV apex. There was no significant stenosis present. There are two  small diagonal branches present. The septal perforators of the LAD  supply collaterals to the distal right coronary artery.  Left circumflex: The left circumflex is widely patent. It supplies two  OM branches. There was no significant stenosis throughout.  Right coronary artery: The right coronary artery is subtotaled. There  was a long diffuse severe stenosis throughout the midportion of the  vessel. Distally, the posterolateral branch and PDA fill from antegrade  flow as well as left-to-right collaterals.  ASSESSMENT AND PLAN:  1. Coronary atherosclerosis, native vessel. The patient has CCS class 2-3 symptoms of angina. Her symptoms are clearly significant, albeit stable. I think she  would benefit from the addition of isosorbide mononitrate 15 mg daily. She will continue on her beta blocker. Her perioperative risk of cardiac events is at least moderately increased. I think she should have a Myoview stress test for risk stratification. She has known occlusion of the right coronary artery with left-to-right collaterals. As long as her stress test is not high risk, I would anticipate clearing her for surgery but accepting the fact that her risk will be moderately increased. However, it seems that making a tissue diagnosis is imperative and I think the risk/benefit is favorable. She will need to hold aspirin and Plavix before surgery as directed by Dr. Roxan Hockey. Will provide final surgical clearance after her nuclear stress test. Will use dobutamine considering her lung problems.  2. Peripheral arterial disease. Overall appears stable. Not particularly limited by leg pain at this time. She  has undergone multiple stenting procedures, most recently in February 2013 when she was treated with covered stents in both iliac arteries.  3. COPD with ongoing tobacco use. She has been counseled extensively about tobacco cessation over the years. Underlying anxiety has been a major barrier to cessation.  Sherren Mocha 11/06/2013 8:11 AM

## 2013-11-06 NOTE — Patient Instructions (Addendum)
Your physician has requested that you have a dobutamine myoview. For furth information please visit [email or web address]. Please follow instruction sheet, as given. THIS IS NEEDED ASAP FOR SURGICAL CLEARANCE  Your physician has recommended you make the following change in your medication: START Isosorbide MN 30mg  take one-half tablet by mouth daily

## 2013-11-10 ENCOUNTER — Encounter (HOSPITAL_COMMUNITY): Payer: Medicare Other | Attending: Hematology and Oncology

## 2013-11-10 ENCOUNTER — Encounter (HOSPITAL_COMMUNITY): Payer: Self-pay

## 2013-11-10 ENCOUNTER — Other Ambulatory Visit (HOSPITAL_COMMUNITY): Payer: Self-pay | Admitting: Hematology and Oncology

## 2013-11-10 VITALS — BP 96/58 | HR 62 | Temp 98.5°F | Resp 18 | Wt 176.8 lb

## 2013-11-10 DIAGNOSIS — C8294 Follicular lymphoma, unspecified, lymph nodes of axilla and upper limb: Secondary | ICD-10-CM

## 2013-11-10 DIAGNOSIS — Z8544 Personal history of malignant neoplasm of other female genital organs: Secondary | ICD-10-CM

## 2013-11-10 DIAGNOSIS — J449 Chronic obstructive pulmonary disease, unspecified: Secondary | ICD-10-CM

## 2013-11-10 DIAGNOSIS — J8482 Adult pulmonary Langerhans cell histiocytosis: Secondary | ICD-10-CM | POA: Insufficient documentation

## 2013-11-10 DIAGNOSIS — I2510 Atherosclerotic heart disease of native coronary artery without angina pectoris: Secondary | ICD-10-CM

## 2013-11-10 DIAGNOSIS — R911 Solitary pulmonary nodule: Secondary | ICD-10-CM

## 2013-11-10 DIAGNOSIS — Z87898 Personal history of other specified conditions: Secondary | ICD-10-CM | POA: Insufficient documentation

## 2013-11-10 DIAGNOSIS — C519 Malignant neoplasm of vulva, unspecified: Secondary | ICD-10-CM

## 2013-11-10 DIAGNOSIS — R918 Other nonspecific abnormal finding of lung field: Secondary | ICD-10-CM

## 2013-11-10 HISTORY — DX: Adult pulmonary Langerhans cell histiocytosis: J84.82

## 2013-11-10 NOTE — Progress Notes (Signed)
[facility]  OFFICE PROGRESS NOTE  [name], MD [place] [address]  DIAGNOSIS: LEFT VENTRICULAR FUNCTION, DECREASED  Mass of lung, right upper lobe  lymphoma, recurrence - Plan: CBC with Differential  Vulvar cancer  Langerhans cell histiocytosis of lung, 2009  Chief Complaint  Patient presents with   Recurrent follicular mixed lymphoma left axilla   Lung Mass    CURRENT THERAPY: Undergoing workup for lung mass in addition to recurrent left axillary lymphoma.  INTERVAL HISTORY: [name] 58 y.o. female returns for followup after additional consultation with thoracic surgery regarding lung mass in the setting of recurrent follicular lymphoma involving the left axilla.  [name] given of tobacco cigarette smoking but his smoking e-cigarettes. She still has chronic cough and denies any fever or night sweats. Appetite is good with regular bowel movements. She denies any melena, medication, hematuria, vaginal bleeding, lower extremity swelling or redness, skin rash, headache, or seizures. Back pain persists controlled with OxyContin.   MEDICAL HISTORY: Past Medical History  Diagnosis Date   Coronary artery disease     status post stenting of the aortic and iliac vessels by Dr. Gwenlyn Found. Repeat aortic stenting 2010   Left ventricular dysfunction     hx of with ejection fraction of 35% range.   Carotid artery disease     hx of carotid cerebrovascular disease with 0-39% bilateral internal carotid artery setenosis    Non Hodgkin's lymphoma    Chronic back pain     status post lumbar surgery   Hyperlipidemia    Hypertension    COPD (chronic obstructive pulmonary disease)    Shortness of breath    Cancer     hx of non hogdin lymphoma   Stroke    CHF (congestive heart failure)    Blood transfusion    Arthritis    Myocardial infarction    Peripheral vascular disease     GERD (gastroesophageal reflux disease)    Anemia    Osteoporosis    Depression    Anxiety    Other malignant lymphomas, unspecified site, extranodal and solid organ sites 2004    Back   Asthma    Vulvar cancer     INTERIM HISTORY: has HYPERLIPIDEMIA; HYPERTENSION, BENIGN; CAD, NATIVE VESSEL; LEFT VENTRICULAR FUNCTION, DECREASED; CAROTID ARTERY DISEASE; AORTIC ATHEROSCLEROSIS; ATHEROSCLEROSIS, RENAL ARTERY; Atherosclerosis of native arteries of the extremities with rest pain; PVD; COPD; NON-HODGKIN'S LYMPHOMA, HX OF; Vulvar intraepithelial neoplasia III (VIN III); Vulvar cancer; Mass of lung, right upper lobe; and Langerhans cell histiocytosis of lung, 2009 on her problem list.   In September of 2004 the patient  presented with a several month history of left-sided back pain and was seen in the emergency room at which time a paravertebral left-sided mass at T3 was seen. A core biopsy was consistent with follicular non-Hodgkin's lymphoma with some elements of large cell. PET CT scan revealed mediastinal lymph nodes. Treated with 4 cycles of CHOP-R. started on 03/22/2003 through 05/25/2003 with vincristine dose decreased for the last 2 cycles due to peripheral neuropathy. She received Rituxan maintenance at 2 month intervals for one year and beginning in March of 2006 Rituxan was given at 3 month intervals with treatment ending in December 2006.  She relapsed with a mass at T7 presenting again with back pain at that time treated with Cytoxan and Fludara along with Rituxan starting in July of 2009. Because of  myelotoxicity Cytoxan was held for one cycle with repeat study showing no evidence of disease but with persistent thrombocytopenia in the 50-80,000 range. Her paraspinal mass disappeared.. Repeat CT scans on 03/10/2012 showed no evidence of lymphoma. Sometime in 2009, the patient developed pulmonary lesions and was operated on by Dr. Arlyce Dice at which time Langerhans histiocytosis was diagnosed.  Patient was treated with prednisone tapering him 60 mg down to 10 mg a day continued till July of 2013. PET scan was ordered to restage the patient in 2015 with findings of abnormality in the right upper lobe of the lung as well as the left axilla. Left axilla biopsy was consistent with follicular lymphoma and the patient is currently being evaluated by thoracic surgery for resection of the pulmonary nodule which appears to be a primary lung cancer.  ALLERGIES:  has No Known Allergies.  MEDICATIONS: has a current medication list which includes the following prescription(s): albuterol, alprazolam, amitriptyline, aspirin, bupropion, cyclosporine, diazepam, furosemide, isosorbide mononitrate, metoprolol, mupirocin ointment, nexium, nitroglycerin, oxycodone, oxycodone, rosuvastatin, sulfamethoxazole-trimethoprim, clopidogrel, and vitamin b-12.  SURGICAL HISTORY:  Past Surgical History  Procedure Laterality Date   Lumar surg     Back surgery      x 2   Lung biopsy     Cardiac catheterization     Abdominal aortogram  08/08/2011   Abdominal hysterectomy      partial   Portacath placement      pt says its for chemo only and its an old port    Dilation and curettage of uterus     Vulva /perineum biopsy  04/09/2012    Procedure: VULVAR BIOPSY;  Surgeon: Florian Buff, MD;  Location: [facility];  Service: Gynecology;  Laterality: N/A;   Vulvectomy  05/20/2012    Procedure: VULVECTOMY;  Surgeon: Alvino Chapel, MD;  Location: [facility];  Service: Gynecology;  Laterality: Bilateral;  BILATERAL SIMPLE VULVECTOMIES    FAMILY HISTORY: family history includes Aortic aneurysm in her sister; Asthma in her son; Coronary artery disease in an other family member; Diabetes in her sister; Endometriosis in her daughter; Heart attack in her brother, father, and mother; Heart disease in her father and mother; Heart failure in her father and mother; Kidney cancer in her father; Lung cancer in her mother;  Sleep apnea in her son.  SOCIAL HISTORY:  reports that she has been smoking Cigarettes.  She has a 100 pack-year smoking history. She has never used smokeless tobacco. She reports that she does not drink alcohol or use illicit drugs.  REVIEW OF SYSTEMS:  Other than that discussed above is noncontributory.  PHYSICAL EXAMINATION: ECOG PERFORMANCE STATUS: 1 - Symptomatic but completely ambulatory  Blood pressure 96/58, pulse 62, temperature 98.5 F (36.9 C), resp. rate 18, weight 176 lb 12.8 oz (80.196 kg).  GENERAL:alert, no distress and comfortable SKIN: skin color, texture, turgor are normal, no rashes or significant lesions EYES: PERLA; Conjunctiva are pink and non-injected, sclera clear SINUSES: No redness or tenderness over maxillary or ethmoid sinuses OROPHARYNX:no exudate, no erythema on lips, buccal mucosa, or tongue. NECK: supple, thyroid normal size, non-tender, without nodularity. No masses CHEST:  increased AP diameter with light port in place.  LYMPH:  no palpable lymphadenopathy in the cervical, axillary or inguinal. Left axillary lymph nodes are nonpalpable. LUNGS: clear to auscultation and percussion with normal breathing effort HEART: regular rate & rhythm and no murmurs. ABDOMEN:abdomen soft, non-tender and normal bowel sounds MUSCULOSKELETAL:no cyanosis of digits and no clubbing. Range of motion  normal.  NEURO: alert & oriented x 3 with fluent speech, no focal motor/sensory deficits   LABORATORY DATA: Hospital Outpatient Visit on 10/21/2013  Component Date Value Ref Range Status   FVC-Pre 10/21/2013 2.75   Final   FVC-%Pred-Pre 10/21/2013 79   Final   FVC-Post 10/21/2013 2.57   Final   FVC-%Pred-Post 10/21/2013 74   Final   FVC-%Change-Post 10/21/2013 -6   Final   FEV1-Pre 10/21/2013 1.80   Final   FEV1-%Pred-Pre 10/21/2013 66   Final   FEV1-Post 10/21/2013 1.78   Final   FEV1-%Pred-Post 10/21/2013 65   Final   FEV1-%Change-Post 10/21/2013 0   Final     FEV6-Pre 10/21/2013 2.65   Final   FEV6-%Pred-Pre 10/21/2013 78   Final   FEV6-Post 10/21/2013 2.54   Final   FEV6-%Pred-Post 10/21/2013 75   Final   FEV6-%Change-Post 10/21/2013 -4   Final   Pre FEV1/FVC ratio 10/21/2013 65   Final   FEV1FVC-%Pred-Pre 10/21/2013 82   Final   Post FEV1/FVC ratio 10/21/2013 69   Final   FEV1FVC-%Change-Post 10/21/2013 6   Final   Pre FEV6/FVC Ratio 10/21/2013 96   Final   FEV6FVC-%Pred-Pre 10/21/2013 99   Final   Post FEV6/FVC ratio 10/21/2013 99   Final   FEV6FVC-%Pred-Post 10/21/2013 101   Final   FEV6FVC-%Change-Post 10/21/2013 2   Final   FEF 25-75 Pre 10/21/2013 0.96   Final   FEF2575-%Pred-Pre 10/21/2013 37   Final   FEF 25-75 Post 10/21/2013 0.83   Final   FEF2575-%Pred-Post 10/21/2013 32   Final   FEF2575-%Change-Post 10/21/2013 -13   Final   RV 10/21/2013 2.38   Final   RV % pred 10/21/2013 123   Final   TLC 10/21/2013 4.98   Final   TLC % pred 10/21/2013 97   Final   DLCO unc 10/21/2013 14.71   Final   DLCO unc % pred 10/21/2013 58   Final   DLCO cor 10/21/2013 14.71   Final   DLCO cor % pred 10/21/2013 58   Final   DL/VA 10/21/2013 3.75   Final   DL/VA % pred 10/21/2013 77   Final  Office Visit on 10/20/2013  Component Date Value Ref Range Status   WBC 10/20/2013 6.3  4.0 - 10.5 K/uL Final   RBC 10/20/2013 3.67* 3.87 - 5.11 MIL/uL Final   Hemoglobin 10/20/2013 11.6* 12.0 - 15.0 g/dL Final   HCT 10/20/2013 35.8* 36.0 - 46.0 % Final   MCV 10/20/2013 97.5  78.0 - 100.0 fL Final   MCH 10/20/2013 31.6  26.0 - 34.0 pg Final   MCHC 10/20/2013 32.4  30.0 - 36.0 g/dL Final   RDW 10/20/2013 14.2  11.5 - 15.5 % Final   Platelets 10/20/2013 172  150 - 400 K/uL Final   Neutrophils Relative % 10/20/2013 78* 43 - 77 % Final   Lymphocytes Relative 10/20/2013 18  12 - 46 % Final   Monocytes Relative 10/20/2013 3  3 - 12 % Final   Eosinophils Relative 10/20/2013 1  0 - 5 % Final   Basophils Relative  10/20/2013 0  0 - 1 % Final   Neutro Abs 10/20/2013 4.9  1.7 - 7.7 K/uL Final   Lymphs Abs 10/20/2013 1.1  0.7 - 4.0 K/uL Final   Monocytes Absolute 10/20/2013 0.2  0.1 - 1.0 K/uL Final   Eosinophils Absolute 10/20/2013 0.1  0.0 - 0.7 K/uL Final   Basophils Absolute 10/20/2013 0.0  0.0 - 0.1 K/uL Final  WBC Morphology 10/20/2013 ATYPICAL LYMPHOCYTES   Final   Comment: INCREASED BANDS (>20% BANDS)                          VACUOLATED NEUTROPHILS   Smear Review 10/20/2013 LARGE PLATELETS PRESENT   Final   Retic Ct Pct 10/20/2013 2.3  0.4 - 3.1 % Final   RBC. 10/20/2013 3.67* 3.87 - 5.11 MIL/uL Final   Retic Count, Manual 10/20/2013 84.4  19.0 - 186.0 K/uL Final   Sodium 10/20/2013 139  137 - 147 mEq/L Final   Potassium 10/20/2013 3.5* 3.7 - 5.3 mEq/L Final   Chloride 10/20/2013 96  96 - 112 mEq/L Final   CO2 10/20/2013 30  19 - 32 mEq/L Final   Glucose, Bld 10/20/2013 152* 70 - 99 mg/dL Final   BUN 10/20/2013 23  6 - 23 mg/dL Final   Creatinine, Ser 10/20/2013 1.66* 0.50 - 1.10 mg/dL Final   Calcium 10/20/2013 9.5  8.4 - 10.5 mg/dL Final   Total Protein 10/20/2013 7.0  6.0 - 8.3 g/dL Final   Albumin 10/20/2013 3.4* 3.5 - 5.2 g/dL Final   AST 10/20/2013 17  0 - 37 U/L Final   ALT 10/20/2013 11  0 - 35 U/L Final   Alkaline Phosphatase 10/20/2013 182* 39 - 117 U/L Final   Total Bilirubin 10/20/2013 0.2* 0.3 - 1.2 mg/dL Final   GFR calc non Af Amer 10/20/2013 33* >90 mL/min Final   GFR calc Af Amer 10/20/2013 39* >90 mL/min Final   Comment: (NOTE)                          The eGFR has been calculated using the CKD EPI equation.                          This calculation has not been validated in all clinical situations.                          eGFR's persistently <90 mL/min signify possible Chronic Kidney                          Disease.   LDH 10/20/2013 177  94 - 250 U/L Final   Beta-2 Microglobulin 10/20/2013 5.72* <=2.51 mg/L Final   Performed at [facility]: FINAL for KELY, DOHN ([id number]) Patient: Shan Levans Collected: 09/28/2013 Client: [employer] Accession: [id number] Received: 09/28/2013 Art Hoss DOB: [birth year] Age: 63 Gender: F Reported: 09/30/2013 [address] Patient Ph: [phone] MRN #: [id number] [place], [place] [address] Visit #: [id number] Chart #: Phone:  Fax: CC: [name], MD REPORT OF SURGICAL PATHOLOGY FINAL DIAGNOSIS Diagnosis Lymph node, needle/core biopsy, LN - FOLLICULAR LYMPHOMA, LOW GRADE (GRADE 1-2 OF 3). - SEE ONCOLOGY TABLE. Diagnosis Note LYMPHOMA Histologic type: Follicular lymphoma. Grade (if applicable): Low grade (grade 1-2 of 3). Flow cytometry: [id number]. Monoclonal population of B-cell with expression of CD10. Immunohistochemical stains: CD20, CD3, CD10, CD5, bcl-6, bcl-2 and Ki-67. Touch preps/imprints: N/A. Comments: The needle cores reveal lymphoid tissue with a vague nodular architecture. The nodules are composed of small lymphocytes with irregular nuclear contours. Only rare large centroblast like cells are seen, consistent with a low grade process. Immunohistochemistry reveals the ill-defined nodules of atypical lymphocytes which are positive  for CD20, CD10, bcl-6, and bcl-2. Ki-67 reveals a low proliferation rate (10%) in the atypical areas. CD3 and CD5 highlight admixed T-cells. Flow cytometry ([id number]) reveals a monoclonal B-cell population with expression of CD10. Overall, these findings are consistent with a low grade follicular lymphoma. [name] was called on 09/30/2013. Vicente Males MD Pathologist, Electronic Signature (Case signed 09/30/2013) Specimen Gross and Clinical Information Specimen(s) Obtained: Lymph node, needle/core biopsy, LN Specimen Clinical Information (tl) 1 of 2 FINAL for SAUMYA, HUKILL K [phone]) Gross In saline are three cores of tan soft tissue ranging from 0.6 x 0.1 cm to 1 x  0.1 cm. Half of one core is held in RPMI for possible flow cytometry, with the remaining specimen submitted in one block for routine histology. (SSW:ecj 09/28/2013) Stain(s) used in Diagnosis: The following stain(s) were used in diagnosing the case: BCl 6 , CD 3, [id number]-[id number] ACIS, CD 20, CD-10, CD 5, BCl 2. The control(s) stained appropriately. Disclaimer Some of these immunohistochemical stains may have been developed and the performance characteristics determined by [facility]. Some may not have been cleared or approved by the U.S. Food and Drug Administration. The FDA has determined that such clearance or approval is not necessary. This test is used for clinical purposes. It should not be regarded as investigational or for research. This laboratory is certified under the [employer] (CLIA-88) as qualified to perform high complexity clinical laboratory testing. Report signed out from the following location(s) Technical Component performed at [facility], [place], [place] [address]. CLIA #: [id number], Technical Component performed at [facility], [place], [place] [address]. CLIA #: [id number], Interpretation performed at [facility], [place], [place] [address]. CLIA #: [id number], Urinalysis    Component Value Date/Time   COLORURINE YELLOW 04/07/2012 1045   APPEARANCEUR CLEAR 04/07/2012 1045   LABSPEC 1.015 04/07/2012 1045   PHURINE 6.0 04/07/2012 [place] 04/07/2012 [place] 04/07/2012 [place] 04/07/2012 [place] 04/07/2012 1045   PROTEINUR NEGATIVE 04/07/2012 1045   UROBILINOGEN 0.2 04/07/2012 1045   NITRITE NEGATIVE 04/07/2012 1045   LEUKOCYTESUR NEGATIVE 04/07/2012 1045    RADIOGRAPHIC STUDIES: No results found.  ASSESSMENT:  #1. Recurrent follicular lymphoma vomit left axilla,  stable. See interim history for details of previous therapy. #2. Right lower lobe lung nodule suggestive of malignancy with a previous history of Langerhans histiocytosis of the lung in 2009. This was treated with prednisone for a long period of time. #3. Chronic obstructive pulmonary disease #4. Peripheral vascular disease, status post bilateral lower 70 standing, carotid endarterectomy, with a history of previous right cerebral thrombosis with minimal to no neurologic deficit. #5. History of VIN vulva, multicentric with areas of microinvasive squamous cell carcinoma, status post total vulvectomy. #6. MRSA colonization.    PLAN:  #1. Cardiology consult with Dr. Burt Knack. #2. VATS resection of right lower lobe nodule. #3. Followup in 6 weeks.    All questions were answered. The patient knows to call the clinic with any problems, questions or concerns. We can certainly see the patient much sooner if necessary.   I spent 25 minutes counseling the patient face to face. The total time spent in the appointment was 30 minutes.    Farrel Gobble, MD 11/10/2013 1:56 PM  DISCLAIMER:  This note was dictated with voice recognition software.  Similar sounding words can inadvertently be  transcribed inaccurately and may not be corrected upon review.

## 2013-11-10 NOTE — Patient Instructions (Signed)
[facility] Discharge Instructions  RECOMMENDATIONS MADE BY THE CONSULTANT AND ANY TEST RESULTS WILL BE SENT TO YOUR REFERRING PHYSICIAN.  EXAM FINDINGS BY THE PHYSICIAN TODAY AND SIGNS OR SYMPTOMS TO REPORT TO CLINIC OR PRIMARY PHYSICIAN: Exam and findings as discussed by Dr. Barnet Glasgow.  Would like for you to stop smoking tobacco and the e-cigarette.  That is very important to make it safer for you to have surgery.  MEDICATIONS PRESCRIBED:  none  INSTRUCTIONS/FOLLOW-UP: Follow-up with port flush as scheduled and office visit in 6 weeks.  Thank you for choosing [facility] to provide your oncology and hematology care.  To afford each patient quality time with our providers, please arrive at least 15 minutes before your scheduled appointment time.  With your help, our goal is to use those 15 minutes to complete the necessary work-up to ensure our physicians have the information they need to help with your evaluation and healthcare recommendations.    Effective January 1st, 2014, we ask that you re-schedule your appointment with our physicians should you arrive 10 or more minutes late for your appointment.  We strive to give you quality time with our providers, and arriving late affects you and other patients whose appointments are after yours.    Again, thank you for choosing [facility].  Our hope is that these requests will decrease the amount of time that you wait before being seen by our physicians.       _____________________________________________________________  Should you have questions after your visit to [facility], please contact our office at [phone] between the hours of 8:30 a.m. and 5:00 p.m.  Voicemails left after 4:30 p.m. will not be returned until the following business day.  For prescription refill requests, have your pharmacy contact our office with your prescription refill request.

## 2013-11-18 ENCOUNTER — Ambulatory Visit (HOSPITAL_COMMUNITY): Payer: Medicare Other | Attending: Internal Medicine | Admitting: Radiology

## 2013-11-18 VITALS — BP 105/60 | HR 64 | Ht 64.0 in | Wt 173.0 lb

## 2013-11-18 DIAGNOSIS — R0609 Other forms of dyspnea: Secondary | ICD-10-CM | POA: Insufficient documentation

## 2013-11-18 DIAGNOSIS — J4489 Other specified chronic obstructive pulmonary disease: Secondary | ICD-10-CM | POA: Insufficient documentation

## 2013-11-18 DIAGNOSIS — R0989 Other specified symptoms and signs involving the circulatory and respiratory systems: Secondary | ICD-10-CM | POA: Insufficient documentation

## 2013-11-18 DIAGNOSIS — I739 Peripheral vascular disease, unspecified: Secondary | ICD-10-CM | POA: Insufficient documentation

## 2013-11-18 DIAGNOSIS — Z8249 Family history of ischemic heart disease and other diseases of the circulatory system: Secondary | ICD-10-CM | POA: Insufficient documentation

## 2013-11-18 DIAGNOSIS — J449 Chronic obstructive pulmonary disease, unspecified: Secondary | ICD-10-CM | POA: Insufficient documentation

## 2013-11-18 DIAGNOSIS — I2510 Atherosclerotic heart disease of native coronary artery without angina pectoris: Secondary | ICD-10-CM | POA: Insufficient documentation

## 2013-11-18 DIAGNOSIS — I252 Old myocardial infarction: Secondary | ICD-10-CM | POA: Insufficient documentation

## 2013-11-18 DIAGNOSIS — F17200 Nicotine dependence, unspecified, uncomplicated: Secondary | ICD-10-CM | POA: Insufficient documentation

## 2013-11-18 DIAGNOSIS — I4891 Unspecified atrial fibrillation: Secondary | ICD-10-CM | POA: Insufficient documentation

## 2013-11-18 DIAGNOSIS — R079 Chest pain, unspecified: Secondary | ICD-10-CM

## 2013-11-18 DIAGNOSIS — I779 Disorder of arteries and arterioles, unspecified: Secondary | ICD-10-CM | POA: Insufficient documentation

## 2013-11-18 DIAGNOSIS — I10 Essential (primary) hypertension: Secondary | ICD-10-CM | POA: Insufficient documentation

## 2013-11-18 MED ORDER — REGADENOSON 0.4 MG/5ML IV SOLN
0.4000 mg | Freq: Once | INTRAVENOUS | Status: AC
  Administered 2013-11-18: 0.4 mg via INTRAVENOUS

## 2013-11-18 MED ORDER — TECHNETIUM TC 99M SESTAMIBI GENERIC - CARDIOLITE
33.0000 | Freq: Once | INTRAVENOUS | Status: AC | PRN
  Administered 2013-11-18: 33 via INTRAVENOUS

## 2013-11-18 MED ORDER — TECHNETIUM TC 99M SESTAMIBI GENERIC - CARDIOLITE
10.8000 | Freq: Once | INTRAVENOUS | Status: AC | PRN
  Administered 2013-11-18: 11 via INTRAVENOUS

## 2013-11-18 NOTE — Progress Notes (Signed)
[facility] SITE 3 NUCLEAR MED [place], [place] [address] [phone]    Cardiology Nuclear Med Study  [name] is a 58 y.o. female     MRN : [id number]     DOB: [birth year]  Procedure Date: 11/18/2013  Nuclear Med Background Indication for Stress Test:  Evaluation for Ischemia, and Pending Surgical Clearance for  Lung Lesion possible Lobectomy, or segmentectomy by Dr. Modesto Charon, MD History:  CAD, MI, Cath (chronic RCA occl./collateral), hx. Afib, [facility] 2013 EF 55-65%, MPI 2003 (normal) EF 56%, Asthma, COPD Cardiac Risk Factors: Carotid Disease, CVA, Family History - CAD, Hypertension, Lipids, PVD and Smoker  Symptoms:  Chest Pain with Exertion and DOE   Nuclear Pre-Procedure Caffeine/Decaff Intake:  None> 12 hrs NPO After: 7:30pm   Lungs:  clear O2 Sat: 93% on room air. IV 0.9% NS with Angio Cath:  24g  IV Site: R Hand x 1, tolerated well IV Started by:  Irven Baltimore, RN  Chest Size (in):  36 Cup Size: C  Height: 5\' 4"  (1.626 m)  Weight:  173 lb (78.472 kg)  BMI:  Body mass index is 29.68 kg/(m^2). Tech Comments: Patient took  Lopressor this am.    Nuclear Med Study 1 or 2 day study: 1 day  Stress Test Type:  Lexiscan  Reading MD: N/A  Order Authorizing Provider:  Sherren Mocha, MD  Resting Radionuclide: Technetium 41m Sestamibi  Resting Radionuclide Dose: 11.0 mCi   Stress Radionuclide:  Technetium 67m Sestamibi  Stress Radionuclide Dose: 33.0 mCi           Stress Protocol Rest HR: 64 Stress HR: 76  Rest BP: 105/60 Stress BP: 108/63  Exercise Time (min): n/a METS: n/a           Dose of Adenosine (mg):  n/a Dose of Lexiscan: 0.4 mg  Dose of Atropine (mg): n/a Dose of Dobutamine: n/a mcg/kg/min (at max HR)  Stress Test Technologist: Glade Lloyd, BS-ES  Nuclear Technologist:  Vedia Pereyra, CNMT     Rest Procedure:  Myocardial perfusion imaging was performed at rest 45 minutes following the intravenous administration of  Technetium 67m Sestamibi. Rest ECG: NSR - Normal EKG  Stress Procedure:  The patient received IV Lexiscan 0.4 mg over 15-seconds.  Technetium 92m Sestamibi injected at 30-seconds.  Quantitative spect images were obtained after a 45 minute delay.  During the infusion of Lexiscan, the patient complained of SOB, headache and dry throat.  These symptoms began to resolve in recovery.  Stress ECG: No significant change from baseline ECG  QPS Raw Data Images:  There is interference from nuclear activity from structures below the diaphragm. This does limit the ability to read the study. Stress Images:  There is decreased uptake in the inferior wall. Rest Images:  Comparison with the stress images reveals no significant change. Subtraction (SDS):  No evidence of ischemia. Transient Ischemic Dilatation (Normal <1.22):  1.08 Lung/Heart Ratio (Normal <0.45):  0.34  Quantitative Gated Spect Images QGS EDV:  135 ml QGS ESV:  71 ml  Impression Exercise Capacity:  Lexiscan with no exercise. BP Response:  Normal blood pressure response. Clinical Symptoms:  No significant symptoms noted. ECG Impression:  No significant ECG changes with Lexiscan. Comparison with Prior Nuclear Study: the 2003 study described normal perfusion and LVEF  Overall Impression:  Low risk stress nuclear study with an inferior wall scar. It is difficult to completely exclude reversible ischemia in the inferior wall due to splanchnic  uptake interference, but the majority of the defect appears fixed.  LV Ejection Fraction: 47%.  LV Wall Motion:  inferior wall hypokinesis and mildly reduced overall systolic function.   Sanda Klein, MD, [facility] [phone] office [phone] pager

## 2013-11-24 ENCOUNTER — Other Ambulatory Visit: Payer: Self-pay | Admitting: *Deleted

## 2013-11-24 DIAGNOSIS — R911 Solitary pulmonary nodule: Secondary | ICD-10-CM

## 2013-11-30 ENCOUNTER — Encounter (HOSPITAL_COMMUNITY): Payer: Self-pay

## 2013-11-30 ENCOUNTER — Encounter (HOSPITAL_COMMUNITY)
Admission: RE | Admit: 2013-11-30 | Discharge: 2013-11-30 | Disposition: A | Discharge status: Home / Self Care | Payer: Medicare Other | Source: Ambulatory Visit | Attending: Thoracic Surgery (Cardiothoracic Vascular Surgery) | Admitting: Thoracic Surgery (Cardiothoracic Vascular Surgery)

## 2013-11-30 VITALS — BP 100/58 | HR 71 | Temp 98.0°F | Resp 18 | Ht 64.5 in | Wt 168.2 lb

## 2013-11-30 DIAGNOSIS — R911 Solitary pulmonary nodule: Secondary | ICD-10-CM

## 2013-11-30 HISTORY — DX: Cardiac arrhythmia, unspecified: I49.9

## 2013-11-30 HISTORY — DX: Unspecified convulsions: R56.9

## 2013-11-30 HISTORY — DX: Angina pectoris, unspecified: I20.9

## 2013-11-30 HISTORY — DX: Personal history of urinary calculi: Z87.442

## 2013-11-30 HISTORY — DX: Pneumonia, unspecified organism: J18.9

## 2013-11-30 LAB — PROTIME-INR
INR: 1.07 (ref 0.00–1.49)
Prothrombin Time: 13.7 seconds (ref 11.6–15.2)

## 2013-11-30 LAB — BLOOD GAS, ARTERIAL
ACID-BASE EXCESS: 2.8 mmol/L — AB (ref 0.0–2.0)
Bicarbonate: 28.1 mEq/L — ABNORMAL HIGH (ref 20.0–24.0)
DRAWN BY: 206361
FIO2: 0.21 %
O2 SAT: 94.6 %
PCO2 ART: 53.8 mmHg — AB (ref 35.0–45.0)
Patient temperature: 98.6
TCO2: 29.8 mmol/L (ref 0–100)
pH, Arterial: 7.338 — ABNORMAL LOW (ref 7.350–7.450)
pO2, Arterial: 75 mmHg — ABNORMAL LOW (ref 80.0–100.0)

## 2013-11-30 LAB — CBC
HEMATOCRIT: 36.9 % (ref 36.0–46.0)
Hemoglobin: 12.3 g/dL (ref 12.0–15.0)
MCH: 31.5 pg (ref 26.0–34.0)
MCHC: 33.3 g/dL (ref 30.0–36.0)
MCV: 94.4 fL (ref 78.0–100.0)
PLATELETS: 159 10*3/uL (ref 150–400)
RBC: 3.91 MIL/uL (ref 3.87–5.11)
RDW: 15.4 % (ref 11.5–15.5)
WBC: 4.7 10*3/uL (ref 4.0–10.5)

## 2013-11-30 LAB — URINALYSIS, ROUTINE W REFLEX MICROSCOPIC
Bilirubin Urine: NEGATIVE
Glucose, UA: NEGATIVE mg/dL
Hgb urine dipstick: NEGATIVE
KETONES UR: NEGATIVE mg/dL
LEUKOCYTES UA: NEGATIVE
Nitrite: NEGATIVE
PH: 6 (ref 5.0–8.0)
PROTEIN: NEGATIVE mg/dL
Specific Gravity, Urine: 1.012 (ref 1.005–1.030)
Urobilinogen, UA: 1 mg/dL (ref 0.0–1.0)

## 2013-11-30 LAB — COMPREHENSIVE METABOLIC PANEL
ALT: 10 U/L (ref 0–35)
AST: 16 U/L (ref 0–37)
Albumin: 3.5 g/dL (ref 3.5–5.2)
Alkaline Phosphatase: 157 U/L — ABNORMAL HIGH (ref 39–117)
BUN: 13 mg/dL (ref 6–23)
CO2: 25 meq/L (ref 19–32)
CREATININE: 1.59 mg/dL — AB (ref 0.50–1.10)
Calcium: 9.2 mg/dL (ref 8.4–10.5)
Chloride: 101 mEq/L (ref 96–112)
GFR calc Af Amer: 41 mL/min — ABNORMAL LOW (ref >90)
GFR, EST NON AFRICAN AMERICAN: 35 mL/min — AB (ref >90)
Glucose, Bld: 107 mg/dL — ABNORMAL HIGH (ref 70–99)
Potassium: 4.4 mEq/L (ref 3.7–5.3)
Sodium: 139 mEq/L (ref 137–147)
Total Bilirubin: 0.4 mg/dL (ref 0.3–1.2)
Total Protein: 6.8 g/dL (ref 6.0–8.3)

## 2013-11-30 LAB — TYPE AND SCREEN
ABO/RH(D): O POS
Antibody Screen: NEGATIVE

## 2013-11-30 LAB — APTT: APTT: 31 s (ref 24–37)

## 2013-11-30 LAB — SURGICAL PCR SCREEN
MRSA, PCR: NEGATIVE
Staphylococcus aureus: NEGATIVE

## 2013-11-30 NOTE — Progress Notes (Addendum)
req'd notes, any est from dr Royetta Asal  [facility] [phone](patient stated he said "she needs to be on dialysis")

## 2013-11-30 NOTE — Pre-Procedure Instructions (Addendum)
[name]  11/30/2013   Your procedure is scheduled on:  12/02/13  Report to [facility] short stay admitting at 630 AM.  Call this number if you have problems the morning of surgery: [phone]   Remember:   Do not eat food or drink liquids after midnight.   Take these medicines the morning of surgery with A SIP OF WATER: inhaler.pain med. Eye drops, valium/xanax  if needed, isosorbide,metoprolol,nexium      Take all meds as ordered until day  Of surgery except as instructed below or per dr     [name] all herbel meds, nsaids (aleve,naproxen,advil,ibuprofen) now including vitamins      Stop aspirin,and plavix per dr   [name] not wear jewelry, make-up or nail polish.  Do not wear lotions, powders, or perfumes. You may wear deodorant.  Do not shave 48 hours prior to surgery. Men may shave face and neck.  Do not bring valuables to the hospital.  [facility] is not responsible                  for any belongings or valuables.               Contacts, dentures or bridgework may not be worn into surgery.  Leave suitcase in the car. After surgery it may be brought to your room.  For patients admitted to the hospital, discharge time is determined by your                treatment team.               Patients discharged the day of surgery will not be allowed to drive  home.  Name and phone number of your driver:   Special Instructions:  Special Instructions: [name] - Preparing for Surgery  Before surgery, you can play an important role.  Because skin is not sterile, your skin needs to be as free of germs as possible.  You can reduce the number of germs on you skin by washing with CHG (chlorahexidine gluconate) soap before surgery.  CHG is an antiseptic cleaner which kills germs and bonds with the skin to continue killing germs even after washing.  Please DO NOT use if you have an allergy to CHG or antibacterial soaps.  If your skin becomes reddened/irritated stop using the CHG and inform your  nurse when you arrive at Short Stay.  Do not shave (including legs and underarms) for at least 48 hours prior to the first CHG shower.  You may shave your face.  Please follow these instructions carefully:   1.  Shower with CHG Soap the night before surgery and the morning of Surgery.  2.  If you choose to wash your hair, wash your hair first as usual with your normal shampoo.  3.  After you shampoo, rinse your hair and body thoroughly to remove the Shampoo.  4.  Use CHG as you would any other liquid soap.  You can apply chg directly  to the skin and wash gently with scrungie or a clean washcloth.  5.  Apply the CHG Soap to your body ONLY FROM THE NECK DOWN.  Do not use on open wounds or open sores.  Avoid contact with your eyes ears, mouth and genitals (private parts).  Wash genitals (private parts)       with your normal soap.  6.  Wash thoroughly, paying special attention to the area where your surgery will be performed.  7.  Thoroughly rinse your body with warm water from the neck down.  8.  DO NOT shower/wash with your normal soap after using and rinsing off the CHG Soap.  9.  Pat yourself dry with a clean towel.            10.  Wear clean pajamas.            11.  Place clean sheets on your bed the night of your first shower and do not sleep with pets.  Day of Surgery  Do not apply any lotions/deodorants the morning of surgery.  Please wear clean clothes to the hospital/surgery center.   Please read over the following fact sheets that you were given: pain ,coughing and deep breathing, surgical site infection, blood transfusion, mssa

## 2013-12-01 ENCOUNTER — Encounter (HOSPITAL_COMMUNITY): Payer: Medicare Other

## 2013-12-01 MED ORDER — DEXTROSE 5 % IV SOLN
1.5000 g | INTRAVENOUS | Status: AC
  Administered 2013-12-02: 1.5 g via INTRAVENOUS
  Filled 2013-12-01: qty 1.5

## 2013-12-01 NOTE — Progress Notes (Signed)
Anesthesia chart review: Patient is 58 year old female scheduled for right VATS, wedge resection, possible lower lobectomy or segmentectomy for right lower lobe nodule on 12/02/13 by Dr. Roxan Hockey.  History includes CKD stage III, nephrolithiasis, smoking, COPD, CAD (chronically occluded RCA with left to right collaterals) with angina history, LV dysfunction, CHF, carotid artery disease (no significant stenosis in 2003), non-Hodgkin's lymphoma with recurrence, vulvar cancer s/p vulvectomy '13, hyperlipidemia, chronic back pain, hypertension, arthritis, GERD, anemia, depression, anxiety, osteoporosis, asthma, CVA 2004, PAD s/p bilateral CIA stents. Nephrologist is Dr. Ulice Bold with [facility]. Cardiologist is Dr. Burt Knack who cleared her following a low risk stress test. PAT RN notes indicate that Plavix and ASA are on hold. PCP is listed as Dr. Sinda Du. Oncologist is Dr. Farrel Gobble.  EKG on 11/06/13 showed NSR.  Nuclear stress test on 11/18/13 showed: Overall Impression: Low risk stress nuclear study with an inferior wall scar. It is difficult to completely exclude reversible ischemia in the inferior wall due to splanchnic uptake interference, but the majority of the defect appears fixed.  LV Ejection Fraction: 47%. LV Wall Motion: inferior wall hypokinesis and mildly reduced overall systolic function. (Results were reviewed by Dr. Burt Knack who felt she was OK to proceed with surgery as planned without further cardiac evaluation.)  Echo on 05/07/12 showed: - Left ventricle: The cavity size was normal. Wall thickness was normal. Systolic function was normal. The estimated ejection fraction was in the range of 55% to 65%. Wall motion was normal; there were no regional wall motion abnormalities. Left ventricular diastolic function parameters were normal. - Mitral valve: Calcified subcortal apparatus and papillary muscles Calcified annulus. Mildly thickened leaflets . Mild  regurgitation. - Atrial septum: No defect or patent foramen ovale was identified. - Pulmonary arteries: PA peak pressure: 81mm Hg (S).  Cardiac Cath 01/05/2009:  FINDINGS: Coronary angiography: Left main coronary artery is widely patent. It bifurcates into the LAD and left circumflex. LAD: The LAD is a large-caliber vessel that courses down and reaches the LV apex. There was no significant stenosis present. There are two small diagonal branches present. The septal perforators of the LAD supply collaterals to the distal right coronary artery. Left circumflex: The left circumflex is widely patent. It supplies two OM branches. There was no significant stenosis throughout. Right coronary artery: The right coronary artery is subtotaled. There was a long diffuse severe stenosis throughout the midportion of the vessel. Distally, the posterolateral branch and PDA fill from antegrade flow as well as left-to-right collaterals.   CXR on 11/30/13 showed: 1. The medial right lower lobe ground-glass nodule is not clearly visible on this study.  2. Previous left lower lobe scarring.  3. Left subclavian Port-A-Cath.  4. No acute cardiopulmonary disease.   Pulmonary function testing 10/21/13: FVC 2.75 (79%), 2.57 post bronchodilator  FEV1 1.80 (66%), 1.78 post bronchodilator  TLC 4.98  DLCO 58%  Consistent with COPD with no significant response to bronchodilators  Preoperative labs noted and are marked as reviewed by Dr. Skipper Cliche.  Cr appears stable at 1.59.  She has been cleared by cardiology and her renal function appear stable. If no acute changes that I would anticipate she can proceed as planned.  George Hugh [facility] Short Stay Center/Anesthesiology Phone [phone] 12/01/2013 11:05 AM

## 2013-12-02 ENCOUNTER — Encounter (HOSPITAL_COMMUNITY)
Admission: RE | Disposition: A | Discharge status: Home / Self Care | Payer: Self-pay | Source: Ambulatory Visit | Attending: Thoracic Surgery (Cardiothoracic Vascular Surgery)

## 2013-12-02 ENCOUNTER — Inpatient Hospital Stay (HOSPITAL_COMMUNITY): Payer: Medicare Other | Admitting: Vascular Surgery

## 2013-12-02 ENCOUNTER — Encounter (HOSPITAL_COMMUNITY): Payer: Self-pay

## 2013-12-02 ENCOUNTER — Inpatient Hospital Stay (HOSPITAL_COMMUNITY)
Admission: RE | Admit: 2013-12-02 | Discharge: 2013-12-06 | DRG: 821 | Disposition: A | Discharge status: Home / Self Care | Payer: Medicare Other | Source: Ambulatory Visit | Attending: Thoracic Surgery (Cardiothoracic Vascular Surgery) | Admitting: Thoracic Surgery (Cardiothoracic Vascular Surgery)

## 2013-12-02 ENCOUNTER — Encounter (HOSPITAL_COMMUNITY): Payer: Medicare Other | Admitting: Vascular Surgery

## 2013-12-02 ENCOUNTER — Inpatient Hospital Stay (HOSPITAL_COMMUNITY): Payer: Medicare Other

## 2013-12-02 DIAGNOSIS — G8929 Other chronic pain: Secondary | ICD-10-CM | POA: Diagnosis present

## 2013-12-02 DIAGNOSIS — I209 Angina pectoris, unspecified: Secondary | ICD-10-CM | POA: Diagnosis present

## 2013-12-02 DIAGNOSIS — F329 Major depressive disorder, single episode, unspecified: Secondary | ICD-10-CM | POA: Diagnosis present

## 2013-12-02 DIAGNOSIS — Z833 Family history of diabetes mellitus: Secondary | ICD-10-CM

## 2013-12-02 DIAGNOSIS — N183 Chronic kidney disease, stage 3 unspecified: Secondary | ICD-10-CM | POA: Diagnosis present

## 2013-12-02 DIAGNOSIS — I2582 Chronic total occlusion of coronary artery: Secondary | ICD-10-CM | POA: Diagnosis present

## 2013-12-02 DIAGNOSIS — R222 Localized swelling, mass and lump, trunk: Secondary | ICD-10-CM

## 2013-12-02 DIAGNOSIS — J449 Chronic obstructive pulmonary disease, unspecified: Secondary | ICD-10-CM | POA: Diagnosis present

## 2013-12-02 DIAGNOSIS — I739 Peripheral vascular disease, unspecified: Secondary | ICD-10-CM | POA: Diagnosis present

## 2013-12-02 DIAGNOSIS — J9819 Other pulmonary collapse: Secondary | ICD-10-CM | POA: Diagnosis not present

## 2013-12-02 DIAGNOSIS — Z8249 Family history of ischemic heart disease and other diseases of the circulatory system: Secondary | ICD-10-CM

## 2013-12-02 DIAGNOSIS — Z7902 Long term (current) use of antithrombotics/antiplatelets: Secondary | ICD-10-CM

## 2013-12-02 DIAGNOSIS — Z8051 Family history of malignant neoplasm of kidney: Secondary | ICD-10-CM

## 2013-12-02 DIAGNOSIS — J4489 Other specified chronic obstructive pulmonary disease: Secondary | ICD-10-CM | POA: Diagnosis present

## 2013-12-02 DIAGNOSIS — I129 Hypertensive chronic kidney disease with stage 1 through stage 4 chronic kidney disease, or unspecified chronic kidney disease: Secondary | ICD-10-CM | POA: Diagnosis present

## 2013-12-02 DIAGNOSIS — F17200 Nicotine dependence, unspecified, uncomplicated: Secondary | ICD-10-CM | POA: Diagnosis present

## 2013-12-02 DIAGNOSIS — C8584 Other specified types of non-Hodgkin lymphoma, lymph nodes of axilla and upper limb: Secondary | ICD-10-CM | POA: Diagnosis present

## 2013-12-02 DIAGNOSIS — J9610 Chronic respiratory failure, unspecified whether with hypoxia or hypercapnia: Secondary | ICD-10-CM | POA: Diagnosis present

## 2013-12-02 DIAGNOSIS — I252 Old myocardial infarction: Secondary | ICD-10-CM

## 2013-12-02 DIAGNOSIS — Z9861 Coronary angioplasty status: Secondary | ICD-10-CM

## 2013-12-02 DIAGNOSIS — Z8673 Personal history of transient ischemic attack (TIA), and cerebral infarction without residual deficits: Secondary | ICD-10-CM

## 2013-12-02 DIAGNOSIS — Z8544 Personal history of malignant neoplasm of other female genital organs: Secondary | ICD-10-CM

## 2013-12-02 DIAGNOSIS — Z79899 Other long term (current) drug therapy: Secondary | ICD-10-CM

## 2013-12-02 DIAGNOSIS — E785 Hyperlipidemia, unspecified: Secondary | ICD-10-CM | POA: Diagnosis present

## 2013-12-02 DIAGNOSIS — R911 Solitary pulmonary nodule: Secondary | ICD-10-CM

## 2013-12-02 DIAGNOSIS — Z825 Family history of asthma and other chronic lower respiratory diseases: Secondary | ICD-10-CM

## 2013-12-02 DIAGNOSIS — F411 Generalized anxiety disorder: Secondary | ICD-10-CM | POA: Diagnosis present

## 2013-12-02 DIAGNOSIS — Z7982 Long term (current) use of aspirin: Secondary | ICD-10-CM

## 2013-12-02 DIAGNOSIS — Z801 Family history of malignant neoplasm of trachea, bronchus and lung: Secondary | ICD-10-CM

## 2013-12-02 DIAGNOSIS — I2510 Atherosclerotic heart disease of native coronary artery without angina pectoris: Secondary | ICD-10-CM | POA: Diagnosis present

## 2013-12-02 DIAGNOSIS — C8582 Other specified types of non-Hodgkin lymphoma, intrathoracic lymph nodes: Principal | ICD-10-CM | POA: Diagnosis present

## 2013-12-02 DIAGNOSIS — D62 Acute posthemorrhagic anemia: Secondary | ICD-10-CM | POA: Diagnosis not present

## 2013-12-02 DIAGNOSIS — M810 Age-related osteoporosis without current pathological fracture: Secondary | ICD-10-CM | POA: Diagnosis present

## 2013-12-02 DIAGNOSIS — M549 Dorsalgia, unspecified: Secondary | ICD-10-CM | POA: Diagnosis present

## 2013-12-02 DIAGNOSIS — F3289 Other specified depressive episodes: Secondary | ICD-10-CM | POA: Diagnosis present

## 2013-12-02 DIAGNOSIS — K219 Gastro-esophageal reflux disease without esophagitis: Secondary | ICD-10-CM | POA: Diagnosis present

## 2013-12-02 HISTORY — PX: VIDEO ASSISTED THORACOSCOPY (VATS)/WEDGE RESECTION: SHX6174

## 2013-12-02 SURGERY — VIDEO ASSISTED THORACOSCOPY (VATS)/WEDGE RESECTION
Anesthesia: General | Site: Chest | Laterality: Right

## 2013-12-02 MED ORDER — OXYCODONE HCL 5 MG PO TABS: 5.0000 mg | ORAL_TABLET | Freq: Once | ORAL | Status: DC | PRN

## 2013-12-02 MED ORDER — FENTANYL CITRATE 0.05 MG/ML IJ SOLN
INTRAMUSCULAR | Status: AC
  Filled 2013-12-02: qty 5

## 2013-12-02 MED ORDER — ACETAMINOPHEN 500 MG PO TABS
1000.0000 mg | ORAL_TABLET | Freq: Four times a day (QID) | ORAL | Status: DC
  Administered 2013-12-02: 650 mg via ORAL
  Administered 2013-12-02 – 2013-12-06 (×12): 1000 mg via ORAL
  Filled 2013-12-02 (×20): qty 2

## 2013-12-02 MED ORDER — DIAZEPAM 5 MG PO TABS
5.0000 mg | ORAL_TABLET | Freq: Four times a day (QID) | ORAL | Status: DC | PRN
  Administered 2013-12-02: 5 mg via ORAL

## 2013-12-02 MED ORDER — OXYCODONE HCL 5 MG PO TABS
ORAL_TABLET | ORAL | Status: AC
  Administered 2013-12-02: 30 mg via ORAL
  Filled 2013-12-02: qty 6

## 2013-12-02 MED ORDER — GLYCOPYRROLATE 0.2 MG/ML IJ SOLN
INTRAMUSCULAR | Status: DC | PRN
  Administered 2013-12-02: 0.6 mg via INTRAVENOUS

## 2013-12-02 MED ORDER — ALPRAZOLAM 0.5 MG PO TABS
1.0000 mg | ORAL_TABLET | Freq: Three times a day (TID) | ORAL | Status: DC
  Administered 2013-12-03 – 2013-12-06 (×10): 1 mg via ORAL
  Filled 2013-12-02 (×10): qty 2

## 2013-12-02 MED ORDER — OXYCODONE HCL ER 40 MG PO T12A
80.0000 mg | EXTENDED_RELEASE_TABLET | Freq: Four times a day (QID) | ORAL | Status: DC
  Administered 2013-12-03 – 2013-12-06 (×13): 80 mg via ORAL
  Filled 2013-12-02 (×7): qty 2
  Filled 2013-12-02: qty 8
  Filled 2013-12-02 (×10): qty 2

## 2013-12-02 MED ORDER — METOPROLOL TARTRATE 25 MG PO TABS
25.0000 mg | ORAL_TABLET | Freq: Two times a day (BID) | ORAL | Status: DC
  Administered 2013-12-03 – 2013-12-06 (×6): 25 mg via ORAL
  Filled 2013-12-02 (×9): qty 1

## 2013-12-02 MED ORDER — 0.9 % SODIUM CHLORIDE (POUR BTL) OPTIME
TOPICAL | Status: DC | PRN
  Administered 2013-12-02: 1000 mL

## 2013-12-02 MED ORDER — OXYCODONE HCL 5 MG/5ML PO SOLN: 5.0000 mg | Freq: Once | ORAL | Status: DC | PRN

## 2013-12-02 MED ORDER — OXYCODONE HCL 5 MG PO TABS
30.0000 mg | ORAL_TABLET | ORAL | Status: DC | PRN
  Administered 2013-12-02 – 2013-12-06 (×2): 30 mg via ORAL
  Filled 2013-12-02: qty 6

## 2013-12-02 MED ORDER — EPHEDRINE SULFATE 50 MG/ML IJ SOLN
INTRAMUSCULAR | Status: DC | PRN
  Administered 2013-12-02: 5 mg via INTRAVENOUS

## 2013-12-02 MED ORDER — BUPIVACAINE HCL (PF) 0.5 % IJ SOLN
INTRAMUSCULAR | Status: AC
  Filled 2013-12-02: qty 10

## 2013-12-02 MED ORDER — DIPHENHYDRAMINE HCL 50 MG/ML IJ SOLN: 12.5000 mg | Freq: Four times a day (QID) | INTRAMUSCULAR | Status: DC | PRN

## 2013-12-02 MED ORDER — ONDANSETRON HCL 4 MG/2ML IJ SOLN: 4.0000 mg | Freq: Four times a day (QID) | INTRAMUSCULAR | Status: DC | PRN

## 2013-12-02 MED ORDER — MIDAZOLAM HCL 2 MG/2ML IJ SOLN
INTRAMUSCULAR | Status: AC
  Filled 2013-12-02: qty 2

## 2013-12-02 MED ORDER — SENNOSIDES-DOCUSATE SODIUM 8.6-50 MG PO TABS
1.0000 | ORAL_TABLET | Freq: Every day | ORAL | Status: DC
  Administered 2013-12-03 – 2013-12-04 (×2): 1 via ORAL
  Filled 2013-12-02 (×5): qty 1

## 2013-12-02 MED ORDER — POTASSIUM CHLORIDE 10 MEQ/50ML IV SOLN
10.0000 meq | Freq: Every day | INTRAVENOUS | Status: DC | PRN
Start: 2013-12-02 — End: 2013-12-06

## 2013-12-02 MED ORDER — DEXMEDETOMIDINE HCL IN NACL 400 MCG/100ML IV SOLN
INTRAVENOUS | Status: DC | PRN
  Administered 2013-12-02: 0.5 ug/kg/h via INTRAVENOUS

## 2013-12-02 MED ORDER — DIPHENHYDRAMINE HCL 12.5 MG/5ML PO ELIX
12.5000 mg | ORAL_SOLUTION | Freq: Four times a day (QID) | ORAL | Status: DC | PRN
  Filled 2013-12-02: qty 5

## 2013-12-02 MED ORDER — PROPOFOL 10 MG/ML IV BOLUS
INTRAVENOUS | Status: AC
  Filled 2013-12-02: qty 20

## 2013-12-02 MED ORDER — ALBUTEROL SULFATE HFA 108 (90 BASE) MCG/ACT IN AERS: 2.0000 | INHALATION_SPRAY | Freq: Four times a day (QID) | RESPIRATORY_TRACT | Status: DC | PRN

## 2013-12-02 MED ORDER — DEXMEDETOMIDINE HCL IN NACL 200 MCG/50ML IV SOLN: 0.3000 ug/kg/h | INTRAVENOUS | Status: DC

## 2013-12-02 MED ORDER — BUPIVACAINE HCL 0.5 % IJ SOLN
INTRAMUSCULAR | Status: DC | PRN
  Administered 2013-12-02: 5 mL

## 2013-12-02 MED ORDER — PROPOFOL 10 MG/ML IV BOLUS
INTRAVENOUS | Status: DC | PRN
  Administered 2013-12-02: 140 mg via INTRAVENOUS

## 2013-12-02 MED ORDER — LACTATED RINGERS IV SOLN
INTRAVENOUS | Status: DC | PRN
  Administered 2013-12-02 (×2): via INTRAVENOUS

## 2013-12-02 MED ORDER — ISOSORBIDE MONONITRATE 15 MG HALF TABLET
15.0000 mg | ORAL_TABLET | Freq: Every day | ORAL | Status: DC
  Administered 2013-12-03 – 2013-12-06 (×4): 15 mg via ORAL
  Filled 2013-12-02 (×4): qty 1

## 2013-12-02 MED ORDER — BISACODYL 5 MG PO TBEC
10.0000 mg | DELAYED_RELEASE_TABLET | Freq: Every day | ORAL | Status: DC
  Administered 2013-12-02 – 2013-12-04 (×3): 10 mg via ORAL
  Filled 2013-12-02 (×3): qty 2

## 2013-12-02 MED ORDER — DEXTROSE 5 % IV SOLN
1.5000 g | Freq: Two times a day (BID) | INTRAVENOUS | Status: AC
  Administered 2013-12-02 – 2013-12-03 (×2): 1.5 g via INTRAVENOUS
  Filled 2013-12-02 (×2): qty 1.5

## 2013-12-02 MED ORDER — MIDAZOLAM HCL 5 MG/5ML IJ SOLN
INTRAMUSCULAR | Status: DC | PRN
  Administered 2013-12-02: 2 mg via INTRAVENOUS

## 2013-12-02 MED ORDER — NALOXONE HCL 0.4 MG/ML IJ SOLN: 0.4000 mg | INTRAMUSCULAR | Status: DC | PRN

## 2013-12-02 MED ORDER — ALBUTEROL SULFATE (2.5 MG/3ML) 0.083% IN NEBU: 2.5000 mg | INHALATION_SOLUTION | Freq: Four times a day (QID) | RESPIRATORY_TRACT | Status: DC | PRN

## 2013-12-02 MED ORDER — ONDANSETRON HCL 4 MG/2ML IJ SOLN
INTRAMUSCULAR | Status: DC | PRN
  Administered 2013-12-02: 4 mg via INTRAVENOUS

## 2013-12-02 MED ORDER — AMITRIPTYLINE HCL 50 MG PO TABS
50.0000 mg | ORAL_TABLET | Freq: Every day | ORAL | Status: DC
  Administered 2013-12-03 – 2013-12-05 (×3): 50 mg via ORAL
  Filled 2013-12-02 (×4): qty 1

## 2013-12-02 MED ORDER — ROCURONIUM BROMIDE 100 MG/10ML IV SOLN
INTRAVENOUS | Status: DC | PRN
  Administered 2013-12-02: 50 mg via INTRAVENOUS

## 2013-12-02 MED ORDER — PANTOPRAZOLE SODIUM 40 MG PO TBEC
80.0000 mg | DELAYED_RELEASE_TABLET | Freq: Every day | ORAL | Status: DC
  Administered 2013-12-03 – 2013-12-06 (×4): 80 mg via ORAL
  Filled 2013-12-02 (×4): qty 2

## 2013-12-02 MED ORDER — FENTANYL CITRATE 0.05 MG/ML IJ SOLN
INTRAMUSCULAR | Status: DC | PRN
  Administered 2013-12-02: 100 ug via INTRAVENOUS
  Administered 2013-12-02: 50 ug via INTRAVENOUS
  Administered 2013-12-02 (×2): 100 ug via INTRAVENOUS
  Administered 2013-12-02: 50 ug via INTRAVENOUS
  Administered 2013-12-02: 100 ug via INTRAVENOUS

## 2013-12-02 MED ORDER — NEOSTIGMINE METHYLSULFATE 10 MG/10ML IV SOLN
INTRAVENOUS | Status: DC | PRN
  Administered 2013-12-02: 4 mg via INTRAVENOUS

## 2013-12-02 MED ORDER — CYCLOSPORINE 0.05 % OP EMUL
1.0000 [drp] | Freq: Two times a day (BID) | OPHTHALMIC | Status: DC
  Administered 2013-12-02 – 2013-12-06 (×8): 1 [drp] via OPHTHALMIC
  Filled 2013-12-02 (×9): qty 1

## 2013-12-02 MED ORDER — ACETAMINOPHEN 160 MG/5ML PO SOLN
1000.0000 mg | Freq: Four times a day (QID) | ORAL | Status: DC
  Filled 2013-12-02: qty 40

## 2013-12-02 MED ORDER — ASPIRIN EC 81 MG PO TBEC
81.0000 mg | DELAYED_RELEASE_TABLET | Freq: Every day | ORAL | Status: DC
  Administered 2013-12-02 – 2013-12-06 (×5): 81 mg via ORAL
  Filled 2013-12-02 (×5): qty 1

## 2013-12-02 MED ORDER — BUPIVACAINE 0.5 % ON-Q PUMP SINGLE CATH 400 ML
400.0000 mL | INJECTION | Status: DC
  Filled 2013-12-02: qty 400

## 2013-12-02 MED ORDER — ATORVASTATIN CALCIUM 80 MG PO TABS
80.0000 mg | ORAL_TABLET | Freq: Every day | ORAL | Status: DC
  Administered 2013-12-02 – 2013-12-05 (×4): 80 mg via ORAL
  Filled 2013-12-02 (×5): qty 1

## 2013-12-02 MED ORDER — DIAZEPAM 5 MG PO TABS
ORAL_TABLET | ORAL | Status: AC
  Administered 2013-12-02: 5 mg via ORAL
  Filled 2013-12-02: qty 1

## 2013-12-02 MED ORDER — BUPROPION HCL ER (XL) 150 MG PO TB24
150.0000 mg | ORAL_TABLET | Freq: Every day | ORAL | Status: DC
  Administered 2013-12-03 – 2013-12-06 (×4): 150 mg via ORAL
  Filled 2013-12-02 (×4): qty 1

## 2013-12-02 MED ORDER — ACETAMINOPHEN 325 MG PO TABS
ORAL_TABLET | ORAL | Status: AC
  Administered 2013-12-02: 650 mg via ORAL
  Filled 2013-12-02: qty 2

## 2013-12-02 MED ORDER — HYDROMORPHONE HCL PF 1 MG/ML IJ SOLN
0.2500 mg | INTRAMUSCULAR | Status: DC | PRN
Start: 2013-12-02 — End: 2013-12-02

## 2013-12-02 MED ORDER — KCL IN DEXTROSE-NACL 20-5-0.45 MEQ/L-%-% IV SOLN
INTRAVENOUS | Status: DC
  Administered 2013-12-02 – 2013-12-03 (×2): via INTRAVENOUS
  Filled 2013-12-02 (×4): qty 1000

## 2013-12-02 MED ORDER — BUPIVACAINE HCL (PF) 0.25 % IJ SOLN
INTRAMUSCULAR | Status: AC
  Filled 2013-12-02: qty 10

## 2013-12-02 MED ORDER — SODIUM CHLORIDE 0.9 % IJ SOLN
9.0000 mL | INTRAMUSCULAR | Status: DC | PRN
Start: 2013-12-02 — End: 2013-12-06

## 2013-12-02 MED ORDER — FENTANYL 10 MCG/ML IV SOLN
INTRAVENOUS | Status: DC
  Administered 2013-12-02: 225 ug via INTRAVENOUS
  Administered 2013-12-02: 11:00:00 via INTRAVENOUS
  Administered 2013-12-02: 30 ug via INTRAVENOUS
  Administered 2013-12-03: 274 ug via INTRAVENOUS
  Administered 2013-12-03: 05:00:00 via INTRAVENOUS
  Administered 2013-12-03: 60 ug via INTRAVENOUS
  Administered 2013-12-03: 13:00:00 via INTRAVENOUS
  Administered 2013-12-03: 433 ug via INTRAVENOUS
  Administered 2013-12-03: 105 ug via INTRAVENOUS
  Administered 2013-12-03: 195 ug via INTRAVENOUS
  Administered 2013-12-04: 150 ug via INTRAVENOUS
  Administered 2013-12-04: 237.4 ug via INTRAVENOUS
  Administered 2013-12-04: 09:00:00 via INTRAVENOUS
  Administered 2013-12-04: 165 ug via INTRAVENOUS
  Administered 2013-12-04: 45 ug via INTRAVENOUS
  Administered 2013-12-04: 270 ug via INTRAVENOUS
  Administered 2013-12-04: 162.6 ug via INTRAVENOUS
  Administered 2013-12-05: 75 ug via INTRAVENOUS
  Administered 2013-12-05: 238.7 ug via INTRAVENOUS
  Administered 2013-12-05: 105 ug via INTRAVENOUS
  Administered 2013-12-05: 90 ug via INTRAVENOUS
  Filled 2013-12-02 (×6): qty 50

## 2013-12-02 MED ORDER — PHENYLEPHRINE HCL 10 MG/ML IJ SOLN
INTRAMUSCULAR | Status: DC | PRN
  Administered 2013-12-02: 40 ug via INTRAVENOUS
  Administered 2013-12-02 (×2): 80 ug via INTRAVENOUS
  Administered 2013-12-02: 40 ug via INTRAVENOUS
  Administered 2013-12-02: 80 ug via INTRAVENOUS

## 2013-12-02 SURGICAL SUPPLY — 72 items
BLADE SURG 11 STRL SS (BLADE) ×4 IMPLANT
CANISTER SUCTION 2500CC (MISCELLANEOUS) ×8 IMPLANT
CATH KIT ON Q 5IN SLV (PAIN MANAGEMENT) IMPLANT
CATH THORACIC 28FR (CATHETERS) ×4 IMPLANT
CATH THORACIC 36FR (CATHETERS) IMPLANT
CATH THORACIC 36FR RT ANG (CATHETERS) IMPLANT
CLIP TI MEDIUM 6 (CLIP) ×4 IMPLANT
CONT SPEC 4OZ CLIKSEAL STRL BL (MISCELLANEOUS) ×12 IMPLANT
COVER SURGICAL LIGHT HANDLE (MISCELLANEOUS) ×4 IMPLANT
DERMABOND ADVANCED (GAUZE/BANDAGES/DRESSINGS) ×2
DERMABOND ADVANCED .7 DNX12 (GAUZE/BANDAGES/DRESSINGS) ×2 IMPLANT
DRAIN CHANNEL 28F RND 3/8 FF (WOUND CARE) IMPLANT
DRAIN CHANNEL 32F RND 10.7 FF (WOUND CARE) IMPLANT
DRAPE LAPAROSCOPIC ABDOMINAL (DRAPES) ×4 IMPLANT
DRAPE SLUSH/WARMER DISC (DRAPES) ×4 IMPLANT
ELECT REM PT RETURN 9FT ADLT (ELECTROSURGICAL) ×4
ELECTRODE REM PT RTRN 9FT ADLT (ELECTROSURGICAL) ×2 IMPLANT
GLOVE SURG SIGNA 7.5 PF LTX (GLOVE) ×16 IMPLANT
GOWN STRL REUS W/ TWL LRG LVL3 (GOWN DISPOSABLE) ×4 IMPLANT
GOWN STRL REUS W/ TWL XL LVL3 (GOWN DISPOSABLE) ×4 IMPLANT
GOWN STRL REUS W/TWL LRG LVL3 (GOWN DISPOSABLE) ×4
GOWN STRL REUS W/TWL XL LVL3 (GOWN DISPOSABLE) ×4
HANDLE STAPLE ENDO GIA SHORT (STAPLE) ×2
HEMOSTAT SURGICEL 2X14 (HEMOSTASIS) IMPLANT
KIT BASIN OR (CUSTOM PROCEDURE TRAY) ×4 IMPLANT
KIT ROOM TURNOVER OR (KITS) ×4 IMPLANT
KIT SUCTION CATH 14FR (SUCTIONS) ×4 IMPLANT
NS IRRIG 1000ML POUR BTL (IV SOLUTION) ×8 IMPLANT
PACK CHEST (CUSTOM PROCEDURE TRAY) ×4 IMPLANT
PAD ARMBOARD 7.5X6 YLW CONV (MISCELLANEOUS) ×8 IMPLANT
PAIN PUMP ON-Q 400MLX5ML 5IN (MISCELLANEOUS) ×4 IMPLANT
POUCH ENDO CATCH II 15MM (MISCELLANEOUS) IMPLANT
POUCH SPECIMEN RETRIEVAL 10MM (ENDOMECHANICALS) ×4 IMPLANT
RELOAD EGIA 45 MED/THCK PURPLE (STAPLE) ×4 IMPLANT
RELOAD EGIA 60 MED/THCK PURPLE (STAPLE) ×4 IMPLANT
SEALANT PROGEL (MISCELLANEOUS) IMPLANT
SEALANT SURG COSEAL 4ML (VASCULAR PRODUCTS) IMPLANT
SEALANT SURG COSEAL 8ML (VASCULAR PRODUCTS) IMPLANT
SOLUTION ANTI FOG 6CC (MISCELLANEOUS) ×4 IMPLANT
SPONGE GAUZE 4X4 12PLY (GAUZE/BANDAGES/DRESSINGS) ×4 IMPLANT
SPONGE INTESTINAL PEANUT (DISPOSABLE) ×4 IMPLANT
STAPLER ENDO GIA 12MM SHORT (STAPLE) ×2 IMPLANT
SUT PROLENE 4 0 RB 1 (SUTURE)
SUT PROLENE 4-0 RB1 .5 CRCL 36 (SUTURE) IMPLANT
SUT SILK  1 MH (SUTURE) ×2
SUT SILK 1 MH (SUTURE) ×2 IMPLANT
SUT SILK 2 0SH CR/8 30 (SUTURE) IMPLANT
SUT SILK 3 0SH CR/8 30 (SUTURE) ×4 IMPLANT
SUT VIC AB 0 CTX 27 (SUTURE) IMPLANT
SUT VIC AB 1 CTX 27 (SUTURE) ×4 IMPLANT
SUT VIC AB 2-0 CT1 27 (SUTURE) ×2
SUT VIC AB 2-0 CT1 TAPERPNT 27 (SUTURE) ×2 IMPLANT
SUT VIC AB 2-0 CTX 36 (SUTURE) IMPLANT
SUT VIC AB 3-0 MH 27 (SUTURE) IMPLANT
SUT VIC AB 3-0 SH 27 (SUTURE)
SUT VIC AB 3-0 SH 27X BRD (SUTURE) IMPLANT
SUT VIC AB 3-0 X1 27 (SUTURE) ×8 IMPLANT
SUT VICRYL 0 UR6 27IN ABS (SUTURE) ×8 IMPLANT
SUT VICRYL 2 TP 1 (SUTURE) IMPLANT
SWAB COLLECTION DEVICE MRSA (MISCELLANEOUS) IMPLANT
SYSTEM SAHARA CHEST DRAIN ATS (WOUND CARE) ×4 IMPLANT
TAPE CLOTH SURG 4X10 WHT LF (GAUZE/BANDAGES/DRESSINGS) ×4 IMPLANT
TIP APPLICATOR SPRAY EXTEND 16 (VASCULAR PRODUCTS) IMPLANT
TOWEL OR 17X24 6PK STRL BLUE (TOWEL DISPOSABLE) ×4 IMPLANT
TOWEL OR 17X26 10 PK STRL BLUE (TOWEL DISPOSABLE) ×8 IMPLANT
TRAP SPECIMEN MUCOUS 40CC (MISCELLANEOUS) IMPLANT
TRAY FOLEY CATH 16FRSI W/METER (SET/KITS/TRAYS/PACK) ×4 IMPLANT
TROCAR XCEL BLADELESS 5X75MML (TROCAR) ×4 IMPLANT
TROCAR XCEL NON-BLD 5MMX100MML (ENDOMECHANICALS) IMPLANT
TUBE ANAEROBIC SPECIMEN COL (MISCELLANEOUS) IMPLANT
TUNNELER SHEATH ON-Q 11GX8 DSP (PAIN MANAGEMENT) ×4 IMPLANT
WATER STERILE IRR 1000ML POUR (IV SOLUTION) ×8 IMPLANT

## 2013-12-02 NOTE — Progress Notes (Signed)
Resting quietly. Sleeping most of the time. States pain is  "so much better"

## 2013-12-02 NOTE — Addendum Note (Signed)
Addendum created 12/02/13 1425 by Josephine Igo, CRNA   Modules edited: PRL Based Order Sets

## 2013-12-02 NOTE — Progress Notes (Signed)
Pt says fentanyl is causing her to have strange dreams and she is unable to rest. PCA button removed from pt at this time.  P.o. Valium and oxy IR given.   Will continue to monitor closely and reassess pain PRN

## 2013-12-02 NOTE — Progress Notes (Signed)
On adm to PACU cuff bp 20 points higher than art line . Cuff pressure reading 10 points higher in rt arm vs left arm (where Art line located). Dr. Marcie Bal  Made bedside postop check . Aware of difference. Dr. Lolita Lenz wishes to proceed with using bp results off of rt arm cuff.

## 2013-12-02 NOTE — H&P (View-Only) (Signed)
PCP is [name], MD Referring Provider is [name], MD  Chief Complaint  Patient presents with  . Lung Lesion    eval for resection....CT, PET, PFT'S    HPI: 58 year old woman with a complex medical history including heavy tobacco abuse, COPD, non-Hodgkin's lymphoma, coronary artery disease, and peripheral arterial disease.  She has a history of heavy tobacco abuse, up to 3 packs a day starting at age 22. She says she currently is smoking about one half a pack per day. She was first diagnosed with non-Hodgkin's lymphoma back in 2004. She was treated with a good response. She relapsed and 2009 and was once again treated.  She recently had a PET CT as followup for her non-Hodgkin's lymphoma. It showed a hypermetabolic left axillary lymph node. There also was a hypermetabolic 1.5 cm right lower lobe some solid nodule with max SUV of 3.0. A core biopsy was done of the axillary lymph node and it showed recurrent low-grade follicular lymphoma.  She has a history of coronary disease as well as peripheral arterial disease. She says that she has chest pain on a fairly regular basis. There is not been any acceleration recently. She says she gets short of breath and has chest pain with walking up one flight of stairs or up any significant incline. She's had a cough which is nonproductive for the past 2 months, but is improving. She denies fevers or chills. She has gained weight. She says she's tried to stop smoking and in fact has quit at times in the past but her anxiety causes her to start smoking again.  She saw Dr. Barnet Glasgow. He advised dealing with the lung nodule before beginning treatment for her recurrent lymphoma.  Past Medical History  Diagnosis Date  . Coronary artery disease     status post stenting of the aortic and iliac vessels by Dr. Gwenlyn Found. Repeat aortic stenting 2010  . Left ventricular dysfunction     hx of with ejection fraction of 35% range.  . Carotid artery disease      hx of carotid cerebrovascular disease with 0-39% bilateral internal carotid artery setenosis   . Non Hodgkin's lymphoma   . Chronic back pain     status post lumbar surgery  . Hyperlipidemia   . Hypertension   . COPD (chronic obstructive pulmonary disease)   . Shortness of breath   . Cancer     hx of non hogdin lymphoma  . Stroke   . CHF (congestive heart failure)   . Blood transfusion   . Arthritis   . Myocardial infarction   . Peripheral vascular disease   . GERD (gastroesophageal reflux disease)   . Anemia   . Osteoporosis   . Depression   . Anxiety   . Other malignant lymphomas, unspecified site, extranodal and solid organ sites 2004    Back  . Asthma   . Vulvar cancer     Past Surgical History  Procedure Laterality Date  . [facility] surg    . Back surgery      x 2  . Lung biopsy    . Cardiac catheterization    . Abdominal aortogram  08/08/2011  . Abdominal hysterectomy      partial  . Portacath placement      pt says its for chemo only and its an old port   . Dilation and curettage of uterus    . Vulva /perineum biopsy  04/09/2012    Procedure: VULVAR BIOPSY;  Surgeon:  [name], MD;  Location: [facility];  Service: Gynecology;  Laterality: N/A;  . Vulvectomy  05/20/2012    Procedure: VULVECTOMY;  Surgeon: Jeannette Corpus, MD;  Location: [facility];  Service: Gynecology;  Laterality: Bilateral;  BILATERAL SIMPLE VULVECTOMIES    Family History  Problem Relation Age of Onset  . Coronary artery disease      positive for vascular and cardiac disease  . Heart disease Mother   . Lung cancer Mother   . Heart attack Mother   . Heart failure Mother   . Heart disease Father   . Kidney cancer Father   . Heart attack Father   . Heart failure Father   . Diabetes Sister   . Aortic aneurysm Sister   . Heart attack Brother   . Asthma Son   . Sleep apnea Son   . Endometriosis Daughter     Social History History  Substance Use Topics  . Smoking status:  Current Every Day Smoker -- 2.50 packs/day for 40 years    Types: Cigarettes  . Smokeless tobacco: Never Used  . Alcohol Use: No    Current Outpatient Prescriptions  Medication Sig Dispense Refill  . albuterol (VENTOLIN HFA) 108 (90 BASE) MCG/ACT inhaler Inhale 2 puffs into the lungs every 6 (six) hours as needed. For asthma/wheezing      . ALPRAZolam (XANAX) 1 MG tablet Take 1 mg by mouth 3 (three) times daily.       [name] amitriptyline (ELAVIL) 50 MG tablet Take 50 mg by mouth at bedtime.      [name] aspirin 81 MG EC tablet Take 81 mg by mouth daily.        [name] buPROPion (WELLBUTRIN XL) 150 MG 24 hr tablet Take 150 mg by mouth daily.      . clopidogrel (PLAVIX) 75 MG tablet Take 75 mg by mouth daily with breakfast.      . cycloSPORINE (RESTASIS) 0.05 % ophthalmic emulsion Place 1 drop into both eyes 2 (two) times daily.      . diazepam (VALIUM) 5 MG tablet Take 5 mg by mouth every 6 (six) hours as needed. For muscle spasms      . furosemide (LASIX) 40 MG tablet Take 40 mg by mouth daily as needed for fluid.       . metoprolol (LOPRESSOR) 50 MG tablet Take 25 mg by mouth 2 (two) times daily.      . mupirocin ointment (BACTROBAN) 2 % Apply 1 application topically daily. As needed for leg infection.      [name] NEXIUM 40 MG capsule Take 40 mg by mouth Twice daily.      . nitroGLYCERIN (NITROSTAT) 0.4 MG SL tablet Place 0.4 mg under the tongue every 5 (five) minutes as needed for chest pain.      [name] oxyCODONE (OXYCONTIN) 80 MG 12 hr tablet Take 80 mg by mouth 4 (four) times daily.      [name] oxycodone (ROXICODONE) 30 MG immediate release tablet Take 30 mg by mouth every 4 (four) hours as needed. For breakthrough pain      . rosuvastatin (CRESTOR) 40 MG tablet Take 40 mg by mouth daily.        [name] sulfamethoxazole-trimethoprim (BACTRIM DS) 800-160 MG per tablet Take 1 tablet by mouth See admin instructions. Takes 1 tablet twice a day on Saturday and Sunday only.      . vitamin B-12 (CYANOCOBALAMIN) 1000 MCG tablet  Take 1,000 mcg by mouth daily.  No current facility-administered medications for this visit.    No Known Allergies  Review of Systems  Constitutional: Positive for appetite change and fatigue. Negative for fever and unexpected weight change.  Respiratory: Positive for cough (Productive, no hemoptysis), chest tightness, shortness of breath and wheezing.   Cardiovascular: Positive for chest pain.    BP 117/66  Pulse 100  Resp 18  Ht 5' 4.5" (1.638 m)  Wt 162 lb (73.483 kg)  BMI 27.39 kg/m2  SpO2 94% Physical Exam  Vitals reviewed. Constitutional: [name] oriented to person, place, and time. No distress.  58 yo woman who appears much older than her stated age  HENT:  Head: Normocephalic and atraumatic.  Eyes: EOM are normal. Pupils are equal, round, and reactive to light.  Neck: Neck supple. No thyromegaly present.  Cardiovascular: Normal rate, regular rhythm and normal heart sounds.   No murmur heard. Pulmonary/Chest: She has wheezes (faint in right base).  Small palpable left axillary node Well healed left thoracotomy incision  Abdominal: Soft. There is no tenderness.  Musculoskeletal: She exhibits no edema.  Lymphadenopathy:    She has no cervical adenopathy.  Neurological: [name] alert and oriented to person, place, and time. No cranial nerve deficit.  Skin: Skin is warm and dry.     Diagnostic Tests: NUCLEAR MEDICINE PET SKULL BASE TO THIGH  TECHNIQUE:  9.1 mCi F-18 FDG was injected intravenously. Full-ring PET imaging  was performed from the skull base to thigh after the radiotracer. CT  data was obtained and used for attenuation correction and anatomic  localization.  FASTING BLOOD GLUCOSE: Value: 109 mg/dl  COMPARISON: NM BONE WHOLE BODY dated 07/22/2012  FINDINGS:  NECK  No hypermetabolic lymph nodes in the neck.  CHEST  There is a single hypermetabolic mildly enlarged left axillary lymph  node measuring 15 mm short axis with SUV max 6.9. No additional   hypermetabolic axial lymph nodes are present on the left or right.  No hypermetabolic mediastinal lymph nodes.  Within the right lower lobe there is a 12 mm nodule with mild to  moderate metabolic activity (SUV max 3.0). This is new from more  remote PET CT scan.  ABDOMEN/PELVIS  No abnormal hypermetabolic activity within the liver, pancreas,  adrenal glands, or spleen. No hypermetabolic lymph nodes in the  abdomen or pelvis.  SKELETON  No focal hypermetabolic activity to suggest skeletal metastasis.  IMPRESSION:  1. Hypermetabolic left axillary lymph node is concerning for  lymphoma recurrence. Consider percutaneous biopsy.  2. Hypermetabolic right lower lobe pulmonary nodule is  indeterminate. Differential would include infection, primary lung  cancer, and less likely lymphoma. Recommend follow-up CT exam in  short interval (6 to 8 weeks) and lesions does not resolve consider  percutaneous biopsy.  3. No additional evidence of lymphoma recurrence.  Electronically Signed  By: Suzy Bouchard M.D.  On: 09/15/2013 17:28   Pulmonary function testing FVC 2.75 (79%), 2.57 post bronchodilator FEV1 1.80 (66%), 1.78 post bronchodilator TLC 4.98 DLCO 58% Consistent with COPD with no significant response to bronchodilators  Impression: 58 year old woman with a history of heavy tobacco abuse, COPD, recurrent non-Hodgkin's lymphoma, and severe atherosclerotic cardiovascular disease. She now is been found to have a 1.5 cm sub-solid nodule in the posterior aspect of the right lower lobe. This is hypermetabolic by PET with an SUV of 3. This has been considered a lung cancer was to be proven otherwise. The only definitively deal with this lesion is to do the excisional biopsy  for definitive diagnosis.  I recommended that she have a right video-assisted thoracoscopy, wedge resection, and then possible lower lobectomy or segmentectomy if the lesion is cancerous on frozen section. She previously  had a left video-assisted thoracoscopy and wedge resections by Dr. Arlyce Dice back in 2009 for bilateral cavitary nodules. These were not malignant.  [name] familiar with the procedure. However I still discussed with her the indications, risks, benefits, and alternatives. We discussed the general nature of the procedure including the incisions to be used, the need for general anesthesia, the use of the chest tube postoperatively, expected hospital stay, and overall recovery. She understands the risk include, but are not limited to death, MI, stroke, DVT, PE, infection, bleeding, possible need for transfusion, prolonged air leak, cardiac arrhythmias, as well as the possibility of unforeseeable complications.  She accepts the risk and wishes to proceed.  [name] at high risk for cardiovascular complications and does have a history of coronary artery disease. She does have angina. She saw Dr. Burt Knack in December. I do think we need formal cardiology clearance given her active anginal symptoms, even they are not progressive.  I strongly advised her to quit smoking before surgery. I have no hope that she will do so.  Plan: Obtain cardiology clearance  Right VATS, wedge resection, possible lobectomy or segmentectomy

## 2013-12-02 NOTE — Transfer of Care (Signed)
Immediate Anesthesia Transfer of Care Note  Patient: [name]  Procedure(s) Performed: Procedure(s): VIDEO ASSISTED THORACOSCOPY (VATS)/WEDGE RESECTION (Right)  Patient Location: PACU  Anesthesia Type:General  Level of Consciousness: awake, alert  and oriented  Airway & Oxygen Therapy: Patient Spontanous Breathing and Patient connected to face mask oxygen  Post-op Assessment: Report given to PACU RN and Post -op Vital signs reviewed and stable  Post vital signs: Reviewed and stable  Complications: No apparent anesthesia complications

## 2013-12-02 NOTE — Progress Notes (Signed)
Post wedge resection  BP 135/47  Pulse 62  Temp(Src) 97.7 F (36.5 C) (Oral)  Resp 17  Wt 168 lb (76.204 kg)  SpO2 99%   Intake/Output Summary (Last 24 hours) at 12/02/13 1829 Last data filed at 12/02/13 1700  Gross per 24 hour  Intake 1122.5 ml  Output    600 ml  Net  522.5 ml    Minimal CT output, no air leak  Pain control good currently

## 2013-12-02 NOTE — Interval H&P Note (Signed)
History and Physical Interval Note:  12/02/2013 8:14 AM  [name]  has presented today for surgery, with the diagnosis of RLL NODULE  The various methods of treatment have been discussed with the patient and family. After consideration of risks, benefits and other options for treatment, the patient has consented to  Procedure(s): VIDEO ASSISTED THORACOSCOPY (VATS)/WEDGE RESECTION (Right) POSSIBLE LOWER LOBECTOMY (Right) POSSIBLE SEGMENTECTOMY (Right) as a surgical intervention .  The patient's history has been reviewed, patient examined, no change in status, stable for surgery.  I have reviewed the patient's chart and labs.  Questions were answered to the patient's satisfaction.     Melrose Nakayama

## 2013-12-02 NOTE — Brief Op Note (Addendum)
12/02/2013  9:57 AM  PATIENT:  [name]  58 y.o. female  PRE-OPERATIVE DIAGNOSIS:  Right lower lobe nodule  POST-OPERATIVE DIAGNOSIS:  Right lower lobe nodule, possible lymphoma by frozen section  PROCEDURE:   RIGHT VIDEO ASSISTED THORACOSCOPY RIGHT LOWER LOBE WEDGE RESECTION ON-Q LOCAL ANESTHETIC CATHETER PLACEMENT  SURGEON:  Melrose Nakayama, MD  ASSISTANT: Suzzanne Cloud, PA-C  ANESTHESIA:   general  SPECIMEN:  Source of Specimen:  Right lower lobe  DISPOSITION OF SPECIMEN:  Pathology  DRAINS: 28 Fr CT x 1   PATIENT CONDITION:  PACU - hemodynamically stable.   FROZEN- mass not c/w primary lung cancer- most likely lymphoma

## 2013-12-02 NOTE — Anesthesia Postprocedure Evaluation (Signed)
Anesthesia Post Note  Patient: [name]  Procedure(s) Performed: Procedure(s) (LRB): VIDEO ASSISTED THORACOSCOPY (VATS)/WEDGE RESECTION (Right)  Anesthesia type: General  Patient location: PACU  Post pain: Pain level controlled and Adequate analgesia  Post assessment: Post-op Vital signs reviewed, Patient's Cardiovascular Status Stable, Respiratory Function Stable, Patent Airway and Pain level controlled  Last Vitals:  Filed Vitals:   12/02/13 1115  BP: 101/47  Pulse: 53  Temp:   Resp: 17    Post vital signs: Reviewed and stable  Level of consciousness: awake, alert  and oriented  Complications: No apparent anesthesia complications

## 2013-12-02 NOTE — Anesthesia Preprocedure Evaluation (Addendum)
Anesthesia Evaluation  Patient identified by MRN, date of birth, ID band Patient awake    Reviewed: Allergy & Precautions, H&P , NPO status , Patient's Chart, lab work & pertinent test results, reviewed documented beta blocker date and time   History of Anesthesia Complications Negative for: history of anesthetic complications  Airway Mallampati: II TM Distance: >3 FB Neck ROM: full    Dental  (+) Poor Dentition, Chipped, Missing, Loose, Dental Advisory Given   Pulmonary shortness of breath and at rest, asthma , COPDCurrent Smoker,          Cardiovascular hypertension, Pt. on medications and Pt. on home beta blockers + angina + CAD, + Past MI, + Peripheral Vascular Disease and +CHF + dysrhythmias     Neuro/Psych Anxiety Depression CVA    GI/Hepatic GERD-  Medicated,  Endo/Other    Renal/GU Renal InsufficiencyRenal disease     Musculoskeletal  (+) narcotic dependent  Abdominal   Peds  Hematology  (+) anemia ,   Anesthesia Other Findings   Reproductive/Obstetrics                          Anesthesia Physical Anesthesia Plan  ASA: III  Anesthesia Plan: General   Post-op Pain Management:    Induction: Intravenous  Airway Management Planned: Double Lumen EBT  Additional Equipment: Arterial line and CVP  Intra-op Plan:   Post-operative Plan: Extubation in OR  Informed Consent: I have reviewed the patients History and Physical, chart, labs and discussed the procedure including the risks, benefits and alternatives for the proposed anesthesia with the patient or authorized representative who has indicated his/her understanding and acceptance.     Plan Discussed with: CRNA, Anesthesiologist and Surgeon  Anesthesia Plan Comments:         Anesthesia Quick Evaluation

## 2013-12-02 NOTE — Anesthesia Procedure Notes (Signed)
Procedure Name: Intubation Date/Time: 12/02/2013 8:41 AM Performed by: Manuela Schwartz B Pre-anesthesia Checklist: Patient identified, Emergency Drugs available, Suction available, Patient being monitored and Timeout performed Patient Re-evaluated:Patient Re-evaluated prior to inductionOxygen Delivery Method: Circle system utilized Preoxygenation: Pre-oxygenation with 100% oxygen Intubation Type: IV induction Ventilation: Mask ventilation without difficulty Laryngoscope Size: Mac and 3 Endobronchial tube: Left, Double lumen EBT and EBT position confirmed by auscultation and 37 Fr Airway Equipment and Method: Stylet Placement Confirmation: ETT inserted through vocal cords under direct vision,  positive ETCO2 and breath sounds checked- equal and bilateral Tube secured with: Tape Dental Injury: Teeth and Oropharynx as per pre-operative assessment

## 2013-12-03 ENCOUNTER — Inpatient Hospital Stay (HOSPITAL_COMMUNITY): Payer: Medicare Other

## 2013-12-03 LAB — BASIC METABOLIC PANEL
BUN: 8 mg/dL (ref 6–23)
CHLORIDE: 103 meq/L (ref 96–112)
CO2: 29 meq/L (ref 19–32)
Calcium: 9 mg/dL (ref 8.4–10.5)
Creatinine, Ser: 1.19 mg/dL — ABNORMAL HIGH (ref 0.50–1.10)
GFR calc non Af Amer: 50 mL/min — ABNORMAL LOW (ref >90)
GFR, EST AFRICAN AMERICAN: 58 mL/min — AB (ref >90)
Glucose, Bld: 146 mg/dL — ABNORMAL HIGH (ref 70–99)
Potassium: 3.9 mEq/L (ref 3.7–5.3)
Sodium: 140 mEq/L (ref 137–147)

## 2013-12-03 LAB — BLOOD GAS, ARTERIAL
ACID-BASE EXCESS: 5.3 mmol/L — AB (ref 0.0–2.0)
BICARBONATE: 30.5 meq/L — AB (ref 20.0–24.0)
Drawn by: 40415
O2 Content: 2 L/min
O2 SAT: 91.3 %
PATIENT TEMPERATURE: 98.6
PO2 ART: 66.3 mmHg — AB (ref 80.0–100.0)
TCO2: 32.3 mmol/L (ref 0–100)
pCO2 arterial: 56 mmHg — ABNORMAL HIGH (ref 35.0–45.0)
pH, Arterial: 7.356 (ref 7.350–7.450)

## 2013-12-03 LAB — CBC
HCT: 34.5 % — ABNORMAL LOW (ref 36.0–46.0)
HEMOGLOBIN: 11.3 g/dL — AB (ref 12.0–15.0)
MCH: 31.1 pg (ref 26.0–34.0)
MCHC: 32.8 g/dL (ref 30.0–36.0)
MCV: 95 fL (ref 78.0–100.0)
Platelets: 114 10*3/uL — ABNORMAL LOW (ref 150–400)
RBC: 3.63 MIL/uL — ABNORMAL LOW (ref 3.87–5.11)
RDW: 15.3 % (ref 11.5–15.5)
WBC: 4.8 10*3/uL (ref 4.0–10.5)

## 2013-12-03 MED ORDER — CLOPIDOGREL BISULFATE 75 MG PO TABS
75.0000 mg | ORAL_TABLET | Freq: Every day | ORAL | Status: DC
  Administered 2013-12-04 – 2013-12-06 (×3): 75 mg via ORAL
  Filled 2013-12-03 (×4): qty 1

## 2013-12-03 MED ORDER — ENOXAPARIN SODIUM 40 MG/0.4ML ~~LOC~~ SOLN
40.0000 mg | SUBCUTANEOUS | Status: DC
  Administered 2013-12-03 – 2013-12-06 (×4): 40 mg via SUBCUTANEOUS
  Filled 2013-12-03 (×4): qty 0.4

## 2013-12-03 NOTE — Op Note (Signed)
NAMEANTONIETTA, Yu              ACCOUNT NO.:  [id number]  MEDICAL RECORD NO.:  [id number]  LOCATION:  2S16C                        FACILITY:  [facility]  PHYSICIAN:  Revonda Standard. Roxan Hockey, M.D.DATE OF BIRTH:  June 09, 1956  DATE OF PROCEDURE:  12/02/2013 DATE OF DISCHARGE:                              OPERATIVE REPORT   PREOPERATIVE DIAGNOSIS:  Right lower lobe nodule.  POSTOPERATIVE DIAGNOSES:  Right lower lobe nodule, probable lymphoma.  PROCEDURE:  Right video-assisted thoracoscopy, wedge resection of right lower lobe nodule, On-Q local anesthetic catheter placement.  SURGEON:  Revonda Standard. Roxan Hockey, M.D.  ASSISTANT:  Suzzanne Cloud, P.A.  ANESTHESIA:  General.  FINDINGS:  A 1.5-cm mass in the posterolateral aspect of the right lower lobe.  Frozen section revealed probable lymphoma, no evidence of primary lung cancer.  CLINICAL NOTE:  [name] is a 58 year old woman with a history of tobacco abuse, COPD and non-Hodgkin's lymphoma.  She recently was found to have recurrent lymphoma in an axillary lymph node, but on workup also was noted to have a sub-solid nodule in the right lower lobe, which was hypermetabolic on PET-CT.  She was advised to undergo right video- assisted thoracoscopy for wedge resection and possible segmentectomy or lobectomy depending on intraoperative findings.  The indications, risks, benefits, and alternatives were discussed in detail with the patient. She understood and accepted the risks and agreed to proceed.  OPERATIVE NOTE:  [name] was brought to the preoperative holding area on December 02, 2013. There, Anesthesia established intravenous access and placed an arterial blood pressure monitoring line.  Intravenous antibiotics were administered.  She was taken to the operating room, anesthetized, and intubated with a double-lumen endotracheal tube.  SCD  were placed for DVT prophylaxis.  A Foley catheter was placed.  She was placed in a left lateral  decubitus position and the right chest was prepped and draped in usual sterile fashion.  Single lung ventilation of the left lung was initiated and was tolerated well throughout the procedure.  An incision was made in approximately the seventh intercostal space in the midaxillary line and was carried through the skin and subcutaneous tissue.  The chest was entered bluntly using a 5-mm port.  The 5-mm scope was placed into the chest.  There was good isolation of the right lung.  A small working incision was made 4 cm in length in the fifth interspace anterolaterally.  No rib spreading was performed during the procedure.  The inferior ligament was divided, as was the pleural reflection along the posterior aspect of the hilum. This allowed the lung to be retracted anteriorly. Palpation of the lung along its posterolateral aspect revealed a 1.5-cm nodule.  This was grasped and a wedge resection was performed with sequential firings of endoscopic GIA stapler.  The specimen was placed into an endoscopic retrieval bag and removed through the incision and was sent for frozen section.  While awaiting the frozen section, the dissection of the fissures was initiated, the fissures were relatively incomplete and rather than do an extensive dissection, we waited for the results of the frozen section, which subsequently returned showing no evidence of a primary lung cancer, but was highly suspicious for lymphoma.  No further resection was indicated.  An On-Q local anesthetic catheter was advanced through a separate stab wound posteriorly into a subpleural location and secured with a 3-0 silk suture.  A 28-French chest tube was placed through the original port incision and secured with a #1 silk suture.  The right lung was reinflated, there was good expansion of all three lobes.  The remaining incision was closed with a #1 Vicryl fascial suture followed by 2-0 Vicryl subcutaneous suture and a 3-0  Vicryl subcuticular suture.  All sponge, needle, and instrument counts were correct at the end of the procedure.  The patient was taken from the operating room to the postanesthetic care unit in good condition.     Revonda Standard Roxan Hockey, M.D.     SCH/MEDQ  D:  12/02/2013  T:  12/03/2013  Job:  [id number]

## 2013-12-03 NOTE — Progress Notes (Signed)
1 Day Post-Op Procedure(s) (LRB): VIDEO ASSISTED THORACOSCOPY (VATS)/WEDGE RESECTION (Right) Subjective: C/o incisional pain Denies nausea  Objective: Vital signs in last 24 hours: Temp:  [97.5 F (36.4 C)-98.4 F (36.9 C)] 98 F (36.7 C) (06/04 0718) Pulse Rate:  [53-74] 74 (06/04 0700) Cardiac Rhythm:  [-] Normal sinus rhythm (06/04 0400) Resp:  [10-27] 17 (06/04 0747) BP: (84-143)/(43-97) 110/97 mmHg (06/04 0700) SpO2:  [81 %-100 %] 93 % (06/04 0747) Arterial Line BP: (63-109)/(37-66) 80/51 mmHg (06/04 0700) FiO2 (%):  [0 %] 0 % (06/04 0747)  Hemodynamic parameters for last 24 hours:    Intake/Output from previous day: 06/03 0701 - 06/04 0700 In: 2392 [I.V.:2342; IV Piggyback:50] Out: 2800 [Urine:2500; Chest Tube:300] Intake/Output this shift:    General appearance: alert and no distress Neurologic: intact Heart: regular rate and rhythm Lungs: diminished breath sounds bibasilar Abdomen: normal findings: soft, non-tender no air leak  Lab Results:  Recent Labs  11/30/13 1326 12/03/13 0415  WBC 4.7 4.8  HGB 12.3 11.3*  HCT 36.9 34.5*  PLT 159 114*   BMET:  Recent Labs  11/30/13 1326 12/03/13 0415  NA 139 140  K 4.4 3.9  CL 101 103  CO2 25 29  GLUCOSE 107* 146*  BUN 13 8  CREATININE 1.59* 1.19*  CALCIUM 9.2 9.0    PT/INR:  Recent Labs  11/30/13 1326  LABPROT 13.7  INR 1.07   ABG    Component Value Date/Time   PHART 7.356 12/03/2013 0332   HCO3 30.5* 12/03/2013 0332   TCO2 32.3 12/03/2013 0332   O2SAT 91.3 12/03/2013 0332   CBG (last 3)  No results found for this basename: GLUCAP,  in the last 72 hours  Assessment/Plan: S/P Procedure(s) (LRB): VIDEO ASSISTED THORACOSCOPY (VATS)/WEDGE RESECTION (Right) Plan for transfer to step-down: see transfer orders  POD # 1 wedge resection  CV- stable  Hx CAD- resume plavix  RESP- bibasilar atelectasis- IS  S/p wedge- no air leak- CT to water seal  Chronic hypercarbic respiratory failure-  stable  RENAL_ lytes, creatinine OK  CHRONIC PAIN- resume home pain meds- PCA as supplement  DVT prophylaxis- SCD + enoxaparin    LOS: 1 day    Melrose Nakayama 12/03/2013

## 2013-12-03 NOTE — Progress Notes (Signed)
Called into room by patient after pt had pulled RIJ out. EBL 50-100cc. Pt stable, VSS, no pt complaints. Pressure applied for 15 minutes and vaseline gauze applied. Pt laid flat in bed for 15 minutes. Pt cleaned up and Dr. Roxan Hockey paged. Also, called office to have on call physician paged.

## 2013-12-03 NOTE — Progress Notes (Signed)
Pt arrived via bed from 2S. VSS. 1 chest tube to water seal. Fentanyl full dose PCA.

## 2013-12-03 NOTE — Progress Notes (Signed)
Utilization review completed.  

## 2013-12-04 ENCOUNTER — Inpatient Hospital Stay (HOSPITAL_COMMUNITY): Payer: Medicare Other

## 2013-12-04 ENCOUNTER — Encounter (HOSPITAL_COMMUNITY): Payer: Self-pay | Admitting: Thoracic Surgery (Cardiothoracic Vascular Surgery)

## 2013-12-04 LAB — CBC
HEMATOCRIT: 32.8 % — AB (ref 36.0–46.0)
Hemoglobin: 10.5 g/dL — ABNORMAL LOW (ref 12.0–15.0)
MCH: 30.9 pg (ref 26.0–34.0)
MCHC: 32 g/dL (ref 30.0–36.0)
MCV: 96.5 fL (ref 78.0–100.0)
Platelets: 121 10*3/uL — ABNORMAL LOW (ref 150–400)
RBC: 3.4 MIL/uL — AB (ref 3.87–5.11)
RDW: 15.6 % — ABNORMAL HIGH (ref 11.5–15.5)
WBC: 4.7 10*3/uL (ref 4.0–10.5)

## 2013-12-04 LAB — COMPREHENSIVE METABOLIC PANEL
ALT: 8 U/L (ref 0–35)
AST: 16 U/L (ref 0–37)
Albumin: 2.5 g/dL — ABNORMAL LOW (ref 3.5–5.2)
Alkaline Phosphatase: 126 U/L — ABNORMAL HIGH (ref 39–117)
BUN: 9 mg/dL (ref 6–23)
CO2: 29 mEq/L (ref 19–32)
CREATININE: 1.28 mg/dL — AB (ref 0.50–1.10)
Calcium: 8.8 mg/dL (ref 8.4–10.5)
Chloride: 98 mEq/L (ref 96–112)
GFR calc Af Amer: 53 mL/min — ABNORMAL LOW (ref >90)
GFR calc non Af Amer: 46 mL/min — ABNORMAL LOW (ref >90)
GLUCOSE: 142 mg/dL — AB (ref 70–99)
Potassium: 3.6 mEq/L — ABNORMAL LOW (ref 3.7–5.3)
SODIUM: 138 meq/L (ref 137–147)
Total Bilirubin: 0.5 mg/dL (ref 0.3–1.2)
Total Protein: 5.6 g/dL — ABNORMAL LOW (ref 6.0–8.3)

## 2013-12-04 MED ORDER — ALBUTEROL SULFATE (2.5 MG/3ML) 0.083% IN NEBU
2.5000 mg | INHALATION_SOLUTION | Freq: Four times a day (QID) | RESPIRATORY_TRACT | Status: AC
  Administered 2013-12-04 (×3): 2.5 mg via RESPIRATORY_TRACT
  Filled 2013-12-04 (×4): qty 3

## 2013-12-04 NOTE — Progress Notes (Signed)
Additional copy Medicare IM given to pt and signed copy placed in shadow chart- dated 12/04/13 1230

## 2013-12-04 NOTE — Discharge Summary (Signed)
Physician Discharge Summary  Patient ID: KALEYAH LABRECK MRN: [id number] DOB/AGE: [birth year] 58 y.o.  Admit date: 12/02/2013 Discharge date: 12/06/2013     Admission Diagnoses: Right lower lobe lung mass with history of lymphoma.   Discharge Diagnoses:  Active Problems:   Lung mass Non-hodgkin's b-cell lymphoma   Past Medical History   Diagnosis  Date   .  Coronary artery disease      status post stenting of the aortic and iliac vessels by Dr. Gwenlyn Found. Repeat aortic stenting 2010   .  Left ventricular dysfunction      hx of with ejection fraction of 35% range.   .  Carotid artery disease      hx of carotid cerebrovascular disease with 0-39% bilateral internal carotid artery setenosis   .  Non Hodgkin's lymphoma    .  Chronic back pain      status post lumbar surgery   .  Hyperlipidemia    .  Hypertension    .  COPD (chronic obstructive pulmonary disease)    .  Shortness of breath    .  Cancer      hx of non hogdin lymphoma   .  Stroke    .  CHF (congestive heart failure)    .  Blood transfusion    .  Arthritis    .  Myocardial infarction    .  Peripheral vascular disease    .  GERD (gastroesophageal reflux disease)    .  Anemia    .  Osteoporosis    .  Depression    .  Anxiety    .  Other malignant lymphomas, unspecified site, extranodal and solid organ sites  2004     Back   .  Asthma    .  Vulvar cancer     Past Surgical History   Procedure  Laterality  Date   .  Lumar surg     .  Back surgery       x 2   .  Lung biopsy     .  Cardiac catheterization     .  Abdominal aortogram   08/08/2011   .  Abdominal hysterectomy       partial   .  Portacath placement       pt says its for chemo only and its an old port   .  Dilation and curettage of uterus     .  Vulva /perineum biopsy   04/09/2012     Procedure: VULVAR BIOPSY; Surgeon: Florian Buff, MD; Location: [facility]; Service: Gynecology; Laterality: N/A;   .  Vulvectomy   05/20/2012     Procedure: VULVECTOMY;  Surgeon: Alvino Chapel, MD; Location: [facility]; Service: Gynecology; Laterality: Bilateral; BILATERAL SIMPLE VULVECTOMIES    Family History   Problem  Relation  Age of Onset   .  Coronary artery disease       positive for vascular and cardiac disease   .  Heart disease  Mother    .  Lung cancer  Mother    .  Heart attack  Mother    .  Heart failure  Mother    .  Heart disease  Father    .  Kidney cancer  Father    .  Heart attack  Father    .  Heart failure  Father    .  Diabetes  Sister    .  Aortic aneurysm  Sister    .  Heart attack  Brother    .  Asthma  Son    .  Sleep apnea  Son    .  Endometriosis  Daughter     Social History  History   Substance Use Topics   .  Smoking status:  Current Every Day Smoker -- 2.50 packs/day for 40 years     Types:  Cigarettes   .  Smokeless tobacco:  Never Used   .  Alcohol Use:  No    Current Outpatient Prescriptions   Medication  Sig  Dispense  Refill   .  albuterol (VENTOLIN HFA) 108 (90 BASE) MCG/ACT inhaler  Inhale 2 puffs into the lungs every 6 (six) hours as needed. For asthma/wheezing     .  ALPRAZolam (XANAX) 1 MG tablet  Take 1 mg by mouth 3 (three) times daily.     [name]  amitriptyline (ELAVIL) 50 MG tablet  Take 50 mg by mouth at bedtime.     [name]  aspirin 81 MG EC tablet  Take 81 mg by mouth daily.     [name]  buPROPion (WELLBUTRIN XL) 150 MG 24 hr tablet  Take 150 mg by mouth daily.     .  clopidogrel (PLAVIX) 75 MG tablet  Take 75 mg by mouth daily with breakfast.     .  cycloSPORINE (RESTASIS) 0.05 % ophthalmic emulsion  Place 1 drop into both eyes 2 (two) times daily.     .  diazepam (VALIUM) 5 MG tablet  Take 5 mg by mouth every 6 (six) hours as needed. For muscle spasms     .  furosemide (LASIX) 40 MG tablet  Take 40 mg by mouth daily as needed for fluid.     .  metoprolol (LOPRESSOR) 50 MG tablet  Take 25 mg by mouth 2 (two) times daily.     .  mupirocin ointment (BACTROBAN) 2 %  Apply 1 application topically daily. As  needed for leg infection.     [name]  NEXIUM 40 MG capsule  Take 40 mg by mouth Twice daily.     .  nitroGLYCERIN (NITROSTAT) 0.4 MG SL tablet  Place 0.4 mg under the tongue every 5 (five) minutes as needed for chest pain.     [name]  oxyCODONE (OXYCONTIN) 80 MG 12 hr tablet  Take 80 mg by mouth 4 (four) times daily.     [name]  oxycodone (ROXICODONE) 30 MG immediate release tablet  Take 30 mg by mouth every 4 (four) hours as needed. For breakthrough pain     .  rosuvastatin (CRESTOR) 40 MG tablet  Take 40 mg by mouth daily.     [name]  sulfamethoxazole-trimethoprim (BACTRIM DS) 800-160 MG per tablet  Take 1 tablet by mouth See admin instructions. Takes 1 tablet twice a day on Saturday and Sunday only.     .  vitamin B-12 (CYANOCOBALAMIN) 1000 MCG tablet  Take 1,000 mcg by mouth daily.      No current facility-administered medications for this visit.    No Known Allergies  Pathology: FINAL DIAGNOSIS Diagnosis Lymph node, needle/core biopsy, LN - FOLLICULAR LYMPHOMA, LOW GRADE (GRADE 1-2 OF 3). - SEE ONCOLOGY TABLE. Diagnosis Note LYMPHOMA Histologic type: Follicular lymphoma. Grade (if applicable): Low grade (grade 1-2 of 3). Flow cytometry: [id number]. Monoclonal population of B-cell with expression of CD10. Immunohistochemical stains: CD20, CD3, CD10, CD5, bcl-6, bcl-2 and Ki-67. Touch preps/imprints: N/A. Comments: The needle cores reveal lymphoid tissue with a vague nodular architecture. The  nodules are composed of small lymphocytes with irregular nuclear contours. Only rare large centroblast like cells are seen, consistent with a low grade process. Immunohistochemistry reveals the ill-defined nodules of atypical lymphocytes which are positive for CD20, CD10, bcl-6, and bcl-2. Ki-67 reveals a low proliferation rate (10%) in the atypical areas. CD3 and CD5 highlight admixed T-cells. Flow cytometry ([id number]) reveals a monoclonal B-cell population with expression of CD10. Overall, these findings are  consistent with a low grade follicular lymphoma. [name] was called on 09/30/2013. Vicente Males MD Pathologist, Electronic Signature (Case signed 09/30/2013) Specimen Gross and Clinical Information  Discharged Condition: good    History of the present illness:  The patient is a 62 her old female referred to Dr. Roxan Hockey in cardiothoracic surgical consultation. She has a complex medical history with multiple comorbidities including heavy tobacco abuse, COPD, non-Hodgkin's lymphoma, coronary artery disease, and peripheral arterial disease. She has a history of heavy tobacco abuse up to 3 packs per day starting at age 25. Her current smoking history he is approximately one half pack per day. She was first diagnosed with nodules lymphoma back in 2004. She was treated with a good response. She relapsed in 2009 and was treated again. A recent PET CT scan in followup showed a hypermetabolic left axillary lymph node. There was also a hypermetabolic 1. 5 cm right lower lobe lesion with a max SUV of 3.0. 8 core biopsy was done of the axillary lymph node and it showed recurrent low grade follicular lymphoma. She saw Dr. Barnet Glasgow who advised dealing with the lung nodule before beginning treatment for recurrent lymphoma. She was admitted this hospitalization for surgical resection.   Hospital Course: The patient was taken to the operating room on 12/02/2013 at which time she underwent the following procedure:  DATE OF PROCEDURE: 12/02/2013  DATE OF DISCHARGE:  OPERATIVE REPORT  PREOPERATIVE DIAGNOSIS: Right lower lobe nodule.  POSTOPERATIVE DIAGNOSES: Right lower lobe nodule, probable lymphoma.  PROCEDURE: Right video-assisted thoracoscopy, wedge resection of right  lower lobe nodule, On-Q local anesthetic catheter placement.  SURGEON: Revonda Standard. Roxan Hockey, M.D.  ASSISTANT: Suzzanne Cloud, P.A.  ANESTHESIA: General.  FINDINGS: A 1.5-cm mass in the posterolateral aspect of the right lower  lobe.  Frozen section revealed probable lymphoma, no evidence of primary  lung cancer.  The patient was taken from the  operating room to the postanesthetic care unit in good condition.    Postoperative hospital course:  The patient has remained stable. She has required aggressive pulmonary management do to her significant COPD but is stable in this regard. Oxygen has been weaned and she is currently maintaining good saturations on 2 L nasal cannula. [name] also remained hemodynamically stable and her Plavix has been restarted due to her history of CAD. Pain is under adequate control using PCA with gradual transition to her home medication regimen. She has a mild expected acute blood loss anemia which is stable. Incisions are noted to be healing well without evidence of infection. All routine lines, monitors and drainage devices have been discontinued in the standard fashion. [name] tolerating gradually increasing activities using standard protocols. Final pathology revealed low grade non-Hodgkin's b-cell lymphoma.   Her overall status is felt to be stable for discharge on today's date.   Consults: None  Significant Diagnostic Studies: Routine postoperative chest x-ray surveillance  Treatments: nebs   Disposition: 01-Home or Self Care   Medications at discharge:   Medication List         ALPRAZolam  1 MG tablet  Commonly known as:  XANAX  Take 1 mg by mouth 3 (three) times daily.     amitriptyline 50 MG tablet  Commonly known as:  ELAVIL  Take 50 mg by mouth at bedtime.     aspirin 81 MG EC tablet  Take 81 mg by mouth daily.     buPROPion 150 MG 24 hr tablet  Commonly known as:  WELLBUTRIN XL  Take 150 mg by mouth daily.     clopidogrel 75 MG tablet  Commonly known as:  PLAVIX  Take 75 mg by mouth daily with breakfast.     cycloSPORINE 0.05 % ophthalmic emulsion  Commonly known as:  RESTASIS  Place 1 drop into both eyes 2 (two) times daily.     diazepam 5 MG tablet  Commonly  known as:  VALIUM  Take 5 mg by mouth every 6 (six) hours as needed. For muscle spasms     furosemide 40 MG tablet  Commonly known as:  LASIX  Take 40 mg by mouth daily as needed for fluid.     isosorbide mononitrate 30 MG 24 hr tablet  Commonly known as:  IMDUR  Take 0.5 tablets (15 mg total) by mouth daily.     metoprolol 50 MG tablet  Commonly known as:  LOPRESSOR  Take 25 mg by mouth 2 (two) times daily.     mupirocin ointment 2 %  Commonly known as:  BACTROBAN  Apply 1 application topically daily. As needed for leg infection.     NEXIUM 40 MG capsule  Generic drug:  esomeprazole  Take 40 mg by mouth Twice daily.     nitroGLYCERIN 0.4 MG SL tablet  Commonly known as:  NITROSTAT  Place 0.4 mg under the tongue every 5 (five) minutes as needed for chest pain.     rosuvastatin 40 MG tablet  Commonly known as:  CRESTOR  Take 40 mg by mouth daily.     ROXICODONE 30 MG immediate release tablet  Generic drug:  oxycodone  Take 30 mg by mouth every 4 (four) hours as needed. For breakthrough pain     oxyCODONE 80 MG 12 hr tablet  Commonly known as:  OXYCONTIN  Take 80 mg by mouth 4 (four) times daily.     sulfamethoxazole-trimethoprim 800-160 MG per tablet  Commonly known as:  BACTRIM DS  Take 1 tablet by mouth 2 (two) times a week. Takes 1 tablet twice a day on Saturday and Sunday only.     VENTOLIN HFA 108 (90 BASE) MCG/ACT inhaler  Generic drug:  albuterol  Inhale 2 puffs into the lungs every 6 (six) hours as needed. For asthma/wheezing         Follow Up:     Follow-up Information   Follow up with Melrose Nakayama, MD. (The office will arrange to make appointment to see Dr. Roxan Hockey. Please obtain a chest x-ray and [facility] 1 hour prior to this appointment. [facility] is located in the same office complex.)    Specialty:  Cardiothoracic Surgery   Contact information:   [address] [place] [address] [phone]        Follow up with [facility]. (1 week appointment will be arranged to see the nurse at Dr. Leonarda Salon office for suture removal.)       Signed: John Giovanni 12/04/2013, 9:06 AM

## 2013-12-04 NOTE — Discharge Instructions (Signed)
Thoracoscopy °Care After °Refer to this sheet in the next few weeks. These discharge instructions provide you with general information on caring for yourself after you leave the hospital. Your caregiver may also give you specific instructions. Your treatment has been planned according to the most current medical practices available, but unavoidable complications sometimes occur. If you have any problems or questions after discharge, call your caregiver. °HOME CARE INSTRUCTIONS  °· Remove the bandage (dressing) over your chest tube site as directed by your caregiver. °· It is normal to be sore for a couple weeks following surgery. See your caregiver if this seems to be getting worse rather than better. °· Only take over-the-counter or prescription medicines for pain, discomfort, or fever as directed by your caregiver. It is very important to take pain medicine when you need it so that you will cough and breathe deeply enough to clear mucus (phlegm) and expand your lungs. °· If it hurts to cough, hold a pillow against your chest when you cough. This may help with the discomfort. In spite of the discomfort, cough frequently, as this helps protect against getting an infection in your lung (pneumonia). °· Taking deep breaths keeps lungs inflated and protects against pneumonia. Most patients will go home with an incentive spirometer that encourages deep breathing. °· You may resume a normal diet and activities as directed. °· Use showers for bathing until you see your caregiver, or as instructed. °· Change dressings if necessary or as directed. °· Avoid lifting or driving until you are instructed otherwise. °· Make an appointment to see your caregiver for stitch (suture) or staple removal when instructed. °· Do not travel by airplane for 2 weeks after the chest tube is removed. °SEEK MEDICAL CARE IF:  °· You are bleeding from your wounds. °· You have redness, swelling, or increasing pain in the wounds. °· Your heartbeat  feels irregular or very fast. °· There is pus coming from your wounds. °· There is a bad smell coming from the wound or dressing. °SEEK IMMEDIATE MEDICAL CARE IF:  °· You have a fever. °· You develop a rash. °· You have difficulty breathing. °· You develop any reaction or side effects to medicines given. °· You develop lightheadedness or feel faint. °· You develop shortness of breath or chest pain. °MAKE SURE YOU:  °· Understand these instructions. °· Will watch your condition. °· Will get help right away if you are not doing well or get worse. °Document Released: 01/05/2005 Document Revised: 09/10/2011 Document Reviewed: 12/06/2010 °[employer]® Patient Information ©2014 [employer]. ° °

## 2013-12-04 NOTE — Progress Notes (Signed)
2 Days Post-Op Procedure(s) (LRB): VIDEO ASSISTED THORACOSCOPY (VATS)/WEDGE RESECTION (Right) Subjective: Some incisional pain Frequent cough  Objective: Vital signs in last 24 hours: Temp:  [98.2 F (36.8 C)-98.7 F (37.1 C)] 98.2 F (36.8 C) (06/05 0430) Pulse Rate:  [56-83] 59 (06/05 0748) Cardiac Rhythm:  [-] Normal sinus rhythm (06/05 0750) Resp:  [13-22] 18 (06/05 0800) BP: (93-131)/(43-94) 131/94 mmHg (06/05 0748) SpO2:  [93 %-96 %] 95 % (06/05 0800) Arterial Line BP: (83)/(52) 83/52 mmHg (06/04 0900) Weight:  [174 lb 2.6 oz (79 kg)] 174 lb 2.6 oz (79 kg) (06/04 1123)  Hemodynamic parameters for last 24 hours:    Intake/Output from previous day: 06/04 0701 - 06/05 0700 In: 1066.2 [P.O.:240; I.V.:776.2; IV Piggyback:50] Out: 2650 [Urine:2600; Chest Tube:50] Intake/Output this shift: Total I/O In: 491.7 [P.O.:300; I.V.:191.7] Out: 270 [Urine:250; Chest Tube:20]  General appearance: alert and no distress Neurologic: intact Heart: regular rate and rhythm Lungs: rhonchi bilaterally no air leak, minimal serous drainage  Lab Results:  Recent Labs  12/03/13 0415 12/04/13 0335  WBC 4.8 4.7  HGB 11.3* 10.5*  HCT 34.5* 32.8*  PLT 114* 121*   BMET:  Recent Labs  12/03/13 0415 12/04/13 0335  NA 140 138  K 3.9 3.6*  CL 103 98  CO2 29 29  GLUCOSE 146* 142*  BUN 8 9  CREATININE 1.19* 1.28*  CALCIUM 9.0 8.8    PT/INR: No results found for this basename: LABPROT, INR,  in the last 72 hours ABG    Component Value Date/Time   PHART 7.356 12/03/2013 0332   HCO3 30.5* 12/03/2013 0332   TCO2 32.3 12/03/2013 0332   O2SAT 91.3 12/03/2013 0332   CBG (last 3)  No results found for this basename: GLUCAP,  in the last 72 hours  Assessment/Plan: S/P Procedure(s) (LRB): VIDEO ASSISTED THORACOSCOPY (VATS)/WEDGE RESECTION (Right) POD # 2 No air leak and minimal drainage- dc CT CXR does show a trivial right apical pneumo Continue PCA for another 24 hours Change  albuterol to QID for 4 doses IV to KVO mobilize   LOS: 2 days    Melrose Nakayama 12/04/2013

## 2013-12-05 ENCOUNTER — Inpatient Hospital Stay (HOSPITAL_COMMUNITY): Payer: Medicare Other

## 2013-12-05 LAB — BASIC METABOLIC PANEL
BUN: 12 mg/dL (ref 6–23)
CO2: 28 mEq/L (ref 19–32)
Calcium: 8.9 mg/dL (ref 8.4–10.5)
Chloride: 101 mEq/L (ref 96–112)
Creatinine, Ser: 1.25 mg/dL — ABNORMAL HIGH (ref 0.50–1.10)
GFR calc non Af Amer: 47 mL/min — ABNORMAL LOW (ref >90)
GFR, EST AFRICAN AMERICAN: 55 mL/min — AB (ref >90)
GLUCOSE: 114 mg/dL — AB (ref 70–99)
Potassium: 3.5 mEq/L — ABNORMAL LOW (ref 3.7–5.3)
SODIUM: 140 meq/L (ref 137–147)

## 2013-12-05 LAB — CBC
HCT: 30.7 % — ABNORMAL LOW (ref 36.0–46.0)
HEMOGLOBIN: 9.8 g/dL — AB (ref 12.0–15.0)
MCH: 30.3 pg (ref 26.0–34.0)
MCHC: 31.9 g/dL (ref 30.0–36.0)
MCV: 95 fL (ref 78.0–100.0)
Platelets: 122 10*3/uL — ABNORMAL LOW (ref 150–400)
RBC: 3.23 MIL/uL — ABNORMAL LOW (ref 3.87–5.11)
RDW: 15.5 % (ref 11.5–15.5)
WBC: 4.2 10*3/uL (ref 4.0–10.5)

## 2013-12-05 NOTE — Progress Notes (Addendum)
       [address]       [place] [address]             [phone]          3 Days Post-Op Procedure(s) (LRB): VIDEO ASSISTED THORACOSCOPY (VATS)/WEDGE RESECTION (Right)  Subjective: Comfortable, +productive cough this am.   Objective: Vital signs in last 24 hours: Patient Vitals for the past 24 hrs:  BP Temp Temp src Pulse Resp SpO2  12/05/13 0705 106/45 mmHg - - 62 13 92 %  12/05/13 0700 - 98.2 F (36.8 C) Oral - - -  12/05/13 0440 95/56 mmHg - - 62 16 91 %  12/04/13 2309 129/56 mmHg 98 F (36.7 C) Oral 68 10 91 %  12/04/13 2117 - - - - - 97 %  12/04/13 2054 134/53 mmHg 97.6 F (36.4 C) Oral 63 17 96 %  12/04/13 1600 - 97.7 F (36.5 C) Oral - - -  12/04/13 1510 - - - - 20 96 %  12/04/13 1245 90/58 mmHg 97.9 F (36.6 C) Oral 63 - 94 %  12/04/13 1218 - 98.6 F (37 C) Oral - - -  12/04/13 1200 - - - - 13 99 %   Current Weight  12/03/13 174 lb 2.6 oz (79 kg)     Intake/Output from previous day: 06/05 0701 - 06/06 0700 In: 621.7 [P.O.:300; I.V.:321.7] Out: 620 [Urine:600; Chest Tube:20]    PHYSICAL EXAM:  Heart: RRR Lungs: Coarse BS that clear with cough Wound: Dressed and dry     Lab Results: CBC: Recent Labs  12/04/13 0335 12/05/13 0318  WBC 4.7 4.2  HGB 10.5* 9.8*  HCT 32.8* 30.7*  PLT 121* 122*   BMET:  Recent Labs  12/04/13 0335 12/05/13 0318  NA 138 140  K 3.6* 3.5*  CL 98 101  CO2 29 28  GLUCOSE 142* 114*  BUN 9 12  CREATININE 1.28* 1.25*  CALCIUM 8.8 8.9    PT/INR: No results found for this basename: LABPROT, INR,  in the last 72 hours  Path: Low grade Non- Hodgkins' b-cell lymphoma  Assessment/Plan: S/P Procedure(s) (LRB): VIDEO ASSISTED THORACOSCOPY (VATS)/WEDGE RESECTION (Right) Doing well overall.   CXR not done yet this am. Wean  O2, continue pulm toilet.  D/c PCA, saline lock IVF. Hopefully  Home in am if remains stable.   LOS: 3 days    Coolidge Breeze 12/05/2013

## 2013-12-06 NOTE — Progress Notes (Addendum)
       [address]       [place] [address]             [phone]          4 Days Post-Op Procedure(s) (LRB): VIDEO ASSISTED THORACOSCOPY (VATS)/WEDGE RESECTION (Right)  Subjective: Comfortable, no complaints.   Objective: Vital signs in last 24 hours: Patient Vitals for the past 24 hrs:  BP Temp Temp src Pulse Resp SpO2  12/06/13 0343 107/56 mmHg 97.9 F (36.6 C) Oral 69 14 96 %  12/06/13 0027 116/59 mmHg 97.6 F (36.4 C) Oral 71 12 94 %  12/05/13 2034 104/38 mmHg 97.7 F (36.5 C) Oral 68 14 90 %  12/05/13 2000 - - - - - 98 %  12/05/13 1531 - 97.8 F (36.6 C) Oral - - -  12/05/13 1530 95/39 mmHg - - 59 13 90 %  12/05/13 1120 102/70 mmHg 98.1 F (36.7 C) Oral 96 18 97 %   Current Weight  12/03/13 174 lb 2.6 oz (79 kg)     Intake/Output from previous day: 06/06 0701 - 06/07 0700 In: 620.6 [P.O.:355; I.V.:265.6] Out: -     PHYSICAL EXAM:  Heart: RRR Lungs: Clear, BS diminished in bases Wound: Clean and dry     Lab Results: CBC: Recent Labs  12/04/13 0335 12/05/13 0318  WBC 4.7 4.2  HGB 10.5* 9.8*  HCT 32.8* 30.7*  PLT 121* 122*   BMET:  Recent Labs  12/04/13 0335 12/05/13 0318  NA 138 140  K 3.6* 3.5*  CL 98 101  CO2 29 28  GLUCOSE 142* 114*  BUN 9 12  CREATININE 1.28* 1.25*  CALCIUM 8.8 8.9    PT/INR: No results found for this basename: LABPROT, INR,  in the last 72 hours  CXR (6/6): FINDINGS:  In the medial aspect of the right lung base there is a mass like  opacity projecting over the right heart border which is increasingly  conspicuous over the prior several examinations, and may correspond  to the previously noted hypermetabolic nodule in the right lower  lobe on prior PET-CT 09/15/2013. Lungs otherwise appear clear.  Specifically, no definite consolidative airspace disease or pleural  effusions. No evidence of pulmonary edema. Post procedural changes  of wedge resection in the lateral aspect of the  left mid lung are  again noted. Heart size and upper mediastinal contours are within  normal limits. Atherosclerosis in the thoracic aorta. Left-sided  single-lumen subclavian Port-A-Cath with tip terminating in the  distal superior vena cava.  IMPRESSION:  1. No radiographic evidence of acute cardiopulmonary disease.  2. Increasingly conspicuous mass like opacity in the medial aspect  of the right lower lobe, concerning for growing neoplasm,  particularly given the hypermetabolic nodule noted in this region on  prior PET-CT 09/15/2013. This could be better evaluated with  followup chest CT (preferably with contrast) if clinically  appropriate.     Assessment/Plan: S/P Procedure(s) (LRB): VIDEO ASSISTED THORACOSCOPY (VATS)/WEDGE RESECTION (Right) Pt doing well overall.  Back on 1L O2 overnight.  Will wean and d/c O2 this am Home later today if off oxygen.   LOS: 4 days    Coolidge Breeze 12/06/2013  Ready for discharge home today, saturations adequate off oxygen

## 2013-12-08 ENCOUNTER — Other Ambulatory Visit: Payer: Self-pay | Admitting: Cardiovascular Disease

## 2013-12-09 ENCOUNTER — Ambulatory Visit (INDEPENDENT_AMBULATORY_CARE_PROVIDER_SITE_OTHER): Payer: Self-pay

## 2013-12-09 DIAGNOSIS — Z4802 Encounter for removal of sutures: Secondary | ICD-10-CM

## 2013-12-09 DIAGNOSIS — D381 Neoplasm of uncertain behavior of trachea, bronchus and lung: Secondary | ICD-10-CM

## 2013-12-09 NOTE — Progress Notes (Signed)
Removed 1 suture from chest tube site, no signs of infection and patient tolerated well. 

## 2013-12-14 ENCOUNTER — Other Ambulatory Visit: Payer: Self-pay | Admitting: Thoracic Surgery (Cardiothoracic Vascular Surgery)

## 2013-12-14 DIAGNOSIS — J8482 Adult pulmonary Langerhans cell histiocytosis: Secondary | ICD-10-CM

## 2013-12-15 ENCOUNTER — Ambulatory Visit (INDEPENDENT_AMBULATORY_CARE_PROVIDER_SITE_OTHER): Payer: Self-pay | Admitting: Thoracic Surgery (Cardiothoracic Vascular Surgery)

## 2013-12-15 ENCOUNTER — Ambulatory Visit
Admission: RE | Admit: 2013-12-15 | Discharge: 2013-12-15 | Disposition: A | Discharge status: Home / Self Care | Payer: Medicare Other | Source: Ambulatory Visit | Attending: Thoracic Surgery (Cardiothoracic Vascular Surgery) | Admitting: Thoracic Surgery (Cardiothoracic Vascular Surgery)

## 2013-12-15 ENCOUNTER — Encounter: Payer: Self-pay | Admitting: Thoracic Surgery (Cardiothoracic Vascular Surgery)

## 2013-12-15 VITALS — BP 93/60 | HR 61 | Resp 16 | Ht 64.5 in | Wt 162.0 lb

## 2013-12-15 DIAGNOSIS — J8482 Adult pulmonary Langerhans cell histiocytosis: Secondary | ICD-10-CM

## 2013-12-15 DIAGNOSIS — C8590 Non-Hodgkin lymphoma, unspecified, unspecified site: Secondary | ICD-10-CM

## 2013-12-15 DIAGNOSIS — C8589 Other specified types of non-Hodgkin lymphoma, extranodal and solid organ sites: Secondary | ICD-10-CM

## 2013-12-15 DIAGNOSIS — Z09 Encounter for follow-up examination after completed treatment for conditions other than malignant neoplasm: Secondary | ICD-10-CM

## 2013-12-15 NOTE — Progress Notes (Signed)
HPI:  [name] returns today for a scheduled postoperative followup visit.  [name] a 58 year old woman with a history of non-Hodgkin's lymphoma. We did a right thoracoscopic wedge resection for lung nodule in the right lower lobe on June 2. The nodule turned out to be non-Hodgkin's lymphoma.  She had some difficulties with pain initially postoperatively, but that improved when she was back on her baseline OxyContin dose.  Since discharge she says she's been doing well. She drove down here today without any problems. Her pain is well-controlled. She denies any cough or shortness of breath.  Past Medical History  Diagnosis Date  . Coronary artery disease     status post stenting of the aortic and iliac vessels by Dr. Gwenlyn Yu. Repeat aortic stenting 2010  . Left ventricular dysfunction     hx of with ejection fraction of 35% range.  . Carotid artery disease     hx of carotid cerebrovascular disease with 0-39% bilateral internal carotid artery setenosis   . Non Hodgkin's lymphoma   . Chronic back pain     status post lumbar surgery  . Hyperlipidemia   . Hypertension   . COPD (chronic obstructive pulmonary disease)   . Shortness of breath   . Cancer     hx of non hogdin lymphoma  . CHF (congestive heart failure)   . Blood transfusion   . Arthritis   . Myocardial infarction   . Peripheral vascular disease   . GERD (gastroesophageal reflux disease)   . Anemia   . Osteoporosis   . Depression   . Anxiety   . Other malignant lymphomas, unspecified site, extranodal and solid organ sites 2004    Back  . Asthma   . Vulvar cancer   . Anginal pain     occ last time last wk  . Dysrhythmia     ?  . Stroke 04  . Pneumonia     hx  . History of kidney stones   . Seizures     ? yrs ago      Current Outpatient Prescriptions  Medication Sig Dispense Refill  . albuterol (VENTOLIN HFA) 108 (90 BASE) MCG/ACT inhaler Inhale 2 puffs into the lungs every 6 (six) hours as needed. For  asthma/wheezing      . ALPRAZolam (XANAX) 1 MG tablet Take 1 mg by mouth 3 (three) times daily.       [name] amitriptyline (ELAVIL) 50 MG tablet Take 50 mg by mouth at bedtime.      [name] aspirin 81 MG EC tablet Take 81 mg by mouth daily.        [name] buPROPion (WELLBUTRIN XL) 150 MG 24 hr tablet Take 150 mg by mouth daily.      . clopidogrel (PLAVIX) 75 MG tablet TAKE 1 TABLET DAILY WITH BREAKFAST.  30 tablet  0  . cycloSPORINE (RESTASIS) 0.05 % ophthalmic emulsion Place 1 drop into both eyes 2 (two) times daily.      . diazepam (VALIUM) 5 MG tablet Take 5 mg by mouth every 6 (six) hours as needed. For muscle spasms      . furosemide (LASIX) 40 MG tablet Take 40 mg by mouth daily as needed for fluid.       . isosorbide mononitrate (IMDUR) 30 MG 24 hr tablet Take 0.5 tablets (15 mg total) by mouth daily.  45 tablet  3  . metoprolol (LOPRESSOR) 50 MG tablet Take 25 mg by mouth 2 (two) times daily.      [name]  mupirocin ointment (BACTROBAN) 2 % Apply 1 application topically daily. As needed for leg infection.      [name] NEXIUM 40 MG capsule Take 40 mg by mouth Twice daily.      . nitroGLYCERIN (NITROSTAT) 0.4 MG SL tablet Place 0.4 mg under the tongue every 5 (five) minutes as needed for chest pain.      [name] oxyCODONE (OXYCONTIN) 80 MG 12 hr tablet Take 80 mg by mouth 4 (four) times daily.      [name] oxycodone (ROXICODONE) 30 MG immediate release tablet Take 30 mg by mouth every 4 (four) hours as needed. For breakthrough pain      . rosuvastatin (CRESTOR) 40 MG tablet Take 40 mg by mouth daily.        [name] sulfamethoxazole-trimethoprim (BACTRIM DS) 800-160 MG per tablet Take 1 tablet by mouth 2 (two) times a week. Takes 1 tablet twice a day on Saturday and Sunday only.      . naproxen (NAPROSYN) 500 MG tablet       . predniSONE (DELTASONE) 10 MG tablet        No current facility-administered medications for this visit.    Physical Exam BP 93/60  Pulse 61  Resp 16  Ht 5' 4.5" (1.638 m)  Wt 162 lb (73.483 kg)  BMI 27.39  kg/m2  SpO3 44% 58 year old woman in no acute distress Alert and oriented x3 Cardiac regular rate and rhythm normal S1 and S2 Lungs clear with equal breath sounds bilaterally Incisions healing well  Diagnostic Tests: CHEST 2 VIEW  COMPARISON: 12/05/2013  FINDINGS:  Mild cardiac enlargement is stable. Mild scarring lateral lingula  unchanged. Port-A-Cath in unchanged position.  3 cm masslike opacity over the right heart border/medial right lung  base, stable from prior study.  IMPRESSION:  No change from 12/05/2013 with masslike opacity medial right lung  base.  Electronically Signed  By: [name] M.D.  On: 12/15/2013 13:08   Impression: 58 year old woman with non-Hodgkin's lymphoma. [name] now 2 weeks post wedge resection for lung nodule that turned out to be lymphoma. She's doing extremely well at this point in time.  From my standpoint there really are no restrictions on her activities. She should exercise extreme caution will driving due to her narcotic dosages, but she has been on those for a long period of time.  She has a followup appointment with Dr. Barnet Yu at the [place] [facility] next week to discuss management of her non-Hodgkin's lymphoma.  I will be happy to see her back any time if I can be of any further assistance with her care.

## 2013-12-22 ENCOUNTER — Encounter (HOSPITAL_COMMUNITY): Payer: Medicare Other | Attending: Hematology and Oncology

## 2013-12-22 ENCOUNTER — Encounter (HOSPITAL_COMMUNITY): Payer: Self-pay

## 2013-12-22 ENCOUNTER — Telehealth (HOSPITAL_COMMUNITY): Payer: Self-pay | Admitting: *Deleted

## 2013-12-22 VITALS — BP 139/56 | HR 71 | Temp 98.8°F | Resp 18 | Wt 167.0 lb

## 2013-12-22 DIAGNOSIS — J449 Chronic obstructive pulmonary disease, unspecified: Secondary | ICD-10-CM

## 2013-12-22 DIAGNOSIS — J8482 Adult pulmonary Langerhans cell histiocytosis: Secondary | ICD-10-CM

## 2013-12-22 DIAGNOSIS — C8294 Follicular lymphoma, unspecified, lymph nodes of axilla and upper limb: Secondary | ICD-10-CM

## 2013-12-22 DIAGNOSIS — Z87898 Personal history of other specified conditions: Secondary | ICD-10-CM | POA: Insufficient documentation

## 2013-12-22 DIAGNOSIS — C519 Malignant neoplasm of vulva, unspecified: Secondary | ICD-10-CM

## 2013-12-22 DIAGNOSIS — Z8544 Personal history of malignant neoplasm of other female genital organs: Secondary | ICD-10-CM

## 2013-12-22 DIAGNOSIS — C8590 Non-Hodgkin lymphoma, unspecified, unspecified site: Secondary | ICD-10-CM

## 2013-12-22 DIAGNOSIS — R222 Localized swelling, mass and lump, trunk: Secondary | ICD-10-CM | POA: Insufficient documentation

## 2013-12-22 DIAGNOSIS — R918 Other nonspecific abnormal finding of lung field: Secondary | ICD-10-CM

## 2013-12-22 DIAGNOSIS — F17200 Nicotine dependence, unspecified, uncomplicated: Secondary | ICD-10-CM

## 2013-12-22 DIAGNOSIS — C8589 Other specified types of non-Hodgkin lymphoma, extranodal and solid organ sites: Secondary | ICD-10-CM | POA: Insufficient documentation

## 2013-12-22 NOTE — Progress Notes (Signed)
[name]  OFFICE PROGRESS NOTE  [name], MD [place] [address]  DIAGNOSIS: Mass of lung - Plan: Lactate dehydrogenase  Lymphoma, left axilla - Plan: Comprehensive metabolic panel, Lactate dehydrogenase, Beta 2 microglobulin, serum, Reticulocytes  Langerhans cell histiocytosis of lung  Vulvar cancer  No chief complaint on file.   CURRENT THERAPY: Resection of right lower lobe nodule revealing follicular lymphoma.  INTERVAL HISTORY: [name] 58 y.o. female returns for followup after undergoing right lower lobe wedge resection of a nodule which turned out to be follicular lymphoma identical to that removed from the left axilla on 09/28/2013. There is also history of Langerhans histiocytosis. Unfortunately wedge resection revealed evidence of well-differentiated lymphocytic lymphoma. Patient feels well and continues to smoke however. She denies any fever, night sweats, abdominal pain, lower extremity swelling or redness, chest pain, PND, orthopnea, palpitations, skin rash, joint pain, headache, or seizures.  MEDICAL HISTORY: Past Medical History  Diagnosis Date   Coronary artery disease     status post stenting of the aortic and iliac vessels by Dr. Gwenlyn Found. Repeat aortic stenting 2010   Left ventricular dysfunction     hx of with ejection fraction of 35% range.   Carotid artery disease     hx of carotid cerebrovascular disease with 0-39% bilateral internal carotid artery setenosis    Non Hodgkin's lymphoma    Chronic back pain     status post lumbar surgery   Hyperlipidemia    Hypertension    COPD (chronic obstructive pulmonary disease)    Shortness of breath    Cancer     hx of non hogdin lymphoma   CHF (congestive heart failure)    Blood transfusion    Arthritis    Myocardial infarction    Peripheral vascular disease    GERD (gastroesophageal reflux disease)      Anemia    Osteoporosis    Depression    Anxiety    Other malignant lymphomas, unspecified site, extranodal and solid organ sites 2004    Back   Asthma    Vulvar cancer    Anginal pain     occ last time last wk   Dysrhythmia     ?   Stroke 04   Pneumonia     hx   History of kidney stones    Seizures     ? yrs ago    INTERIM HISTORY: has HYPERLIPIDEMIA; HYPERTENSION, BENIGN; CAD, NATIVE VESSEL; LEFT VENTRICULAR FUNCTION, DECREASED; CAROTID ARTERY DISEASE; AORTIC ATHEROSCLEROSIS; ATHEROSCLEROSIS, RENAL ARTERY; Atherosclerosis of native arteries of the extremities with rest pain; PVD; COPD; NON-HODGKIN'S LYMPHOMA, HX OF; Vulvar intraepithelial neoplasia III (VIN III); Vulvar cancer; Mass of lung, right upper lobe; Langerhans cell histiocytosis of lung, 2009; and Lung mass on her problem list.   In September of 2004 the patient presented with a several month history of left-sided back pain and was seen in the emergency room at which time a paravertebral left-sided mass at T3 was seen. A core biopsy was consistent with follicular non-Hodgkin's lymphoma with some elements of large cell. PET CT scan revealed mediastinal lymph nodes. Treated with 4 cycles of CHOP-R. started on 03/22/2003 through 05/25/2003 with vincristine dose decreased for the last 2 cycles due to peripheral neuropathy. She received Rituxan maintenance at 2 month intervals for one year and beginning in March of 2006 Rituxan was given at 3 month intervals with treatment ending  in December 2006.  She relapsed with a mass at T7 presenting again with back pain at that time treated with Cytoxan and Fludara along with Rituxan starting in July of 2009. Because of myelotoxicity Cytoxan was held for one cycle with repeat study showing no evidence of disease but with persistent thrombocytopenia in the 50-80,000 range. Her paraspinal mass disappeared.. Repeat CT scans on 03/10/2012 showed no evidence of lymphoma.  Sometime in 2009,  the patient developed pulmonary lesions and was operated on by Dr. Arlyce Dice at which time Langerhans histiocytosis was diagnosed. Patient was treated with prednisone tapering him 60 mg down to 10 mg a day continued till July of 2013.  PET scan was ordered to restage the patient in 2015 with findings of abnormality in the right upper lobe of the lung as well as the left axilla. Left axilla biopsy was consistent with follicular lymphoma and the patient is currently being evaluated by thoracic surgery for resection of the pulmonary nodule which appears to be a primary lung cancer. Wedge resection of the right lower lobe nodule performed on 07/07/1094 revealed follicular lymphoma identical to left axillary biopsy performed on 09/28/2013.  ALLERGIES:  has No Known Allergies.  MEDICATIONS: has a current medication list which includes the following prescription(s): albuterol, alprazolam, amitriptyline, aspirin, bupropion, clopidogrel, cyclosporine, diazepam, furosemide, isosorbide mononitrate, metoprolol, mupirocin ointment, nexium, nitroglycerin, oxycodone, oxycodone, rosuvastatin, sulfamethoxazole-trimethoprim, and prednisone.  SURGICAL HISTORY:  Past Surgical History  Procedure Laterality Date   [facility]umar surg     Back surgery      x 2   Lung biopsy     Cardiac catheterization     Abdominal aortogram  08/08/2011   Abdominal hysterectomy      partial   Portacath placement      pt says its for chemo only and its an old port(04)    Dilation and curettage of uterus     Vulva /perineum biopsy  04/09/2012    Procedure: VULVAR BIOPSY;  Surgeon: Florian Buff, MD;  Location: [facility];  Service: Gynecology;  Laterality: N/A;   Vulvectomy  05/20/2012    Procedure: VULVECTOMY;  Surgeon: Alvino Chapel, MD;  Location: [facility];  Service: Gynecology;  Laterality: Bilateral;  BILATERAL SIMPLE VULVECTOMIES   Video assisted thoracoscopy (vats)/wedge resection Right 12/02/2013    Procedure: VIDEO ASSISTED  THORACOSCOPY (VATS)/WEDGE RESECTION;  Surgeon: Melrose Nakayama, MD;  Location: [facility];  Service: Thoracic;  Laterality: Right;    FAMILY HISTORY: family history includes Aortic aneurysm in her sister; Asthma in her son; Coronary artery disease in an other family member; Diabetes in her sister; Endometriosis in her daughter; Heart attack in her brother, father, and mother; Heart disease in her father and mother; Heart failure in her father and mother; Kidney cancer in her father; Lung cancer in her mother; Sleep apnea in her son.  SOCIAL HISTORY:  reports that she has been smoking Cigarettes.  She has a 100 pack-year smoking history. She has never used smokeless tobacco. She reports that she does not drink alcohol or use illicit drugs.  REVIEW OF SYSTEMS:  Other than that discussed above is noncontributory.  PHYSICAL EXAMINATION: ECOG PERFORMANCE STATUS: 1 - Symptomatic but completely ambulatory  Blood pressure 139/56, pulse 71, temperature 98.8 F (37.1 C), temperature source Oral, resp. rate 18, weight 167 lb (75.751 kg).  GENERAL:alert, no distress and comfortable SKIN: skin color, texture, turgor are normal, no rashes or significant lesions EYES: PERLA; Conjunctiva are pink and non-injected, sclera clear SINUSES:  No redness or tenderness over maxillary or ethmoid sinuses OROPHARYNX:no exudate, no erythema on lips, buccal mucosa, or tongue. NECK: supple, thyroid normal size, non-tender, without nodularity. No masses CHEST: Increased AP diameter with light port in place and left anterior chest. LYMPH:  no palpable lymphadenopathy in the cervical, axillary or inguinal. Left axillary lymph nodes which were biopsied are not palpable. There are found only on PET scan. LUNGS: clear to auscultation and percussion with normal breathing effort HEART: regular rate & rhythm and no murmurs. ABDOMEN:abdomen soft, non-tender and normal bowel sounds MUSCULOSKELETAL:no cyanosis of digits and no  clubbing. Range of motion normal.  NEURO: alert & oriented x 3 with fluent speech, no focal motor/sensory deficits   LABORATORY DATA: Admission on 12/02/2013, Discharged on 12/06/2013  Component Date Value Ref Range Status   WBC 12/03/2013 4.8  4.0 - 10.5 K/uL Final   RBC 12/03/2013 3.63* 3.87 - 5.11 MIL/uL Final   Hemoglobin 12/03/2013 11.3* 12.0 - 15.0 g/dL Final   HCT 12/03/2013 34.5* 36.0 - 46.0 % Final   MCV 12/03/2013 95.0  78.0 - 100.0 fL Final   MCH 12/03/2013 31.1  26.0 - 34.0 pg Final   MCHC 12/03/2013 32.8  30.0 - 36.0 g/dL Final   RDW 12/03/2013 15.3  11.5 - 15.5 % Final   Platelets 12/03/2013 114* 150 - 400 K/uL Final   Comment: PLATELET COUNT CONFIRMED BY SMEAR                          REPEATED TO VERIFY   O2 Content 12/03/2013 2.0   Final   Delivery systems 12/03/2013 NASAL CANNULA   Final   pH, Arterial 12/03/2013 7.356  7.350 - 7.450 Final   pCO2 arterial 12/03/2013 56.0* 35.0 - 45.0 mmHg Final   pO2, Arterial 12/03/2013 66.3* 80.0 - 100.0 mmHg Final   Bicarbonate 12/03/2013 30.5* 20.0 - 24.0 mEq/L Final   TCO2 12/03/2013 32.3  0 - 100 mmol/L Final   Acid-Base Excess 12/03/2013 5.3* 0.0 - 2.0 mmol/L Final   O2 Saturation 12/03/2013 91.3   Final   Patient temperature 12/03/2013 98.6   Final   Collection site 12/03/2013 A-LINE   Final   Drawn by 12/03/2013 40415   Final   Sample type 12/03/2013 ARTERIAL DRAW   Final   Allens test (pass/fail) 12/03/2013 PASS  PASS Final   Sodium 12/03/2013 140  137 - 147 mEq/L Final   Potassium 12/03/2013 3.9  3.7 - 5.3 mEq/L Final   Chloride 12/03/2013 103  96 - 112 mEq/L Final   CO2 12/03/2013 29  19 - 32 mEq/L Final   Glucose, Bld 12/03/2013 146* 70 - 99 mg/dL Final   BUN 12/03/2013 8  6 - 23 mg/dL Final   Creatinine, Ser 12/03/2013 1.19* 0.50 - 1.10 mg/dL Final   Calcium 12/03/2013 9.0  8.4 - 10.5 mg/dL Final   GFR calc non Af Amer 12/03/2013 50* >90 mL/min Final   GFR calc Af Amer 12/03/2013  58* >90 mL/min Final   Comment: (NOTE)                          The eGFR has been calculated using the CKD EPI equation.                          This calculation has not been validated in all clinical situations.  eGFR's persistently <90 mL/min signify possible Chronic Kidney                          Disease.   WBC 12/04/2013 4.7  4.0 - 10.5 K/uL Final   RBC 12/04/2013 3.40* 3.87 - 5.11 MIL/uL Final   Hemoglobin 12/04/2013 10.5* 12.0 - 15.0 g/dL Final   HCT 12/04/2013 32.8* 36.0 - 46.0 % Final   MCV 12/04/2013 96.5  78.0 - 100.0 fL Final   MCH 12/04/2013 30.9  26.0 - 34.0 pg Final   MCHC 12/04/2013 32.0  30.0 - 36.0 g/dL Final   RDW 12/04/2013 15.6* 11.5 - 15.5 % Final   Platelets 12/04/2013 121* 150 - 400 K/uL Final   Sodium 12/04/2013 138  137 - 147 mEq/L Final   Potassium 12/04/2013 3.6* 3.7 - 5.3 mEq/L Final   Chloride 12/04/2013 98  96 - 112 mEq/L Final   CO2 12/04/2013 29  19 - 32 mEq/L Final   Glucose, Bld 12/04/2013 142* 70 - 99 mg/dL Final   BUN 12/04/2013 9  6 - 23 mg/dL Final   Creatinine, Ser 12/04/2013 1.28* 0.50 - 1.10 mg/dL Final   Calcium 12/04/2013 8.8  8.4 - 10.5 mg/dL Final   Total Protein 12/04/2013 5.6* 6.0 - 8.3 g/dL Final   Albumin 12/04/2013 2.5* 3.5 - 5.2 g/dL Final   AST 12/04/2013 16  0 - 37 U/L Final   ALT 12/04/2013 8  0 - 35 U/L Final   Alkaline Phosphatase 12/04/2013 126* 39 - 117 U/L Final   Total Bilirubin 12/04/2013 0.5  0.3 - 1.2 mg/dL Final   GFR calc non Af Amer 12/04/2013 46* >90 mL/min Final   GFR calc Af Amer 12/04/2013 53* >90 mL/min Final   Comment: (NOTE)                          The eGFR has been calculated using the CKD EPI equation.                          This calculation has not been validated in all clinical situations.                          eGFR's persistently <90 mL/min signify possible Chronic Kidney                          Disease.   WBC 12/05/2013 4.2  4.0 - 10.5 K/uL  Final   RBC 12/05/2013 3.23* 3.87 - 5.11 MIL/uL Final   Hemoglobin 12/05/2013 9.8* 12.0 - 15.0 g/dL Final   HCT 12/05/2013 30.7* 36.0 - 46.0 % Final   MCV 12/05/2013 95.0  78.0 - 100.0 fL Final   MCH 12/05/2013 30.3  26.0 - 34.0 pg Final   MCHC 12/05/2013 31.9  30.0 - 36.0 g/dL Final   RDW 12/05/2013 15.5  11.5 - 15.5 % Final   Platelets 12/05/2013 122* 150 - 400 K/uL Final   Sodium 12/05/2013 140  137 - 147 mEq/L Final   Potassium 12/05/2013 3.5* 3.7 - 5.3 mEq/L Final   Chloride 12/05/2013 101  96 - 112 mEq/L Final   CO2 12/05/2013 28  19 - 32 mEq/L Final   Glucose, Bld 12/05/2013 114* 70 - 99 mg/dL Final   BUN 12/05/2013 12  6 - 23 mg/dL Final  Creatinine, Ser 12/05/2013 1.25* 0.50 - 1.10 mg/dL Final   Calcium 12/05/2013 8.9  8.4 - 10.5 mg/dL Final   GFR calc non Af Amer 12/05/2013 47* >90 mL/min Final   GFR calc Af Amer 12/05/2013 55* >90 mL/min Final   Comment: (NOTE)                          The eGFR has been calculated using the CKD EPI equation.                          This calculation has not been validated in all clinical situations.                          eGFR's persistently <90 mL/min signify possible Chronic Kidney                          Disease.  Hospital Outpatient Visit on 11/30/2013  Component Date Value Ref Range Status   MRSA, PCR 11/30/2013 NEGATIVE  NEGATIVE Final   Staphylococcus aureus 11/30/2013 NEGATIVE  NEGATIVE Final   Comment:                                 The Xpert SA Assay (FDA                          approved for NASAL specimens                          in patients over 59 years of age),                          is one component of                          a comprehensive surveillance                          program.  Test performance has                          been validated by [employer] for patients greater                          than or equal to 35 year old.                          It  is not intended                          to diagnose infection nor to                          guide or monitor treatment.   aPTT 11/30/2013 31  24 - 37 seconds Final   FIO2 11/30/2013 0.21   Final   pH, Arterial 11/30/2013 7.338* 7.350 -  7.450 Final   pCO2 arterial 11/30/2013 53.8* 35.0 - 45.0 mmHg Final   pO2, Arterial 11/30/2013 75.0* 80.0 - 100.0 mmHg Final   Bicarbonate 11/30/2013 28.1* 20.0 - 24.0 mEq/L Final   TCO2 11/30/2013 29.8  0 - 100 mmol/L Final   Acid-Base Excess 11/30/2013 2.8* 0.0 - 2.0 mmol/L Final   O2 Saturation 11/30/2013 94.6   Final   Patient temperature 11/30/2013 98.6   Final   Collection site 11/30/2013 LEFT RADIAL   Final   Drawn by 11/30/2013 [id number]   Final   Sample type 11/30/2013 ARTERIAL DRAW   Final   Allens test (pass/fail) 11/30/2013 PASS  PASS Final   WBC 11/30/2013 4.7  4.0 - 10.5 K/uL Final   RBC 11/30/2013 3.91  3.87 - 5.11 MIL/uL Final   Hemoglobin 11/30/2013 12.3  12.0 - 15.0 g/dL Final   HCT 11/30/2013 36.9  36.0 - 46.0 % Final   MCV 11/30/2013 94.4  78.0 - 100.0 fL Final   MCH 11/30/2013 31.5  26.0 - 34.0 pg Final   MCHC 11/30/2013 33.3  30.0 - 36.0 g/dL Final   RDW 11/30/2013 15.4  11.5 - 15.5 % Final   Platelets 11/30/2013 159  150 - 400 K/uL Final   Sodium 11/30/2013 139  137 - 147 mEq/L Final   Potassium 11/30/2013 4.4  3.7 - 5.3 mEq/L Final   Chloride 11/30/2013 101  96 - 112 mEq/L Final   CO2 11/30/2013 25  19 - 32 mEq/L Final   Glucose, Bld 11/30/2013 107* 70 - 99 mg/dL Final   BUN 11/30/2013 13  6 - 23 mg/dL Final   Creatinine, Ser 11/30/2013 1.59* 0.50 - 1.10 mg/dL Final   Calcium 11/30/2013 9.2  8.4 - 10.5 mg/dL Final   Total Protein 11/30/2013 6.8  6.0 - 8.3 g/dL Final   Albumin 11/30/2013 3.5  3.5 - 5.2 g/dL Final   AST 11/30/2013 16  0 - 37 U/L Final   ALT 11/30/2013 10  0 - 35 U/L Final   Alkaline Phosphatase 11/30/2013 157* 39 - 117 U/L Final   Total Bilirubin 11/30/2013 0.4  0.3 - 1.2  mg/dL Final   GFR calc non Af Amer 11/30/2013 35* >90 mL/min Final   GFR calc Af Amer 11/30/2013 41* >90 mL/min Final   Comment: (NOTE)                          The eGFR has been calculated using the CKD EPI equation.                          This calculation has not been validated in all clinical situations.                          eGFR's persistently <90 mL/min signify possible Chronic Kidney                          Disease.   Prothrombin Time 11/30/2013 13.7  11.6 - 15.2 seconds Final   INR 11/30/2013 1.07  0.00 - 1.49 Final   ABO/RH(D) 11/30/2013 O POS   Final   Antibody Screen 11/30/2013 NEG   Final   Sample Expiration 11/30/2013 12/03/2013   Final   Color, Urine 11/30/2013 YELLOW  YELLOW Final   APPearance 11/30/2013 CLEAR  CLEAR Final   Specific Gravity, Urine 11/30/2013 1.012  1.005 -  1.030 Final   pH 11/30/2013 6.0  5.0 - 8.0 Final   Glucose, UA 11/30/2013 NEGATIVE  NEGATIVE mg/dL Final   Hgb urine dipstick 11/30/2013 NEGATIVE  NEGATIVE Final   Bilirubin Urine 11/30/2013 NEGATIVE  NEGATIVE Final   Ketones, ur 11/30/2013 NEGATIVE  NEGATIVE mg/dL Final   Protein, ur 11/30/2013 NEGATIVE  NEGATIVE mg/dL Final   Urobilinogen, UA 11/30/2013 1.0  0.0 - 1.0 mg/dL Final   Nitrite 11/30/2013 NEGATIVE  NEGATIVE Final   Leukocytes, UA 11/30/2013 NEGATIVE  NEGATIVE Final   MICROSCOPIC NOT DONE ON URINES WITH NEGATIVE PROTEIN, BLOOD, LEUKOCYTES, NITRITE, OR GLUCOSE <1000 mg/dL.    PATHOLOGY:  for LAURY, HUIZAR ([id number]) Patient: TELECIA, LAROCQUE Collected: 12/02/2013 Client: [facility] Accession: [id number] Received: 12/02/2013 [place] Charon DOB: 28-Aug-1955 Age: 8 Gender: F Reported: 12/04/2013 [address] Patient Ph: [phone] MRN #: [id number] [place], [place] [address] Visit #: [id number] Chart #: Phone:  Fax: CC: FLOW CYTOMETRY REPORT INTERPRETATION Interpretation Tissue-Flow Cytometry MONOCLONAL B CELL POPULATION  IDENTIFIED. Diagnosis Comment: The features are consistent with non-Hodgkin's B cell lymphoma. The phenotypic features are similar to previous analysis ([id number]). (BNS:kh 12-04-13) Susanne Greenhouse MD Pathologist, Electronic Signature (Case signed 12/04/2013) GROSS AND MICROSCOPIC INFORMATION Source Tissue-Flow Cytometry Microscopic Gated population: Flow cytometric immunophenotyping is performed using antibiodies to the antigens listed in the table below. Electronic gates are placed around a cell cluster displaying light scatter properties corresponding to lymphocytes. - Abnormal Cells in gated population: 84 % - Phenotype of Abnormal Cells: CD10, CD19, CD20, CDD21, CD22, CD23, HLA-Dr, Lambda 1 of 2 FINAL for CHASITEE, ZENKER [phone]) Specimen Table Lymphoid Associated Myeloid Associated Misc. As CD2 neg CD19 pos CD11c ND CD45 ND CD3 neg CD20 pos CD13 ND HLA-DR pos CD4 neg CD21 pos CD14 ND CD10 pos CD5 neg CD22 pos CD15 ND CD56/16 ND CD7 neg CD23 pos CD33 ND ZAP70 ND CD8 neg CD103 ND MPO ND CD34 ND CD25 ND FMC7 ND CD117 ND CD52 ND sKappa neg CD38 ND sLambda pos 7AAD tested cKappa ND cLambda ND Gross ref. [id number] All controls stained appropriately. The above tests were developed and their performance characteristics determined by the [facility]. They have not been cleared or approved by the U.S. Food and Drug Administration." Report signed from the following location(s) Interpretation performed at [facility], [place], [place] [address]. CLIA #: [id number],   FINAL for FLECIA, SHUTTER ([id number]) Patient: NIQUITA, DIGIOIA Collected: 12/02/2013 Client: [facility] Accession: [id number] Received: 12/02/2013 [place] Charon DOB: Nov 12, 1955 Age: 17 Gender: F Reported: 12/04/2013 [address] Patient Ph: [phone] MRN #: [id number] [place], [place] [address] Visit #: [id number] Chart #: Phone:   Fax: CC: REPORT OF SURGICAL PATHOLOGY FINAL DIAGNOSIS Diagnosis Lung, wedge biopsy/resection, Right lower lobe - LOW GRADE NON-HODGKIN'S B CELL LYMPHOMA - SEE ONCOLOGY TABLE. Microscopic Comment LYMPHOMA Histologic type: Non-Hodgkin's lymphoma, follicle center cell type Grade (if applicable): Low grade Flow cytometry: Monoclonal, lambda restricted B cell population expressing pan B cell antigens including CD20 with associated expression of CD10 ([id number]). Immunohistochemical stains: Not performed. Touch preps/imprints: Not performed. Comments: The sections show a dense nodular and diffuse lymphoproliferative process characterized by predominance of small round to angulated/irregular lymphoid cells with dense chromatin and small to inconspicuous nucleoli associated with scattered mitosis. This is admixed with a minor component of large lymphoid cells consisting of scattered cells. The lymphoid process appears to show a relatively prominent perivascular and subpleural distribution with  expansion into the surrounding lung parenchyma. No necrosis is seen. Flow cytometric analysis was performed and shows a monoclonal B cell population expressing pan B cell antigens including CD20 in association with CD10 expression. The overall features are consistent with involvement by low grade non-Hodgkin's B cell lymphoma, follicle center cell type. (BNS:kh 12-04-13) Susanne Greenhouse MD Pathologist, Electronic Signature (Case signed 12/04/2013) Intraoperative Diagnosis RIGHT LOWER LOBE WEDGE RESECTION, FROZEN SECTION DIAGNOSIS: LYMPHOID PROCESS. LYMPHOMA WORK-UP DONE (JBK). Specimen Gross and Clinical Information Specimen(s) Obtained: Lung, wedge biopsy/resection, Right lower lobe 1 of 2 FINAL for MAKYNLEIGH, BRESLIN [phone]) Specimen Clinical Information Right lower lobe (tl) Gross Received fresh for rapid intraoperative consult is a 4 gram, 3.8 x 1.7 x 1.2 cm wedge resection of lung. The pleura  is focally disrupted. This area is marked with black ink. The cut surface shows a 1 x 0.8 x 0.8 cm firm white nodule. The nodule extends to within 0.2 cm of the stapled margin. The cut surface at the stapled margin is marked with yellow ink. The remainder of the parenchyma is spongy red brown. A section is submitted for frozen section and a portion of the specimen is submitted in RPMI for flow cytometry. Touch preparations are made form cut surfaces of the nodule. Additional sections are submitted in two cassettes to include the entire nodule. (GP:gt, 12/02/13) Report signed out from the following location(s) Technical Component performed at [facility]. [address],[place] [address].BTDV:[id number],[id number]., Interpretation performed at [facility], [place], [place] [address]. CLIA #: [id number], Urinalysis    Component Value Date/Time   COLORURINE YELLOW 11/30/2013 1330   APPEARANCEUR CLEAR 11/30/2013 1330   LABSPEC 1.012 11/30/2013 1330   PHURINE 6.0 11/30/2013 1330   GLUCOSEU NEGATIVE 11/30/2013 1330   HGBUR NEGATIVE 11/30/2013 1330   BILIRUBINUR NEGATIVE 11/30/2013 1330   KETONESUR NEGATIVE 11/30/2013 1330   PROTEINUR NEGATIVE 11/30/2013 1330   UROBILINOGEN 1.0 11/30/2013 1330   NITRITE NEGATIVE 11/30/2013 1330   LEUKOCYTESUR NEGATIVE 11/30/2013 1330    RADIOGRAPHIC STUDIES: Dg Chest 2 View  12/15/2013   CLINICAL DATA:  Langerhans cell histiocytosis of long, coughing up phlegm and blood intermittent many since November 30, 2013  EXAM: CHEST  2 VIEW  COMPARISON:  12/05/2013  FINDINGS: Mild cardiac enlargement is stable. Mild scarring lateral lingula unchanged. Port-A-Cath in unchanged position.  3 cm masslike opacity over the right heart border/medial right lung base, stable from prior study.  IMPRESSION: No change from 12/05/2013 with masslike opacity medial right lung base.   Electronically Signed   By: Skipper Cliche M.D.   On: 12/15/2013 13:08    Dg Chest 2 View  12/05/2013   CLINICAL DATA:  Status post wedge resection. Right-sided chest pain.  EXAM: CHEST  2 VIEW  COMPARISON:  Chest x-ray 12/04/2013.  FINDINGS: In the medial aspect of the right lung base there is a mass like opacity projecting over the right heart border which is increasingly conspicuous over the prior several examinations, and may correspond to the previously noted hypermetabolic nodule in the right lower lobe on prior PET-CT 09/15/2013. Lungs otherwise appear clear. Specifically, no definite consolidative airspace disease or pleural effusions. No evidence of pulmonary edema. Post procedural changes of wedge resection in the lateral aspect of the left mid lung are again noted. Heart size and upper mediastinal contours are within normal limits. Atherosclerosis in the thoracic aorta. Left-sided single-lumen subclavian Port-A-Cath with tip terminating in the distal superior vena cava.  IMPRESSION:  1. No radiographic evidence of acute cardiopulmonary disease. 2. Increasingly conspicuous mass like opacity in the medial aspect of the right lower lobe, concerning for growing neoplasm, particularly given the hypermetabolic nodule noted in this region on prior PET-CT 09/15/2013. This could be better evaluated with followup chest CT (preferably with contrast) if clinically appropriate.   Electronically Signed   By: Vinnie Langton M.D.   On: 12/05/2013 15:52   Chest 2 View  11/30/2013   CLINICAL DATA:  Right lung nodule.  EXAM: CHEST  2 VIEW  COMPARISON:  PET scan 09/15/2013.  Chest CT 03/10/2012.  FINDINGS: The previously seen ground-glass nodule is not evident on this study. The heart size is normal. A left subclavian Port-A-Cath is stable. Focal scarring in the left lower lobe is again noted. A metallic marker is noted adjacent to the area, likely related to prior surgery. The lungs are otherwise clear. The visualized soft tissues and bony thorax are unremarkable.  IMPRESSION: 1. The  medial right lower lobe ground-glass nodule is not clearly visible on this study. 2. Previous left lower lobe scarring. 3. Left subclavian Port-A-Cath. 4. No acute cardiopulmonary disease.   Electronically Signed   By: Lawrence Santiago M.D.   On: 11/30/2013 14:07   Dg Chest Port 1 View  12/04/2013   CLINICAL DATA:  Status post chest tube removal.  EXAM: PORTABLE CHEST - 1 VIEW  COMPARISON:  12/04/2013 at 0623 hr  FINDINGS: Right-sided chest tube has been removed. Left subclavian central venous catheter remains with tip overlying the mid SVC. Cardiomediastinal silhouette is within normal limits. Small right apical pneumothorax is unchanged. There is mildly improved aeration of the right lung base compared to the prior study. Suture material and scarring are again seen in the left lower lung. No pleural effusion is identified. No acute osseous abnormality is seen.  IMPRESSION: Unchanged, small right apical pneumothorax following chest tube removal.   Electronically Signed   By: Logan Bores   On: 12/04/2013 09:21   Dg Chest Port 1 View  12/04/2013   CLINICAL DATA:  Status post lung resection  EXAM: PORTABLE CHEST - 1 VIEW  COMPARISON:  12/03/2013  FINDINGS: A left-sided chest wall port and right-sided chest tube are again seen and stable. A small pneumothorax is noted new from the prior exam. Very mild right basilar atelectasis is noted. The left lung demonstrates some chronic changes but is otherwise clear. No bony abnormality is noted.  IMPRESSION: New small right-sided pneumothorax. Mild right basilar atelectasis is noted.  Critical Value/emergent results were called by telephone at the time of interpretation on 12/04/2013 at 7:52 AM to [place], the patients nurse, who verbally acknowledged these results.   Electronically Signed   By: Inez Catalina M.D.   On: 12/04/2013 07:54   Dg Chest Port 1 View  12/03/2013   CLINICAL DATA:  Chest tube. Video-assisted thoracoscopy for wedge resection.  EXAM: PORTABLE CHEST - 1  VIEW  COMPARISON:  12/02/2013  FINDINGS: Left subclavian porta catheter and right IJ central line are in stable position. There is a right-sided chest tube in similar position, terminating near the apex. Stable mild cardiomegaly. Diffuse interstitial coarsening which appears stable from previous. Triangular density in the lower left lung which may reflect a bronchoscopy localization marker. No effusion or pneumothorax.  IMPRESSION: 1. Unchanged positioning of central lines and right chest tube. No pneumothorax. 2. Unchanged pulmonary venous congestion and postoperative atelectasis.   Electronically Signed   By: Gilford Silvius.D.  On: 12/03/2013 06:11   Dg Chest Port 1 View  12/02/2013   CLINICAL DATA:  Postoperative evaluation  EXAM: PORTABLE CHEST - 1 VIEW  COMPARISON:  Two-view chest 11/30/2013  FINDINGS: Low lung volumes. Cardiac silhouette is enlarged. Tip of the right-sided chest tube in the apex. There is no evidence of pneumothorax. A right-sided internal jugular catheter has been inserted tip of the levels superior vena cava. Left-sided central venous catheter stable. Mild prominence of the interstitial markings. No focal regions of consolidation. No acute osseous abnormalities. Stable postsurgical changes with lateral left lower hemithorax.  IMPRESSION: Right-sided chest tube tip in the right apex.  No pneumothorax.  Remaining support apparatus unremarkable.  Pulmonary vascular congestion   Electronically Signed   By: Margaree Mackintosh M.D.   On: 12/02/2013 11:32    ASSESSMENT:  #1. Recurrent well-differentiated lymphocytic lymphoma involving the left axilla and right upper lobe, status post wedge resection of right upper lobe lesion. #2. 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 #3. 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 Chronic obstructive pulmonary disease, continuing to smoke. #5. Peripheral vascular disease, status post bilateral lower extremity stenting, carotid endarterectomy, and history of previous right cerebral thrombosis with minimal to no neurologic deficit. #6. MRSA colonization.   PLAN:  #1. A discussion was held with the patient, unfortunately in the absence of her brother. Alternatives for management now would include either watchful expectation, Rituxan IV weekly for 4 weeks followed by maintenance therapy every 3 months for 2 years, or treatment with idelalisib starting at 100 mg twice a day indefinitely either until progressive disease or toxicity. #2. Patient will meet with nurse navigator in the presence of her brother to discuss these options and will be seen again in 2 weeks when her final decision is made.   All questions were answered. The patient knows to call the clinic with any problems, questions or concerns. We can certainly see the patient much sooner if necessary.   I spent 25 minutes counseling the patient face to face. The total time spent in the appointment was 30 minutes.    Doroteo Bradford, MD 12/22/2013 11:25 AM  DISCLAIMER:  This note was dictated with voice recognition software.  Similar sounding words can inadvertently be transcribed inaccurately and may not be corrected upon review.

## 2013-12-22 NOTE — Patient Instructions (Signed)
[facility]   CHEMOTHERAPY INSTRUCTIONS   POTENTIAL SIDE EFFECTS OF TREATMENT: Increased Susceptibility to Infection, Vomiting, Constipation, Red or Pink Urine (with Adriamycin), Changes in Character of Skin and Nails (brittleness, dryness,etc.), Bone Marrow Suppression, Complete Hair Loss, Nausea, Diarrhea, Sun Sensitivity and Mouth Sores   EDUCATIONAL MATERIALS GIVEN AND REVIEWED: Chemotherapy and You booklet Specific Instructions Sheets: Rituxan, [name]  SELF CARE ACTIVITIES WHILE ON CHEMOTHERAPY: Increase your fluid intake 48 hours prior to treatment and drink at least 2 quarts per day after treatment., No alcohol intake., No aspirin or other medications unless approved by your oncologist., Eat foods that are light and easy to digest., Eat foods at cold or room temperature., No fried, fatty, or spicy foods immediately before or after treatment., Have teeth cleaned professionally before starting treatment. Keep dentures and partial plates clean., Use soft toothbrush and do not use mouthwashes that contain alcohol. Biotene is a good mouthwash. Use warm salt water gargles (1 teaspoon salt per 1 quart warm water) before and after meals and at bedtime. Or you may rinse with 2 tablespoons of three -percent hydrogen peroxide mixed in eight ounces of water., Always use sunscreen with SPF (Sun Protection Factor) of 30 or higher., Use your nausea medication as directed to prevent nausea., Use your stool softener or laxative as directed to prevent constipation. and Use your anti-diarrheal medication as directed to stop diarrhea.  Please wash your hands for at least 30 seconds using warm soapy water. Handwashing is the #1 way to prevent the spread of germs. Stay away from sick people or people who are getting over a cold. If you develop respiratory systems such as green/yellow mucus production or productive cough or persistent cough let us know and we will see if you need  an antibiotic. It is a good idea to keep a pair of gloves on when going into grocery stores/[employer] to decrease your risk of coming into contact with germs on the carts, etc. Carry alcohol hand gel with you at all times and use it frequently if out in public. All foods need to be cooked thoroughly. No raw foods. No medium or undercooked meats, eggs. If your food is cooked medium well, it does not need to be hot pink or saturated with bloody liquid at all. Vegetables and fruits need to be washed/rinsed under the faucet with a dish detergent before being consumed. You can eat raw fruits and vegetables unless we tell you otherwise but it would be best if you cooked them or bought frozen. Do not eat off of salad bars or hot bars unless you really trust the cleanliness of the restaurant. If you need dental work, please let us know before you go for your appointment so that we can coordinate the best possible time for you in regards to your chemo regimen. You need to also let your dentist know that you are actively taking chemo. We may need to do labs prior to your dental appointment. We also want your bowels moving at least every other day. If this is not happening, we need to know so that we can get you on a bowel regimen to help you go.       MEDICATIONS: You have been given prescriptions for the following medications:  Over-the-Counter Meds:  Colace - this is a stool softener. Take 100mg  capsule 2-6 times a day as needed. If you have to take more than 6 capsules of Colace a day call the [name].  Senna - this is a mild laxative used to treat mild constipation. May take 2 tabs by mouth daily or up to twice a day as needed for mild constipation.  Milk of Magnesia - this is a laxative used to treat moderate to severe constipation. May take 2-4 tablespoons every 8 hours as needed. May increase to 8 tablespoons x 1 dose and if no bowel movement call the [name].  Imodium - this is for diarrhea.  Take 2 tabs after 1st loose stool and then 1 tab after each loose stool until you go a total of 12 hours without a loose stool. Call [name] if loose stools continue.   SYMPTOMS TO REPORT AS SOON AS POSSIBLE AFTER TREATMENT:  FEVER GREATER THAN 100.5 F  CHILLS WITH OR WITHOUT FEVER  NAUSEA AND VOMITING THAT IS NOT CONTROLLED WITH YOUR NAUSEA MEDICATION  UNUSUAL SHORTNESS OF BREATH  UNUSUAL BRUISING OR BLEEDING  TENDERNESS IN MOUTH AND THROAT WITH OR WITHOUT PRESENCE OF ULCERS  URINARY PROBLEMS  BOWEL PROBLEMS  UNUSUAL RASH    Wear comfortable clothing and clothing appropriate for easy access to any Portacath or PICC line. Let us know if there is anything that we can do to make your therapy better!      I have been informed and understand all of the instructions given to me and have received a copy. I have been instructed to call the clinic [phone] or my family physician as soon as possible for continued medical care, if indicated. I do not have any more questions at this time but understand that I may call the [name] or the Patient Navigator at [phone] during office hours should I have questions or need assistance in obtaining follow-up care.      _________________________________________      _______________     __________ Signature of Patient or Authorized Representative        Date                            Time      _________________________________________ Nurse's Signature

## 2013-12-22 NOTE — Patient Instructions (Signed)
[facility] Discharge Instructions  RECOMMENDATIONS MADE BY THE CONSULTANT AND ANY TEST RESULTS WILL BE SENT TO YOUR REFERRING PHYSICIAN.  EXAM FINDINGS BY THE PHYSICIAN TODAY AND SIGNS OR SYMPTOMS TO REPORT TO CLINIC OR PRIMARY PHYSICIAN: Exam and findings as discussed by Dr. Barnet Glasgow.  You have lymphoma in your chest and axilla.  There are 3 options for you      1.  Nothing but watch and see and PET scan follow-up      2.  Rituxan intravenously      3.  Zydelig orally for life.   MEDICATIONS PRESCRIBED:  none  INSTRUCTIONS/FOLLOW-UP: You and your brother to meet with Lupita Raider our nurse navigator on Friday 6/26 at 10 am to discuss treatment options. To see Dr. Barnet Glasgow for final treatment recommedation in 2 weeks.  Thank you for choosing [facility] to provide your oncology and hematology care.  To afford each patient quality time with our providers, please arrive at least 15 minutes before your scheduled appointment time.  With your help, our goal is to use those 15 minutes to complete the necessary work-up to ensure our physicians have the information they need to help with your evaluation and healthcare recommendations.    Effective January 1st, 2014, we ask that you re-schedule your appointment with our physicians should you arrive 10 or more minutes late for your appointment.  We strive to give you quality time with our providers, and arriving late affects you and other patients whose appointments are after yours.    Again, thank you for choosing [facility].  Our hope is that these requests will decrease the amount of time that you wait before being seen by our physicians.       _____________________________________________________________  Should you have questions after your visit to [facility], please contact our office at [phone] between the hours of 8:30 a.m. and 4:30 p.m.  Voicemails left after 4:30 p.m. will  not be returned until the following business day.  For prescription refill requests, have your pharmacy contact our office with your prescription refill request.    _______________________________________________________________  We hope that we have given you very good care.  You may receive a patient satisfaction survey in the mail, please complete it and return it as soon as possible.  We value your feedback!

## 2013-12-23 NOTE — Patient Instructions (Addendum)
[name]   CHEMOTHERAPY INSTRUCTIONS  Rituxan - You will need to take Tylenol 650mg  and Benadryl 50mg  1 hour before the Rituxan. You can take this at home. This reduces your risk of having an allergic reaction to the Rituxan. You will do this each time prior to Rituxan.   Side Effects: during infusion - itching, low blood pressure, low oxygen, bronchospasm, rash, trouble breathing - we need to know immediately if any of this happens. The first time you receive this drug, it takes a long time to infuse because we titrate the drug very slowly. With each Rituxan infusion, the likelihood of developing an infusion reaction decreases. You may also experience fever, chills, shaking chills, headaches, muscle aches, nausea, rash, and a low white blood cell count. We need to be sure that you are drinking plenty of fluids - preferably 64oz of decaff fluids/water daily. It is best to start drinking fluids 2 days prior to treatment and for up to 4-5 days after treatment. As your tumor breaks down, it leaves behind uric acid and the extra fluid that you drink helps to flush this out of your body.   Administration: Rituxan IV weekly for 4 weeks followed by maintenance therapy every 3 months for 2 years.   Zydelig -   Administration: This medication will be prescribed for you to take two times a day. You may take this medication with our without food. Swallow this tablet whole. Do not crush, chew, or split the tablet. Do not miss a dose of Zydelig. If you miss a dose of Zydelig by less than 6 hours, take the missed dose right away. Then take your next dose as usual. If you miss a dose of Zydelig by more than 6 hours, wait and take the next dose of Zydelig at your usual time.   Side Effects: Severe Skin Reactions such as painful sores or ulcers on your skin, lips, or in your mouth/severe rash with blisters or peeling skin (TELL us RIGHT AWAY!) Anaphylaxis (throat swelling/can't  breathe) - call 911! Low white blood cell count (neutropenia).  We will be checking your blood counts regularly during treatment with Zydelig. We need to know if you develop a fever greater than 100.5, chills, green/yellow mucus production, congestion, etc.   *The most common side effects include: fever, feeling tired, nausea, cough, abdominal pain, and chills.*   Serious Side Effects - fatal and/or serious liver toxicity, fatal and/or serious and severe diarrhea and inflammation of the colon, fatal and serious pneumonitis, fatal and serous intestinal perforation (tear in intestinal lining)   Please carry your Zydelig Patient Safety Information Card with you in your wallet and present it to a healthcare worker if need be.   We will be checking your liver function and complete blood count every 2 weeks x 3 months.    POTENTIAL SIDE EFFECTS OF TREATMENT: Increased Susceptibility to Infection/Bone Marrow Suppression, Nausea/Vomiting, Constipation/Diarrhea, Hair Thinning, Changes in Character of Skin and Nails (brittleness, dryness,etc.), Pigment Changes (palms of hands, soles of feet, etc.), Sun Sensitivity and Mouth Sores   EDUCATIONAL MATERIALS GIVEN AND REVIEWED: Specific Instructions Sheets: Rituxan and Zydelig   SELF CARE ACTIVITIES WHILE ON CHEMOTHERAPY: Increase your fluid intake 48 hours prior to treatment and drink at least 2 quarts per day after treatment., No alcohol intake., No aspirin or other medications unless approved by your oncologist., Eat foods that are light and easy to digest., Eat foods at cold or room temperature., No  fried, fatty, or spicy foods immediately before or after treatment., Have teeth cleaned professionally before starting treatment. Keep dentures and partial plates clean., Use soft toothbrush and do not use mouthwashes that contain alcohol. Biotene is a good mouthwash that is available at most pharmacies or may be ordered by calling [phone]., Use warm  salt water gargles (1 teaspoon salt per 1 quart warm water) before and after meals and at bedtime. Or you may rinse with 2 tablespoons of three -percent hydrogen peroxide mixed in eight ounces of water., Always use sunscreen with SPF (Sun Protection Factor) of 30 or higher., Use your nausea medication as directed to prevent nausea., Use your stool softener or laxative as directed to prevent constipation. and Use your anti-diarrheal medication as directed to stop diarrhea.  Please wash your hands for at least 30 seconds using warm soapy water. Handwashing is the #1 way to prevent the spread of germs. Stay away from sick people or people who are getting over a cold. If you develop respiratory systems such as green/yellow mucus production or productive cough or persistent cough let us know and we will see if you need an antibiotic. It is a good idea to keep a pair of gloves on when going into grocery stores/[employer] to decrease your risk of coming into contact with germs on the carts, etc. Carry alcohol hand gel with you at all times and use it frequently if out in public. All foods need to be cooked thoroughly. No raw foods. No medium or undercooked meats, eggs. If your food is cooked medium well, it does not need to be hot pink or saturated with bloody liquid at all. Vegetables and fruits need to be washed/rinsed under the faucet with a dish detergent before being consumed. You can eat raw fruits and vegetables unless we tell you otherwise but it would be best if you cooked them or bought frozen. Do not eat off of salad bars or hot bars unless you really trust the cleanliness of the restaurant. If you need dental work, please let us know before you go for your appointment so that we can coordinate the best possible time for you in regards to your chemo regimen. You need to also let your dentist know that you are actively taking chemo. We may need to do labs prior to your dental appointment. We also want your bowels  moving at least every other day. If this is not happening, we need to know so that we can get you on a bowel regimen to help you go.      MEDICATIONS: You have been given prescriptions for the following medications:  Over-the-Counter Meds:  Colace - this is a stool softener. Take 100mg  capsule 2-6 times a day as needed. If you have to take more than 6 capsules of Colace a day call the [facility].  Senna - this is a mild laxative used to treat mild constipation. May take 2 tabs by mouth daily or up to twice a day as needed for mild constipation.  Milk of Magnesia - this is a laxative used to treat moderate to severe constipation. May take 2-4 tablespoons every 8 hours as needed. May increase to 8 tablespoons x 1 dose and if no bowel movement call the [facility].  Imodium - this is for diarrhea. Take 2 tabs after 1st loose stool and then 1 tab after each loose stool until you go a total of 12 hours without a loose stool. Call [facility]  if loose stools continue.   SYMPTOMS TO REPORT AS SOON AS POSSIBLE AFTER TREATMENT:  FEVER GREATER THAN 100.5 F  CHILLS WITH OR WITHOUT FEVER  NAUSEA AND VOMITING THAT IS NOT CONTROLLED WITH YOUR NAUSEA MEDICATION  UNUSUAL SHORTNESS OF BREATH  UNUSUAL BRUISING OR BLEEDING  TENDERNESS IN MOUTH AND THROAT WITH OR WITHOUT PRESENCE OF ULCERS  URINARY PROBLEMS  BOWEL PROBLEMS  UNUSUAL RASH    Wear comfortable clothing and clothing appropriate for easy access to any Portacath or PICC line. Let us know if there is anything that we can do to make your therapy better!      I have been informed and understand all of the instructions given to me and have received a copy. I have been instructed to call the clinic [phone] or my family physician as soon as possible for continued medical care, if indicated. I do not have any more questions at this time but understand that I may call the [facility] or the Patient Navigator at [phone] during office hours should I have questions or need assistance in obtaining follow-up care.      _________________________________________      _______________     __________ Signature of Patient or Authorized Representative        Date                            Time      _________________________________________ Nurse's Signature      Rituximab injection What is this medicine? RITUXIMAB (ri TUX i mab) is a monoclonal antibody. This medicine changes the way the body's immune system works. It is used commonly to treat non-Hodgkin's lymphoma and other conditions. In cancer cells, this drug targets a specific protein within cancer cells and stops the cancer cells from growing. It is also used to treat rhuematoid arthritis (RA). In RA, this medicine slow the inflammatory process and help reduce joint pain and swelling. This medicine is often used with other cancer or arthritis medications. This medicine may be used for other purposes; ask your health care provider or pharmacist if you have questions. COMMON BRAND NAME(S): Rituxan What should I tell my health care provider before I take this medicine? They need to know if you have any of these conditions: -blood disorders -heart disease -history of hepatitis B -infection (especially a virus infection such as chickenpox, cold sores, or herpes) -irregular heartbeat -kidney disease -lung or breathing disease, like asthma -lupus -an unusual or allergic reaction to rituximab, mouse proteins, other medicines, foods, dyes, or preservatives -pregnant or trying to get pregnant -breast-feeding How should I use this medicine? This medicine is for infusion into a vein. It is administered in a hospital or clinic by a specially trained health care professional. A special MedGuide will be given to you by the pharmacist with each prescription and refill. Be sure to read this information carefully each time. Talk to your pediatrician  regarding the use of this medicine in children. This medicine is not approved for use in children. Overdosage: If you think you have taken too much of this medicine contact a poison control center or emergency room at once. NOTE: This medicine is only for you. Do not share this medicine with others. What if I miss a dose? It is important not to miss a dose. Call your doctor or health care professional if you are unable to keep an appointment. What may interact with this  medicine? -cisplatin -medicines for blood pressure -some other medicines for arthritis -vaccines This list may not describe all possible interactions. Give your health care provider a list of all the medicines, herbs, non-prescription drugs, or dietary supplements you use. Also tell them if you smoke, drink alcohol, or use illegal drugs. Some items may interact with your medicine. What should I watch for while using this medicine? Report any side effects that you notice during your treatment right away, such as changes in your breathing, fever, chills, dizziness or lightheadedness. These effects are more common with the first dose. Visit your prescriber or health care professional for checks on your progress. You will need to have regular blood work. Report any other side effects. The side effects of this medicine can continue after you finish your treatment. Continue your course of treatment even though you feel ill unless your doctor tells you to stop. Call your doctor or health care professional for advice if you get a fever, chills or sore throat, or other symptoms of a cold or flu. Do not treat yourself. This drug decreases your body's ability to fight infections. Try to avoid being around people who are sick. This medicine may increase your risk to bruise or bleed. Call your doctor or health care professional if you notice any unusual bleeding. Be careful brushing and flossing your teeth or using a toothpick because you may get  an infection or bleed more easily. If you have any dental work done, tell your dentist you are receiving this medicine. Avoid taking products that contain aspirin, acetaminophen, ibuprofen, naproxen, or ketoprofen unless instructed by your doctor. These medicines may hide a fever. Do not become pregnant while taking this medicine. Women should inform their doctor if they wish to become pregnant or think they might be pregnant. There is a potential for serious side effects to an unborn child. Talk to your health care professional or pharmacist for more information. Do not breast-feed an infant while taking this medicine. What side effects may I notice from receiving this medicine? Side effects that you should report to your doctor or health care professional as soon as possible: -allergic reactions like skin rash, itching or hives, swelling of the face, lips, or tongue -low blood counts - this medicine may decrease the number of white blood cells, red blood cells and platelets. You may be at increased risk for infections and bleeding. -signs of infection - fever or chills, cough, sore throat, pain or difficulty passing urine -signs of decreased platelets or bleeding - bruising, pinpoint red spots on the skin, black, tarry stools, blood in the urine -signs of decreased red blood cells - unusually weak or tired, fainting spells, lightheadedness -breathing problems -confused, not responsive -chest pain -fast, irregular heartbeat -feeling faint or lightheaded, falls -mouth sores -redness, blistering, peeling or loosening of the skin, including inside the mouth -stomach pain -swelling of the ankles, feet, or hands -trouble passing urine or change in the amount of urine Side effects that usually do not require medical attention (report to your doctor or other health care professional if they continue or are bothersome): -anxiety -headache -loss of appetite -muscle aches -nausea -night sweats This  list may not describe all possible side effects. Call your doctor for medical advice about side effects. You may report side effects to FDA at 1-800-FDA-1088. Where should I keep my medicine? This drug is given in a hospital or clinic and will not be stored at home. NOTE: This sheet is a summary.  It may not cover all possible information. If you have questions about this medicine, talk to your doctor, pharmacist, or health care provider.  2015, [employer]vier/Gold Standard. (2008-02-16 14:04:59) Idelalisib tablets What is this medicine? IDELALISIB (eye del AL isib) is a chemotherapy drug. It targets proteins in cancer cells and stops the cancer cells from growing. This medicine may be used for other purposes; ask your health care provider or pharmacist if you have questions. COMMON BRAND NAME(S): ZYDELIG What should I tell my health care provider before I take this medicine? They need to know if you have any of these conditions: -infection -inflammatory bowel disease -liver disease -low blood count, like low white cell, platelet, or red cell counts -lung or breathing disease, like asthma -stomach or intestine problems -an unusual or allergic reaction to idelalisib, other medicines, foods, dyes, or preservative -pregnant, or trying to get pregnant -breast-feeding How should I use this medicine? Take this medicine by mouth with a glass of water. Follow the directions on the prescription label. Do not cut, crush or chew this medicine. Take your medicine at regular intervals. Do not take it more often than directed. Do not stop taking except on your doctor's advice. Talk to your pediatrician regarding the use of this medicine in children. Special care may be needed. Overdosage: If you think you've taken too much of this medicine contact a poison control center or emergency room at once. Overdosage: If you think you have taken too much of this medicine contact a poison control center or emergency room  at once. NOTE: This medicine is only for you. Do not share this medicine with others. What if I miss a dose? If you miss a dose, take it as soon as you can. If it is almost time for your next dose, take only that dose. Do not take double or extra doses. What may interact with this medicine? Do not take this medicine with any of the following medications: -atorvastatin -carbamazepine -cerivastatin -cisapride -dasatinib -dihydroergotamine -doxorubicin -dronedarone -eletriptan -enzalutamide -ergotamine -fosphenytoin -halofantrine -ketoconazole -lovastatin -midazolam -nefazodone -nelfinavir -nifedipine -phenobarbital -phenytoin -pimozide -primidone -propafenone -quetiapine -quinidine -ranolazine -rifabutin -rifampin -rilpivirine -risperidone -ritonavir -sildosin -simvastatin -sirolimus -St. John's Wort, Hypericum perforatum -vinblastine -ziprasidone This medicine may also interact with the following medications: -certain medicines for blood pressure, heart disease, irregular heart beat -certain medicines for blood pressure like amlodipine, felodipine, nifedipine -certain medicines for cholesterol like atorvastatin, lovastatin, and simvastatin -certain medicines for depression, anxiety, or psychotic disturbances -certain medicines for erectile dysfunction -certain medicines that treat or prevent blood clots like warfarin, enoxaparin, dalteparin, apixaban, dabigatran, and rivaroxaban -ergot alkaloids like dihydroergotamine, ergotamine -narcotic medicines for pain This list may not describe all possible interactions. Give your health care provider a list of all the medicines, herbs, non-prescription drugs, or dietary supplements you use. Also tell them if you smoke, drink alcohol, or use illegal drugs. Some items may interact with your medicine. What should I watch for while using this medicine? This drug may make you feel generally unwell. This is not uncommon, as  chemotherapy can affect healthy cells as well as cancer cells. Report any side effects. Continue your course of treatment even though you feel ill unless your doctor tells you to stop. Men and women should use effective birth control while taking this medicine. Do not become pregnant while taking this medicine. Women should inform their doctor if they wish to become pregnant or think they might be pregnant. There is a potential for serious side effects to an  unborn child. Talk to your health care professional or pharmacist for more information. Do not breast-feed an infant while taking this medicine. This medicine may increase your risk to bruise or bleed. Call your doctor or health care professional if you notice any unusual bleeding. If you are going to have surgery or any other procedures, tell your doctor you are taking this medicine. Call your doctor or health care professional for advice if you get a fever, chills or sore throat, or other symptoms of a cold or flu. Do not treat yourself. This drug decreases your body's ability to fight infections. Try to avoid being around people who are sick. You may need blood work done while you are taking this medicine. What side effects may I notice from receiving this medicine? Side effects that you should report to your doctor or health care professional as soon as possible: -allergic reactions like skin rash, itching or hives, swelling of the face, lips, or tongue -breathing problems -diarrhea -low blood counts - this medicine may decrease the number of white blood cells, red blood cells and platelets. You may be at increased risk for infections and bleeding -nausea, vomiting -redness, blistering, peeling or loosening of the skin, including inside the mouth -signs and symptoms of bleeding such as bloody or black, tarry stools; red or dark-brown urine; spitting up blood or brown material that looks like coffee grounds; red spots on the skin; unusual  bruising or bleeding from the eye, gums, or nose -signs of infection - fever or chills, cough, sore throat, pain or difficulty passing urine -signs and symptoms of liver injury like dark yellow or brown urine; general ill feeling or flu-like symptoms; light-colored stools; loss of appetite; nausea; right upper belly pain; unusually weak or tired; yellowing of the eyes or skin -stomach pain Side effects that usually do not require medical attention (Report these to your doctor or health care professional if they continue or are bothersome.): -cough -headache -signs and symptoms of high blood sugar such as dizziness; dry mouth; dry skin; fruity breath; nausea; stomach pain; increased hunger or thirst; increased urination -signs and symptoms of low blood sugar such as feeling anxious; confusion; dizziness; increased hunger; unusually weak or tired; sweating; shakiness; cold; irritable; headache; blurred vision; fast heartbeat; loss of consciousness -weak or tired This list may not describe all possible side effects. Call your doctor for medical advice about side effects. You may report side effects to FDA at 1-800-FDA-1088. Where should I keep my medicine? Keep out of the reach of children. Store between 20 and 30 degrees C (68 and 86 degrees F). Keep this medicine in the original container. Throw away any unused medicine after the expiration date. NOTE: This sheet is a summary. It may not cover all possible information. If you have questions about this medicine, talk to your doctor, pharmacist, or health care provider.  2015, [employer] Standard. (2013-02-25 09:07:18)

## 2013-12-24 ENCOUNTER — Other Ambulatory Visit (HOSPITAL_COMMUNITY): Payer: Self-pay | Admitting: Hematology and Oncology

## 2013-12-24 ENCOUNTER — Encounter (HOSPITAL_COMMUNITY): Payer: Medicare Other

## 2013-12-24 DIAGNOSIS — C8590 Non-Hodgkin lymphoma, unspecified, unspecified site: Secondary | ICD-10-CM

## 2013-12-24 DIAGNOSIS — C8209 Follicular lymphoma grade I, extranodal and solid organ sites: Secondary | ICD-10-CM | POA: Insufficient documentation

## 2013-12-24 DIAGNOSIS — C8300 Small cell B-cell lymphoma, unspecified site: Secondary | ICD-10-CM

## 2013-12-24 DIAGNOSIS — Z87898 Personal history of other specified conditions: Secondary | ICD-10-CM

## 2013-12-24 DIAGNOSIS — R918 Other nonspecific abnormal finding of lung field: Secondary | ICD-10-CM

## 2013-12-24 HISTORY — DX: Non-Hodgkin lymphoma, unspecified, unspecified site: C85.90

## 2013-12-24 HISTORY — DX: Small cell B-cell lymphoma, unspecified site: C83.00

## 2013-12-24 LAB — CBC WITH DIFFERENTIAL/PLATELET
Basophils Absolute: 0 10*3/uL (ref 0.0–0.1)
Basophils Relative: 0 % (ref 0–1)
Eosinophils Absolute: 0.2 10*3/uL (ref 0.0–0.7)
Eosinophils Relative: 3 % (ref 0–5)
HCT: 36.5 % (ref 36.0–46.0)
HEMOGLOBIN: 12.2 g/dL (ref 12.0–15.0)
LYMPHS ABS: 1.6 10*3/uL (ref 0.7–4.0)
LYMPHS PCT: 23 % (ref 12–46)
MCH: 30.7 pg (ref 26.0–34.0)
MCHC: 33.4 g/dL (ref 30.0–36.0)
MCV: 91.7 fL (ref 78.0–100.0)
MONOS PCT: 12 % (ref 3–12)
Monocytes Absolute: 0.8 10*3/uL (ref 0.1–1.0)
NEUTROS ABS: 4.3 10*3/uL (ref 1.7–7.7)
NEUTROS PCT: 62 % (ref 43–77)
Platelets: 139 10*3/uL — ABNORMAL LOW (ref 150–400)
RBC: 3.98 MIL/uL (ref 3.87–5.11)
RDW: 14.8 % (ref 11.5–15.5)
WBC: 6.9 10*3/uL (ref 4.0–10.5)

## 2013-12-24 LAB — COMPREHENSIVE METABOLIC PANEL
ALT: 27 U/L (ref 0–35)
AST: 19 U/L (ref 0–37)
Albumin: 3.3 g/dL — ABNORMAL LOW (ref 3.5–5.2)
Alkaline Phosphatase: 189 U/L — ABNORMAL HIGH (ref 39–117)
BUN: 17 mg/dL (ref 6–23)
CALCIUM: 9.3 mg/dL (ref 8.4–10.5)
CO2: 28 mEq/L (ref 19–32)
Chloride: 92 mEq/L — ABNORMAL LOW (ref 96–112)
Creatinine, Ser: 1.56 mg/dL — ABNORMAL HIGH (ref 0.50–1.10)
GFR calc non Af Amer: 36 mL/min — ABNORMAL LOW (ref >90)
GFR, EST AFRICAN AMERICAN: 42 mL/min — AB (ref >90)
GLUCOSE: 174 mg/dL — AB (ref 70–99)
Potassium: 4.5 mEq/L (ref 3.7–5.3)
Sodium: 134 mEq/L — ABNORMAL LOW (ref 137–147)
Total Bilirubin: 0.4 mg/dL (ref 0.3–1.2)
Total Protein: 6.9 g/dL (ref 6.0–8.3)

## 2013-12-24 LAB — RETICULOCYTES
RBC.: 3.98 MIL/uL (ref 3.87–5.11)
RETIC CT PCT: 1.7 % (ref 0.4–3.1)
Retic Count, Absolute: 67.7 10*3/uL (ref 19.0–186.0)

## 2013-12-24 LAB — LACTATE DEHYDROGENASE: LDH: 183 U/L (ref 94–250)

## 2013-12-24 NOTE — Progress Notes (Unsigned)
Teaching done for [facility]. Patient prefers to take Rituxan. Calendar to be made for patient and given to patient.

## 2013-12-24 NOTE — Progress Notes (Unsigned)
LABS DRAWN FOR HEP B AB, HEP B SAG, CBCD, CMP, LDH, B2M, RETIC.

## 2013-12-25 ENCOUNTER — Ambulatory Visit (HOSPITAL_COMMUNITY): Payer: Medicare Other

## 2013-12-25 LAB — HEPATITIS B SURFACE ANTIGEN: HEP B S AG: NEGATIVE

## 2013-12-25 LAB — HEPATITIS B CORE ANTIBODY, IGM: Hep B C IgM: NONREACTIVE

## 2013-12-28 LAB — BETA 2 MICROGLOBULIN, SERUM: Beta-2 Microglobulin: 5.31 mg/L — ABNORMAL HIGH (ref <=2.51)

## 2013-12-31 ENCOUNTER — Encounter (HOSPITAL_BASED_OUTPATIENT_CLINIC_OR_DEPARTMENT_OTHER): Payer: Medicare Other | Admitting: Oncology

## 2013-12-31 ENCOUNTER — Encounter (HOSPITAL_COMMUNITY): Payer: Self-pay | Admitting: Oncology

## 2013-12-31 ENCOUNTER — Encounter (HOSPITAL_COMMUNITY): Payer: Medicare Other | Attending: Hematology and Oncology

## 2013-12-31 VITALS — BP 135/58 | HR 80 | Temp 98.2°F | Resp 20 | Wt 167.8 lb

## 2013-12-31 VITALS — BP 109/58 | HR 68 | Temp 97.9°F | Resp 18

## 2013-12-31 DIAGNOSIS — Z5112 Encounter for antineoplastic immunotherapy: Secondary | ICD-10-CM

## 2013-12-31 DIAGNOSIS — C8588 Other specified types of non-Hodgkin lymphoma, lymph nodes of multiple sites: Secondary | ICD-10-CM

## 2013-12-31 DIAGNOSIS — C8590 Non-Hodgkin lymphoma, unspecified, unspecified site: Secondary | ICD-10-CM

## 2013-12-31 DIAGNOSIS — C8300 Small cell B-cell lymphoma, unspecified site: Secondary | ICD-10-CM

## 2013-12-31 DIAGNOSIS — C8599 Non-Hodgkin lymphoma, unspecified, extranodal and solid organ sites: Secondary | ICD-10-CM | POA: Insufficient documentation

## 2013-12-31 DIAGNOSIS — J449 Chronic obstructive pulmonary disease, unspecified: Secondary | ICD-10-CM

## 2013-12-31 DIAGNOSIS — C8589 Other specified types of non-Hodgkin lymphoma, extranodal and solid organ sites: Secondary | ICD-10-CM | POA: Insufficient documentation

## 2013-12-31 MED ORDER — ACETAMINOPHEN 325 MG PO TABS
650.0000 mg | ORAL_TABLET | Freq: Once | ORAL | Status: AC
  Administered 2013-12-31: 650 mg via ORAL
  Filled 2013-12-31: qty 2

## 2013-12-31 MED ORDER — SODIUM CHLORIDE 0.9 % IV SOLN
Freq: Once | INTRAVENOUS | Status: AC
  Administered 2013-12-31: 10:00:00 via INTRAVENOUS

## 2013-12-31 MED ORDER — DIPHENHYDRAMINE HCL 25 MG PO CAPS
50.0000 mg | ORAL_CAPSULE | Freq: Once | ORAL | Status: AC
  Administered 2013-12-31: 50 mg via ORAL
  Filled 2013-12-31: qty 2

## 2013-12-31 MED ORDER — HEPARIN SOD (PORK) LOCK FLUSH 100 UNIT/ML IV SOLN
500.0000 [IU] | Freq: Once | INTRAVENOUS | Status: AC
  Administered 2013-12-31: 500 [IU] via INTRAVENOUS

## 2013-12-31 MED ORDER — SODIUM CHLORIDE 0.9 % IV SOLN: 375.0000 mg/m2 | Freq: Once | INTRAVENOUS | Status: DC

## 2013-12-31 MED ORDER — SODIUM CHLORIDE 0.9 % IV SOLN
375.0000 mg/m2 | Freq: Once | INTRAVENOUS | Status: AC
  Administered 2013-12-31: 700 mg via INTRAVENOUS
  Filled 2013-12-31: qty 50

## 2013-12-31 MED ORDER — SODIUM CHLORIDE 0.9 % IJ SOLN
10.0000 mL | INTRAMUSCULAR | Status: DC | PRN
  Administered 2013-12-31: 10 mL

## 2013-12-31 MED ORDER — HEPARIN SOD (PORK) LOCK FLUSH 100 UNIT/ML IV SOLN
INTRAVENOUS | Status: AC
  Filled 2013-12-31: qty 5

## 2013-12-31 NOTE — Patient Instructions (Addendum)
[place] Discharge Instructions  RECOMMENDATIONS MADE BY THE CONSULTANT AND ANY TEST RESULTS WILL BE SENT TO YOUR REFERRING PHYSICIAN.  Rituxan today, then weekly for three weeks. Office visits with Dr. Barnet Glasgow weekly when you are here for your Rituxan treatments.   Thank you for choosing [facility] to provide your oncology and hematology care.  To afford each patient quality time with our providers, please arrive at least 15 minutes before your scheduled appointment time.  With your help, our goal is to use those 15 minutes to complete the necessary work-up to ensure our physicians have the information they need to help with your evaluation and healthcare recommendations.    Effective January 1st, 2014, we ask that you re-schedule your appointment with our physicians should you arrive 10 or more minutes late for your appointment.  We strive to give you quality time with our providers, and arriving late affects you and other patients whose appointments are after yours.    Again, thank you for choosing [facility].  Our hope is that these requests will decrease the amount of time that you wait before being seen by our physicians.       _____________________________________________________________  Should you have questions after your visit to [facility], please contact our office at [phone] between the hours of 8:30 a.m. and 4:30 p.m.  Voicemails left after 4:30 p.m. will not be returned until the following business day.  For prescription refill requests, have your pharmacy contact our office with your prescription refill request.    _______________________________________________________________  We hope that we have given you very good care.  You may receive a patient satisfaction survey in the mail, please complete it and return it as soon as possible.  We value your  feedback!  _______________________________________________________________  Have you asked about our STAR program?  STAR stands for Survivorship Training and Rehabilitation, and this is a nationally recognized cancer care program that focuses on survivorship and rehabilitation.  Cancer and cancer treatments may cause problems, such as, pain, making you feel tired and keeping you from doing the things that you need or want to do. Cancer rehabilitation can help. Our goal is to reduce these troubling effects and help you have the best quality of life possible.  You may receive a survey from a nurse that asks questions about your current state of health.  Based on the survey results, all eligible patients will be referred to the [facility] program for an evaluation so we can better serve you!  A frequently asked questions sheet is available upon request. Rituximab injection What is this medicine? RITUXIMAB (ri TUX i mab) is a monoclonal antibody. This medicine changes the way the body's immune system works. It is used commonly to treat non-Hodgkin's lymphoma and other conditions. In cancer cells, this drug targets a specific protein within cancer cells and stops the cancer cells from growing. It is also used to treat rhuematoid arthritis (RA). In RA, this medicine slow the inflammatory process and help reduce joint pain and swelling. This medicine is often used with other cancer or arthritis medications. This medicine may be used for other purposes; ask your health care provider or pharmacist if you have questions. COMMON BRAND NAME(S): Rituxan What should I tell my health care provider before I take this medicine? They need to know if you have any of these conditions: -blood disorders -heart disease -history of hepatitis B -infection (especially a virus infection  such as chickenpox, cold sores, or herpes) -irregular heartbeat -kidney disease -lung or breathing disease, like asthma -lupus -an unusual  or allergic reaction to rituximab, mouse proteins, other medicines, foods, dyes, or preservatives -pregnant or trying to get pregnant -breast-feeding How should I use this medicine? This medicine is for infusion into a vein. It is administered in a hospital or clinic by a specially trained health care professional. A special MedGuide will be given to you by the pharmacist with each prescription and refill. Be sure to read this information carefully each time. Talk to your pediatrician regarding the use of this medicine in children. This medicine is not approved for use in children. Overdosage: If you think you have taken too much of this medicine contact a poison control center or emergency room at once. NOTE: This medicine is only for you. Do not share this medicine with others. What if I miss a dose? It is important not to miss a dose. Call your doctor or health care professional if you are unable to keep an appointment. What may interact with this medicine? -cisplatin -medicines for blood pressure -some other medicines for arthritis -vaccines This list may not describe all possible interactions. Give your health care provider a list of all the medicines, herbs, non-prescription drugs, or dietary supplements you use. Also tell them if you smoke, drink alcohol, or use illegal drugs. Some items may interact with your medicine. What should I watch for while using this medicine? Report any side effects that you notice during your treatment right away, such as changes in your breathing, fever, chills, dizziness or lightheadedness. These effects are more common with the first dose. Visit your prescriber or health care professional for checks on your progress. You will need to have regular blood work. Report any other side effects. The side effects of this medicine can continue after you finish your treatment. Continue your course of treatment even though you feel ill unless your doctor tells you to  stop. Call your doctor or health care professional for advice if you get a fever, chills or sore throat, or other symptoms of a cold or flu. Do not treat yourself. This drug decreases your body's ability to fight infections. Try to avoid being around people who are sick. This medicine may increase your risk to bruise or bleed. Call your doctor or health care professional if you notice any unusual bleeding. Be careful brushing and flossing your teeth or using a toothpick because you may get an infection or bleed more easily. If you have any dental work done, tell your dentist you are receiving this medicine. Avoid taking products that contain aspirin, acetaminophen, ibuprofen, naproxen, or ketoprofen unless instructed by your doctor. These medicines may hide a fever. Do not become pregnant while taking this medicine. Women should inform their doctor if they wish to become pregnant or think they might be pregnant. There is a potential for serious side effects to an unborn child. Talk to your health care professional or pharmacist for more information. Do not breast-feed an infant while taking this medicine. What side effects may I notice from receiving this medicine? Side effects that you should report to your doctor or health care professional as soon as possible: -allergic reactions like skin rash, itching or hives, swelling of the face, lips, or tongue -low blood counts - this medicine may decrease the number of white blood cells, red blood cells and platelets. You may be at increased risk for infections and bleeding. -signs of  infection - fever or chills, cough, sore throat, pain or difficulty passing urine -signs of decreased platelets or bleeding - bruising, pinpoint red spots on the skin, black, tarry stools, blood in the urine -signs of decreased red blood cells - unusually weak or tired, fainting spells, lightheadedness -breathing problems -confused, not responsive -chest pain -fast, irregular  heartbeat -feeling faint or lightheaded, falls -mouth sores -redness, blistering, peeling or loosening of the skin, including inside the mouth -stomach pain -swelling of the ankles, feet, or hands -trouble passing urine or change in the amount of urine Side effects that usually do not require medical attention (report to your doctor or other health care professional if they continue or are bothersome): -anxiety -headache -loss of appetite -muscle aches -nausea -night sweats This list may not describe all possible side effects. Call your doctor for medical advice about side effects. You may report side effects to FDA at 1-800-FDA-1088. Where should I keep my medicine? This drug is given in a hospital or clinic and will not be stored at home. NOTE: This sheet is a summary. It may not cover all possible information. If you have questions about this medicine, talk to your doctor, pharmacist, or health care provider.  2015, Elsevier/Gold Standard. (2008-02-16 14:04:59)

## 2013-12-31 NOTE — Progress Notes (Signed)
Tolerated well

## 2013-12-31 NOTE — Progress Notes (Signed)
Tina Bogus, MD [address] [place] [address]  Lymphocytic lymphoma  CURRENT THERAPY: Rituxan single-agent beginning on 12/31/2013  INTERVAL HISTORY: [name] 58 y.o. female returns for  regular  visit for followup of Recurrent well-differentiated lymphocytic lymphoma involving the left axilla and right upper lobe, status post wedge resection of right upper lobe lesion.    Lymphocytic lymphoma   12/02/2013 Pathology Interpretation Tissue-Flow Cytometry MONOCLONAL B CELL POPULATION IDENTIFIED.   12/02/2013 Initial Diagnosis Diagnosis Lung, wedge biopsy/resection, Right lower lobe - LOW GRADE NON-HODGKIN'S B CELL LYMPHOMA   12/31/2013 -  Chemotherapy Rituxan weekly x 4 cycles   I personally reviewed and went over laboratory results with the patient.  The results are noted within this dictation.  Today is treatment #1 and she denies any complaints or questions today.  I have ordered labs for next infusion in addition to labs in 3-4 weeks.    She continues to smoke cigarettes and smoking cessation education is provided today.  Hematologically, she denies any complaints and ROS questioning is negative.   Past Medical History  Diagnosis Date  . Coronary artery disease     status post stenting of the aortic and iliac vessels by Dr. Gwenlyn Found. Repeat aortic stenting 2010  . Left ventricular dysfunction     hx of with ejection fraction of 35% range.  . Carotid artery disease     hx of carotid cerebrovascular disease with 0-39% bilateral internal carotid artery setenosis   . Non Hodgkin's lymphoma   . Chronic back pain     status post lumbar surgery  . Hyperlipidemia   . Hypertension   . COPD (chronic obstructive pulmonary disease)   . Shortness of breath   . Cancer     hx of non hogdin lymphoma  . CHF (congestive heart failure)   . Blood transfusion   . Arthritis   . Myocardial infarction   . Peripheral vascular disease   . GERD (gastroesophageal  reflux disease)   . Anemia   . Osteoporosis   . Depression   . Anxiety   . Other malignant lymphomas, unspecified site, extranodal and solid organ sites 2004    Back  . Asthma   . Vulvar cancer   . Anginal pain     occ last time last wk  . Dysrhythmia     ?  . Stroke 04  . Pneumonia     hx  . History of kidney stones   . Seizures     ? yrs ago  . Lymphocytic lymphoma 12/24/2013    has HYPERLIPIDEMIA; HYPERTENSION, BENIGN; CAD, NATIVE VESSEL; LEFT VENTRICULAR FUNCTION, DECREASED; CAROTID ARTERY DISEASE; AORTIC ATHEROSCLEROSIS; ATHEROSCLEROSIS, RENAL ARTERY; Atherosclerosis of native arteries of the extremities with rest pain; PVD; COPD; NON-HODGKIN'S LYMPHOMA, HX OF; Vulvar intraepithelial neoplasia III (VIN III); Vulvar cancer; Mass of lung, right upper lobe; Langerhans cell histiocytosis of lung, 2009; Lung mass; and Lymphocytic lymphoma on her problem list.     has No Known Allergies.  [name] does not currently have medications on file.  Past Surgical History  Procedure Laterality Date  . Lumar surg    . Back surgery      x 2  . Lung biopsy    . Cardiac catheterization    . Abdominal aortogram  08/08/2011  . Abdominal hysterectomy      partial  . Portacath placement      pt says its for chemo only and its an  old port(04)   . Dilation and curettage of uterus    . Vulva /perineum biopsy  04/09/2012    Procedure: VULVAR BIOPSY;  Surgeon: Florian Buff, MD;  Location: [facility];  Service: Gynecology;  Laterality: N/A;  . Vulvectomy  05/20/2012    Procedure: VULVECTOMY;  Surgeon: Alvino Chapel, MD;  Location: [facility];  Service: Gynecology;  Laterality: Bilateral;  BILATERAL SIMPLE VULVECTOMIES  . Video assisted thoracoscopy (vats)/wedge resection Right 12/02/2013    Procedure: VIDEO ASSISTED THORACOSCOPY (VATS)/WEDGE RESECTION;  Surgeon: Melrose Nakayama, MD;  Location: [facility];  Service: Thoracic;  Laterality: Right;    Ddenies any headaches, dizziness, double  vision, fevers, chills, night sweats, nausea, vomiting, diarrhea, constipation, chest pain, heart palpitations, shortness of breath, blood in stool, black tarry stool, urinary pain, urinary burning, urinary frequency, hematuria.   PHYSICAL EXAMINATION  ECOG PERFORMANCE STATUS: 1 - Symptomatic but completely ambulatory  Filed Vitals:   12/31/13 0920  BP: 135/58  Pulse: 80  Temp: 98.2 F (36.8 C)  Resp: 20    GENERAL:alert, no distress, well nourished, well developed, comfortable, cooperative, obese, smiling and chronically ill appearing and appearing older than her stated age SKIN: skin color, texture, turgor are normal, no rashes or significant lesions, thickened facial skin HEAD: Normocephalic, No masses, lesions, tenderness or abnormalities EYES: normal, PERRLA, EOMI EARS: External ears normal OROPHARYNX:mucous membranes are moist  NECK: supple, trachea midline LYMPH:  not examined BREAST:not examined LUNGS: clear to auscultation , decreased breath sounds HEART: regular rate & rhythm, no murmurs, no gallops, S1 normal and S2 normal ABDOMEN:abdomen soft, non-tender, obese and normal bowel sounds BACK: Back symmetric, no curvature. EXTREMITIES:less then 2 second capillary refill, no joint deformities, effusion, or inflammation, no skin discoloration  NEURO: alert & oriented x 3 with fluent speech, no focal motor/sensory deficits   LABORATORY DATA: CBC    Component Value Date/Time   WBC 6.9 12/24/2013 1119   RBC 3.98 12/24/2013 1119   RBC 3.98 12/24/2013 1119   HGB 12.2 12/24/2013 1119   HCT 36.5 12/24/2013 1119   PLT 139* 12/24/2013 1119   MCV 91.7 12/24/2013 1119   MCH 30.7 12/24/2013 1119   MCHC 33.4 12/24/2013 1119   RDW 14.8 12/24/2013 1119   LYMPHSABS 1.6 12/24/2013 1119   MONOABS 0.8 12/24/2013 1119   EOSABS 0.2 12/24/2013 1119   BASOSABS 0.0 12/24/2013 1119      Chemistry      Component Value Date/Time   NA 134* 12/24/2013 1119   K 4.5 12/24/2013 1119   CL 92*  12/24/2013 1119   CO2 28 12/24/2013 1119   BUN 17 12/24/2013 1119   CREATININE 1.56* 12/24/2013 1119      Component Value Date/Time   CALCIUM 9.3 12/24/2013 1119   ALKPHOS 189* 12/24/2013 1119   AST 19 12/24/2013 1119   ALT 27 12/24/2013 1119   BILITOT 0.4 12/24/2013 1119      Results for Tina, Yu (MRN [id number]) as of 12/31/2013 10:18  Ref. Range 12/24/2013 11:19  LDH Latest Range: 94-250 U/L 183   Results for THEDORA, RINGS (MRN [id number]) as of 12/31/2013 10:18  Ref. Range 12/24/2013 11:19  Beta-2 Microglobulin Latest Range: <=2.51 mg/L 5.31 (H)       ASSESSMENT:  1. Recurrent well-differentiated lymphocytic lymphoma involving the left axilla and right upper lobe, status post wedge resection of right upper lobe lesion. 2. History of pulmonary histiocytosis of the lung resected in 2009 and treated with prednisone afterwards, no  longer on treatment. 3. Chronic obstructive pulmonary disease, continuing to smoke. 4. Peripheral vascular disease, status post bilateral lower extremity stenting, carotid endarterectomy, and history of previous right cerebral thrombosis with minimal to no neurologic deficit. 5. MRSA colonization.  Patient Active Problem List   Diagnosis Date Noted  . Lymphocytic lymphoma 12/24/2013  . Lung mass 12/02/2013  . Langerhans cell histiocytosis of lung, 2009 11/10/2013  . Mass of lung, right upper lobe 10/19/2013  . 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 Vulvar cancer 05/02/2012  . AORTIC ATHEROSCLEROSIS 08/12/2009  . ATHEROSCLEROSIS, RENAL ARTERY 08/12/2009  . Atherosclerosis of native arteries of the extremities with rest pain 12/10/2008  . HYPERLIPIDEMIA 10/27/2008  . HYPERTENSION, BENIGN 10/27/2008  . CAD, NATIVE VESSEL 10/27/2008  . LEFT VENTRICULAR FUNCTION, DECREASED 10/27/2008  . CAROTID ARTERY DISEASE 10/27/2008  . PVD 10/27/2008  . COPD 10/27/2008  . NON-HODGKIN'S LYMPHOMA, HX OF 10/27/2008     PLAN:  1. I  personally reviewed and went over laboratory results with the patient.  The results are noted within this dictation. 2. Discussion regarding her treatment plan 3. Smoking cessation education provided 4. Pre-chemo labs: CBC diff, CMET weekly 5. Labs in 3 weeks: LDH, ESR, Beta2 microglobulin 6. Return as scheduled in 1 week.   THERAPY PLAN:  We will plan on administering Rituxan weekly x 4 and then re-evaluation with imaging studies.  All questions were answered. The patient knows to call the clinic with any problems, questions or concerns. We can certainly see the patient much sooner if necessary.  Patient and plan discussed with Dr. Farrel Gobble and he is in agreement with the aforementioned.   KEFALAS,THOMAS 12/31/2013

## 2013-12-31 NOTE — Patient Instructions (Signed)

## 2014-01-07 ENCOUNTER — Encounter (HOSPITAL_BASED_OUTPATIENT_CLINIC_OR_DEPARTMENT_OTHER): Payer: Medicare Other

## 2014-01-07 ENCOUNTER — Encounter (HOSPITAL_COMMUNITY): Payer: Self-pay

## 2014-01-07 VITALS — BP 110/49 | HR 62 | Temp 97.6°F | Resp 18 | Wt 167.8 lb

## 2014-01-07 DIAGNOSIS — C8300 Small cell B-cell lymphoma, unspecified site: Secondary | ICD-10-CM

## 2014-01-07 DIAGNOSIS — C8589 Other specified types of non-Hodgkin lymphoma, extranodal and solid organ sites: Secondary | ICD-10-CM | POA: Diagnosis not present

## 2014-01-07 DIAGNOSIS — J449 Chronic obstructive pulmonary disease, unspecified: Secondary | ICD-10-CM

## 2014-01-07 DIAGNOSIS — C8588 Other specified types of non-Hodgkin lymphoma, lymph nodes of multiple sites: Secondary | ICD-10-CM

## 2014-01-07 DIAGNOSIS — I739 Peripheral vascular disease, unspecified: Secondary | ICD-10-CM

## 2014-01-07 DIAGNOSIS — C519 Malignant neoplasm of vulva, unspecified: Secondary | ICD-10-CM

## 2014-01-07 DIAGNOSIS — C8599 Non-Hodgkin lymphoma, unspecified, extranodal and solid organ sites: Secondary | ICD-10-CM | POA: Diagnosis present

## 2014-01-07 DIAGNOSIS — J8482 Adult pulmonary Langerhans cell histiocytosis: Secondary | ICD-10-CM

## 2014-01-07 DIAGNOSIS — Z5112 Encounter for antineoplastic immunotherapy: Secondary | ICD-10-CM

## 2014-01-07 LAB — COMPREHENSIVE METABOLIC PANEL
ALBUMIN: 3.3 g/dL — AB (ref 3.5–5.2)
ALT: 21 U/L (ref 0–35)
AST: 18 U/L (ref 0–37)
Alkaline Phosphatase: 225 U/L — ABNORMAL HIGH (ref 39–117)
Anion gap: 12 (ref 5–15)
BUN: 13 mg/dL (ref 6–23)
CALCIUM: 9.2 mg/dL (ref 8.4–10.5)
CO2: 30 mEq/L (ref 19–32)
CREATININE: 1.54 mg/dL — AB (ref 0.50–1.10)
Chloride: 96 mEq/L (ref 96–112)
GFR calc Af Amer: 42 mL/min — ABNORMAL LOW (ref >90)
GFR calc non Af Amer: 37 mL/min — ABNORMAL LOW (ref >90)
Glucose, Bld: 189 mg/dL — ABNORMAL HIGH (ref 70–99)
Potassium: 3.8 mEq/L (ref 3.7–5.3)
Sodium: 138 mEq/L (ref 137–147)
Total Bilirubin: 0.3 mg/dL (ref 0.3–1.2)
Total Protein: 6.8 g/dL (ref 6.0–8.3)

## 2014-01-07 LAB — CBC WITH DIFFERENTIAL/PLATELET
Basophils Absolute: 0 10*3/uL (ref 0.0–0.1)
Basophils Relative: 0 % (ref 0–1)
EOS PCT: 4 % (ref 0–5)
Eosinophils Absolute: 0.1 10*3/uL (ref 0.0–0.7)
HEMATOCRIT: 34.7 % — AB (ref 36.0–46.0)
HEMOGLOBIN: 11.6 g/dL — AB (ref 12.0–15.0)
LYMPHS ABS: 1.2 10*3/uL (ref 0.7–4.0)
Lymphocytes Relative: 30 % (ref 12–46)
MCH: 30.4 pg (ref 26.0–34.0)
MCHC: 33.4 g/dL (ref 30.0–36.0)
MCV: 90.8 fL (ref 78.0–100.0)
MONOS PCT: 15 % — AB (ref 3–12)
Monocytes Absolute: 0.6 10*3/uL (ref 0.1–1.0)
Neutro Abs: 2.1 10*3/uL (ref 1.7–7.7)
Neutrophils Relative %: 51 % (ref 43–77)
PLATELETS: 156 10*3/uL (ref 150–400)
RBC: 3.82 MIL/uL — AB (ref 3.87–5.11)
RDW: 15.9 % — ABNORMAL HIGH (ref 11.5–15.5)
WBC: 4.1 10*3/uL (ref 4.0–10.5)

## 2014-01-07 MED ORDER — ACETAMINOPHEN 325 MG PO TABS
ORAL_TABLET | ORAL | Status: AC
  Filled 2014-01-07: qty 2

## 2014-01-07 MED ORDER — SODIUM CHLORIDE 0.9 % IJ SOLN
10.0000 mL | INTRAMUSCULAR | Status: DC | PRN
  Administered 2014-01-07 (×2): 10 mL

## 2014-01-07 MED ORDER — SODIUM CHLORIDE 0.9 % IV SOLN
Freq: Once | INTRAVENOUS | Status: AC
  Administered 2014-01-07: 09:00:00 via INTRAVENOUS

## 2014-01-07 MED ORDER — SODIUM CHLORIDE 0.9 % IV SOLN
700.0000 mg | Freq: Once | INTRAVENOUS | Status: AC
  Administered 2014-01-07: 700 mg via INTRAVENOUS
  Filled 2014-01-07: qty 70

## 2014-01-07 MED ORDER — DIPHENHYDRAMINE HCL 25 MG PO CAPS
50.0000 mg | ORAL_CAPSULE | Freq: Once | ORAL | Status: AC
  Administered 2014-01-07: 50 mg via ORAL

## 2014-01-07 MED ORDER — SODIUM CHLORIDE 0.9 % IV SOLN
375.0000 mg/m2 | Freq: Once | INTRAVENOUS | Status: DC
  Filled 2014-01-07: qty 70

## 2014-01-07 MED ORDER — ACETAMINOPHEN 325 MG PO TABS
650.0000 mg | ORAL_TABLET | Freq: Once | ORAL | Status: AC
  Administered 2014-01-07: 650 mg via ORAL

## 2014-01-07 MED ORDER — HEPARIN SOD (PORK) LOCK FLUSH 100 UNIT/ML IV SOLN
500.0000 [IU] | Freq: Once | INTRAVENOUS | Status: AC | PRN
  Administered 2014-01-07: 500 [IU]
  Filled 2014-01-07: qty 5

## 2014-01-07 MED ORDER — DIPHENHYDRAMINE HCL 25 MG PO CAPS
ORAL_CAPSULE | ORAL | Status: AC
  Filled 2014-01-07: qty 2

## 2014-01-07 NOTE — Progress Notes (Signed)
[facility]  OFFICE PROGRESS NOTE  [name], MD [place] [address]  DIAGNOSIS: Lymphocytic lymphoma  Langerhans cell histiocytosis of lung  Vulvar cancer  Peripheral vascular disease, unspecified  Chief Complaint  Patient presents with  . Follicular lymphoma    CURRENT THERAPY: Rituxan IV weekly for cycle #2 today of a planned 4.  INTERVAL HISTORY: [name] 58 y.o. female returns for return to receive additional Rituxan for cycle #2 of a planned 4 for recurrent follicular lymphoma involving the lung and left axilla.  She tolerated treatment well last week. Chronic cough persists smoking a half-pack of cigarettes daily. She's had night sweats and a sweats not unchanged for many years. Bowel movements are regular with no melena, hematochezia, hematuria, vaginal bleeding, incontinence, lower extremity swelling or redness, skin rash, sore throat, headache, or seizures.  MEDICAL HISTORY: Past Medical History  Diagnosis Date  . Coronary artery disease     status post stenting of the aortic and iliac vessels by Dr. Gwenlyn Found. Repeat aortic stenting 2010  . Left ventricular dysfunction     hx of with ejection fraction of 35% range.  . Carotid artery disease     hx of carotid cerebrovascular disease with 0-39% bilateral internal carotid artery setenosis   . Non Hodgkin's lymphoma   . Chronic back pain     status post lumbar surgery  . Hyperlipidemia   . Hypertension   . COPD (chronic obstructive pulmonary disease)   . Shortness of breath   . Cancer     hx of non hogdin lymphoma  . CHF (congestive heart failure)   . Blood transfusion   . Arthritis   . Myocardial infarction   . Peripheral vascular disease   . GERD (gastroesophageal reflux disease)   . Anemia   . Osteoporosis   . Depression   . Anxiety   . Other malignant lymphomas, unspecified site, extranodal and solid organ sites  2004    Back  . Asthma   . Vulvar cancer   . Anginal pain     occ last time last wk  . Dysrhythmia     ?  . Stroke 04  . Pneumonia     hx  . History of kidney stones   . Seizures     ? yrs ago  . Lymphocytic lymphoma 12/24/2013    INTERIM HISTORY: has HYPERLIPIDEMIA; HYPERTENSION, BENIGN; CAD, NATIVE VESSEL; LEFT VENTRICULAR FUNCTION, DECREASED; CAROTID ARTERY DISEASE; AORTIC ATHEROSCLEROSIS; ATHEROSCLEROSIS, RENAL ARTERY; Atherosclerosis of native arteries of the extremities with rest pain; PVD; COPD; NON-HODGKIN'S LYMPHOMA, HX OF; Vulvar intraepithelial neoplasia III (VIN III); Vulvar cancer; Langerhans cell histiocytosis of lung, 2009; and Lymphocytic lymphoma on her problem list.   Lymphocytic lymphoma  12/02/2013  Pathology  Interpretation Tissue-Flow Cytometry MONOCLONAL B CELL POPULATION IDENTIFIED.  12/02/2013  Initial Diagnosis  Diagnosis Lung, wedge biopsy/resection, Right lower lobe - LOW GRADE NON-HODGKIN'S B CELL LYMPHOMA  12/31/2013 -  Chemotherapy  Rituxan weekly x 4 cycles  In September of 2004 the patient presented with a several month history of left-sided back pain and was seen in the emergency room at which time a paravertebral left-sided mass at T3 was seen. A core biopsy was consistent with follicular non-Hodgkin's lymphoma with some elements of large cell. PET CT scan revealed mediastinal lymph nodes. Treated with 4 cycles of CHOP-R. started on 03/22/2003 through 05/25/2003 with vincristine dose decreased for the  last 2 cycles due to peripheral neuropathy. She received Rituxan maintenance at 2 month intervals for one year and beginning in March of 2006 Rituxan was given at 3 month intervals with treatment ending in December 2006.  She relapsed with a mass at T7 presenting again with back pain at that time treated with Cytoxan and Fludara along with Rituxan starting in July of 2009. Because of myelotoxicity Cytoxan was held for one cycle with repeat study showing no  evidence of disease but with persistent thrombocytopenia in the 50-80,000 range. Her paraspinal mass disappeared.. Repeat CT scans on 03/10/2012 showed no evidence of lymphoma.  Sometime in 2009, the patient developed pulmonary lesions and was operated on by Dr. Arlyce Dice at which time Langerhans histiocytosis was diagnosed. Patient was treated with prednisone tapering him 60 mg down to 10 mg a day continued till July of 2013.  PET scan was ordered to restage the patient in 2015 with findings of abnormality in the right upper lobe of the lung as well as the left axilla. Left axilla biopsy was consistent with follicular lymphoma and the patient is currently being evaluated by thoracic surgery for resection of the pulmonary nodule which appears to be a primary lung cancer. Wedge resection of the right lower lobe nodule performed on 03/04/2670 revealed follicular lymphoma identical to left axillary biopsy performed on 09/28/2013    ALLERGIES:  has No Known Allergies.  MEDICATIONS: has a current medication list which includes the following prescription(s): albuterol, alprazolam, amitriptyline, aspirin, bupropion, clopidogrel, cyclosporine, diazepam, furosemide, isosorbide mononitrate, metoprolol, mupirocin ointment, nexium, nitroglycerin, oxycodone, oxycodone, rosuvastatin, and sulfamethoxazole-trimethoprim, and the following Facility-Administered Medications: sodium chloride, heparin lock flush, and sodium chloride.  SURGICAL HISTORY:  Past Surgical History  Procedure Laterality Date  . [facility] surg    . Back surgery      x 2  . Lung biopsy    . Cardiac catheterization    . Abdominal aortogram  08/08/2011  . Abdominal hysterectomy      partial  . Portacath placement      pt says its for chemo only and its an old port(04)   . Dilation and curettage of uterus    . Vulva /perineum biopsy  04/09/2012    Procedure: VULVAR BIOPSY;  Surgeon: Florian Buff, MD;  Location: [facility];  Service: Gynecology;  Laterality:  N/A;  . Vulvectomy  05/20/2012    Procedure: VULVECTOMY;  Surgeon: Alvino Chapel, MD;  Location: [facility];  Service: Gynecology;  Laterality: Bilateral;  BILATERAL SIMPLE VULVECTOMIES  . Video assisted thoracoscopy (vats)/wedge resection Right 12/02/2013    Procedure: VIDEO ASSISTED THORACOSCOPY (VATS)/WEDGE RESECTION;  Surgeon: Melrose Nakayama, MD;  Location: [facility] OR;  Service: Thoracic;  Laterality: Right;    FAMILY HISTORY: family history includes Aortic aneurysm in her sister; Asthma in her son; Coronary artery disease in an other family member; Diabetes in her sister; Endometriosis in her daughter; Heart attack in her brother, father, and mother; Heart disease in her father and mother; Heart failure in her father and mother; Kidney cancer in her father; Lung cancer in her mother; Sleep apnea in her son.  SOCIAL HISTORY:  reports that she has been smoking Cigarettes.  She has a 100 pack-year smoking history. She has never used smokeless tobacco. She reports that she does not drink alcohol or use illicit drugs.  REVIEW OF SYSTEMS:  Other than that discussed above is noncontributory.  PHYSICAL EXAMINATION: ECOG PERFORMANCE STATUS: 1 - Symptomatic but completely ambulatory  There  were no vitals taken for this visit.  GENERAL:alert, no distress and comfortable SKIN: skin color, texture, turgor are normal, no rashes or significant lesions EYES: PERLA; Conjunctiva are pink and non-injected, sclera clear SINUSES: No redness or tenderness over maxillary or ethmoid sinuses OROPHARYNX:no exudate, no erythema on lips, buccal mucosa, or tongue. NECK: supple, thyroid normal size, non-tender, without nodularity. No masses CHEST: Increased AP diameter with life port in place. LYMPH:  no palpable lymphadenopathy in the cervical, axillary or inguinal LUNGS: clear to auscultation and percussion with normal breathing effort HEART: regular rate & rhythm and no murmurs. ABDOMEN:abdomen soft,  non-tender and normal bowel sounds. No hepatosplenomegaly, ascites, or CVA tenderness. MUSCULOSKELETAL:no cyanosis of digits and no clubbing. Range of motion normal.  NEURO: alert & oriented x 3 with fluent speech, no focal motor/sensory deficits   LABORATORY DATA: Infusion on 01/07/2014  Component Date Value Ref Range Status  . WBC 01/07/2014 4.1  4.0 - 10.5 K/uL Final  . RBC 01/07/2014 3.82* 3.87 - 5.11 MIL/uL Final  . Hemoglobin 01/07/2014 11.6* 12.0 - 15.0 g/dL Final  . HCT 01/07/2014 34.7* 36.0 - 46.0 % Final  . MCV 01/07/2014 90.8  78.0 - 100.0 fL Final  . MCH 01/07/2014 30.4  26.0 - 34.0 pg Final  . MCHC 01/07/2014 33.4  30.0 - 36.0 g/dL Final  . RDW 01/07/2014 15.9* 11.5 - 15.5 % Final  . Platelets 01/07/2014 156  150 - 400 K/uL Final  . Neutrophils Relative % 01/07/2014 51  43 - 77 % Final  . Neutro Abs 01/07/2014 2.1  1.7 - 7.7 K/uL Final  . Lymphocytes Relative 01/07/2014 30  12 - 46 % Final  . Lymphs Abs 01/07/2014 1.2  0.7 - 4.0 K/uL Final  . Monocytes Relative 01/07/2014 15* 3 - 12 % Final  . Monocytes Absolute 01/07/2014 0.6  0.1 - 1.0 K/uL Final  . Eosinophils Relative 01/07/2014 4  0 - 5 % Final  . Eosinophils Absolute 01/07/2014 0.1  0.0 - 0.7 K/uL Final  . Basophils Relative 01/07/2014 0  0 - 1 % Final  . Basophils Absolute 01/07/2014 0.0  0.0 - 0.1 K/uL Final  . Sodium 01/07/2014 138  137 - 147 mEq/L Final  . Potassium 01/07/2014 3.8  3.7 - 5.3 mEq/L Final  . Chloride 01/07/2014 96  96 - 112 mEq/L Final  . CO2 01/07/2014 30  19 - 32 mEq/L Final  . Glucose, Bld 01/07/2014 189* 70 - 99 mg/dL Final  . BUN 01/07/2014 13  6 - 23 mg/dL Final  . Creatinine, Ser 01/07/2014 1.54* 0.50 - 1.10 mg/dL Final  . Calcium 01/07/2014 9.2  8.4 - 10.5 mg/dL Final  . Total Protein 01/07/2014 6.8  6.0 - 8.3 g/dL Final  . Albumin 01/07/2014 3.3* 3.5 - 5.2 g/dL Final  . AST 01/07/2014 18  0 - 37 U/L Final  . ALT 01/07/2014 21  0 - 35 U/L Final  . Alkaline Phosphatase 01/07/2014  225* 39 - 117 U/L Final  . Total Bilirubin 01/07/2014 0.3  0.3 - 1.2 mg/dL Final  . GFR calc non Af Amer 01/07/2014 37* >90 mL/min Final  . GFR calc Af Amer 01/07/2014 42* >90 mL/min Final   Comment: (NOTE)                          The eGFR has been calculated using the CKD EPI equation.  This calculation has not been validated in all clinical situations.                          eGFR's persistently <90 mL/min signify possible Chronic Kidney                          Disease.  . Anion gap 01/07/2014 12  5 - 15 Final  Infusion on 12/24/2013  Component Date Value Ref Range Status  . Hep B C IgM 12/24/2013 NON REACTIVE  NON REACTIVE Final   Comment: (NOTE)                          High levels of Hepatitis B Core IgM antibody are detectable                          during the acute stage of Hepatitis B. This antibody is used                          to differentiate current from past HBV infection.                          Performed at [facility]  . Hepatitis B Surface Ag 12/24/2013 NEGATIVE  NEGATIVE Final   Performed at [facility]  Appointment on 12/24/2013  Component Date Value Ref Range Status  . WBC 12/24/2013 6.9  4.0 - 10.5 K/uL Final  . RBC 12/24/2013 3.98  3.87 - 5.11 MIL/uL Final  . Hemoglobin 12/24/2013 12.2  12.0 - 15.0 g/dL Final  . HCT 12/24/2013 36.5  36.0 - 46.0 % Final  . MCV 12/24/2013 91.7  78.0 - 100.0 fL Final  . MCH 12/24/2013 30.7  26.0 - 34.0 pg Final  . MCHC 12/24/2013 33.4  30.0 - 36.0 g/dL Final  . RDW 12/24/2013 14.8  11.5 - 15.5 % Final  . Platelets 12/24/2013 139* 150 - 400 K/uL Final  . Neutrophils Relative % 12/24/2013 62  43 - 77 % Final  . Neutro Abs 12/24/2013 4.3  1.7 - 7.7 K/uL Final  . Lymphocytes Relative 12/24/2013 23  12 - 46 % Final  . Lymphs Abs 12/24/2013 1.6  0.7 - 4.0 K/uL Final  . Monocytes Relative 12/24/2013 12  3 - 12 % Final  . Monocytes Absolute 12/24/2013 0.8  0.1 - 1.0 K/uL Final  .  Eosinophils Relative 12/24/2013 3  0 - 5 % Final  . Eosinophils Absolute 12/24/2013 0.2  0.0 - 0.7 K/uL Final  . Basophils Relative 12/24/2013 0  0 - 1 % Final  . Basophils Absolute 12/24/2013 0.0  0.0 - 0.1 K/uL Final  . Sodium 12/24/2013 134* 137 - 147 mEq/L Final  . Potassium 12/24/2013 4.5  3.7 - 5.3 mEq/L Final  . Chloride 12/24/2013 92* 96 - 112 mEq/L Final  . CO2 12/24/2013 28  19 - 32 mEq/L Final  . Glucose, Bld 12/24/2013 174* 70 - 99 mg/dL Final  . BUN 12/24/2013 17  6 - 23 mg/dL Final  . Creatinine, Ser 12/24/2013 1.56* 0.50 - 1.10 mg/dL Final  . Calcium 12/24/2013 9.3  8.4 - 10.5 mg/dL Final  . Total Protein 12/24/2013 6.9  6.0 - 8.3 g/dL Final  . Albumin 12/24/2013 3.3* 3.5 - 5.2 g/dL Final  .  AST 12/24/2013 19  0 - 37 U/L Final  . ALT 12/24/2013 27  0 - 35 U/L Final  . Alkaline Phosphatase 12/24/2013 189* 39 - 117 U/L Final  . Total Bilirubin 12/24/2013 0.4  0.3 - 1.2 mg/dL Final  . GFR calc non Af Amer 12/24/2013 36* >90 mL/min Final  . GFR calc Af Amer 12/24/2013 42* >90 mL/min Final   Comment: (NOTE)                          The eGFR has been calculated using the CKD EPI equation.                          This calculation has not been validated in all clinical situations.                          eGFR's persistently <90 mL/min signify possible Chronic Kidney                          Disease.  [name] LDH 12/24/2013 183  94 - 250 U/L Final  . Beta-2 Microglobulin 12/24/2013 5.31* <=2.51 mg/L Final   Performed at [facility]  . Retic Ct Pct 12/24/2013 1.7  0.4 - 3.1 % Final  . RBC. 12/24/2013 3.98  3.87 - 5.11 MIL/uL Final  . Retic Count, Manual 12/24/2013 67.7  19.0 - 186.0 K/uL Final    PATHOLOGY: Well-differentiated lymphocytic lymphoma  Urinalysis    Component Value Date/Time   COLORURINE YELLOW 11/30/2013 1330   APPEARANCEUR CLEAR 11/30/2013 1330   LABSPEC 1.012 11/30/2013 1330   PHURINE 6.0 11/30/2013 1330   GLUCOSEU NEGATIVE 11/30/2013 1330   HGBUR NEGATIVE  11/30/2013 1330   BILIRUBINUR NEGATIVE 11/30/2013 1330   KETONESUR NEGATIVE 11/30/2013 1330   PROTEINUR NEGATIVE 11/30/2013 1330   UROBILINOGEN 1.0 11/30/2013 1330   NITRITE NEGATIVE 11/30/2013 1330   LEUKOCYTESUR NEGATIVE 11/30/2013 1330    RADIOGRAPHIC STUDIES: Dg Chest 2 View  12/15/2013   CLINICAL DATA:  Langerhans cell histiocytosis of long, coughing up phlegm and blood intermittent many since November 30, 2013  EXAM: CHEST  2 VIEW  COMPARISON:  12/05/2013  FINDINGS: Mild cardiac enlargement is stable. Mild scarring lateral lingula unchanged. Port-A-Cath in unchanged position.  3 cm masslike opacity over the right heart border/medial right lung base, stable from prior study.  IMPRESSION: No change from 12/05/2013 with masslike opacity medial right lung base.   Electronically Signed   By: Skipper Cliche M.D.   On: 12/15/2013 13:08    ASSESSMENT:  1. Recurrent well-differentiated lymphocytic lymphoma involving the left axilla and right upper lobe, status post wedge resection of right upper lobe lesion, tolerated Rituxan well. 2. History of Langerhans histiocytosis of the lung resected in 2009 and treated with prednisone afterwards, no longer on treatment.  3. Chronic obstructive pulmonary disease, continuing to smoke.  4. Peripheral vascular disease, status post bilateral lower extremity stenting, carotid endarterectomy, and history of previous right cerebral thrombosis with minimal to no neurologic deficit.       PLAN:  1. Rituxan intravenously today and again weekly for 2 more treatments. 2. Office visit 2 weeks with CBC followed by maintenance Rituxan every 3 months beginning in October 2015.   All questions were answered. The patient knows to call the clinic with any problems, questions or concerns. We can certainly see  the patient much sooner if necessary.   I spent 25 minutes counseling the patient face to face. The total time spent in the appointment was 30 minutes.    Doroteo Bradford,  MD 01/07/2014 10:25 AM  DISCLAIMER:  This note was dictated with voice recognition software.  Similar sounding words can inadvertently be transcribed inaccurately and may not be corrected upon review.

## 2014-01-14 ENCOUNTER — Encounter (HOSPITAL_BASED_OUTPATIENT_CLINIC_OR_DEPARTMENT_OTHER): Payer: Medicare Other

## 2014-01-14 VITALS — BP 89/66 | HR 97 | Temp 97.2°F | Resp 20

## 2014-01-14 DIAGNOSIS — C8294 Follicular lymphoma, unspecified, lymph nodes of axilla and upper limb: Secondary | ICD-10-CM

## 2014-01-14 DIAGNOSIS — C8300 Small cell B-cell lymphoma, unspecified site: Secondary | ICD-10-CM

## 2014-01-14 DIAGNOSIS — Z5112 Encounter for antineoplastic immunotherapy: Secondary | ICD-10-CM

## 2014-01-14 DIAGNOSIS — C8589 Other specified types of non-Hodgkin lymphoma, extranodal and solid organ sites: Secondary | ICD-10-CM | POA: Diagnosis not present

## 2014-01-14 LAB — COMPREHENSIVE METABOLIC PANEL
ALT: 14 U/L (ref 0–35)
ANION GAP: 11 (ref 5–15)
AST: 15 U/L (ref 0–37)
Albumin: 3.2 g/dL — ABNORMAL LOW (ref 3.5–5.2)
Alkaline Phosphatase: 191 U/L — ABNORMAL HIGH (ref 39–117)
BUN: 11 mg/dL (ref 6–23)
CALCIUM: 8.8 mg/dL (ref 8.4–10.5)
CO2: 29 mEq/L (ref 19–32)
Chloride: 100 mEq/L (ref 96–112)
Creatinine, Ser: 1.54 mg/dL — ABNORMAL HIGH (ref 0.50–1.10)
GFR calc non Af Amer: 37 mL/min — ABNORMAL LOW (ref >90)
GFR, EST AFRICAN AMERICAN: 42 mL/min — AB (ref >90)
GLUCOSE: 154 mg/dL — AB (ref 70–99)
Potassium: 3.6 mEq/L — ABNORMAL LOW (ref 3.7–5.3)
Sodium: 140 mEq/L (ref 137–147)
TOTAL PROTEIN: 6.2 g/dL (ref 6.0–8.3)
Total Bilirubin: 0.3 mg/dL (ref 0.3–1.2)

## 2014-01-14 LAB — CBC WITH DIFFERENTIAL/PLATELET
Basophils Absolute: 0 10*3/uL (ref 0.0–0.1)
Basophils Relative: 0 % (ref 0–1)
EOS ABS: 0.1 10*3/uL (ref 0.0–0.7)
Eosinophils Relative: 2 % (ref 0–5)
HEMATOCRIT: 34.3 % — AB (ref 36.0–46.0)
HEMOGLOBIN: 11.2 g/dL — AB (ref 12.0–15.0)
LYMPHS PCT: 27 % (ref 12–46)
Lymphs Abs: 1.2 10*3/uL (ref 0.7–4.0)
MCH: 30.1 pg (ref 26.0–34.0)
MCHC: 32.7 g/dL (ref 30.0–36.0)
MCV: 92.2 fL (ref 78.0–100.0)
MONO ABS: 0.6 10*3/uL (ref 0.1–1.0)
MONOS PCT: 14 % — AB (ref 3–12)
Neutro Abs: 2.4 10*3/uL (ref 1.7–7.7)
Neutrophils Relative %: 57 % (ref 43–77)
Platelets: 148 10*3/uL — ABNORMAL LOW (ref 150–400)
RBC: 3.72 MIL/uL — AB (ref 3.87–5.11)
RDW: 16.3 % — ABNORMAL HIGH (ref 11.5–15.5)
WBC: 4.3 10*3/uL (ref 4.0–10.5)

## 2014-01-14 MED ORDER — SODIUM CHLORIDE 0.9 % IV SOLN
375.0000 mg/m2 | Freq: Once | INTRAVENOUS | Status: DC
  Filled 2014-01-14: qty 70

## 2014-01-14 MED ORDER — ACETAMINOPHEN 325 MG PO TABS
ORAL_TABLET | ORAL | Status: AC
  Filled 2014-01-14: qty 2

## 2014-01-14 MED ORDER — ACETAMINOPHEN 325 MG PO TABS
650.0000 mg | ORAL_TABLET | Freq: Once | ORAL | Status: AC
  Administered 2014-01-14: 650 mg via ORAL

## 2014-01-14 MED ORDER — DIPHENHYDRAMINE HCL 25 MG PO CAPS
ORAL_CAPSULE | ORAL | Status: AC
  Filled 2014-01-14: qty 2

## 2014-01-14 MED ORDER — SODIUM CHLORIDE 0.9 % IV SOLN
700.0000 mg | Freq: Once | INTRAVENOUS | Status: AC
  Administered 2014-01-14: 700 mg via INTRAVENOUS
  Filled 2014-01-14: qty 70

## 2014-01-14 MED ORDER — HEPARIN SOD (PORK) LOCK FLUSH 100 UNIT/ML IV SOLN
INTRAVENOUS | Status: AC
  Filled 2014-01-14: qty 5

## 2014-01-14 MED ORDER — DIPHENHYDRAMINE HCL 25 MG PO CAPS
50.0000 mg | ORAL_CAPSULE | Freq: Once | ORAL | Status: AC
  Administered 2014-01-14: 50 mg via ORAL

## 2014-01-14 MED ORDER — HEPARIN SOD (PORK) LOCK FLUSH 100 UNIT/ML IV SOLN
500.0000 [IU] | Freq: Once | INTRAVENOUS | Status: AC
  Administered 2014-01-14: 500 [IU] via INTRAVENOUS

## 2014-01-14 MED ORDER — SODIUM CHLORIDE 0.9 % IV SOLN
Freq: Once | INTRAVENOUS | Status: AC
  Administered 2014-01-14: 10:00:00 via INTRAVENOUS

## 2014-01-14 NOTE — Progress Notes (Signed)
Tolerated Rituxan infusion without any problems.

## 2014-01-21 ENCOUNTER — Encounter (HOSPITAL_BASED_OUTPATIENT_CLINIC_OR_DEPARTMENT_OTHER): Payer: Medicare Other

## 2014-01-21 ENCOUNTER — Encounter (HOSPITAL_COMMUNITY): Payer: Self-pay

## 2014-01-21 VITALS — BP 138/68 | HR 63 | Temp 98.0°F | Resp 20

## 2014-01-21 DIAGNOSIS — C8589 Other specified types of non-Hodgkin lymphoma, extranodal and solid organ sites: Secondary | ICD-10-CM | POA: Diagnosis not present

## 2014-01-21 DIAGNOSIS — C8294 Follicular lymphoma, unspecified, lymph nodes of axilla and upper limb: Secondary | ICD-10-CM

## 2014-01-21 DIAGNOSIS — C8300 Small cell B-cell lymphoma, unspecified site: Secondary | ICD-10-CM

## 2014-01-21 DIAGNOSIS — F17200 Nicotine dependence, unspecified, uncomplicated: Secondary | ICD-10-CM

## 2014-01-21 DIAGNOSIS — Z5112 Encounter for antineoplastic immunotherapy: Secondary | ICD-10-CM

## 2014-01-21 LAB — CBC WITH DIFFERENTIAL/PLATELET
BASOS ABS: 0 10*3/uL (ref 0.0–0.1)
Basophils Relative: 0 % (ref 0–1)
EOS ABS: 0.1 10*3/uL (ref 0.0–0.7)
Eosinophils Relative: 2 % (ref 0–5)
HCT: 37 % (ref 36.0–46.0)
Hemoglobin: 12.5 g/dL (ref 12.0–15.0)
Lymphocytes Relative: 25 % (ref 12–46)
Lymphs Abs: 0.9 10*3/uL (ref 0.7–4.0)
MCH: 31 pg (ref 26.0–34.0)
MCHC: 33.8 g/dL (ref 30.0–36.0)
MCV: 91.8 fL (ref 78.0–100.0)
MONO ABS: 0.4 10*3/uL (ref 0.1–1.0)
MONOS PCT: 10 % (ref 3–12)
NEUTROS PCT: 63 % (ref 43–77)
Neutro Abs: 2.4 10*3/uL (ref 1.7–7.7)
Platelets: 158 10*3/uL (ref 150–400)
RBC: 4.03 MIL/uL (ref 3.87–5.11)
RDW: 16.3 % — AB (ref 11.5–15.5)
WBC: 3.8 10*3/uL — ABNORMAL LOW (ref 4.0–10.5)

## 2014-01-21 LAB — SEDIMENTATION RATE: Sed Rate: 38 mm/hr — ABNORMAL HIGH (ref 0–22)

## 2014-01-21 LAB — COMPREHENSIVE METABOLIC PANEL
ALBUMIN: 3.5 g/dL (ref 3.5–5.2)
ALT: 17 U/L (ref 0–35)
ANION GAP: 12 (ref 5–15)
AST: 23 U/L (ref 0–37)
Alkaline Phosphatase: 188 U/L — ABNORMAL HIGH (ref 39–117)
BUN: 12 mg/dL (ref 6–23)
CHLORIDE: 99 meq/L (ref 96–112)
CO2: 29 mEq/L (ref 19–32)
CREATININE: 1.4 mg/dL — AB (ref 0.50–1.10)
Calcium: 9.5 mg/dL (ref 8.4–10.5)
GFR calc Af Amer: 48 mL/min — ABNORMAL LOW (ref >90)
GFR calc non Af Amer: 41 mL/min — ABNORMAL LOW (ref >90)
Glucose, Bld: 183 mg/dL — ABNORMAL HIGH (ref 70–99)
POTASSIUM: 4.2 meq/L (ref 3.7–5.3)
Sodium: 140 mEq/L (ref 137–147)
TOTAL PROTEIN: 6.8 g/dL (ref 6.0–8.3)
Total Bilirubin: 0.3 mg/dL (ref 0.3–1.2)

## 2014-01-21 LAB — LACTATE DEHYDROGENASE: LDH: 168 U/L (ref 94–250)

## 2014-01-21 MED ORDER — SODIUM CHLORIDE 0.9 % IV SOLN
Freq: Once | INTRAVENOUS | Status: AC
  Administered 2014-01-21: 10:00:00 via INTRAVENOUS

## 2014-01-21 MED ORDER — RITUXIMAB CHEMO INJECTION 10 MG/ML: 375.0000 mg/m2 | Freq: Once | INTRAVENOUS | Status: DC

## 2014-01-21 MED ORDER — SODIUM CHLORIDE 0.9 % IJ SOLN
10.0000 mL | INTRAMUSCULAR | Status: DC | PRN
  Administered 2014-01-21: 10 mL

## 2014-01-21 MED ORDER — DIPHENHYDRAMINE HCL 25 MG PO CAPS
50.0000 mg | ORAL_CAPSULE | Freq: Once | ORAL | Status: AC
  Administered 2014-01-21: 50 mg via ORAL
  Filled 2014-01-21: qty 2

## 2014-01-21 MED ORDER — ACETAMINOPHEN 325 MG PO TABS
650.0000 mg | ORAL_TABLET | Freq: Once | ORAL | Status: AC
  Administered 2014-01-21: 650 mg via ORAL
  Filled 2014-01-21: qty 2

## 2014-01-21 MED ORDER — SODIUM CHLORIDE 0.9 % IV SOLN
375.0000 mg/m2 | Freq: Once | INTRAVENOUS | Status: AC
  Administered 2014-01-21: 700 mg via INTRAVENOUS
  Filled 2014-01-21: qty 60

## 2014-01-21 MED ORDER — HEPARIN SOD (PORK) LOCK FLUSH 100 UNIT/ML IV SOLN
500.0000 [IU] | Freq: Once | INTRAVENOUS | Status: AC | PRN
  Administered 2014-01-21: 500 [IU]
  Filled 2014-01-21: qty 5

## 2014-01-21 NOTE — Patient Instructions (Signed)
[facility] Discharge Instructions  RECOMMENDATIONS MADE BY THE CONSULTANT AND ANY TEST RESULTS WILL BE SENT TO YOUR REFERRING PHYSICIAN.  EXAM FINDINGS BY THE PHYSICIAN TODAY AND SIGNS OR SYMPTOMS TO REPORT TO CLINIC OR PRIMARY PHYSICIAN: Exam and findings as discussed by Dr.Heller.  MEDICATIONS PRESCRIBED:  Continue all as prescribed.  INSTRUCTIONS/FOLLOW-UP: Port flush every 6 weeks. Lab work with next port flush. We will make a STAR referral to physical therapy to evaluate you for strengthening and conditioning needs. Rituxan and MD appointment in 12 weeks.  Thank you for choosing [facility] to provide your oncology and hematology care.  To afford each patient quality time with our providers, please arrive at least 15 minutes before your scheduled appointment time.  With your help, our goal is to use those 15 minutes to complete the necessary work-up to ensure our physicians have the information they need to help with your evaluation and healthcare recommendations.    Effective January 1st, 2014, we ask that you re-schedule your appointment with our physicians should you arrive 10 or more minutes late for your appointment.  We strive to give you quality time with our providers, and arriving late affects you and other patients whose appointments are after yours.    Again, thank you for choosing [facility].  Our hope is that these requests will decrease the amount of time that you wait before being seen by our physicians.       _____________________________________________________________  Should you have questions after your visit to [facility], please contact our office at [phone] between the hours of 8:30 a.m. and 4:30 p.m.  Voicemails left after 4:30 p.m. will not be returned until the following business day.  For prescription refill requests, have your pharmacy contact our office with your prescription refill request.     _______________________________________________________________  We hope that we have given you very good care.  You may receive a patient satisfaction survey in the mail, please complete it and return it as soon as possible.  We value your feedback!  _______________________________________________________________  Have you asked about our STAR program?  STAR stands for Survivorship Training and Rehabilitation, and this is a nationally recognized cancer care program that focuses on survivorship and rehabilitation.  Cancer and cancer treatments may cause problems, such as, pain, making you feel tired and keeping you from doing the things that you need or want to do. Cancer rehabilitation can help. Our goal is to reduce these troubling effects and help you have the best quality of life possible.  You may receive a survey from a nurse that asks questions about your current state of health.  Based on the survey results, all eligible patients will be referred to the [facility] program for an evaluation so we can better serve you!  A frequently asked questions sheet is available upon request.

## 2014-01-21 NOTE — Progress Notes (Signed)
STAR Program Physical Impairment and Functional Assessment Screening Tool  1. Are you having any pain, including headaches, joint pain, or muscle pain (upper body = OT; lower body = PT)?   [    ]  No   [ x   ]  Yes, but I hand this before my cancer diagnosis.   [    ]  Yes, this started after my diagnosis and is still a problem.   [    ]  Yes, this is Armanie Ullmer since my last visit. 2. Do your hands and/or feet feel numb or tingle (PT)?   [    ]  No   [x    ]  Yes, but I hand this before my cancer diagnosis.   [    ]  Yes, this started after my diagnosis and is still a problem.   [    ]  Yes, this is Elantra Caprara since my last visit. 3. Does any part of your body feel swollen or larger than usual (upper body = OT; lower body = PT)?   [    ]  No   [x    ]  Yes, but I hand this before my cancer diagnosis.   [    ]  Yes, this started after my diagnosis and is still a problem.   [    ]  Yes, this is Kestrel Mis since my last visit. 4. Are you so tired that you cannot do the things you want or need to do (PT or OT)?   [    ]  No   [ x   ]  Yes, but I hand this before my cancer diagnosis.   [    ]  Yes, this started after my diagnosis and is still a problem.   [    ]  Yes, this is Jermie Hippe since my last visit. 5. Are you feeling weak or are you having trouble moving any part of your body (PT/OT)?   [    ]  No   [x    ]  Yes, but I hand this before my cancer diagnosis.   [    ]  Yes, this started after my diagnosis and is still a problem.   [    ]  Yes, this is Cattie Tineo since my last visit. 6. Are you having trouble concentrating, thinking, or remembering things (OT/ST)?   [    ]  No   [    ]  Yes, but I hand this before my cancer diagnosis.   [   x ]  Yes, this started after my diagnosis and is still a problem.   [    ]  Yes, this is Dayan Kreis since my last visit. 7. Are you having trouble moving around or feel like you might trip or fall (PT)?   [    ]  No   [   x ]  Yes, but I hand this before my cancer diagnosis.   [    ]   Yes, this started after my diagnosis and is still a problem.   [    ]  Yes, this is Artrice Kraker since my last visit. 8. Are you having trouble swallowing (ST)?   [   x ]  No   [    ]  Yes, but I hand this before my cancer diagnosis.   [    ]  Yes, this started after  my diagnosis and is still a problem.   [    ]  Yes, this is Jonne Rote since my last visit. 9. Are you having trouble speaking (ST)?   [    ]  No   [  x ]  Yes, but I hand this before my cancer diagnosis.   [    ]  Yes, this started after my diagnosis and is still a problem.   [    ]  Yes, this is Eulia Hatcher since my last visit. 10. Are you having trouble with going or getting to the bathroom (OT)?   [    ]  No   [ x   ]  Yes, but I hand this before my cancer diagnosis.   [    ]  Yes, this started after my diagnosis and is still a problem.   [    ]  Yes, this is Merari Pion since my last visit. 11. Are you having trouble with your sexual function (OT)?   [    ]  No   [ x   ]  Yes, but I hand this before my cancer diagnosis.   [    ]  Yes, this started after my diagnosis and is still a problem.   [    ]  Yes, this is Dmarco Baldus since my last visit. 12. Are you having trouble lifting things, even just your arms (OT/PT)?   [  x ]  No   [   ]  Yes, but I hand this before my cancer diagnosis.   [    ]  Yes, this started after my diagnosis and is still a problem.   [    ]  Yes, this is Tiffaney Heimann since my last visit. 52. Are you having trouble taking care of yourself as in dressing or bathing (OT)?   [  x  ]  No   [    ]  Yes, but I hand this before my cancer diagnosis.   [    ]  Yes, this started after my diagnosis and is still a problem.   [    ]  Yes, this is Deah Ottaway since my last visit. 14. Are you having trouble with daily tasks like chores or shopping (OT)?   [    ]  No   [  x ]  Yes, but I hand this before my cancer diagnosis.   [    ]  Yes, this started after my diagnosis and is still a problem.   [    ]  Yes, this is Reggie Welge since my last visit. 15. Are you having  trouble driving (OT)?   [  x  ]  No   [    ]  Yes, but I hand this before my cancer diagnosis.   [    ]  Yes, this started after my diagnosis and is still a problem.   [    ]  Yes, this is Jazzlene Huot since my last visit. 35. Are you having trouble returning to work or completing your tasks at work (OT)?   [    ]  No   [   x ]  Yes, but I hand this before my cancer diagnosis.   [    ]  Yes, this started after my diagnosis and is still a problem.   [    ]  Yes, this is Mirenda Baltazar since my last visit.  Other concerns:  Do not work disabled. Do all people's memory go with chemo? Has any other people been told they have a white brain?  Legend: OT = Occupational Therapy PT = Physical Therapy ST = Speech Therapy

## 2014-01-22 NOTE — Progress Notes (Signed)
[facility] OFFICE PROGRESS NOTE  PCP [name], MD [address] [place] [address]  DIAGNOSIS: Lymphocytic lymphoma - Plan: CBC, Basic metabolic panel, Ambulatory referral to Physical Therapy  CURRENT THERAPY: Rituxan IV weekly for cycle #4 today of a planned 4.  HEMATOLOGY/ONCOLOGY HX: In  September of 2004 the patient presented with a several month history of left-sided back pain and was seen in the emergency room at which time a paravertebral left-sided mass at T3 was seen. A core biopsy was consistent with follicular non-Hodgkin's lymphoma with some elements of large cell. PET CT scan revealed mediastinal lymph nodes. Treated with 4 cycles of CHOP-R. started on 03/22/2003 through 05/25/2003 with vincristine dose decreased for the last 2 cycles due to peripheral neuropathy. She received Rituxan maintenance at 2 month intervals for one year and beginning in March of 2006 Rituxan was given at 3 month intervals with treatment ending in December 2006.  She relapsed with a mass at T7 presenting again with back pain at that time treated with Cytoxan and Fludara along with Rituxan starting in July of 2009. Because of myelotoxicity Cytoxan was held for one cycle with repeat study showing no evidence of disease but with persistent thrombocytopenia in the 50-80,000 range. Her paraspinal mass disappeared.. Repeat CT scans on 03/10/2012 showed no evidence of lymphoma.  Sometime in 2009, the patient developed pulmonary lesions and was operated on by Dr. Arlyce Dice at which time Langerhans histiocytosis was diagnosed. Patient was treated with prednisone tapering him 60 mg down to 10 mg a day continued till July of 2013.  PET scan was ordered to restage the patient in 2015 with findings of abnormality in the right upper lobe of the lung as well as the left axilla. Left axilla biopsy was consistent with follicular lymphoma and the patient is currently being  evaluated by thoracic surgery for resection of the pulmonary nodule which appears to be a primary lung cancer. Wedge resection of the right lower lobe nodule performed on 02/03/2777 revealed follicular lymphoma identical to left axillary biopsy performed on 09/28/2013      MEDICAL HISTORY:  Past Medical History  Diagnosis Date  . Coronary artery disease     status post stenting of the aortic and iliac vessels by Dr. Gwenlyn Found. Repeat aortic stenting 2010  . Left ventricular dysfunction     hx of with ejection fraction of 35% range.  . Carotid artery disease     hx of carotid cerebrovascular disease with 0-39% bilateral internal carotid artery setenosis   . Non Hodgkin's lymphoma   . Chronic back pain     status post lumbar surgery  . Hyperlipidemia   . Hypertension   . COPD (chronic obstructive pulmonary disease)   . Shortness of breath   . Cancer     hx of non hogdin lymphoma  . CHF (congestive heart failure)   . Blood transfusion   . Arthritis   . Myocardial infarction   . Peripheral vascular disease   . GERD (gastroesophageal reflux disease)   . Anemia   . Osteoporosis   . Depression   . Anxiety   . Other malignant lymphomas, unspecified site, extranodal and solid organ sites 2004    Back  . Asthma   . Vulvar cancer   . Anginal pain     occ last time last wk  . Dysrhythmia     ?  . Stroke 04  . Pneumonia  hx  . History of kidney stones   . Seizures     ? yrs ago  . Lymphocytic lymphoma 12/24/2013    has HYPERLIPIDEMIA; HYPERTENSION, BENIGN; CAD, NATIVE VESSEL; LEFT VENTRICULAR FUNCTION, DECREASED; CAROTID ARTERY DISEASE; AORTIC ATHEROSCLEROSIS; ATHEROSCLEROSIS, RENAL ARTERY; Atherosclerosis of native arteries of the extremities with rest pain; PVD; COPD; NON-HODGKIN'S LYMPHOMA, HX OF; Vulvar intraepithelial neoplasia III (VIN III); Vulvar cancer; Langerhans cell histiocytosis of lung, 2009; and Lymphocytic lymphoma on her problem list.    ALLERGIES:  has No Known  Allergies.  MEDICATIONS: has a current medication list which includes the following prescription(s): albuterol, alprazolam, amitriptyline, aspirin, bupropion, clopidogrel, cyclosporine, diazepam, furosemide, isosorbide mononitrate, metoprolol, mupirocin ointment, nexium, nitroglycerin, oxycodone, oxycodone, rosuvastatin, and sulfamethoxazole-trimethoprim.  FAMILY HISTORY: family history includes Aortic aneurysm in her sister; Asthma in her son; Coronary artery disease in an other family member; Diabetes in her sister; Endometriosis in her daughter; Heart attack in her brother, father, and mother; Heart disease in her father and mother; Heart failure in her father and mother; Kidney cancer in her father; Lung cancer in her mother; Sleep apnea in her son.  REVIEW OF SYSTEMS:    SINCE YOUR LAST VISIT ECOG Perf Status: Restricted in physically strenuous activity but ambulatory and able to carry out work of a light or sedentary nature, e.g., light house work, office work Pain Assessment Pain Score: 0-No pain  Other than that discussed at the patient's visit on 01/07/2014 is noncontributory. [name] tolerating today's infusion of Rituxan without adverse reactions     PHYSICAL EXAMINATION:  V.S. Charted by the oncology nurse on the patient's infusion flowsheet.   GENERAL: alert, no distress and appears comfortable SKIN: no rashes or significant lesions.  OROPHARYNX: no exudate, no erythema on lips, buccal mucosa, or tongue. NECK: non-tender, without nodularity. No masses LYMPH:  no palpable lymphadenopathy in the cervical, axillary or inguinal LUNGS: clear to auscultation and percussion with distant breath sounds. HEART: regular rate & rhythm and no murmurs.  ABDOMEN: abdomen soft, non-tender and without masses NEURO: alert & oriented x 3 with fluent speech  ABORATORY DATA: Infusion on 01/21/2014  Component Date Value Ref Range Status  . Sed Rate 01/21/2014 38* 0 - 22 mm/hr Final  . LDH  01/21/2014 168  94 - 250 U/L Final  . WBC 01/21/2014 3.8* 4.0 - 10.5 K/uL Final  . RBC 01/21/2014 4.03  3.87 - 5.11 MIL/uL Final  . Hemoglobin 01/21/2014 12.5  12.0 - 15.0 g/dL Final  . HCT 01/21/2014 37.0  36.0 - 46.0 % Final  . MCV 01/21/2014 91.8  78.0 - 100.0 fL Final  . MCH 01/21/2014 31.0  26.0 - 34.0 pg Final  . MCHC 01/21/2014 33.8  30.0 - 36.0 g/dL Final  . RDW 01/21/2014 16.3* 11.5 - 15.5 % Final  . Platelets 01/21/2014 158  150 - 400 K/uL Final  . Neutrophils Relative % 01/21/2014 63  43 - 77 % Final  . Neutro Abs 01/21/2014 2.4  1.7 - 7.7 K/uL Final  . Lymphocytes Relative 01/21/2014 25  12 - 46 % Final  . Lymphs Abs 01/21/2014 0.9  0.7 - 4.0 K/uL Final  . Monocytes Relative 01/21/2014 10  3 - 12 % Final  . Monocytes Absolute 01/21/2014 0.4  0.1 - 1.0 K/uL Final  . Eosinophils Relative 01/21/2014 2  0 - 5 % Final  . Eosinophils Absolute 01/21/2014 0.1  0.0 - 0.7 K/uL Final  . Basophils Relative 01/21/2014 0  0 - 1 % Final  .  Basophils Absolute 01/21/2014 0.0  0.0 - 0.1 K/uL Final  . Sodium 01/21/2014 140  137 - 147 mEq/L Final  . Potassium 01/21/2014 4.2  3.7 - 5.3 mEq/L Final  . Chloride 01/21/2014 99  96 - 112 mEq/L Final  . CO2 01/21/2014 29  19 - 32 mEq/L Final  . Glucose, Bld 01/21/2014 183* 70 - 99 mg/dL Final  . BUN 01/21/2014 12  6 - 23 mg/dL Final  . Creatinine, Ser 01/21/2014 1.40* 0.50 - 1.10 mg/dL Final  . Calcium 01/21/2014 9.5  8.4 - 10.5 mg/dL Final  . Total Protein 01/21/2014 6.8  6.0 - 8.3 g/dL Final  . Albumin 01/21/2014 3.5  3.5 - 5.2 g/dL Final  . AST 01/21/2014 23  0 - 37 U/L Final  . ALT 01/21/2014 17  0 - 35 U/L Final  . Alkaline Phosphatase 01/21/2014 188* 39 - 117 U/L Final  . Total Bilirubin 01/21/2014 0.3  0.3 - 1.2 mg/dL Final  . GFR calc non Af Amer 01/21/2014 41* >90 mL/min Final  . GFR calc Af Amer 01/21/2014 48* >90 mL/min Final   Comment: (NOTE)                          The eGFR has been calculated using the CKD EPI equation.                           This calculation has not been validated in all clinical situations.                          eGFR's persistently <90 mL/min signify possible Chronic Kidney                          Disease.  . Anion gap 01/21/2014 12  5 - 15 Final  Infusion on 01/14/2014  Component Date Value Ref Range Status  . WBC 01/14/2014 4.3  4.0 - 10.5 K/uL Final  . RBC 01/14/2014 3.72* 3.87 - 5.11 MIL/uL Final  . Hemoglobin 01/14/2014 11.2* 12.0 - 15.0 g/dL Final  . HCT 01/14/2014 34.3* 36.0 - 46.0 % Final  . MCV 01/14/2014 92.2  78.0 - 100.0 fL Final  . MCH 01/14/2014 30.1  26.0 - 34.0 pg Final  . MCHC 01/14/2014 32.7  30.0 - 36.0 g/dL Final  . RDW 01/14/2014 16.3* 11.5 - 15.5 % Final  . Platelets 01/14/2014 148* 150 - 400 K/uL Final  . Neutrophils Relative % 01/14/2014 57  43 - 77 % Final  . Neutro Abs 01/14/2014 2.4  1.7 - 7.7 K/uL Final  . Lymphocytes Relative 01/14/2014 27  12 - 46 % Final  . Lymphs Abs 01/14/2014 1.2  0.7 - 4.0 K/uL Final  . Monocytes Relative 01/14/2014 14* 3 - 12 % Final  . Monocytes Absolute 01/14/2014 0.6  0.1 - 1.0 K/uL Final  . Eosinophils Relative 01/14/2014 2  0 - 5 % Final  . Eosinophils Absolute 01/14/2014 0.1  0.0 - 0.7 K/uL Final  . Basophils Relative 01/14/2014 0  0 - 1 % Final  . Basophils Absolute 01/14/2014 0.0  0.0 - 0.1 K/uL Final  . Sodium 01/14/2014 140  137 - 147 mEq/L Final  . Potassium 01/14/2014 3.6* 3.7 - 5.3 mEq/L Final  . Chloride 01/14/2014 100  96 - 112 mEq/L Final  . CO2 01/14/2014 29  19 - 32  mEq/L Final  . Glucose, Bld 01/14/2014 154* 70 - 99 mg/dL Final  . BUN 01/14/2014 11  6 - 23 mg/dL Final  . Creatinine, Ser 01/14/2014 1.54* 0.50 - 1.10 mg/dL Final  . Calcium 01/14/2014 8.8  8.4 - 10.5 mg/dL Final  . Total Protein 01/14/2014 6.2  6.0 - 8.3 g/dL Final  . Albumin 01/14/2014 3.2* 3.5 - 5.2 g/dL Final  . AST 01/14/2014 15  0 - 37 U/L Final  . ALT 01/14/2014 14  0 - 35 U/L Final  . Alkaline Phosphatase 01/14/2014 191* 39 - 117 U/L  Final  . Total Bilirubin 01/14/2014 0.3  0.3 - 1.2 mg/dL Final  . GFR calc non Af Amer 01/14/2014 37* >90 mL/min Final  . GFR calc Af Amer 01/14/2014 42* >90 mL/min Final   Comment: (NOTE)                          The eGFR has been calculated using the CKD EPI equation.                          This calculation has not been validated in all clinical situations.                          eGFR's persistently <90 mL/min signify possible Chronic Kidney                          Disease.  . Anion gap 01/14/2014 11  5 - 15 Final  Infusion on 01/07/2014  Component Date Value Ref Range Status  . WBC 01/07/2014 4.1  4.0 - 10.5 K/uL Final  . RBC 01/07/2014 3.82* 3.87 - 5.11 MIL/uL Final  . Hemoglobin 01/07/2014 11.6* 12.0 - 15.0 g/dL Final  . HCT 01/07/2014 34.7* 36.0 - 46.0 % Final  . MCV 01/07/2014 90.8  78.0 - 100.0 fL Final  . MCH 01/07/2014 30.4  26.0 - 34.0 pg Final  . MCHC 01/07/2014 33.4  30.0 - 36.0 g/dL Final  . RDW 01/07/2014 15.9* 11.5 - 15.5 % Final  . Platelets 01/07/2014 156  150 - 400 K/uL Final  . Neutrophils Relative % 01/07/2014 51  43 - 77 % Final  . Neutro Abs 01/07/2014 2.1  1.7 - 7.7 K/uL Final  . Lymphocytes Relative 01/07/2014 30  12 - 46 % Final  . Lymphs Abs 01/07/2014 1.2  0.7 - 4.0 K/uL Final  . Monocytes Relative 01/07/2014 15* 3 - 12 % Final  . Monocytes Absolute 01/07/2014 0.6  0.1 - 1.0 K/uL Final  . Eosinophils Relative 01/07/2014 4  0 - 5 % Final  . Eosinophils Absolute 01/07/2014 0.1  0.0 - 0.7 K/uL Final  . Basophils Relative 01/07/2014 0  0 - 1 % Final  . Basophils Absolute 01/07/2014 0.0  0.0 - 0.1 K/uL Final  . Sodium 01/07/2014 138  137 - 147 mEq/L Final  . Potassium 01/07/2014 3.8  3.7 - 5.3 mEq/L Final  . Chloride 01/07/2014 96  96 - 112 mEq/L Final  . CO2 01/07/2014 30  19 - 32 mEq/L Final  . Glucose, Bld 01/07/2014 189* 70 - 99 mg/dL Final  . BUN 01/07/2014 13  6 - 23 mg/dL Final  . Creatinine, Ser 01/07/2014 1.54* 0.50 - 1.10 mg/dL Final  .  Calcium 01/07/2014 9.2  8.4 - 10.5 mg/dL Final  . Total Protein  01/07/2014 6.8  6.0 - 8.3 g/dL Final  . Albumin 01/07/2014 3.3* 3.5 - 5.2 g/dL Final  . AST 01/07/2014 18  0 - 37 U/L Final  . ALT 01/07/2014 21  0 - 35 U/L Final  . Alkaline Phosphatase 01/07/2014 225* 39 - 117 U/L Final  . Total Bilirubin 01/07/2014 0.3  0.3 - 1.2 mg/dL Final  . GFR calc non Af Amer 01/07/2014 37* >90 mL/min Final  . GFR calc Af Amer 01/07/2014 42* >90 mL/min Final   Comment: (NOTE)                          The eGFR has been calculated using the CKD EPI equation.                          This calculation has not been validated in all clinical situations.                          eGFR's persistently <90 mL/min signify possible Chronic Kidney                          Disease.  . Anion gap 01/07/2014 12  5 - 15 Final  Infusion on 12/24/2013  Component Date Value Ref Range Status  . Hep B C IgM 12/24/2013 NON REACTIVE  NON REACTIVE Final   Comment: (NOTE)                          High levels of Hepatitis B Core IgM antibody are detectable                          during the acute stage of Hepatitis B. This antibody is used                          to differentiate current from past HBV infection.                          Performed at [facility]  . Hepatitis B Surface Ag 12/24/2013 NEGATIVE  NEGATIVE Final   Performed at [facility]  Appointment on 12/24/2013  Component Date Value Ref Range Status  . WBC 12/24/2013 6.9  4.0 - 10.5 K/uL Final  . RBC 12/24/2013 3.98  3.87 - 5.11 MIL/uL Final  . Hemoglobin 12/24/2013 12.2  12.0 - 15.0 g/dL Final  . HCT 12/24/2013 36.5  36.0 - 46.0 % Final  . MCV 12/24/2013 91.7  78.0 - 100.0 fL Final  . MCH 12/24/2013 30.7  26.0 - 34.0 pg Final  . MCHC 12/24/2013 33.4  30.0 - 36.0 g/dL Final  . RDW 12/24/2013 14.8  11.5 - 15.5 % Final  . Platelets 12/24/2013 139* 150 - 400 K/uL Final  . Neutrophils Relative % 12/24/2013 62  43 - 77 % Final  . Neutro Abs  12/24/2013 4.3  1.7 - 7.7 K/uL Final  . Lymphocytes Relative 12/24/2013 23  12 - 46 % Final  . Lymphs Abs 12/24/2013 1.6  0.7 - 4.0 K/uL Final  . Monocytes Relative 12/24/2013 12  3 - 12 % Final  . Monocytes Absolute 12/24/2013 0.8  0.1 - 1.0 K/uL Final  . Eosinophils Relative 12/24/2013 3  0 -  5 % Final  . Eosinophils Absolute 12/24/2013 0.2  0.0 - 0.7 K/uL Final  . Basophils Relative 12/24/2013 0  0 - 1 % Final  . Basophils Absolute 12/24/2013 0.0  0.0 - 0.1 K/uL Final  . Sodium 12/24/2013 134* 137 - 147 mEq/L Final  . Potassium 12/24/2013 4.5  3.7 - 5.3 mEq/L Final  . Chloride 12/24/2013 92* 96 - 112 mEq/L Final  . CO2 12/24/2013 28  19 - 32 mEq/L Final  . Glucose, Bld 12/24/2013 174* 70 - 99 mg/dL Final  . BUN 12/24/2013 17  6 - 23 mg/dL Final  . Creatinine, Ser 12/24/2013 1.56* 0.50 - 1.10 mg/dL Final  . Calcium 12/24/2013 9.3  8.4 - 10.5 mg/dL Final  . Total Protein 12/24/2013 6.9  6.0 - 8.3 g/dL Final  . Albumin 12/24/2013 3.3* 3.5 - 5.2 g/dL Final  . AST 12/24/2013 19  0 - 37 U/L Final  . ALT 12/24/2013 27  0 - 35 U/L Final  . Alkaline Phosphatase 12/24/2013 189* 39 - 117 U/L Final  . Total Bilirubin 12/24/2013 0.4  0.3 - 1.2 mg/dL Final  . GFR calc non Af Amer 12/24/2013 36* >90 mL/min Final  . GFR calc Af Amer 12/24/2013 42* >90 mL/min Final   Comment: (NOTE)                          The eGFR has been calculated using the CKD EPI equation.                          This calculation has not been validated in all clinical situations.                          eGFR's persistently <90 mL/min signify possible Chronic Kidney                          Disease.  [name] LDH 12/24/2013 183  94 - 250 U/L Final  . Beta-2 Microglobulin 12/24/2013 5.31* <=2.51 mg/L Final   Performed at [facility]  . Retic Ct Pct 12/24/2013 1.7  0.4 - 3.1 % Final  . RBC. 12/24/2013 3.98  3.87 - 5.11 MIL/uL Final  . Retic Count, Manual 12/24/2013 67.7  19.0 - 186.0 K/uL Final     ASSESSMENT: 1.  Recurrent well-differentiated lymphocytic lymphoma involving the left axilla and right upper lobe, status post wedge resection of right upper lobe lesion, tolerated Rituxan well.  2. History of Langerhans histiocytosis of the lung resected in 2009 and treated with prednisone afterwards, no longer on treatment.  3. Chronic obstructive pulmonary disease, continuing to smoke.  4. Peripheral vascular disease, status post bilateral lower extremity stenting, carotid endarterectomy, and history of previous right cerebral thrombosis with minimal to no neurologic deficit.     PLAN:  1. Rituxan intravenously today. 2. Restaging Scans prior to next visit.    2. Maintenance Rituxan every 3 months beginning in October 2015.     The patient knows to call the clinic with any problems, questions or concerns.   Darrall Dears, MD 01/22/2014 7:21 PM

## 2014-01-25 LAB — BETA 2 MICROGLOBULIN, SERUM: BETA 2 MICROGLOBULIN: 4.53 mg/L — AB (ref <=2.51)

## 2014-01-29 ENCOUNTER — Telehealth (HOSPITAL_COMMUNITY): Payer: Self-pay

## 2014-02-03 ENCOUNTER — Other Ambulatory Visit: Payer: Self-pay | Admitting: Cardiovascular Disease

## 2014-03-04 ENCOUNTER — Encounter (HOSPITAL_COMMUNITY): Payer: Medicare Other | Attending: Hematology and Oncology

## 2014-03-04 VITALS — BP 99/68 | HR 73 | Temp 98.0°F | Resp 16

## 2014-03-04 DIAGNOSIS — C8294 Follicular lymphoma, unspecified, lymph nodes of axilla and upper limb: Secondary | ICD-10-CM

## 2014-03-04 DIAGNOSIS — C8589 Other specified types of non-Hodgkin lymphoma, extranodal and solid organ sites: Secondary | ICD-10-CM | POA: Insufficient documentation

## 2014-03-04 DIAGNOSIS — C8599 Non-Hodgkin lymphoma, unspecified, extranodal and solid organ sites: Secondary | ICD-10-CM | POA: Insufficient documentation

## 2014-03-04 DIAGNOSIS — Z452 Encounter for adjustment and management of vascular access device: Secondary | ICD-10-CM

## 2014-03-04 DIAGNOSIS — C8300 Small cell B-cell lymphoma, unspecified site: Secondary | ICD-10-CM

## 2014-03-04 LAB — BASIC METABOLIC PANEL
ANION GAP: 11 (ref 5–15)
BUN: 12 mg/dL (ref 6–23)
CALCIUM: 9 mg/dL (ref 8.4–10.5)
CO2: 28 mEq/L (ref 19–32)
Chloride: 101 mEq/L (ref 96–112)
Creatinine, Ser: 1.4 mg/dL — ABNORMAL HIGH (ref 0.50–1.10)
GFR calc Af Amer: 48 mL/min — ABNORMAL LOW (ref >90)
GFR, EST NON AFRICAN AMERICAN: 41 mL/min — AB (ref >90)
Glucose, Bld: 110 mg/dL — ABNORMAL HIGH (ref 70–99)
Potassium: 4.4 mEq/L (ref 3.7–5.3)
SODIUM: 140 meq/L (ref 137–147)

## 2014-03-04 LAB — CBC
HCT: 37.8 % (ref 36.0–46.0)
Hemoglobin: 12.7 g/dL (ref 12.0–15.0)
MCH: 30.4 pg (ref 26.0–34.0)
MCHC: 33.6 g/dL (ref 30.0–36.0)
MCV: 90.4 fL (ref 78.0–100.0)
PLATELETS: 150 10*3/uL (ref 150–400)
RBC: 4.18 MIL/uL (ref 3.87–5.11)
RDW: 15 % (ref 11.5–15.5)
WBC: 4.8 10*3/uL (ref 4.0–10.5)

## 2014-03-04 MED ORDER — HEPARIN SOD (PORK) LOCK FLUSH 100 UNIT/ML IV SOLN
500.0000 [IU] | Freq: Once | INTRAVENOUS | Status: AC
  Administered 2014-03-04: 500 [IU] via INTRAVENOUS
  Filled 2014-03-04: qty 5

## 2014-03-04 MED ORDER — SODIUM CHLORIDE 0.9 % IJ SOLN
10.0000 mL | INTRAMUSCULAR | Status: DC | PRN
  Administered 2014-03-04: 10 mL via INTRAVENOUS

## 2014-03-04 NOTE — Progress Notes (Signed)
[name] presented for Portacath access and flush.  Proper placement of portacath confirmed by CXR.  Portacath located left  chest wall accessed with  H 20 needle.  Good blood return present. Portacath flushed with 61ml NS and 500U/56ml Heparin and needle removed intact.  Procedure tolerated well and without incident.

## 2014-03-04 NOTE — Patient Instructions (Signed)
[facility] Discharge Instructions  RECOMMENDATIONS MADE BY THE CONSULTANT AND ANY TEST RESULTS WILL BE SENT TO YOUR REFERRING PHYSICIAN.  You had a port flush today with lab work.  Please keep schedule as follows.  If you have any worsening conditions please let us know or go to the ER  Thank you for choosing [facility] to provide your oncology and hematology care.  To afford each patient quality time with our providers, please arrive at least 15 minutes before your scheduled appointment time.  With your help, our goal is to use those 15 minutes to complete the necessary work-up to ensure our physicians have the information they need to help with your evaluation and healthcare recommendations.    Effective January 1st, 2014, we ask that you re-schedule your appointment with our physicians should you arrive 10 or more minutes late for your appointment.  We strive to give you quality time with our providers, and arriving late affects you and other patients whose appointments are after yours.    Again, thank you for choosing [facility].  Our hope is that these requests will decrease the amount of time that you wait before being seen by our physicians.       _____________________________________________________________  Should you have questions after your visit to [facility], please contact our office at [phone] between the hours of 8:30 a.m. and 5:00 p.m.  Voicemails left after 4:30 p.m. will not be returned until the following business day.  For prescription refill requests, have your pharmacy contact our office with your prescription refill request.

## 2014-04-15 ENCOUNTER — Encounter (HOSPITAL_COMMUNITY): Payer: Self-pay

## 2014-04-15 ENCOUNTER — Encounter (HOSPITAL_COMMUNITY): Payer: Medicare Other | Attending: Hematology and Oncology

## 2014-04-15 ENCOUNTER — Encounter (HOSPITAL_BASED_OUTPATIENT_CLINIC_OR_DEPARTMENT_OTHER): Payer: Medicare Other

## 2014-04-15 VITALS — BP 106/53 | HR 61 | Temp 97.6°F | Resp 16

## 2014-04-15 VITALS — BP 129/66 | HR 83 | Temp 97.7°F | Resp 18 | Wt 162.2 lb

## 2014-04-15 DIAGNOSIS — C8300 Small cell B-cell lymphoma, unspecified site: Secondary | ICD-10-CM | POA: Diagnosis not present

## 2014-04-15 DIAGNOSIS — C8290 Follicular lymphoma, unspecified, unspecified site: Secondary | ICD-10-CM

## 2014-04-15 DIAGNOSIS — J449 Chronic obstructive pulmonary disease, unspecified: Secondary | ICD-10-CM

## 2014-04-15 DIAGNOSIS — I739 Peripheral vascular disease, unspecified: Secondary | ICD-10-CM

## 2014-04-15 DIAGNOSIS — C8592 Non-Hodgkin lymphoma, unspecified, intrathoracic lymph nodes: Secondary | ICD-10-CM | POA: Insufficient documentation

## 2014-04-15 DIAGNOSIS — D071 Carcinoma in situ of vulva: Secondary | ICD-10-CM

## 2014-04-15 DIAGNOSIS — C8599 Non-Hodgkin lymphoma, unspecified, extranodal and solid organ sites: Secondary | ICD-10-CM

## 2014-04-15 DIAGNOSIS — Z5112 Encounter for antineoplastic immunotherapy: Secondary | ICD-10-CM

## 2014-04-15 DIAGNOSIS — J8482 Adult pulmonary Langerhans cell histiocytosis: Secondary | ICD-10-CM

## 2014-04-15 DIAGNOSIS — Z720 Tobacco use: Secondary | ICD-10-CM

## 2014-04-15 DIAGNOSIS — Z23 Encounter for immunization: Secondary | ICD-10-CM

## 2014-04-15 DIAGNOSIS — Z8544 Personal history of malignant neoplasm of other female genital organs: Secondary | ICD-10-CM

## 2014-04-15 LAB — COMPREHENSIVE METABOLIC PANEL
ALBUMIN: 3.6 g/dL (ref 3.5–5.2)
ALT: 13 U/L (ref 0–35)
AST: 18 U/L (ref 0–37)
Alkaline Phosphatase: 185 U/L — ABNORMAL HIGH (ref 39–117)
Anion gap: 12 (ref 5–15)
BILIRUBIN TOTAL: 0.2 mg/dL — AB (ref 0.3–1.2)
BUN: 15 mg/dL (ref 6–23)
CALCIUM: 9.6 mg/dL (ref 8.4–10.5)
CHLORIDE: 102 meq/L (ref 96–112)
CO2: 28 meq/L (ref 19–32)
CREATININE: 1.27 mg/dL — AB (ref 0.50–1.10)
GFR calc Af Amer: 54 mL/min — ABNORMAL LOW (ref >90)
GFR, EST NON AFRICAN AMERICAN: 46 mL/min — AB (ref >90)
Glucose, Bld: 118 mg/dL — ABNORMAL HIGH (ref 70–99)
Potassium: 4.2 mEq/L (ref 3.7–5.3)
SODIUM: 142 meq/L (ref 137–147)
Total Protein: 7.4 g/dL (ref 6.0–8.3)

## 2014-04-15 LAB — CBC WITH DIFFERENTIAL/PLATELET
Basophils Absolute: 0 10*3/uL (ref 0.0–0.1)
Basophils Relative: 0 % (ref 0–1)
Eosinophils Absolute: 0.1 10*3/uL (ref 0.0–0.7)
Eosinophils Relative: 3 % (ref 0–5)
HEMATOCRIT: 39.9 % (ref 36.0–46.0)
HEMOGLOBIN: 13.6 g/dL (ref 12.0–15.0)
LYMPHS PCT: 28 % (ref 12–46)
Lymphs Abs: 1.2 10*3/uL (ref 0.7–4.0)
MCH: 30.2 pg (ref 26.0–34.0)
MCHC: 34.1 g/dL (ref 30.0–36.0)
MCV: 88.7 fL (ref 78.0–100.0)
MONO ABS: 0.5 10*3/uL (ref 0.1–1.0)
MONOS PCT: 12 % (ref 3–12)
NEUTROS PCT: 57 % (ref 43–77)
Neutro Abs: 2.4 10*3/uL (ref 1.7–7.7)
Platelets: 152 10*3/uL (ref 150–400)
RBC: 4.5 MIL/uL (ref 3.87–5.11)
RDW: 15.4 % (ref 11.5–15.5)
WBC: 4.3 10*3/uL (ref 4.0–10.5)

## 2014-04-15 LAB — LACTATE DEHYDROGENASE: LDH: 216 U/L (ref 94–250)

## 2014-04-15 LAB — RETICULOCYTES
RBC.: 4.5 MIL/uL (ref 3.87–5.11)
Retic Count, Absolute: 94.5 10*3/uL (ref 19.0–186.0)
Retic Ct Pct: 2.1 % (ref 0.4–3.1)

## 2014-04-15 MED ORDER — SODIUM CHLORIDE 0.9 % IV SOLN
Freq: Once | INTRAVENOUS | Status: AC
  Administered 2014-04-15: 09:00:00 via INTRAVENOUS

## 2014-04-15 MED ORDER — ACETAMINOPHEN 325 MG PO TABS
650.0000 mg | ORAL_TABLET | Freq: Once | ORAL | Status: AC
  Administered 2014-04-15: 650 mg via ORAL
  Filled 2014-04-15: qty 2

## 2014-04-15 MED ORDER — SODIUM CHLORIDE 0.9 % IV SOLN
500.0000 mg/m2 | Freq: Once | INTRAVENOUS | Status: AC
  Administered 2014-04-15: 900 mg via INTRAVENOUS
  Filled 2014-04-15: qty 90

## 2014-04-15 MED ORDER — INFLUENZA VAC SPLIT QUAD 0.5 ML IM SUSY
0.5000 mL | PREFILLED_SYRINGE | Freq: Once | INTRAMUSCULAR | Status: AC
  Administered 2014-04-15: 0.5 mL via INTRAMUSCULAR
  Filled 2014-04-15: qty 0.5

## 2014-04-15 MED ORDER — DIPHENHYDRAMINE HCL 25 MG PO CAPS
50.0000 mg | ORAL_CAPSULE | Freq: Once | ORAL | Status: AC
  Administered 2014-04-15: 50 mg via ORAL
  Filled 2014-04-15: qty 2

## 2014-04-15 MED ORDER — HEPARIN SOD (PORK) LOCK FLUSH 100 UNIT/ML IV SOLN
500.0000 [IU] | Freq: Once | INTRAVENOUS | Status: AC | PRN
  Administered 2014-04-15: 500 [IU]
  Filled 2014-04-15: qty 5

## 2014-04-15 MED ORDER — SODIUM CHLORIDE 0.9 % IJ SOLN
10.0000 mL | INTRAMUSCULAR | Status: DC | PRN
  Administered 2014-04-15: 10 mL

## 2014-04-15 NOTE — Progress Notes (Signed)
[facility]  OFFICE PROGRESS NOTE  [name], MD [address] [place] [address]  DIAGNOSIS: Lymphoma involving lung - Plan: CBC with Differential, Reticulocytes, Comprehensive metabolic panel, Lactate dehydrogenase, Beta 2 microglobulin, serum  Lymphoma malignant, nodular, lymphocytic, left axilla recurrent - Plan: CBC with Differential, Reticulocytes, Comprehensive metabolic panel, Lactate dehydrogenase, Beta 2 microglobuline, serum  Langerhans cell histiocytosis of lung  Vulvar intraepithelial neoplasia III (VIN III)  Chief Complaint  Patient presents with   Lymphoma   Langerhans histiocytosis   Carcinoma of the vulva    CURRENT THERAPY: Cycle 1 of maintenance Rituxan after receiving weekly treatments for 4 weeks from 12/31/2013 through 01/21/2014 for recurrent follicular lymphoma involving the left axilla. She underwent right lower lobe wedge resection of a pulmonary nodule which was consistent with non-Hodgkin's lymphoma. Past history of treated vulvar cancer as well as Langerhans histiocytosis  INTERVAL HISTORY: [name] 58 y.o. female returns for initiation of Rituxan maintenance for recurrent lymphocytic lymphoma involving the left axilla and lung.  Patient continues to smoke about 1 pack of cigarettes daily. She does have chronic cough without expectoration. She denies a sore throat, Of adenopathy, lower extremity swelling or redness but does have some difficulty initiating urination at times. She denies any vaginal bleeding, perineal bleeding, sore throat, nasal drip, skin rash, headache, or seizures. She denies any diarrhea, constipation, melena, hematochezia, hematuria, epistaxis, or hemoptysis.  MEDICAL HISTORY: Past Medical History  Diagnosis Date   Coronary artery disease     status post stenting of the aortic and iliac vessels by Dr. Gwenlyn Found. Repeat aortic stenting 2010   Left ventricular  dysfunction     hx of with ejection fraction of 35% range.   Carotid artery disease     hx of carotid cerebrovascular disease with 0-39% bilateral internal carotid artery setenosis    Non Hodgkin's lymphoma    Chronic back pain     status post lumbar surgery   Hyperlipidemia    Hypertension    COPD (chronic obstructive pulmonary disease)    Shortness of breath    Cancer     hx of non hogdin lymphoma   CHF (congestive heart failure)    Blood transfusion    Arthritis    Myocardial infarction    Peripheral vascular disease    GERD (gastroesophageal reflux disease)    Anemia    Osteoporosis    Depression    Anxiety    Other malignant lymphomas, unspecified site, extranodal and solid organ sites 2004    Back   Asthma    Vulvar cancer    Anginal pain     occ last time last wk   Dysrhythmia     ?   Stroke 04   Pneumonia     hx   History of kidney stones    Seizures     ? yrs ago   Lymphocytic lymphoma 12/24/2013    INTERIM HISTORY: has HYPERLIPIDEMIA; HYPERTENSION, BENIGN; CAD, NATIVE VESSEL; LEFT VENTRICULAR FUNCTION, DECREASED; CAROTID ARTERY DISEASE; AORTIC ATHEROSCLEROSIS; ATHEROSCLEROSIS, RENAL ARTERY; Atherosclerosis of native arteries of the extremities with rest pain; PVD; COPD; NON-HODGKIN'S LYMPHOMA, HX OF; Vulvar intraepithelial neoplasia III (VIN III); Vulvar cancer; Langerhans cell histiocytosis of lung, 2009; and Lymphocytic lymphoma on her problem list.   Vulvar cancer; Langerhans cell histiocytosis of lung, 2009; and Lymphocytic lymphoma on her problem list.  Lymphocytic lymphoma  12/02/2013  Pathology  Interpretation Tissue-Flow Cytometry  MONOCLONAL B CELL POPULATION IDENTIFIED.  12/02/2013  Initial Diagnosis  Diagnosis Lung, wedge biopsy/resection, Right lower lobe - LOW GRADE NON-HODGKIN'S B CELL LYMPHOMA  12/31/2013 -  Chemotherapy  Rituxan weekly x 4 cycles  In September of 2004 the patient presented with a several month history  of left-sided back pain and was seen in the emergency room at which time a paravertebral left-sided mass at T3 was seen. A core biopsy was consistent with follicular non-Hodgkin's lymphoma with some elements of large cell. PET CT scan revealed mediastinal lymph nodes. Treated with 4 cycles of CHOP-R. started on 03/22/2003 through 05/25/2003 with vincristine dose decreased for the last 2 cycles due to peripheral neuropathy. She received Rituxan maintenance at 2 month intervals for one year and beginning in March of 2006 Rituxan was given at 3 month intervals with treatment ending in December 2006.  She relapsed with a mass at T7 presenting again with back pain at that time treated with Cytoxan and Fludara along with Rituxan starting in July of 2009. Because of myelotoxicity Cytoxan was held for one cycle with repeat study showing no evidence of disease but with persistent thrombocytopenia in the 50-80,000 range. Her paraspinal mass disappeared.. Repeat CT scans on 03/10/2012 showed no evidence of lymphoma.  Sometime in 2009, the patient developed pulmonary lesions and was operated on by Dr. Arlyce Dice at which time Langerhans histiocytosis was diagnosed. Patient was treated with prednisone tapering him 60 mg down to 10 mg a day continued till July of 2013.  PET scan was ordered to restage the patient in 2015 with findings of abnormality in the right upper lobe of the lung as well as the left axilla. Left axilla biopsy was consistent with follicular lymphoma and the patient is currently being evaluated by thoracic surgery for resection of the pulmonary nodule which appears to be a primary lung cancer. Wedge resection of the right lower lobe nodule performed on 11/04/3873 revealed follicular lymphoma identical to left axillary biopsy performed on 09/28/2013     ALLERGIES:  has No Known Allergies.  MEDICATIONS: has a current medication list which includes the following prescription(s): albuterol, alprazolam,  amitriptyline, aspirin, bupropion, clopidogrel, cyclosporine, diazepam, furosemide, isosorbide mononitrate, metoprolol, mupirocin ointment, nexium, nitroglycerin, oxycodone, oxycodone, rosuvastatin, and sulfamethoxazole-trimethoprim, and the following Facility-Administered Medications: heparin lock flush and sodium chloride.  SURGICAL HISTORY:  Past Surgical History  Procedure Laterality Date   Lumar surg     Back surgery      x 2   Lung biopsy     Cardiac catheterization     Abdominal aortogram  08/08/2011   Abdominal hysterectomy      partial   Portacath placement      pt says its for chemo only and its an old port(04)    Dilation and curettage of uterus     Vulva /perineum biopsy  04/09/2012    Procedure: VULVAR BIOPSY;  Surgeon: Florian Buff, MD;  Location: [facility];  Service: Gynecology;  Laterality: N/A;   Vulvectomy  05/20/2012    Procedure: VULVECTOMY;  Surgeon: Alvino Chapel, MD;  Location: [facility];  Service: Gynecology;  Laterality: Bilateral;  BILATERAL SIMPLE VULVECTOMIES   Video assisted thoracoscopy (vats)/wedge resection Right 12/02/2013    Procedure: VIDEO ASSISTED THORACOSCOPY (VATS)/WEDGE RESECTION;  Surgeon: Melrose Nakayama, MD;  Location: [facility];  Service: Thoracic;  Laterality: Right;    FAMILY HISTORY: family history includes Aortic aneurysm in her sister; Asthma in her son; Coronary artery disease in an other family  member; Diabetes in her sister; Endometriosis in her daughter; Heart attack in her brother, father, and mother; Heart disease in her father and mother; Heart failure in her father and mother; Kidney cancer in her father; Lung cancer in her mother; Sleep apnea in her son.  SOCIAL HISTORY:  reports that she has been smoking Cigarettes.  She has a 100 pack-year smoking history. She has never used smokeless tobacco. She reports that she does not drink alcohol or use illicit drugs.  REVIEW OF SYSTEMS:  Other than that discussed above is  noncontributory.  PHYSICAL EXAMINATION: ECOG PERFORMANCE STATUS: 1 - Symptomatic but completely ambulatory  Blood pressure 129/66, pulse 83, temperature 97.7 F (36.5 C), temperature source Oral, resp. rate 18, weight 162 lb 3.2 oz (73.573 kg), SpO2 97.00%.  GENERAL:alert, no distress and comfortable SKIN: skin color, texture, turgor are normal, no rashes or significant lesions EYES: PERLA; Conjunctiva are pink and non-injected, sclera clear SINUSES: No redness or tenderness over maxillary or ethmoid sinuses OROPHARYNX:no exudate, no erythema on lips, buccal mucosa, or tongue. Edentulous. NECK: supple, thyroid normal size, non-tender, without nodularity. No masses CHEST: Increased AP diameter with light port in place. LYMPH:  no palpable lymphadenopathy in the cervical, axillary or inguinal LUNGS: clear to auscultation and percussion with normal breathing effort HEART: regular rate & rhythm and no murmurs. ABDOMEN:abdomen soft, non-tender and normal bowel sounds MUSCULOSKELETAL:no cyanosis of digits and no clubbing. Range of motion normal.  NEURO: alert & oriented x 3 with fluent speech, no focal motor/sensory deficits   LABORATORY DATA: Infusion on 04/15/2014  Component Date Value Ref Range Status   WBC 04/15/2014 4.3  4.0 - 10.5 K/uL Final   RBC 04/15/2014 4.50  3.87 - 5.11 MIL/uL Final   Hemoglobin 04/15/2014 13.6  12.0 - 15.0 g/dL Final   HCT 04/15/2014 39.9  36.0 - 46.0 % Final   MCV 04/15/2014 88.7  78.0 - 100.0 fL Final   MCH 04/15/2014 30.2  26.0 - 34.0 pg Final   MCHC 04/15/2014 34.1  30.0 - 36.0 g/dL Final   RDW 04/15/2014 15.4  11.5 - 15.5 % Final   Platelets 04/15/2014 152  150 - 400 K/uL Final   Neutrophils Relative % 04/15/2014 57  43 - 77 % Final   Neutro Abs 04/15/2014 2.4  1.7 - 7.7 K/uL Final   Lymphocytes Relative 04/15/2014 28  12 - 46 % Final   Lymphs Abs 04/15/2014 1.2  0.7 - 4.0 K/uL Final   Monocytes Relative 04/15/2014 12  3 - 12 % Final     Monocytes Absolute 04/15/2014 0.5  0.1 - 1.0 K/uL Final   Eosinophils Relative 04/15/2014 3  0 - 5 % Final   Eosinophils Absolute 04/15/2014 0.1  0.0 - 0.7 K/uL Final   Basophils Relative 04/15/2014 0  0 - 1 % Final   Basophils Absolute 04/15/2014 0.0  0.0 - 0.1 K/uL Final   Retic Ct Pct 04/15/2014 2.1  0.4 - 3.1 % Final   RBC. 04/15/2014 4.50  3.87 - 5.11 MIL/uL Final   Retic Count, Manual 04/15/2014 94.5  19.0 - 186.0 K/uL Final    PATHOLOGY:  for RHEAGAN, NAYAK ([id number]) Patient: Shan Levans Collected: 09/28/2013 Client: [facility] Accession: [id number] Received: 09/28/2013 Art Hoss DOB: 1956-06-05 Age: 48 Gender: F Reported: 09/30/2013 [address] Patient Ph: [phone] MRN #: [id number] [place], [place] [address] Visit #: [id number] Chart #: Phone:  Fax: CC: [name], MD REPORT OF SURGICAL PATHOLOGY  FINAL DIAGNOSIS Diagnosis Lymph node, needle/core biopsy, LN - FOLLICULAR LYMPHOMA, LOW GRADE (GRADE 1-2 OF 3). - SEE ONCOLOGY TABLE. Diagnosis Note LYMPHOMA Histologic type: Follicular lymphoma. Grade (if applicable): Low grade (grade 1-2 of 3). Flow cytometry: [id number]. Monoclonal population of B-cell with expression of CD10. Immunohistochemical stains: CD20, CD3, CD10, CD5, bcl-6, bcl-2 and Ki-67. Touch preps/imprints: N/A. Comments: The needle cores reveal lymphoid tissue with a vague nodular architecture. The nodules are composed of small lymphocytes with irregular nuclear contours. Only rare large centroblast like cells are seen, consistent with a low grade process. Immunohistochemistry reveals the ill-defined nodules of atypical lymphocytes which are positive for CD20, CD10, bcl-6, and bcl-2. Ki-67 reveals a low proliferation rate (10%) in the atypical areas. CD3 and CD5 highlight admixed T-cells. Flow cytometry ([id number]) reveals a monoclonal B-cell population with expression of CD10. Overall, these findings are  consistent with a low grade follicular lymphoma. [name] was called on 09/30/2013. Vicente Males MD Pathologist, Electronic Signature (Case signed 09/30/2013) Specimen Gross and Clinical Information Specimen(s) Obtained: Lymph node, needle/core biopsy, LN Specimen Clinical Information (tl) 1 of 2 FINAL for MARYANNA, STUBER [phone]) Gross In saline are three cores of tan soft tissue ranging from 0.6 x 0.1 cm to 1 x 0.1 cm. Half of one core is held in RPMI for possible flow cytometry, with the remaining specimen submitted in one block for routine histology. (SSW:ecj 09/28/2013) Stain(s) used in Diagnosis: The following stain(s) were used in diagnosing the case: BCl 6 , CD 3, KI-67-NO ACIS, CD 20, CD-10, CD 5, BCl 2. The control(s) stained appropriately. Disclaimer Some of these immunohistochemical stains may have been developed and the performance characteristics determined by [facility]. Some may not have been cleared or approved by the U.S. Food and Drug Administration. The FDA has determined that such clearance or approval is not necessary. This test is used for clinical purposes. It should not be regarded as investigational or for research. This laboratory is certified under the [place] (CLIA-88) as qualified to perform high complexity clinical laboratory testing. Report signed out from the following location(s) Technical Component performed at [facility] [address] [place], [place], [place] [address]. CLIA #: [id number], Technical Component performed at [place], [place], [place] [address]. CLIA #: [id number], Interpretation performed at [place], [place], [place] [address]. CLIA #: [id number],    FINAL for BRAYLEN, DENUNZIO ([id number]) Patient: REMMINGTON, TETERS Collected: 09/28/2013 Client: [place] Accession: [id number]  Received: 09/28/2013 Art Hoss DOB: 1956-04-18 Age: 74 Gender: F Reported: 09/30/2013 [address] Patient Ph: [phone] MRN #: [id number] [place], [place] [address] Visit #: [id number] Chart #: Phone:  Fax: CC: Sinda Du FLOW CYTOMETRY REPORT INTERPRETATION Interpretation Tissue-Flow Cytometry - MONOCLONAL B-CELL POPULATION IDENTIFIED, SEE COMMENT. Diagnosis Comment: There is monoclonal B-cell population with expression of CD10. This correlates with the findings seen in the lymph node ([id number]). Vicente Males MD Pathologist, Electronic Signature (Case signed 09/30/2013) GROSS AND MICROSCOPIC INFORMATION Source Tissue-Flow Cytometry Microscopic Gated population: Flow cytometric immunophenotyping is performed using antibiodies to the antigens listed in the table below. Electronic gates are placed around a cell cluster displaying light scatter properties corresponding to lymphocytes. - Abnormal Cells in gated population: 67 % - Phenotype of Abnormal Cells: CD10, CD19, CD20, CD21, CD22, CD23, HLA-Dr, Lambda 1 of 2 FINAL for EUFEMIA, PRINDLE [phone]) Specimen Table Lymphoid Associated Myeloid Associated Misc. As CD2 neg  CD19 pos CD11c ND CD45 ND CD3 neg CD20 pos CD13 ND HLA-DR pos CD4 neg CD21 pos CD14 ND CD10 pos CD5 neg CD22 pos CD15 ND CD56/16 ND CD7 neg CD23 pos CD33 ND ZAP70 ND CD8 neg CD103 ND MPO ND CD34 ND CD25 ND FMC7 ND CD117 ND CD52 ND sKappa neg CD38 ND sLambda pos 7AAD tested cKappa ND cLambda ND Gross ref. [id number] All controls stained appropriately. The above tests were developed and their performance characteristics determined by the [facility]. They have not been cleared or approved by the U.S. Food and Drug Administration." Report signed from the following location(s) Interpretation performed at [place], [place], [place] [address]. CLIA #: [id number],    for YARDEN, MANUELITO  ([id number]) Patient: ADVIKA, MCLELLAND Collected: 12/02/2013 Client: [facility] Accession: [id number] Received: 12/02/2013 Mod[place] Charon DOB: 12/23/1955 Age: 37 Gender: F Reported: 12/04/2013 [address] Patient Ph: [phone] MRN #: [id number] [place], [place] [address] Visit #: [id number] Chart #: Phone:  Fax: CC: REPORT OF SURGICAL PATHOLOGY FINAL DIAGNOSIS Diagnosis Lung, wedge biopsy/resection, Right lower lobe - LOW GRADE NON-HODGKIN'S B CELL LYMPHOMA - SEE ONCOLOGY TABLE. Microscopic Comment LYMPHOMA Histologic type: Non-Hodgkin's lymphoma, follicle center cell type Grade (if applicable): Low grade Flow cytometry: Monoclonal, lambda restricted B cell population expressing pan B cell antigens including CD20 with associated expression of CD10 ([id number]). Immunohistochemical stains: Not performed. Touch preps/imprints: Not performed. Comments: The sections show a dense nodular and diffuse lymphoproliferative process characterized by predominance of small round to angulated/irregular lymphoid cells with dense chromatin and small to inconspicuous nucleoli associated with scattered mitosis. This is admixed with a minor component of large lymphoid cells consisting of scattered cells. The lymphoid process appears to show a relatively prominent perivascular and subpleural distribution with expansion into the surrounding lung parenchyma. No necrosis is seen. Flow cytometric analysis was performed and shows a monoclonal B cell population expressing pan B cell antigens including CD20 in association with CD10 expression. The overall features are consistent with involvement by low grade non-Hodgkin's B cell lymphoma, follicle center cell type. (BNS:kh 12-04-13) Susanne Greenhouse MD Pathologist, Electronic Signature (Case signed 12/04/2013) Intraoperative Diagnosis RIGHT LOWER LOBE WEDGE RESECTION, FROZEN SECTION DIAGNOSIS: LYMPHOID PROCESS. LYMPHOMA WORK-UP DONE  (JBK). Specimen Gross and Clinical Information Specimen(s) Obtained: Lung, wedge biopsy/resection, Right lower lobe 1 of 2 FINAL for ELANA, JIAN [phone]) Specimen Clinical Information Right lower lobe (tl) Gross Received fresh for rapid intraoperative consult is a 4 gram, 3.8 x 1.7 x 1.2 cm wedge resection of lung. The pleura is focally disrupted. This area is marked with black ink. The cut surface shows a 1 x 0.8 x 0.8 cm firm white nodule. The nodule extends to within 0.2 cm of the stapled margin. The cut surface at the stapled margin is marked with yellow ink. The remainder of the parenchyma is spongy red brown. A section is submitted for frozen section and a portion of the specimen is submitted in RPMI for flow cytometry. Touch preparations are made form cut surfaces of the nodule. Additional sections are submitted in two cassettes to include the entire nodule. (GP:gt, 12/02/13) Report signed out from the following location(s) Technical Component performed at [facility]. [address],[place] [address].[facility]:[id number],[id number]., Interpretation performed at [place], [place], [place] [address]. CLIA #: [id number],    Urinalysis    Component Value Date/Time   COLORURINE YELLOW 11/30/2013 [place]  11/30/2013 1330   LABSPEC 1.012 11/30/2013 1330   PHURINE 6.0 11/30/2013 1330   GLUCOSEU NEGATIVE 11/30/2013 1330   HGBUR NEGATIVE 11/30/2013 1330   BILIRUBINUR NEGATIVE 11/30/2013 1330   KETONESUR NEGATIVE 11/30/2013 1330   PROTEINUR NEGATIVE 11/30/2013 1330   UROBILINOGEN 1.0 11/30/2013 1330   NITRITE NEGATIVE 11/30/2013 1330   LEUKOCYTESUR NEGATIVE 11/30/2013 1330    RADIOGRAPHIC STUDIES: No results found.  ASSESSMENT:  1. Recurrent well-differentiated lymphocytic lymphoma involving the left axilla and right upper lobe, status post wedge resection of right upper lobe lesion, tolerated Rituxan well, to begin  maintenance Rituxan every 3 months for 2 years on 04/15/2014. 2. History of Langerhans histiocytosis of the lung resected in 2009 and treated with prednisone afterwards, no longer on treatment.  3. Chronic obstructive pulmonary disease, continuing to smoke.  4. Peripheral vascular disease, status post bilateral lower extremity stenting, carotid endarterectomy, and history of previous right cerebral thrombosis with minimal to no neurologic deficit.  5. Vulvar cancer, status post resection with no evidence of recurrence.      PLAN:  #1. IV Rituxan 500 mg per meter squared to be repeated every 3 months for 2 years. #2. Influenza virus vaccine today. #3. Followup in 3 months with CBC, chem profile, reticulocyte count, LDH, and beta-2 microglobulin along with second cycle of Rituxan.    All questions were answered. The patient knows to call the clinic with any problems, questions or concerns. We can certainly see the patient much sooner if necessary.   I spent 25 minutes counseling the patient face to face. The total time spent in the appointment was 30 minutes.    Doroteo Bradford, MD 04/15/2014 9:10 AM  DISCLAIMER:  This note was dictated with voice recognition software.  Similar sounding words can inadvertently be transcribed inaccurately and may not be corrected upon review.

## 2014-04-15 NOTE — Patient Instructions (Signed)
[facility] Discharge Instructions  RECOMMENDATIONS MADE BY THE CONSULTANT AND ANY TEST RESULTS WILL BE SENT TO YOUR REFERRING PHYSICIAN.  EXAM FINDINGS BY THE PHYSICIAN TODAY AND SIGNS OR SYMPTOMS TO REPORT TO CLINIC OR PRIMARY PHYSICIAN: You saw Dr Barnet [facility] today  Maintence Rituxan in 3 months with lab work and a doctor visit.  You received the flu vaccine today.  Please call the clinic if you have any questions or concerns Thank you for choosing [facility] to provide your oncology and hematology care.  To afford each patient quality time with our providers, please arrive at least 15 minutes before your scheduled appointment time.  With your help, our goal is to use those 15 minutes to complete the necessary work-up to ensure our physicians have the information they need to help with your evaluation and healthcare recommendations.    Effective January 1st, 2014, we ask that you re-schedule your appointment with our physicians should you arrive 10 or more minutes late for your appointment.  We strive to give you quality time with our providers, and arriving late affects you and other patients whose appointments are after yours.    Again, thank you for choosing [facility].  Our hope is that these requests will decrease the amount of time that you wait before being seen by our physicians.       _____________________________________________________________  Should you have questions after your visit to [facility], please contact our office at [phone] between the hours of 8:30 a.m. and 5:00 p.m.  Voicemails left after 4:30 p.m. will not be returned until the following business day.  For prescription refill requests, have your pharmacy contact our office with your prescription refill request.

## 2014-04-15 NOTE — Progress Notes (Signed)
Tolerated well

## 2014-04-15 NOTE — Progress Notes (Signed)
[name] Received flu shot today

## 2014-04-19 LAB — BETA 2 MICROGLOBULIN, SERUM: Beta-2 Microglobulin: 3.95 mg/L — ABNORMAL HIGH (ref <=2.51)

## 2014-05-03 ENCOUNTER — Other Ambulatory Visit: Payer: Self-pay | Admitting: Cardiovascular Disease

## 2014-06-01 ENCOUNTER — Telehealth (HOSPITAL_COMMUNITY): Payer: Self-pay

## 2014-06-01 NOTE — Telephone Encounter (Signed)
Call from patient stating "I have a spot on top of my head near the part and where a baby has a soft spot that feels mushy.  Just happened to feel it and wondered if the rituxan could be causing it."  Denies headaches, falls, etc. Instructed that she should see her PCP for evaluation.

## 2014-06-03 ENCOUNTER — Ambulatory Visit (INDEPENDENT_AMBULATORY_CARE_PROVIDER_SITE_OTHER): Payer: Medicare Other | Admitting: Cardiovascular Disease

## 2014-06-03 ENCOUNTER — Encounter: Payer: Self-pay | Admitting: Cardiovascular Disease

## 2014-06-03 VITALS — BP 115/62 | HR 69 | Ht 64.5 in | Wt 174.1 lb

## 2014-06-03 DIAGNOSIS — I700 Atherosclerosis of aorta: Secondary | ICD-10-CM

## 2014-06-03 DIAGNOSIS — E785 Hyperlipidemia, unspecified: Secondary | ICD-10-CM

## 2014-06-03 DIAGNOSIS — I10 Essential (primary) hypertension: Secondary | ICD-10-CM

## 2014-06-03 DIAGNOSIS — I739 Peripheral vascular disease, unspecified: Secondary | ICD-10-CM

## 2014-06-03 DIAGNOSIS — I70229 Atherosclerosis of native arteries of extremities with rest pain, unspecified extremity: Secondary | ICD-10-CM

## 2014-06-03 NOTE — Patient Instructions (Addendum)
Your physician has recommended an aorto-iliac duplex.  Your physician has requested that you have a lower extremity arterial duplex. This test is an ultrasound of the arteries in the legs. It looks at arterial blood flow in the legs. Allow one hour for Lower Arterial scans. There are no restrictions or special instructions.  Your physician recommends that you continue on your current medications as directed. Please refer to the Current Medication list given to you today.  Your physician wants you to follow-up in: 1 YEAR with Dr Burt Knack.  You will receive a reminder letter in the mail two months in advance. If you don't receive a letter, please call our office to schedule the follow-up appointment.

## 2014-06-03 NOTE — Progress Notes (Signed)
Background: The patient has been followed for severe peripheral arterial disease. She was treated with bilateral iliac stents in February 2013. She had critical limb ischemia on the right. She has been a long-term smoker. She also has coronary artery disease with chronic occlusion of the RCA and left to right collaterals.  She has also had non-Hodgkin's lymphoma.  In 2015 she had a new lung lesion and underwent wedge resection that had pathology consistent with her known non-Hodgkin's lymphoma.  A preoperative nuclear scan demonstrated inferior wall scar consistent with her known coronary anatomy. There is no significant ischemia.  HPI:   24 six-year-old woman presenting for follow-up evaluation. She's had no recent chest pain. Chronic dyspnea is unchanged.  Reports some limitation by right calf pain but remains better than it's been i nthe past. No rest pain or ischemic ulceration. She does have bilateral calf discomfort when she walks longer distances. Continues to smoke cigarettes. No bleeding problems on ASA and plavix.   Studies:  Myoview Scan 11/18/13: Impression Exercise Capacity: Lexiscan with no exercise. BP Response: Normal blood pressure response. Clinical Symptoms: No significant symptoms noted. ECG Impression: No significant ECG changes with Lexiscan. Comparison with Prior Nuclear Study: the 2003 study described normal perfusion and LVEF  Overall Impression: Low risk stress nuclear study with an inferior wall scar. It is difficult to completely exclude reversible ischemia in the inferior wall due to splanchnic uptake interference, but the majority of the defect appears fixed.  LV Ejection Fraction: 47%. LV Wall Motion: inferior wall hypokinesis and mildly reduced overall systolic function.  ABI's 06/18/2013: 0.92 bilaterally  Outpatient Encounter Prescriptions as of 06/03/2014  Medication Sig  . albuterol (VENTOLIN HFA) 108 (90 BASE) MCG/ACT inhaler Inhale 2 puffs into the  lungs every 6 (six) hours as needed. For asthma/wheezing  . ALPRAZolam (XANAX) 1 MG tablet Take 1 mg by mouth 3 (three) times daily.   [name] amitriptyline (ELAVIL) 50 MG tablet Take 50 mg by mouth at bedtime.  [name] aspirin 81 MG EC tablet Take 81 mg by mouth daily.    [name] buPROPion (WELLBUTRIN XL) 150 MG 24 hr tablet Take 150 mg by mouth daily.  . clopidogrel (PLAVIX) 75 MG tablet TAKE 1 TABLET DAILY WITH BREAKFAST.  . cycloSPORINE (RESTASIS) 0.05 % ophthalmic emulsion Place 1 drop into both eyes 2 (two) times daily.  . diazepam (VALIUM) 5 MG tablet Take 5 mg by mouth every 6 (six) hours as needed. For muscle spasms  . furosemide (LASIX) 40 MG tablet Take 40 mg by mouth daily as needed for fluid.   . isosorbide mononitrate (IMDUR) 30 MG 24 hr tablet Take 0.5 tablets (15 mg total) by mouth daily.  . metoprolol (LOPRESSOR) 50 MG tablet TAKE (1/2) TABLET BY MOUTH TWICE DAILY.  . mupirocin ointment (BACTROBAN) 2 % Apply 1 application topically daily. As needed for leg infection.  [name] NEXIUM 40 MG capsule Take 40 mg by mouth Twice daily.  . nitroGLYCERIN (NITROSTAT) 0.4 MG SL tablet Place 0.4 mg under the tongue every 5 (five) minutes as needed for chest pain.  [name] oxyCODONE (OXYCONTIN) 80 MG 12 hr tablet Take 80 mg by mouth 4 (four) times daily.  [name] oxycodone (ROXICODONE) 30 MG immediate release tablet Take 30 mg by mouth every 4 (four) hours as needed. For breakthrough pain  . rosuvastatin (CRESTOR) 40 MG tablet Take 40 mg by mouth daily.    [name] sulfamethoxazole-trimethoprim (BACTRIM DS) 800-160 MG per tablet Take 1 tablet by mouth 2 (two)  times a week. Takes 1 tablet twice a day on Saturday and Sunday only.  . SYMBICORT 160-4.5 MCG/ACT inhaler Inhale 2 puffs into the lungs every 12 (twelve) hours.     No Known Allergies  Past Medical History  Diagnosis Date  . Coronary artery disease     status post stenting of the aortic and iliac vessels by Dr. Gwenlyn Found. Repeat aortic stenting 2010  . Left ventricular  dysfunction     hx of with ejection fraction of 35% range.  . Carotid artery disease     hx of carotid cerebrovascular disease with 0-39% bilateral internal carotid artery setenosis   . Non Hodgkin's lymphoma   . Chronic back pain     status post lumbar surgery  . Hyperlipidemia   . Hypertension   . COPD (chronic obstructive pulmonary disease)   . Shortness of breath   . Cancer     hx of non hogdin lymphoma  . CHF (congestive heart failure)   . Blood transfusion   . Arthritis   . Myocardial infarction   . Peripheral vascular disease   . GERD (gastroesophageal reflux disease)   . Anemia   . Osteoporosis   . Depression   . Anxiety   . Other malignant lymphomas, unspecified site, extranodal and solid organ sites 2004    Back  . Asthma   . Vulvar cancer   . Anginal pain     occ last time last wk  . Dysrhythmia     ?  . Stroke 04  . Pneumonia     hx  . History of kidney stones   . Seizures     ? yrs ago  . Lymphocytic lymphoma 12/24/2013    family history includes Aortic aneurysm in her sister; Asthma in her son; Coronary artery disease in an other family member; Diabetes in her sister; Endometriosis in her daughter; Heart attack in her brother, father, and mother; Heart disease in her father and mother; Heart failure in her father and mother; Kidney cancer in her father; Lung cancer in her mother; Sleep apnea in her son.   ROS: Negative except as per HPI  BP 115/62 mmHg  Pulse 69  Ht 5' 4.5" (1.638 m)  Wt 174 lb 1.9 oz (78.98 kg)  BMI 29.44 kg/m2  PHYSICAL EXAM: Pt is alert and oriented, NAD HEENT: normal Neck: JVP - normal, carotids 2+= with bilateral bruits Lungs: CTA bilaterally CV: RRR without murmur or gallop Abd: soft, NT, Positive BS, obese Ext: 1+ edema on the right, none on the left Skin: warm/dry no rash  EKG:   Normal sinus rhythm 69 bpm , prolonged QT , borderline.  ASSESSMENT AND PLAN: 1. CAD, native vessel, without angina: low-risk Myoview  consistent with her known RCA occlusion. Continue current medical therapy. Needs to stop smoking but long hx of cigarettes and has never been able to stop.  2. Lower extremity PAD with intermittent claudication. Needs ABI's/duplex scan for follow-up. Has undergone complex iliac stenting in the past.  3. Tobacco abuse: cessation counseling done.   4. Hyperlipidemia: on high-dose Crestor. Will continue the same.   Sherren Mocha, MD 06/03/2014 3:53 PM

## 2014-06-07 ENCOUNTER — Other Ambulatory Visit (HOSPITAL_COMMUNITY): Payer: Self-pay | Admitting: Cardiology

## 2014-06-07 DIAGNOSIS — I739 Peripheral vascular disease, unspecified: Secondary | ICD-10-CM

## 2014-06-10 ENCOUNTER — Encounter (HOSPITAL_COMMUNITY): Payer: Self-pay | Admitting: Cardiovascular Disease

## 2014-06-17 ENCOUNTER — Encounter (HOSPITAL_COMMUNITY): Payer: Medicare Other

## 2014-06-21 NOTE — Progress Notes (Signed)
Marlana Latus, RN           Patient was schedule for aorta/aterial on 06-17-14 - cancel -Patient (pt stated she is having problems with ins, will call back to resch; ah/will defer x 2 months/saf

## 2014-06-30 ENCOUNTER — Other Ambulatory Visit: Payer: Self-pay | Admitting: Cardiovascular Disease

## 2014-07-08 ENCOUNTER — Inpatient Hospital Stay (HOSPITAL_COMMUNITY): Payer: Medicare Other

## 2014-07-16 ENCOUNTER — Encounter (HOSPITAL_COMMUNITY): Payer: Self-pay | Admitting: Hematology & Oncology

## 2014-07-16 ENCOUNTER — Ambulatory Visit (HOSPITAL_COMMUNITY): Payer: Medicare Other | Admitting: Hematology & Oncology

## 2014-07-16 ENCOUNTER — Inpatient Hospital Stay (HOSPITAL_COMMUNITY): Payer: Medicare Other

## 2014-07-19 ENCOUNTER — Encounter (HOSPITAL_COMMUNITY): Payer: Self-pay | Admitting: Oncology

## 2014-07-19 NOTE — Progress Notes (Signed)
-  Rescheduled-  Cymone Yeske  

## 2014-07-19 NOTE — Assessment & Plan Note (Signed)
S/P 4 weekly infusions of Rituxan finishing on 01/21/2014 followed by maintenance Rituxan.

## 2014-07-22 ENCOUNTER — Ambulatory Visit (HOSPITAL_COMMUNITY): Payer: Self-pay | Admitting: Oncology

## 2014-07-22 ENCOUNTER — Inpatient Hospital Stay (HOSPITAL_COMMUNITY): Payer: Self-pay

## 2014-07-22 ENCOUNTER — Encounter (HOSPITAL_COMMUNITY): Payer: Self-pay | Admitting: Cardiovascular Disease

## 2014-07-23 ENCOUNTER — Ambulatory Visit (HOSPITAL_COMMUNITY): Payer: Self-pay | Admitting: Oncology

## 2014-07-23 ENCOUNTER — Inpatient Hospital Stay (HOSPITAL_COMMUNITY): Payer: Self-pay

## 2014-07-25 NOTE — Progress Notes (Signed)
[name], MD [place] [address]  Non-Hodgkin lymphoma, B-cell, low grade - Plan: NM PET Image Restag (PS) Skull Base To Thigh  CURRENT THERAPY: Maintenance Rituxan  INTERVAL HISTORY: TAYLEIGH WETHERELL 59 y.o. 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      Non-Hodgkin lymphoma, B-cell, low grade   12/02/2013 Pathology Results Interpretation Tissue-Flow Cytometry MONOCLONAL B CELL POPULATION IDENTIFIED.   12/02/2013 Initial Diagnosis Diagnosis Lung, wedge biopsy/resection, Right lower lobe - LOW GRADE NON-HODGKIN'S B CELL LYMPHOMA   12/31/2013 - 01/21/2014 Chemotherapy Rituxan weekly x 4 cycles   04/15/2014 -  Chemotherapy Maintenance Rituxan x 2 years   I personally reviewed and went over laboratory results with the patient.  The results are noted within this dictation.  She notes "right armpit and breast bumps" x 2-3 months.  She notes that they are tender at times.  She denies any B symptoms.  I have a low suspicion that this is recurrence, but given her history, it is better to be safe than to be sorry.  [name] agreeable to this.  She will get treated today as planned however. There is data that Rituxan every 60 days is better than every 90 days.  Therefore, I will change this in her antibody plan.  [name] agreeable to this idea.    She notes a URI that is being treated by [name].  She reports that she is starting her second course of antibiotics today.  Otherwise, she denies any complaints and ROS questioning is negative.   Past Medical History  Diagnosis Date  . Coronary artery disease     status post stenting of the aortic and iliac vessels by Dr. Gwenlyn Found. Repeat aortic stenting 2010  . Left ventricular dysfunction     hx of with ejection fraction of 35% range.  . Carotid artery disease     hx of carotid cerebrovascular disease with 0-39% bilateral internal carotid artery  setenosis   . Non Hodgkin's lymphoma   . Chronic back pain     status post lumbar surgery  . Hyperlipidemia   . Hypertension   . COPD (chronic obstructive pulmonary disease)   . Shortness of breath   . Cancer     hx of non hogdin lymphoma  . CHF (congestive heart failure)   . Blood transfusion   . Arthritis   . Myocardial infarction   . Peripheral vascular disease   . GERD (gastroesophageal reflux disease)   . Anemia   . Osteoporosis   . Depression   . Anxiety   . Other malignant lymphomas, unspecified site, extranodal and solid organ sites 2004    Back  . Asthma   . Vulvar cancer   . Anginal pain     occ last time last wk  . Dysrhythmia     ?  . Stroke 04  . Pneumonia     hx  . History of kidney stones   . Seizures     ? yrs ago  . Lymphocytic lymphoma 12/24/2013  . Non-Hodgkin lymphoma, B-cell, low grade 12/24/2013    has HYPERLIPIDEMIA; HYPERTENSION, BENIGN; CAD, NATIVE VESSEL; LEFT VENTRICULAR FUNCTION, DECREASED; CAROTID ARTERY DISEASE; AORTIC ATHEROSCLEROSIS; ATHEROSCLEROSIS, RENAL ARTERY; Atherosclerosis of native arteries of the extremities with rest pain; PVD; COPD; NON-HODGKIN'S LYMPHOMA, HX OF; Vulvar intraepithelial neoplasia III (VIN III); Vulvar cancer; Langerhans cell histiocytosis of lung, 2009; and Non-Hodgkin  lymphoma, B-cell, low grade on her problem list.     has No Known Allergies.  Ms. Beining does not currently have medications on file.  Past Surgical History  Procedure Laterality Date  . Lumar surg    . Back surgery      x 2  . Lung biopsy    . Cardiac catheterization    . Abdominal aortogram  08/08/2011  . Abdominal hysterectomy      partial  . Portacath placement      pt says its for chemo only and its an old port(04)   . Dilation and curettage of uterus    . Vulva /perineum biopsy  04/09/2012    Procedure: VULVAR BIOPSY;  Surgeon: Florian Buff, MD;  Location: [facility];  Service: Gynecology;  Laterality: N/A;  . Vulvectomy  05/20/2012     Procedure: VULVECTOMY;  Surgeon: Alvino Chapel, MD;  Location: [facility];  Service: Gynecology;  Laterality: Bilateral;  BILATERAL SIMPLE VULVECTOMIES  . Video assisted thoracoscopy (vats)/wedge resection Right 12/02/2013    Procedure: VIDEO ASSISTED THORACOSCOPY (VATS)/WEDGE RESECTION;  Surgeon: Melrose Nakayama, MD;  Location: [facility];  Service: Thoracic;  Laterality: Right;  . Abdominal aortagram N/A 08/08/2011    Procedure: ABDOMINAL Maxcine Ham;  Surgeon: Sherren Mocha, MD;  Location: [facility] CATH LAB;  Service: Cardiovascular;  Laterality: N/A;  . Percutaneous stent intervention Bilateral 08/08/2011    Procedure: PERCUTANEOUS STENT INTERVENTION;  Surgeon: Sherren Mocha, MD;  Location: [facility] CATH LAB;  Service: Cardiovascular;  Laterality: Bilateral;    Denies any headaches, dizziness, double vision, fevers, chills, night sweats, nausea, vomiting, diarrhea, constipation, chest pain, heart palpitations, shortness of breath, blood in stool, black tarry stool, urinary pain, urinary burning, urinary frequency, hematuria.   PHYSICAL EXAMINATION  ECOG PERFORMANCE STATUS: 0 - Asymptomatic  There were no vitals filed for this visit.  GENERAL:alert, no distress, well nourished, well developed, comfortable, cooperative and smiling SKIN: skin color, texture, turgor are normal, no rashes or significant lesions HEAD: Normocephalic, No masses, lesions, tenderness or abnormalities EYES: normal, PERRLA, EOMI, Conjunctiva are pink and non-injected EARS: External ears normal OROPHARYNX:mucous membranes are moist  NECK: supple, no adenopathy, thyroid normal size, non-tender, without nodularity, no stridor, non-tender, trachea midline LYMPH:  no hepatosplenomegaly, right axillary nodules measuring less than 1-2 mm that are nonspecific. BREAST:right breast normal without mass, skin or nipple changes or axillary nodes, but with small nonspecific nodules measuring 3-5 mm without obvious visualization. LUNGS:  clear to auscultation , decreased breath sounds HEART: regular rate & rhythm ABDOMEN:abdomen soft and normal bowel sounds BACK: No CVA tenderness EXTREMITIES:less then 2 second capillary refill, no joint deformities, effusion, or inflammation, no skin discoloration, no cyanosis  NEURO: alert & oriented x 3 with fluent speech, no focal motor/sensory deficits, gait normal    LABORATORY DATA: CBC    Component Value Date/Time   WBC 7.6 07/28/2014 1040   RBC 4.14 07/28/2014 1040   RBC 4.50 04/15/2014 0852   HGB 12.7 07/28/2014 1040   HCT 39.3 07/28/2014 1040   PLT 183 07/28/2014 1040   MCV 94.9 07/28/2014 1040   MCH 30.7 07/28/2014 1040   MCHC 32.3 07/28/2014 1040   RDW 15.9* 07/28/2014 1040   LYMPHSABS 0.8 07/28/2014 1040   MONOABS 0.7 07/28/2014 1040   EOSABS 0.2 07/28/2014 1040   BASOSABS 0.0 07/28/2014 1040      Chemistry      Component Value Date/Time   NA 138 07/28/2014 1040   K 4.2 07/28/2014 1040  CL 99 07/28/2014 1040   CO2 33* 07/28/2014 1040   BUN 19 07/28/2014 1040   CREATININE 1.45* 07/28/2014 1040      Component Value Date/Time   CALCIUM 8.9 07/28/2014 1040   ALKPHOS 116 07/28/2014 1040   AST 35 07/28/2014 1040   ALT 29 07/28/2014 1040   BILITOT 0.4 07/28/2014 1040       ASSESSMENT AND PLAN:  Non-Hodgkin lymphoma, B-cell, low grade S/P 4 weekly infusions of Rituxan finishing on 01/21/2014 followed by maintenance Rituxan.  Data demonstrates that Rituxan every 60 days is superior and therefore, I will change her antibody plan to reflect this change.  Given her new complaint of small nodules in right axilla and right breast, I will restage her.  On exam, they are nonspecific, however, she has not be restaged since March 2015.    THERAPY PLAN:  We will restage her with a PET scan and change her Rituxan to every 60 days.  She will return in 2 weeks following PET scan.  All questions were answered. The patient knows to call the clinic with any problems,  questions or concerns. We can certainly see the patient much sooner if necessary.  Patient and plan discussed with Dr. Ancil Linsey and she is in agreement with the aforementioned.   KEFALAS,THOMAS 07/28/2014

## 2014-07-25 NOTE — Assessment & Plan Note (Addendum)
S/P 4 weekly infusions of Rituxan finishing on 01/21/2014 followed by maintenance Rituxan.  Data demonstrates that Rituxan every 60 days is superior and therefore, I will change her antibody plan to reflect this change.  Given her new complaint of small nodules in right axilla and right breast, I will restage her.  On exam, they are nonspecific, however, she has not be restaged since March 2015.

## 2014-07-28 ENCOUNTER — Encounter (HOSPITAL_COMMUNITY): Payer: Self-pay

## 2014-07-28 ENCOUNTER — Encounter (HOSPITAL_COMMUNITY): Payer: Medicare HMO | Attending: Hematology & Oncology

## 2014-07-28 ENCOUNTER — Encounter: Payer: Self-pay | Admitting: *Deleted

## 2014-07-28 ENCOUNTER — Encounter (HOSPITAL_BASED_OUTPATIENT_CLINIC_OR_DEPARTMENT_OTHER): Payer: Medicare HMO | Admitting: Oncology

## 2014-07-28 VITALS — BP 112/47 | HR 57 | Temp 97.3°F | Resp 18 | Wt 166.8 lb

## 2014-07-28 DIAGNOSIS — E785 Hyperlipidemia, unspecified: Secondary | ICD-10-CM | POA: Diagnosis not present

## 2014-07-28 DIAGNOSIS — C8300 Small cell B-cell lymphoma, unspecified site: Secondary | ICD-10-CM

## 2014-07-28 DIAGNOSIS — J449 Chronic obstructive pulmonary disease, unspecified: Secondary | ICD-10-CM | POA: Insufficient documentation

## 2014-07-28 DIAGNOSIS — C8590 Non-Hodgkin lymphoma, unspecified, unspecified site: Secondary | ICD-10-CM

## 2014-07-28 DIAGNOSIS — I10 Essential (primary) hypertension: Secondary | ICD-10-CM | POA: Diagnosis not present

## 2014-07-28 DIAGNOSIS — C8510 Unspecified B-cell lymphoma, unspecified site: Secondary | ICD-10-CM | POA: Insufficient documentation

## 2014-07-28 DIAGNOSIS — Z5112 Encounter for antineoplastic immunotherapy: Secondary | ICD-10-CM

## 2014-07-28 LAB — CBC WITH DIFFERENTIAL/PLATELET
BASOS ABS: 0 10*3/uL (ref 0.0–0.1)
Basophils Relative: 0 % (ref 0–1)
EOS ABS: 0.2 10*3/uL (ref 0.0–0.7)
Eosinophils Relative: 2 % (ref 0–5)
HEMATOCRIT: 39.3 % (ref 36.0–46.0)
Hemoglobin: 12.7 g/dL (ref 12.0–15.0)
LYMPHS ABS: 0.8 10*3/uL (ref 0.7–4.0)
LYMPHS PCT: 10 % — AB (ref 12–46)
MCH: 30.7 pg (ref 26.0–34.0)
MCHC: 32.3 g/dL (ref 30.0–36.0)
MCV: 94.9 fL (ref 78.0–100.0)
Monocytes Absolute: 0.7 10*3/uL (ref 0.1–1.0)
Monocytes Relative: 9 % (ref 3–12)
NEUTROS PCT: 79 % — AB (ref 43–77)
Neutro Abs: 6 10*3/uL (ref 1.7–7.7)
Platelets: 183 10*3/uL (ref 150–400)
RBC: 4.14 MIL/uL (ref 3.87–5.11)
RDW: 15.9 % — AB (ref 11.5–15.5)
WBC: 7.6 10*3/uL (ref 4.0–10.5)

## 2014-07-28 LAB — COMPREHENSIVE METABOLIC PANEL
ALT: 29 U/L (ref 0–35)
ANION GAP: 6 (ref 5–15)
AST: 35 U/L (ref 0–37)
Albumin: 3.6 g/dL (ref 3.5–5.2)
Alkaline Phosphatase: 116 U/L (ref 39–117)
BUN: 19 mg/dL (ref 6–23)
CO2: 33 mmol/L — ABNORMAL HIGH (ref 19–32)
Calcium: 8.9 mg/dL (ref 8.4–10.5)
Chloride: 99 mmol/L (ref 96–112)
Creatinine, Ser: 1.45 mg/dL — ABNORMAL HIGH (ref 0.50–1.10)
GFR calc Af Amer: 46 mL/min — ABNORMAL LOW (ref >90)
GFR, EST NON AFRICAN AMERICAN: 39 mL/min — AB (ref >90)
GLUCOSE: 139 mg/dL — AB (ref 70–99)
POTASSIUM: 4.2 mmol/L (ref 3.5–5.1)
Sodium: 138 mmol/L (ref 135–145)
TOTAL PROTEIN: 6.8 g/dL (ref 6.0–8.3)
Total Bilirubin: 0.4 mg/dL (ref 0.3–1.2)

## 2014-07-28 MED ORDER — HEPARIN SOD (PORK) LOCK FLUSH 100 UNIT/ML IV SOLN
500.0000 [IU] | Freq: Once | INTRAVENOUS | Status: AC | PRN
  Administered 2014-07-28: 500 [IU]
  Filled 2014-07-28: qty 5

## 2014-07-28 MED ORDER — DIPHENHYDRAMINE HCL 25 MG PO CAPS
ORAL_CAPSULE | ORAL | Status: AC
  Filled 2014-07-28: qty 2

## 2014-07-28 MED ORDER — SODIUM CHLORIDE 0.9 % IJ SOLN: 10.0000 mL | INTRAMUSCULAR | Status: DC | PRN

## 2014-07-28 MED ORDER — DIPHENHYDRAMINE HCL 25 MG PO CAPS
50.0000 mg | ORAL_CAPSULE | Freq: Once | ORAL | Status: AC
  Administered 2014-07-28: 50 mg via ORAL

## 2014-07-28 MED ORDER — SODIUM CHLORIDE 0.9 % IV SOLN
Freq: Once | INTRAVENOUS | Status: AC
  Administered 2014-07-28: 11:00:00 via INTRAVENOUS

## 2014-07-28 MED ORDER — RITUXIMAB CHEMO INJECTION 500 MG/50ML
500.0000 mg/m2 | Freq: Once | INTRAVENOUS | Status: AC
  Administered 2014-07-28: 900 mg via INTRAVENOUS
  Filled 2014-07-28: qty 90

## 2014-07-28 MED ORDER — ACETAMINOPHEN 325 MG PO TABS
650.0000 mg | ORAL_TABLET | Freq: Once | ORAL | Status: AC
  Administered 2014-07-28: 650 mg via ORAL

## 2014-07-28 MED ORDER — ACETAMINOPHEN 325 MG PO TABS
ORAL_TABLET | ORAL | Status: AC
Start: 2014-07-28 — End: 2014-07-28
  Filled 2014-07-28: qty 2

## 2014-07-28 NOTE — Progress Notes (Signed)
[facility] Clinical Social Work  Clinical Social Work was referred by patient for assessment of psychosocial needs due to insurance questions and concerns.  Clinical Social Worker was approached by patient in the waiting room at [facility] as she shared concerns about her insurance being changed without her knowledge. CSW attempted to problem solve and answer pt's questions. Pt reports a "mix up" with her ss disability as well that she has made several attempts to resolve. CSW made referral to financial counselor, Lendell Caprice who plans to look into issue with pt.     Clinical Social Work interventions: Pt advocacy   Loren Racer, [place] Tuesdays 8:30-1pm Wednesdays 8:30-12pm  [phone]

## 2014-07-28 NOTE — Patient Instructions (Signed)
[facility] Discharge Instructions for Patients Receiving Chemotherapy  Today you received the following chemotherapy agents:  Rituxan  If you develop nausea and vomiting, or diarrhea that is not controlled by your medication, call the clinic.  The clinic phone number is [phone]. Office hours are Monday-Friday 8:30am-5:00pm.  BELOW ARE SYMPTOMS THAT SHOULD BE REPORTED IMMEDIATELY:  *FEVER GREATER THAN 101.0 F  *CHILLS WITH OR WITHOUT FEVER  NAUSEA AND VOMITING THAT IS NOT CONTROLLED WITH YOUR NAUSEA MEDICATION  *UNUSUAL SHORTNESS OF BREATH  *UNUSUAL BRUISING OR BLEEDING  TENDERNESS IN MOUTH AND THROAT WITH OR WITHOUT PRESENCE OF ULCERS  *URINARY PROBLEMS  *BOWEL PROBLEMS  UNUSUAL RASH Items with * indicate a potential emergency and should be followed up as soon as possible. If you have an emergency after office hours please contact your primary care physician or go to the nearest emergency department.  Please call the clinic during office hours if you have any questions or concerns.   You may also contact the Patient Navigator at [phone] should you have any questions or need assistance in obtaining follow up care. _____________________________________________________________________ Have you asked about our STAR program?    STAR stands for Survivorship Training and Rehabilitation, and this is a nationally recognized cancer care program that focuses on survivorship and rehabilitation.  Cancer and cancer treatments may cause problems, such as, pain, making you feel tired and keeping you from doing the things that you need or want to do. Cancer rehabilitation can help. Our goal is to reduce these troubling effects and help you have the best quality of life possible.  You may receive a survey from a nurse that asks questions about your current state of health.  Based on the survey results, all eligible patients will be referred to the [facility] program for an  evaluation so we can better serve you! A frequently asked questions sheet is available upon request.

## 2014-07-28 NOTE — Patient Instructions (Signed)
[place] at [facility]  Discharge Instructions:  We will get your set up for a PET scan to make sure the little nodules in arm are related to your lymphoma. We will continue with Rituxan as planned, but I will change it to every 60 days.  Return in 2-3 weeks after PET scan for follow-up. _______________________________________________________________  Thank you for choosing [place] at [facility] to provide your oncology and hematology care.  To afford each patient quality time with our providers, please arrive at least 15 minutes before your scheduled appointment.  You need to re-schedule your appointment if you arrive 10 or more minutes late.  We strive to give you quality time with our providers, and arriving late affects you and other patients whose appointments are after yours.  Also, if you no show three or more times for appointments you may be dismissed from the clinic.  Again, thank you for choosing [place] at [place] hope is that these requests will allow you access to exceptional care and in a timely manner. _______________________________________________________________  If you have questions after your visit, please contact our office at [phone] between the hours of 8:30 a.m. and 5:00 p.m. Voicemails left after 4:30 p.m. will not be returned until the following business day. _______________________________________________________________  For prescription refill requests, have your pharmacy contact our office. _______________________________________________________________  Recommendations made by the consultant and any test results will be sent to your referring physician. _______________________________________________________________

## 2014-08-12 ENCOUNTER — Ambulatory Visit (HOSPITAL_COMMUNITY)
Admission: RE | Admit: 2014-08-12 | Discharge: 2014-08-12 | Disposition: A | Discharge status: Home / Self Care | Payer: Medicare HMO | Source: Ambulatory Visit | Attending: Oncology | Admitting: Oncology

## 2014-08-12 ENCOUNTER — Telehealth (HOSPITAL_COMMUNITY): Payer: Self-pay | Admitting: Oncology

## 2014-08-12 ENCOUNTER — Encounter (HOSPITAL_COMMUNITY): Payer: Self-pay | Admitting: Emergency Medicine

## 2014-08-12 ENCOUNTER — Emergency Department (HOSPITAL_COMMUNITY): Payer: Medicare HMO

## 2014-08-12 ENCOUNTER — Emergency Department (HOSPITAL_COMMUNITY)
Admission: EM | Admit: 2014-08-12 | Discharge: 2014-08-12 | Disposition: A | Discharge status: Home / Self Care | Payer: Medicare HMO | Attending: Emergency Medicine | Admitting: Emergency Medicine

## 2014-08-12 ENCOUNTER — Other Ambulatory Visit: Payer: Self-pay

## 2014-08-12 DIAGNOSIS — Z8673 Personal history of transient ischemic attack (TIA), and cerebral infarction without residual deficits: Secondary | ICD-10-CM | POA: Insufficient documentation

## 2014-08-12 DIAGNOSIS — Z720 Tobacco use: Secondary | ICD-10-CM | POA: Diagnosis not present

## 2014-08-12 DIAGNOSIS — I2510 Atherosclerotic heart disease of native coronary artery without angina pectoris: Secondary | ICD-10-CM | POA: Diagnosis not present

## 2014-08-12 DIAGNOSIS — Z87442 Personal history of urinary calculi: Secondary | ICD-10-CM | POA: Insufficient documentation

## 2014-08-12 DIAGNOSIS — Z8719 Personal history of other diseases of the digestive system: Secondary | ICD-10-CM | POA: Insufficient documentation

## 2014-08-12 DIAGNOSIS — F419 Anxiety disorder, unspecified: Secondary | ICD-10-CM | POA: Diagnosis not present

## 2014-08-12 DIAGNOSIS — C8590 Non-Hodgkin lymphoma, unspecified, unspecified site: Secondary | ICD-10-CM | POA: Diagnosis present

## 2014-08-12 DIAGNOSIS — I509 Heart failure, unspecified: Secondary | ICD-10-CM | POA: Insufficient documentation

## 2014-08-12 DIAGNOSIS — Z8701 Personal history of pneumonia (recurrent): Secondary | ICD-10-CM | POA: Diagnosis not present

## 2014-08-12 DIAGNOSIS — F329 Major depressive disorder, single episode, unspecified: Secondary | ICD-10-CM | POA: Diagnosis not present

## 2014-08-12 DIAGNOSIS — M1990 Unspecified osteoarthritis, unspecified site: Secondary | ICD-10-CM | POA: Insufficient documentation

## 2014-08-12 DIAGNOSIS — Z9889 Other specified postprocedural states: Secondary | ICD-10-CM | POA: Diagnosis not present

## 2014-08-12 DIAGNOSIS — E785 Hyperlipidemia, unspecified: Secondary | ICD-10-CM | POA: Diagnosis not present

## 2014-08-12 DIAGNOSIS — Z8589 Personal history of malignant neoplasm of other organs and systems: Secondary | ICD-10-CM | POA: Insufficient documentation

## 2014-08-12 DIAGNOSIS — Z7902 Long term (current) use of antithrombotics/antiplatelets: Secondary | ICD-10-CM | POA: Diagnosis not present

## 2014-08-12 DIAGNOSIS — R079 Chest pain, unspecified: Secondary | ICD-10-CM

## 2014-08-12 DIAGNOSIS — I252 Old myocardial infarction: Secondary | ICD-10-CM | POA: Insufficient documentation

## 2014-08-12 DIAGNOSIS — Z8572 Personal history of non-Hodgkin lymphomas: Secondary | ICD-10-CM | POA: Diagnosis not present

## 2014-08-12 DIAGNOSIS — Z862 Personal history of diseases of the blood and blood-forming organs and certain disorders involving the immune mechanism: Secondary | ICD-10-CM | POA: Diagnosis not present

## 2014-08-12 DIAGNOSIS — G8929 Other chronic pain: Secondary | ICD-10-CM | POA: Insufficient documentation

## 2014-08-12 DIAGNOSIS — J441 Chronic obstructive pulmonary disease with (acute) exacerbation: Secondary | ICD-10-CM | POA: Diagnosis not present

## 2014-08-12 DIAGNOSIS — Z7982 Long term (current) use of aspirin: Secondary | ICD-10-CM | POA: Diagnosis not present

## 2014-08-12 DIAGNOSIS — Z79899 Other long term (current) drug therapy: Secondary | ICD-10-CM | POA: Diagnosis not present

## 2014-08-12 LAB — COMPREHENSIVE METABOLIC PANEL
ALK PHOS: 87 U/L (ref 39–117)
ALT: 28 U/L (ref 0–35)
ANION GAP: 8 (ref 5–15)
AST: 27 U/L (ref 0–37)
Albumin: 3.8 g/dL (ref 3.5–5.2)
BUN: 20 mg/dL (ref 6–23)
CALCIUM: 8.8 mg/dL (ref 8.4–10.5)
CO2: 30 mmol/L (ref 19–32)
Chloride: 104 mmol/L (ref 96–112)
Creatinine, Ser: 1.16 mg/dL — ABNORMAL HIGH (ref 0.50–1.10)
GFR calc non Af Amer: 52 mL/min — ABNORMAL LOW (ref >90)
GFR, EST AFRICAN AMERICAN: 60 mL/min — AB (ref >90)
GLUCOSE: 115 mg/dL — AB (ref 70–99)
Potassium: 3.4 mmol/L — ABNORMAL LOW (ref 3.5–5.1)
SODIUM: 142 mmol/L (ref 135–145)
Total Bilirubin: 0.5 mg/dL (ref 0.3–1.2)
Total Protein: 6.1 g/dL (ref 6.0–8.3)

## 2014-08-12 LAB — CBC WITH DIFFERENTIAL/PLATELET
BASOS PCT: 0 % (ref 0–1)
Basophils Absolute: 0 10*3/uL (ref 0.0–0.1)
EOS ABS: 0.1 10*3/uL (ref 0.0–0.7)
Eosinophils Relative: 1 % (ref 0–5)
HCT: 39.4 % (ref 36.0–46.0)
Hemoglobin: 12.7 g/dL (ref 12.0–15.0)
LYMPHS PCT: 11 % — AB (ref 12–46)
Lymphs Abs: 0.7 10*3/uL (ref 0.7–4.0)
MCH: 30.7 pg (ref 26.0–34.0)
MCHC: 32.2 g/dL (ref 30.0–36.0)
MCV: 95.2 fL (ref 78.0–100.0)
Monocytes Absolute: 0.4 10*3/uL (ref 0.1–1.0)
Monocytes Relative: 6 % (ref 3–12)
NEUTROS ABS: 4.9 10*3/uL (ref 1.7–7.7)
NEUTROS PCT: 82 % — AB (ref 43–77)
PLATELETS: 116 10*3/uL — AB (ref 150–400)
RBC: 4.14 MIL/uL (ref 3.87–5.11)
RDW: 15.7 % — ABNORMAL HIGH (ref 11.5–15.5)
WBC: 6 10*3/uL (ref 4.0–10.5)

## 2014-08-12 LAB — GLUCOSE, CAPILLARY: GLUCOSE-CAPILLARY: 117 mg/dL — AB (ref 70–99)

## 2014-08-12 LAB — TROPONIN I

## 2014-08-12 LAB — BRAIN NATRIURETIC PEPTIDE: B Natriuretic Peptide: 59.9 pg/mL (ref 0.0–100.0)

## 2014-08-12 MED ORDER — PREDNISONE 20 MG PO TABS: 60.0000 mg | ORAL_TABLET | Freq: Every day | ORAL | Status: DC

## 2014-08-12 MED ORDER — HEPARIN SOD (PORK) LOCK FLUSH 100 UNIT/ML IV SOLN
500.0000 [IU] | Freq: Once | INTRAVENOUS | Status: AC
  Administered 2014-08-12: 500 [IU]
  Filled 2014-08-12: qty 5

## 2014-08-12 MED ORDER — AZITHROMYCIN 250 MG PO TABS: 250.0000 mg | ORAL_TABLET | Freq: Every day | ORAL | Status: DC

## 2014-08-12 NOTE — Telephone Encounter (Signed)
Lennette Bihari from Radiology called.  He reports that [name] is there for PET imaging.  [name] complaining of substernal chest pain.  I recommended she report to the ED for evaluation.  Reschedule PET scan (hopefully prior to 2/17) and we will see her in follow-up as planned on 2/17.    KEFALAS,THOMAS 08/12/2014 10:56 AM

## 2014-08-12 NOTE — Discharge Instructions (Signed)
Chest Pain (Nonspecific) °It is often hard to give a specific diagnosis for the cause of chest pain. There is always a chance that your pain could be related to something serious, such as a heart attack or a blood clot in the lungs. You need to follow up with your health care provider for further evaluation. °CAUSES  °· Heartburn. °· Pneumonia or bronchitis. °· Anxiety or stress. °· Inflammation around your heart (pericarditis) or lung (pleuritis or pleurisy). °· A blood clot in the lung. °· A collapsed lung (pneumothorax). It can develop suddenly on its own (spontaneous pneumothorax) or from trauma to the chest. °· Shingles infection (herpes zoster virus). °The chest wall is composed of bones, muscles, and cartilage. Any of these can be the source of the pain. °· The bones can be bruised by injury. °· The muscles or cartilage can be strained by coughing or overwork. °· The cartilage can be affected by inflammation and become sore (costochondritis). °DIAGNOSIS  °Lab tests or other studies may be needed to find the cause of your pain. Your health care provider may have you take a test called an ambulatory electrocardiogram (ECG). An ECG records your heartbeat patterns over a 24-hour period. You may also have other tests, such as: °· Transthoracic echocardiogram (TTE). During echocardiography, sound waves are used to evaluate how blood flows through your heart. °· Transesophageal echocardiogram (TEE). °· Cardiac monitoring. This allows your health care provider to monitor your heart rate and rhythm in real time. °· Holter monitor. This is a portable device that records your heartbeat and can help diagnose heart arrhythmias. It allows your health care provider to track your heart activity for several days, if needed. °· Stress tests by exercise or by giving medicine that makes the heart beat faster. °TREATMENT  °· Treatment depends on what may be causing your chest pain. Treatment may include: °· Acid blockers for  heartburn. °· Anti-inflammatory medicine. °· Pain medicine for inflammatory conditions. °· Antibiotics if an infection is present. °· You may be advised to change lifestyle habits. This includes stopping smoking and avoiding alcohol, caffeine, and chocolate. °· You may be advised to keep your head raised (elevated) when sleeping. This reduces the chance of acid going backward from your stomach into your esophagus. °Most of the time, nonspecific chest pain will improve within 2-3 days with rest and mild pain medicine.  °HOME CARE INSTRUCTIONS  °· If antibiotics were prescribed, take them as directed. Finish them even if you start to feel better. °· For the next few days, avoid physical activities that bring on chest pain. Continue physical activities as directed. °· Do not use any tobacco products, including cigarettes, chewing tobacco, or electronic cigarettes. °· Avoid drinking alcohol. °· Only take medicine as directed by your health care provider. °· Follow your health care provider's suggestions for further testing if your chest pain does not go away. °· Keep any follow-up appointments you made. If you do not go to an appointment, you could develop lasting (chronic) problems with pain. If there is any problem keeping an appointment, call to reschedule. °SEEK MEDICAL CARE IF:  °· Your chest pain does not go away, even after treatment. °· You have a rash with blisters on your chest. °· You have a fever. °SEEK IMMEDIATE MEDICAL CARE IF:  °· You have increased chest pain or pain that spreads to your arm, neck, jaw, back, or abdomen. °· You have shortness of breath. °· You have an increasing cough, or you cough   up blood. °· You have severe back or abdominal pain. °· You feel nauseous or vomit. °· You have severe weakness. °· You faint. °· You have chills. °This is an emergency. Do not wait to see if the pain will go away. Get medical help at once. Call your local emergency services (911 in [place] Do not drive  yourself to the hospital. °MAKE SURE YOU:  °· Understand these instructions. °· Will watch your condition. °· Will get help right away if you are not doing well or get worse. °Document Released: 03/28/2005 Document Revised: 06/23/2013 Document Reviewed: 01/22/2008 °Exit[employer]® Patient Information ©2015 [employer]. This information is not intended to replace advice given to you by your health care provider. Make sure you discuss any questions you have with your health care provider. °Chronic Obstructive Pulmonary Disease Exacerbation °Chronic obstructive pulmonary disease (COPD) is a common lung condition in which airflow from the lungs is limited. COPD is a general term that can be used to describe many different lung problems that limit airflow, including chronic bronchitis and emphysema. COPD exacerbations are episodes when breathing symptoms become much worse and require extra treatment. Without treatment, COPD exacerbations can be life threatening, and frequent COPD exacerbations can cause further damage to your lungs. °CAUSES  °· Respiratory infections.   °· Exposure to smoke.   °· Exposure to air pollution, chemical fumes, or dust. °Sometimes there is no apparent cause or trigger. °RISK FACTORS °· Smoking cigarettes. °· Older age. °· Frequent prior COPD exacerbations. °SIGNS AND SYMPTOMS  °· Increased coughing.   °· Increased thick spit (sputum) production.   °· Increased wheezing.   °· Increased shortness of breath.   °· Rapid breathing.   °· Chest tightness. °DIAGNOSIS  °Your medical history, a physical exam, and tests will help your health care provider make a diagnosis. Tests may include: °· A chest X-ray. °· Basic lab tests. °· Sputum testing. °· An arterial blood gas test. °TREATMENT  °Depending on the severity of your COPD exacerbation, you may need to be admitted to a hospital for treatment. Some of the treatments commonly used to treat COPD exacerbations are:  °· Antibiotic medicines.    °· Bronchodilators. These are drugs that expand the air passages. They may be given with an inhaler or nebulizer. Spacer devices may be needed to help improve drug delivery. °· Corticosteroid medicines. °· Supplemental oxygen therapy.   °HOME CARE INSTRUCTIONS  °· Do not smoke. Quitting smoking is very important to prevent COPD from getting worse and exacerbations from happening as often. °· Avoid exposure to all substances that irritate the airway, especially to tobacco smoke.   °· If you were prescribed an antibiotic medicine, finish it all even if you start to feel better. °· Take all medicines as directed by your health care provider. It is important to use correct technique with inhaled medicines. °· Drink enough fluids to keep your urine clear or pale yellow (unless you have a medical condition that requires fluid restriction). °· Use a cool mist vaporizer. This makes it easier to clear your chest when you cough.   °· If you have a home nebulizer and oxygen, continue to use them as directed.   °· Maintain all necessary vaccinations to prevent infections.   °· Exercise regularly.   °· Eat a healthy diet.   °· Keep all follow-up appointments as directed by your health care provider. °SEEK IMMEDIATE MEDICAL CARE IF: °· You have worsening shortness of breath.   °· You have trouble talking.   °· You have severe chest pain. °· You have blood in your sputum.  °·   You have a fever. °· You have weakness, vomit repeatedly, or faint.   °· You feel confused.   °· You continue to get worse. °MAKE SURE YOU:  °· Understand these instructions. °· Will watch your condition. °· Will get help right away if you are not doing well or get worse. °Document Released: 04/15/2007 Document Revised: 11/02/2013 Document Reviewed: 02/20/2013 °Exit[employer]® Patient Information ©2015 [employer]. This information is not intended to replace advice given to you by your health care provider. Make sure you discuss any questions you have with your  health care provider. ° °

## 2014-08-12 NOTE — ED Provider Notes (Signed)
CSN: [id number]     Arrival date & time 08/12/14  1105 History   First MD Initiated Contact with Patient 08/12/14 1114     Chief Complaint  Patient presents with  . Chest Pain     (Consider location/radiation/quality/duration/timing/severity/associated sxs/prior Treatment) HPI Comments: Patient presents to the ER for evaluation of chest pain. Patient was in radiology having a routine PET scan when she mentioned that she was experiencing chest pain and was sent to the ER for evaluation. Patient reports that the pain began when she was in the car on the way to the hospital this morning. She reports a sharp and dull pain on the right side of her chest. It did not radiate anywhere. Pain is now resolved. She does have shortness of breath, but this is chronic. She has a history of COPD. Patient reports that she has been sick for more than a month with nasal congestion, chest congestion and cough.   Past Medical History  Diagnosis Date  . Coronary artery disease     status post stenting of the aortic and iliac vessels by Dr. Gwenlyn Found. Repeat aortic stenting 2010  . Left ventricular dysfunction     hx of with ejection fraction of 35% range.  . Carotid artery disease     hx of carotid cerebrovascular disease with 0-39% bilateral internal carotid artery setenosis   . Non Hodgkin's lymphoma   . Chronic back pain     status post lumbar surgery  . Hyperlipidemia   . Hypertension   . COPD (chronic obstructive pulmonary disease)   . Shortness of breath   . Cancer     hx of non hogdin lymphoma  . CHF (congestive heart failure)   . Blood transfusion   . Arthritis   . Myocardial infarction   . Peripheral vascular disease   . GERD (gastroesophageal reflux disease)   . Anemia   . Osteoporosis   . Depression   . Anxiety   . Other malignant lymphomas, unspecified site, extranodal and solid organ sites 2004    Back  . Asthma   . Vulvar cancer   . Anginal pain     occ last time last wk  .  Dysrhythmia     ?  . Stroke 04  . Pneumonia     hx  . History of kidney stones   . Seizures     ? yrs ago  . Lymphocytic lymphoma 12/24/2013  . Non-Hodgkin lymphoma, B-cell, low grade 12/24/2013   Past Surgical History  Procedure Laterality Date  . [facility] surg    . Back surgery      x 2  . Lung biopsy    . Cardiac catheterization    . Abdominal aortogram  08/08/2011  . Abdominal hysterectomy      partial  . Portacath placement      pt says its for chemo only and its an old port(04)   . Dilation and curettage of uterus    . Vulva /perineum biopsy  04/09/2012    Procedure: VULVAR BIOPSY;  Surgeon: Florian Buff, MD;  Location: [facility];  Service: Gynecology;  Laterality: N/A;  . Vulvectomy  05/20/2012    Procedure: VULVECTOMY;  Surgeon: Alvino Chapel, MD;  Location: [facility];  Service: Gynecology;  Laterality: Bilateral;  BILATERAL SIMPLE VULVECTOMIES  . Video assisted thoracoscopy (vats)/wedge resection Right 12/02/2013    Procedure: VIDEO ASSISTED THORACOSCOPY (VATS)/WEDGE RESECTION;  Surgeon: Melrose Nakayama, MD;  Location: [facility];  Service:  Thoracic;  Laterality: Right;  . Abdominal aortagram N/A 08/08/2011    Procedure: ABDOMINAL Maxcine Ham;  Surgeon: Sherren Mocha, MD;  Location: [facility] CATH LAB;  Service: Cardiovascular;  Laterality: N/A;  . Percutaneous stent intervention Bilateral 08/08/2011    Procedure: PERCUTANEOUS STENT INTERVENTION;  Surgeon: Sherren Mocha, MD;  Location: [facility] CATH LAB;  Service: Cardiovascular;  Laterality: Bilateral;   Family History  Problem Relation Age of Onset  . Coronary artery disease      positive for vascular and cardiac disease  . Heart disease Mother   . Lung cancer Mother   . Heart attack Mother   . Heart failure Mother   . Heart disease Father   . Kidney cancer Father   . Heart attack Father   . Heart failure Father   . Diabetes Sister   . Aortic aneurysm Sister   . Heart attack Brother   . Asthma Son   . Sleep apnea Son   .  Endometriosis Daughter    History  Substance Use Topics  . Smoking status: Current Every Day Smoker -- 2.50 packs/day for 40 years    Types: Cigarettes  . Smokeless tobacco: Never Used  . Alcohol Use: No   OB History    No data available     Review of Systems  HENT: Positive for congestion.   Respiratory: Positive for cough and shortness of breath.   Cardiovascular: Positive for chest pain.  All other systems reviewed and are negative.     Allergies  Review of patient's allergies indicates no known allergies.  Home Medications   Prior to Admission medications   Medication Sig Start Date End Date Taking? Authorizing Provider  albuterol (VENTOLIN HFA) 108 (90 BASE) MCG/ACT inhaler Inhale 2 puffs into the lungs every 6 (six) hours as needed for wheezing or shortness of breath (wheezing and shortness of breath). For asthma/wheezing   Yes Historical Provider, MD  ALPRAZolam Duanne Moron) 1 MG tablet Take 1 mg by mouth 3 (three) times daily as needed for anxiety or sleep (sleep).    Yes Historical Provider, MD  amitriptyline (ELAVIL) 50 MG tablet Take 50 mg by mouth at bedtime.   Yes Historical Provider, MD  aspirin 81 MG EC tablet Take 81 mg by mouth daily.     Yes Historical Provider, MD  buPROPion (WELLBUTRIN XL) 150 MG 24 hr tablet Take 150 mg by mouth daily.   Yes Historical Provider, MD  clopidogrel (PLAVIX) 75 MG tablet TAKE 1 TABLET DAILY WITH BREAKFAST. 05/04/14  Yes Blane Ohara, MD  cycloSPORINE (RESTASIS) 0.05 % ophthalmic emulsion Place 1 drop into both eyes 2 (two) times daily.   Yes Historical Provider, MD  diazepam (VALIUM) 5 MG tablet Take 5 mg by mouth every 6 (six) hours as needed for muscle spasms (muscle spasms). For muscle spasms   Yes Historical Provider, MD  furosemide (LASIX) 40 MG tablet Take 40 mg by mouth daily as needed for fluid (fluid).  05/19/13  Yes Historical Provider, MD  isosorbide mononitrate (IMDUR) 30 MG 24 hr tablet TAKE 1/2 TABLET BY MOUTH  DAILY. Patient taking differently: Take 1/2 Tablet By Mouth Twice Daily 06/30/14  Yes Blane Ohara, MD  metoprolol (LOPRESSOR) 50 MG tablet TAKE (1/2) TABLET BY MOUTH TWICE DAILY. 02/03/14  Yes Blane Ohara, MD  mupirocin ointment (BACTROBAN) 2 % Apply 1 application topically daily as needed (leg infection).  03/13/12  Yes Historical Provider, MD  NEXIUM 40 MG capsule Take 40 mg by mouth 2 (  two) times daily.  12/29/11  Yes Historical Provider, MD  oxyCODONE (OXYCONTIN) 80 MG 12 hr tablet Take 80 mg by mouth 4 (four) times daily.   Yes Historical Provider, MD  oxycodone (ROXICODONE) 30 MG immediate release tablet Take 30 mg by mouth every 4 (four) hours as needed for pain (pain). For breakthrough pain   Yes Historical Provider, MD  rosuvastatin (CRESTOR) 40 MG tablet Take 40 mg by mouth daily.     Yes Historical Provider, MD  SYMBICORT 160-4.5 MCG/ACT inhaler Inhale 2 puffs into the lungs every 12 (twelve) hours.  06/01/14  Yes Historical Provider, MD  nitroGLYCERIN (NITROSTAT) 0.4 MG SL tablet Place 0.4 mg under the tongue every 5 (five) minutes as needed for chest pain (chest pain).     Historical Provider, MD  sulfamethoxazole-trimethoprim (BACTRIM DS) 800-160 MG per tablet Take 1 tablet by mouth 2 (two) times a week. Takes 1 Tablet By Mouth BID ONLY on Saturdays and Sundays.    Historical Provider, MD   BP 127/55 mmHg  Pulse 74  Temp(Src) 98.4 F (36.9 C) (Oral)  Resp 20  SpO2 94% Physical Exam  Constitutional: [name] oriented to person, place, and time. She appears well-developed and well-nourished. No distress.  HENT:  Head: Normocephalic and atraumatic.  Right Ear: Hearing normal.  Left Ear: Hearing normal.  Nose: Nose normal.  Mouth/Throat: Oropharynx is clear and moist and mucous membranes are normal.  Eyes: Conjunctivae and EOM are normal. Pupils are equal, round, and reactive to light.  Neck: Normal range of motion. Neck supple.  Cardiovascular: Regular rhythm, S1 normal and  S2 normal.  Exam reveals no gallop and no friction rub.   No murmur heard. Pulmonary/Chest: Effort normal. No respiratory distress. She has decreased breath sounds. She exhibits no tenderness.  Abdominal: Soft. Normal appearance and bowel sounds are normal. There is no hepatosplenomegaly. There is no tenderness. There is no rebound, no guarding, no tenderness at McBurney's point and negative Murphy's sign. No hernia.  Musculoskeletal: Normal range of motion.  Neurological: [name] alert and oriented to person, place, and time. She has normal strength. No cranial nerve deficit or sensory deficit. Coordination normal. GCS eye subscore is 4. GCS verbal subscore is 5. GCS motor subscore is 6.  Skin: Skin is warm, dry and intact. No rash noted. No cyanosis.  Psychiatric: She has a normal mood and affect. Her speech is normal and behavior is normal. Thought content normal.  Nursing note and vitals reviewed.   ED Course  Procedures (including critical care time) Labs Review Labs Reviewed  CBC WITH DIFFERENTIAL/PLATELET - Abnormal; Notable for the following:    RDW 15.7 (*)    Platelets 116 (*)    Neutrophils Relative % 82 (*)    Lymphocytes Relative 11 (*)    All other components within normal limits  COMPREHENSIVE METABOLIC PANEL - Abnormal; Notable for the following:    Potassium 3.4 (*)    Glucose, Bld 115 (*)    Creatinine, Ser 1.16 (*)    GFR calc non Af Amer 52 (*)    GFR calc Af Amer 60 (*)    All other components within normal limits  TROPONIN I  BRAIN NATRIURETIC PEPTIDE    Imaging Review Dg Chest 2 View  08/12/2014   CLINICAL DATA:  Chest pain.  EXAM: CHEST  2 VIEW  COMPARISON:  12/15/2013.  FINDINGS: Port-A-Cath noted with tip projected over the superior vena cava. Mediastinum and hilar structures are normal. Left base  pleural-parenchymal thickening noted consistent with scarring. Postsurgical changes left base. Heart size normal.  IMPRESSION: 1. Port-A-Cath in good anatomic  position. 2. Left base pleural-parenchymal thickening consistent with postsurgical change and scarring. No acute cardiopulmonary disease.   Electronically Signed   By: Marcello Moores  Register   On: 08/12/2014 13:35     EKG Interpretation None      Date: 08/12/2014  Rate: 70  Rhythm: normal sinus rhythm  QRS Axis: normal  Intervals: normal  ST/T Wave abnormalities: normal  Conduction Disutrbances: none  Narrative Interpretation: unremarkable     MDM   Final diagnoses:  Chest pain    Patient presents to the ER for evaluation of atypical chest pain symptoms. Patient reports a sharp pain on the right side of her chest that began while she was driving in to have a PET scan, but it did resolve spontaneously. She has not had any recurrent symptoms. This is very atypical for cardiac chest pain. EKG is normal, troponin normal. Patient is not experiencing any shortness of breath currently, vital signs are stable. It is not felt to be consistent with PE. Patient does have a history of COPD and CHF. No concern for decompensated CHF. Will treat for COPD, discharge and follow-up with primary doctor as an outpatient.    Orpah Greek, MD 08/12/14 [phone]

## 2014-08-12 NOTE — ED Notes (Addendum)
Pt c/o chest pain that started this morning on right side of chest. Pt was going to have PET scan done this am. Pt states now that pain is in ears. Denies n/v. Pt states that pain caused SOB as well. Pt has hx of cancer.

## 2014-08-13 ENCOUNTER — Ambulatory Visit (HOSPITAL_COMMUNITY): Payer: Medicare HMO

## 2014-08-18 ENCOUNTER — Ambulatory Visit (HOSPITAL_COMMUNITY): Payer: Self-pay | Admitting: Hematology & Oncology

## 2014-08-19 ENCOUNTER — Encounter (HOSPITAL_COMMUNITY): Payer: Medicare HMO

## 2014-08-20 ENCOUNTER — Encounter (HOSPITAL_COMMUNITY)
Admission: RE | Admit: 2014-08-20 | Discharge: 2014-08-20 | Disposition: A | Discharge status: Home / Self Care | Payer: Medicare HMO | Source: Ambulatory Visit | Attending: Oncology | Admitting: Oncology

## 2014-08-20 ENCOUNTER — Encounter (HOSPITAL_COMMUNITY): Payer: Self-pay

## 2014-08-20 DIAGNOSIS — C8590 Non-Hodgkin lymphoma, unspecified, unspecified site: Secondary | ICD-10-CM | POA: Diagnosis not present

## 2014-08-20 LAB — GLUCOSE, CAPILLARY: GLUCOSE-CAPILLARY: 110 mg/dL — AB (ref 70–99)

## 2014-08-20 MED ORDER — FLUDEOXYGLUCOSE F - 18 (FDG) INJECTION
8.3300 | Freq: Once | INTRAVENOUS | Status: AC | PRN
  Administered 2014-08-20: 8.33 via INTRAVENOUS

## 2014-08-26 ENCOUNTER — Ambulatory Visit (HOSPITAL_COMMUNITY): Payer: Self-pay | Admitting: Hematology & Oncology

## 2014-08-27 ENCOUNTER — Other Ambulatory Visit: Payer: Self-pay | Admitting: Cardiovascular Disease

## 2014-08-30 NOTE — Progress Notes (Signed)
[name], MD [address] [place] [address]  Non-Hodgkin lymphoma, B-cell, low grade - Plan: CBC with Differential, Comprehensive metabolic panel, Lactate dehydrogenase, Sedimentation rate, Beta 2 microglobuline, serum, C-reactive protein, CBC with Differential, Comprehensive metabolic panel, Lactate dehydrogenase  CURRENT THERAPY: Maintenance Rituxan  INTERVAL HISTORY: [name] 59 y.o. 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      Non-Hodgkin lymphoma, B-cell, low grade   12/02/2013 Pathology Results Interpretation Tissue-Flow Cytometry MONOCLONAL B CELL POPULATION IDENTIFIED.   12/02/2013 Initial Diagnosis Diagnosis Lung, wedge biopsy/resection, Right lower lobe - LOW GRADE NON-HODGKIN'S B CELL LYMPHOMA   12/31/2013 - 01/21/2014 Chemotherapy Rituxan weekly x 4 cycles   04/15/2014 -  Chemotherapy Maintenance Rituxan x 2 years   08/20/2014 PET scan No findings to suggest residual or recurrent  lymphoma in the neck, chest, abdomen or pelvis.   I personally reviewed and went over laboratory results with the patient.  The results are noted within this dictation.  I personally reviewed and went over radiographic studies with the patient.  The results are noted within this dictation.  PET scan demonstrates NED.  Chart reviewed.  She reported to the ED for chest pain.  It was worked up and no cardiac origin identified.  She was subsequently discharged from the ED.  Hematologically, she denies any complaints and ROS questioning is negative.   Past Medical History  Diagnosis Date  . Coronary artery disease     status post stenting of the aortic and iliac vessels by Dr. Gwenlyn Found. Repeat aortic stenting 2010  . Left ventricular dysfunction     hx of with ejection fraction of 35% range.  . Carotid artery disease     hx of carotid cerebrovascular disease with 0-39% bilateral internal carotid artery  setenosis   . Non Hodgkin's lymphoma   . Chronic back pain     status post lumbar surgery  . Hyperlipidemia   . Hypertension   . COPD (chronic obstructive pulmonary disease)   . Shortness of breath   . Cancer     hx of non hogdin lymphoma  . CHF (congestive heart failure)   . Blood transfusion   . Arthritis   . Myocardial infarction   . Peripheral vascular disease   . GERD (gastroesophageal reflux disease)   . Anemia   . Osteoporosis   . Depression   . Anxiety   . Other malignant lymphomas, unspecified site, extranodal and solid organ sites 2004    Back  . Asthma   . Vulvar cancer   . Anginal pain     occ last time last wk  . Dysrhythmia     ?  . Stroke 04  . Pneumonia     hx  . History of kidney stones   . Seizures     ? yrs ago  . Lymphocytic lymphoma 12/24/2013  . Non-Hodgkin lymphoma, B-cell, low grade 12/24/2013    has HYPERLIPIDEMIA; HYPERTENSION, BENIGN; CAD, NATIVE VESSEL; LEFT VENTRICULAR FUNCTION, DECREASED; CAROTID ARTERY DISEASE; AORTIC ATHEROSCLEROSIS; ATHEROSCLEROSIS, RENAL ARTERY; Atherosclerosis of native arteries of the extremities with rest pain; PVD; COPD; NON-HODGKIN'S LYMPHOMA, HX OF; Vulvar intraepithelial neoplasia III (VIN III); Vulvar cancer; Langerhans cell histiocytosis of lung, 2009; and Non-Hodgkin lymphoma, B-cell, low grade on her problem list.     is allergic to naproxen.  Ms. [name] does not currently have medications on file.  Past Surgical History  Procedure  Laterality Date  . Lumar surg    . Back surgery      x 2  . Lung biopsy    . Cardiac catheterization    . Abdominal aortogram  08/08/2011  . Abdominal hysterectomy      partial  . Portacath placement      pt says its for chemo only and its an old port(04)   . Dilation and curettage of uterus    . Vulva /perineum biopsy  04/09/2012    Procedure: VULVAR BIOPSY;  Surgeon: Florian Buff, MD;  Location: [facility];  Service: Gynecology;  Laterality: N/A;  . Vulvectomy  05/20/2012     Procedure: VULVECTOMY;  Surgeon: Alvino Chapel, MD;  Location: [facility];  Service: Gynecology;  Laterality: Bilateral;  BILATERAL SIMPLE VULVECTOMIES  . Video assisted thoracoscopy (vats)/wedge resection Right 12/02/2013    Procedure: VIDEO ASSISTED THORACOSCOPY (VATS)/WEDGE RESECTION;  Surgeon: Melrose Nakayama, MD;  Location: [facility];  Service: Thoracic;  Laterality: Right;  . Abdominal aortagram N/A 08/08/2011    Procedure: ABDOMINAL Maxcine Ham;  Surgeon: Sherren Mocha, MD;  Location: [facility] CATH LAB;  Service: Cardiovascular;  Laterality: N/A;  . Percutaneous stent intervention Bilateral 08/08/2011    Procedure: PERCUTANEOUS STENT INTERVENTION;  Surgeon: Sherren Mocha, MD;  Location: [facility] CATH LAB;  Service: Cardiovascular;  Laterality: Bilateral;    Denies any headaches, dizziness, double vision, fevers, chills, night sweats, nausea, vomiting, diarrhea, constipation, chest pain, heart palpitations, shortness of breath, blood in stool, black tarry stool, urinary pain, urinary burning, urinary frequency, hematuria.   PHYSICAL EXAMINATION  ECOG PERFORMANCE STATUS: 1 - Symptomatic but completely ambulatory  Filed Vitals:   08/31/14 1402  BP: 94/61  Pulse: 79  Temp: 98.4 F (36.9 C)  Resp: 20    GENERAL:alert, no distress, well nourished, well developed, comfortable, cooperative, smiling and chronically ill appearing SKIN: skin color, texture, turgor are normal, no rashes or significant lesions HEAD: Normocephalic, No masses, lesions, tenderness or abnormalities EYES: normal, PERRLA, EOMI, Conjunctiva are pink and non-injected EARS: External ears normal OROPHARYNX:lips, buccal mucosa, and tongue normal and mucous membranes are moist  NECK: supple, thyroid normal size, non-tender, without nodularity, no stridor, non-tender, trachea midline LYMPH:  no palpable lymphadenopathy BREAST:not examined LUNGS: clear to auscultation and percussion, decreased breath sounds HEART: regular rate &  rhythm, no murmurs, no gallops, S1 normal and S2 normal ABDOMEN:abdomen soft, non-tender and normal bowel sounds BACK: Back symmetric, no curvature., No CVA tenderness EXTREMITIES:less then 2 second capillary refill, no joint deformities, effusion, or inflammation, no edema, no skin discoloration, no clubbing, no cyanosis  NEURO: alert & oriented x 3 with fluent speech, no focal motor/sensory deficits, gait normal   LABORATORY DATA: CBC    Component Value Date/Time   WBC 6.0 08/12/2014 1224   RBC 4.14 08/12/2014 1224   RBC 4.50 04/15/2014 0852   HGB 12.7 08/12/2014 1224   HCT 39.4 08/12/2014 1224   PLT 116* 08/12/2014 1224   MCV 95.2 08/12/2014 1224   MCH 30.7 08/12/2014 1224   MCHC 32.2 08/12/2014 1224   RDW 15.7* 08/12/2014 1224   LYMPHSABS 0.7 08/12/2014 1224   MONOABS 0.4 08/12/2014 1224   EOSABS 0.1 08/12/2014 1224   BASOSABS 0.0 08/12/2014 1224      Chemistry      Component Value Date/Time   NA 142 08/12/2014 1224   K 3.4* 08/12/2014 1224   CL 104 08/12/2014 1224   CO2 30 08/12/2014 1224   BUN 20 08/12/2014 1224  CREATININE 1.16* 08/12/2014 1224      Component Value Date/Time   CALCIUM 8.8 08/12/2014 1224   ALKPHOS 87 08/12/2014 1224   AST 27 08/12/2014 1224   ALT 28 08/12/2014 1224   BILITOT 0.5 08/12/2014 1224       RADIOGRAPHIC STUDIES:  Dg Chest 2 View  08/12/2014   CLINICAL DATA:  Chest pain.  EXAM: CHEST  2 VIEW  COMPARISON:  12/15/2013.  FINDINGS: Port-A-Cath noted with tip projected over the superior vena cava. Mediastinum and hilar structures are normal. Left base pleural-parenchymal thickening noted consistent with scarring. Postsurgical changes left base. Heart size normal.  IMPRESSION: 1. Port-A-Cath in good anatomic position. 2. Left base pleural-parenchymal thickening consistent with postsurgical change and scarring. No acute cardiopulmonary disease.   Electronically Signed   By: Marcello Moores  Register   On: 08/12/2014 13:35   Nm Pet Image Restag  (ps) Skull Base To Thigh  08/20/2014   CLINICAL DATA:  Subsequent treatment strategy for non-Hodgkin's lymphoma. Restaging examination.  EXAM: NUCLEAR MEDICINE PET SKULL BASE TO THIGH  TECHNIQUE: 8.33 mCi F-18 FDG was injected intravenously. Full-ring PET imaging was performed from the skull base to thigh after the radiotracer. CT data was obtained and used for attenuation correction and anatomic localization.  FASTING BLOOD GLUCOSE:  Value: 100 and mg/dl  COMPARISON:  PET-CT 09/15/2013.  FINDINGS: NECK  No hypermetabolic lymph nodes in the neck.  CHEST  Previously noted enlarged hypermetabolic left axillary lymph node has resolved. No hypermetabolic mediastinal or hilar nodes. No suspicious pulmonary nodules on the CT scan. Previously noted nodule in the right lower lobe has completely resolved. No new suspicious appearing pulmonary nodules or masses are noted. Postoperative changes of wedge resection in the periphery of the left lower lobe are again noted. Mild diffuse bronchial wall thickening with mild centrilobular and paraseptal emphysema. Left-sided single-lumen porta cath with tip terminating in the distal superior vena cava. Calcifications of the mitral subvalvular apparatus. Atherosclerotic calcifications in the thoracic aorta, great vessels of the mediastinum and right coronary artery.  ABDOMEN/PELVIS  No abnormal hypermetabolic activity within the liver, pancreas, adrenal glands, or spleen. No hypermetabolic lymph nodes in the abdomen or pelvis. Extensive atherosclerosis throughout the abdominal and pelvic vasculature, with stents in the common iliac arteries bilaterally. No significant volume of ascites. No pneumoperitoneum. Status post hysterectomy. Ovaries are not confidently identified may be surgically absent or atrophic.  SKELETON  No focal hypermetabolic activity to suggest skeletal metastasis.  IMPRESSION: 1. No findings to suggest residual or recurrent lymphoma in the neck, chest, abdomen or  pelvis. 2. Complete resolution of previously noted subsolid nodule in the right lower lobe, indicative of an infectious or inflammatory process on the prior study. 3. Mild diffuse bronchial wall thickening with mild centrilobular and paraseptal emphysema; imaging findings suggestive of underlying COPD. 4. Atherosclerosis, including right coronary artery disease. Please note that although the presence of coronary artery calcium documents the presence of coronary artery disease, the severity of this disease and any potential stenosis cannot be assessed on this non-gated CT examination. Assessment for potential risk factor modification, dietary therapy or pharmacologic therapy may be warranted, if clinically indicated. 5. Additional incidental findings, as above.   Electronically Signed   By: Vinnie Langton M.D.   On: 08/20/2014 13:14     ASSESSMENT AND PLAN:  Non-Hodgkin lymphoma, B-cell, low grade S/P 4 weekly infusions of Rituxan finishing on 01/21/2014 followed by maintenance Rituxan q 60 days. PET scan performed on 08/20/2014 demonstrates NED.  Rituxan infusion on 09/22/2014 as planned.  Labs the day of Rituxan infusion in ~3 weeks: CBC diff, CMET, LDH, ESR, CRP, B2M.  Labs 2 months later: CBC diff, CMET, LDH.  Return in 2 months for next Rituxan infusion and follow-up appointment.    THERAPY PLAN:  Continue with maintenance Rituxan x 2 years.  All questions were answered. The patient knows to call the clinic with any problems, questions or concerns. We can certainly see the patient much sooner if necessary.  Patient and plan discussed with Dr. Ancil Linsey and she is in agreement with the aforementioned.   This note is electronically signed by: Robynn Pane 08/31/2014 2:36 PM

## 2014-08-30 NOTE — Assessment & Plan Note (Addendum)
S/P 4 weekly infusions of Rituxan finishing on 01/21/2014 followed by maintenance Rituxan q 60 days. PET scan performed on 08/20/2014 demonstrates NED.  Rituxan infusion on 09/22/2014 as planned.  Labs the day of Rituxan infusion in ~3 weeks: CBC diff, CMET, LDH, ESR, CRP, B2M.  Labs 2 months later: CBC diff, CMET, LDH.  Return in 2 months for next Rituxan infusion and follow-up appointment.

## 2014-08-31 ENCOUNTER — Encounter (HOSPITAL_COMMUNITY): Payer: Self-pay | Admitting: Oncology

## 2014-08-31 ENCOUNTER — Encounter (HOSPITAL_COMMUNITY): Payer: Medicare HMO | Attending: Oncology | Admitting: Oncology

## 2014-08-31 VITALS — BP 94/61 | HR 79 | Temp 98.4°F | Resp 20 | Wt 159.3 lb

## 2014-08-31 DIAGNOSIS — J449 Chronic obstructive pulmonary disease, unspecified: Secondary | ICD-10-CM | POA: Insufficient documentation

## 2014-08-31 DIAGNOSIS — C8510 Unspecified B-cell lymphoma, unspecified site: Secondary | ICD-10-CM | POA: Insufficient documentation

## 2014-08-31 DIAGNOSIS — E785 Hyperlipidemia, unspecified: Secondary | ICD-10-CM | POA: Insufficient documentation

## 2014-08-31 DIAGNOSIS — I10 Essential (primary) hypertension: Secondary | ICD-10-CM | POA: Insufficient documentation

## 2014-08-31 DIAGNOSIS — C8590 Non-Hodgkin lymphoma, unspecified, unspecified site: Secondary | ICD-10-CM

## 2014-08-31 NOTE — Patient Instructions (Signed)
[place] at [facility] Discharge Instructions  RECOMMENDATIONS MADE BY THE CONSULTANT AND ANY TEST RESULTS WILL BE SENT TO YOUR REFERRING PHYSICIAN.  Return for Rituxan March 14. Continue Rituxan every 8 weeks. MD appointment in [place]. Report any issues/concerns to clinic as needed prior to appointments.  Thank you for choosing [place] at [facility] to provide your oncology and hematology care.  To afford each patient quality time with our provider, please arrive at least 15 minutes before your scheduled appointment time.    You need to re-schedule your appointment should you arrive 10 or more minutes late.  We strive to give you quality time with our providers, and arriving late affects you and other patients whose appointments are after yours.  Also, if you no show three or more times for appointments you may be dismissed from the clinic at the providers discretion.     Again, thank you for choosing [facility].  Our hope is that these requests will decrease the amount of time that you wait before being seen by our physicians.       _____________________________________________________________  Should you have questions after your visit to [facility], please contact our office at [phone] between the hours of 8:30 a.m. and 4:30 p.m.  Voicemails left after 4:30 p.m. will not be returned until the following business day.  For prescription refill requests, have your pharmacy contact our office.

## 2014-09-02 ENCOUNTER — Encounter (HOSPITAL_COMMUNITY): Payer: Self-pay | Admitting: Hematology & Oncology

## 2014-09-13 ENCOUNTER — Inpatient Hospital Stay (HOSPITAL_COMMUNITY): Payer: Medicare HMO

## 2014-09-13 ENCOUNTER — Other Ambulatory Visit (HOSPITAL_COMMUNITY): Payer: Self-pay | Admitting: Pulmonary Disease

## 2014-09-13 ENCOUNTER — Ambulatory Visit (HOSPITAL_COMMUNITY)
Admission: RE | Admit: 2014-09-13 | Discharge: 2014-09-13 | Disposition: A | Discharge status: Home / Self Care | Payer: Medicare HMO | Source: Ambulatory Visit | Attending: Pulmonary Disease | Admitting: Pulmonary Disease

## 2014-09-13 DIAGNOSIS — M25532 Pain in left wrist: Secondary | ICD-10-CM | POA: Diagnosis present

## 2014-09-13 DIAGNOSIS — S62393A Other fracture of third metacarpal bone, left hand, initial encounter for closed fracture: Secondary | ICD-10-CM | POA: Insufficient documentation

## 2014-09-13 DIAGNOSIS — M79642 Pain in left hand: Secondary | ICD-10-CM

## 2014-09-14 ENCOUNTER — Ambulatory Visit (INDEPENDENT_AMBULATORY_CARE_PROVIDER_SITE_OTHER): Payer: Medicare HMO | Admitting: Orthopedic Surgery

## 2014-09-14 ENCOUNTER — Encounter: Payer: Self-pay | Admitting: Orthopedic Surgery

## 2014-09-14 VITALS — BP 92/49 | Ht 64.5 in | Wt 159.3 lb

## 2014-09-14 DIAGNOSIS — S62308A Unspecified fracture of other metacarpal bone, initial encounter for closed fracture: Secondary | ICD-10-CM

## 2014-09-14 DIAGNOSIS — S62331A Displaced fracture of neck of second metacarpal bone, left hand, initial encounter for closed fracture: Secondary | ICD-10-CM

## 2014-09-14 DIAGNOSIS — S62301A Unspecified fracture of second metacarpal bone, left hand, initial encounter for closed fracture: Secondary | ICD-10-CM

## 2014-09-14 MED ORDER — OXYCODONE-ACETAMINOPHEN 10-325 MG PO TABS: 1.0000 | ORAL_TABLET | Freq: Four times a day (QID) | ORAL | Status: DC | PRN
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
## 2014-09-14 NOTE — Patient Instructions (Signed)
Call [facility] therapy dept for brace

## 2014-09-22 ENCOUNTER — Inpatient Hospital Stay (HOSPITAL_COMMUNITY): Payer: Medicare HMO

## 2014-09-28 ENCOUNTER — Inpatient Hospital Stay (HOSPITAL_COMMUNITY): Payer: Medicare HMO

## 2014-09-29 ENCOUNTER — Ambulatory Visit (HOSPITAL_COMMUNITY): Payer: No Typology Code available for payment source | Attending: Orthopedic Surgery | Admitting: Specialist

## 2014-09-29 ENCOUNTER — Encounter (HOSPITAL_COMMUNITY): Payer: Self-pay | Admitting: Specialist

## 2014-09-29 DIAGNOSIS — M2560 Stiffness of unspecified joint, not elsewhere classified: Secondary | ICD-10-CM | POA: Diagnosis not present

## 2014-09-29 DIAGNOSIS — M79645 Pain in left finger(s): Secondary | ICD-10-CM | POA: Diagnosis not present

## 2014-09-29 DIAGNOSIS — S62339D Displaced fracture of neck of unspecified metacarpal bone, subsequent encounter for fracture with routine healing: Secondary | ICD-10-CM | POA: Diagnosis not present
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
## 2014-09-29 NOTE — Patient Instructions (Signed)
Your Splint This splint should initially be fitted by a healthcare practitioner.  The healthcare practitioner is responsible for providing wearing instructions and precautions to the patient, other healthcare practitioners and care provider involved in the patient's care.  This splint was custom made for you. Please read the following instructions to learn about wearing and caring for your splint.  Precautions Should your splint cause any of the following problems, remove the splint immediately and contact your therapist/physician.  Swelling  Severe Pain  Pressure Areas  Stiffness  Numbness  Do not wear your splint while operating machinery unless it has been fabricated for that purpose.  When To Wear Your Splint Where your splint according to your therapist/physician instructions. All the time - remove for hygiene purposes only  Care and Cleaning of Your Splint 1. Keep your splint away from open flames. 2. Your splint will lose its shape in temperatures over 135 degrees Farenheit, ( in car windows, near radiators, ovens or in hot water).  Never make any adjustments to your splint, if the splint needs adjusting remove it and make an appointment to see your therapist. 3. Your splint, including the cushion liner may be cleaned with soap and lukewarm water.  Do not immerse in hot water over 135 degrees Farenheit. 4. Straps may be washed with soap and water, but do not moisten the self-adhesive portion. 5. For ink or hard to remove spots use a scouring cleanser which contains chlorine.  Rinse the splint thoroughly after using chlorine cleanser   QUESTIONS CALL [phone]

## 2014-10-05 ENCOUNTER — Inpatient Hospital Stay (HOSPITAL_COMMUNITY): Payer: Medicare HMO

## 2014-10-06 ENCOUNTER — Inpatient Hospital Stay (HOSPITAL_COMMUNITY): Payer: Medicare HMO

## 2014-10-08 ENCOUNTER — Encounter (HOSPITAL_COMMUNITY): Payer: Self-pay

## 2014-10-12 ENCOUNTER — Ambulatory Visit: Payer: Medicare HMO | Admitting: Orthopedic Surgery

## 2014-10-12 ENCOUNTER — Encounter: Payer: Self-pay | Admitting: Orthopedic Surgery

## 2014-10-25 ENCOUNTER — Other Ambulatory Visit: Payer: Self-pay | Admitting: Cardiovascular Disease

## 2014-11-08 ENCOUNTER — Inpatient Hospital Stay (HOSPITAL_COMMUNITY): Payer: Medicare HMO

## 2014-11-17 ENCOUNTER — Inpatient Hospital Stay (HOSPITAL_COMMUNITY): Payer: Medicare HMO

## 2014-11-17 ENCOUNTER — Ambulatory Visit (HOSPITAL_COMMUNITY): Payer: Medicare HMO | Admitting: Hematology & Oncology

## 2014-11-18 NOTE — Progress Notes (Signed)
This encounter was created in error - please disregard.

## 2014-12-07 ENCOUNTER — Encounter (HOSPITAL_COMMUNITY): Payer: Self-pay | Admitting: Oncology

## 2014-12-07 ENCOUNTER — Encounter (HOSPITAL_BASED_OUTPATIENT_CLINIC_OR_DEPARTMENT_OTHER): Payer: Medicare HMO | Admitting: Oncology

## 2014-12-07 ENCOUNTER — Encounter (HOSPITAL_COMMUNITY): Payer: Medicare HMO | Attending: Oncology

## 2014-12-07 VITALS — BP 96/55 | HR 73 | Temp 97.6°F | Resp 18

## 2014-12-07 VITALS — BP 111/69 | HR 65 | Resp 20 | Wt 149.0 lb

## 2014-12-07 DIAGNOSIS — Z95828 Presence of other vascular implants and grafts: Secondary | ICD-10-CM

## 2014-12-07 DIAGNOSIS — C8590 Non-Hodgkin lymphoma, unspecified, unspecified site: Secondary | ICD-10-CM | POA: Diagnosis not present

## 2014-12-07 DIAGNOSIS — C8510 Unspecified B-cell lymphoma, unspecified site: Secondary | ICD-10-CM | POA: Diagnosis not present

## 2014-12-07 DIAGNOSIS — I10 Essential (primary) hypertension: Secondary | ICD-10-CM | POA: Insufficient documentation

## 2014-12-07 DIAGNOSIS — Z5112 Encounter for antineoplastic immunotherapy: Secondary | ICD-10-CM

## 2014-12-07 DIAGNOSIS — J449 Chronic obstructive pulmonary disease, unspecified: Secondary | ICD-10-CM | POA: Insufficient documentation

## 2014-12-07 DIAGNOSIS — E785 Hyperlipidemia, unspecified: Secondary | ICD-10-CM | POA: Diagnosis not present

## 2014-12-07 LAB — CBC WITH DIFFERENTIAL/PLATELET
Basophils Absolute: 0 10*3/uL (ref 0.0–0.1)
Basophils Relative: 0 % (ref 0–1)
EOS ABS: 0.1 10*3/uL (ref 0.0–0.7)
Eosinophils Relative: 2 % (ref 0–5)
HCT: 39.9 % (ref 36.0–46.0)
HEMOGLOBIN: 13.2 g/dL (ref 12.0–15.0)
Lymphocytes Relative: 18 % (ref 12–46)
Lymphs Abs: 1.2 10*3/uL (ref 0.7–4.0)
MCH: 29.6 pg (ref 26.0–34.0)
MCHC: 33.1 g/dL (ref 30.0–36.0)
MCV: 89.5 fL (ref 78.0–100.0)
MONO ABS: 0.9 10*3/uL (ref 0.1–1.0)
MONOS PCT: 13 % — AB (ref 3–12)
NEUTROS ABS: 4.8 10*3/uL (ref 1.7–7.7)
Neutrophils Relative %: 67 % (ref 43–77)
Platelets: 149 10*3/uL — ABNORMAL LOW (ref 150–400)
RBC: 4.46 MIL/uL (ref 3.87–5.11)
RDW: 14.2 % (ref 11.5–15.5)
WBC: 7.1 10*3/uL (ref 4.0–10.5)

## 2014-12-07 LAB — COMPREHENSIVE METABOLIC PANEL
ALBUMIN: 3.6 g/dL (ref 3.5–5.0)
ALK PHOS: 118 U/L (ref 38–126)
ALT: 15 U/L (ref 14–54)
AST: 15 U/L (ref 15–41)
Anion gap: 9 (ref 5–15)
BUN: 29 mg/dL — AB (ref 6–20)
CALCIUM: 8.9 mg/dL (ref 8.9–10.3)
CO2: 29 mmol/L (ref 22–32)
Chloride: 101 mmol/L (ref 101–111)
Creatinine, Ser: 1.38 mg/dL — ABNORMAL HIGH (ref 0.44–1.00)
GFR calc non Af Amer: 42 mL/min — ABNORMAL LOW (ref >60)
GFR, EST AFRICAN AMERICAN: 48 mL/min — AB (ref >60)
GLUCOSE: 121 mg/dL — AB (ref 65–99)
POTASSIUM: 3.5 mmol/L (ref 3.5–5.1)
Sodium: 139 mmol/L (ref 135–145)
TOTAL PROTEIN: 6.6 g/dL (ref 6.5–8.1)
Total Bilirubin: 0.3 mg/dL (ref 0.3–1.2)

## 2014-12-07 LAB — LACTATE DEHYDROGENASE: LDH: 153 U/L (ref 98–192)

## 2014-12-07 MED ORDER — HEPARIN SOD (PORK) LOCK FLUSH 100 UNIT/ML IV SOLN
500.0000 [IU] | Freq: Once | INTRAVENOUS | Status: AC
  Administered 2014-12-07: 500 [IU] via INTRAVENOUS

## 2014-12-07 MED ORDER — RITUXIMAB CHEMO INJECTION 500 MG/50ML
500.0000 mg/m2 | Freq: Once | INTRAVENOUS | Status: AC
  Administered 2014-12-07: 900 mg via INTRAVENOUS
  Filled 2014-12-07: qty 80

## 2014-12-07 MED ORDER — DIPHENHYDRAMINE HCL 25 MG PO CAPS
50.0000 mg | ORAL_CAPSULE | Freq: Once | ORAL | Status: DC
  Administered 2014-12-07: 50 mg via ORAL
  Filled 2014-12-07: qty 2

## 2014-12-07 MED ORDER — HEPARIN SOD (PORK) LOCK FLUSH 100 UNIT/ML IV SOLN
500.0000 [IU] | Freq: Once | INTRAVENOUS | Status: DC | PRN
  Filled 2014-12-07: qty 5

## 2014-12-07 MED ORDER — ACETAMINOPHEN 325 MG PO TABS
650.0000 mg | ORAL_TABLET | Freq: Once | ORAL | Status: DC
  Administered 2014-12-07: 650 mg via ORAL
  Filled 2014-12-07: qty 2

## 2014-12-07 MED ORDER — SODIUM CHLORIDE 0.9 % IJ SOLN: 10.0000 mL | INTRAMUSCULAR | Status: DC | PRN

## 2014-12-07 MED ORDER — SODIUM CHLORIDE 0.9 % IV SOLN
Freq: Once | INTRAVENOUS | Status: DC
  Administered 2014-12-07: 11:00:00 via INTRAVENOUS

## 2014-12-07 NOTE — Progress Notes (Signed)
Tina Bogus, MD [place] [name] [name]  Non-Hodgkin lymphoma, B-cell, low grade - Plan: 0.9 %  sodium chloride infusion, sodium chloride 0.9 % injection 10 mL, heparin lock flush 100 unit/mL, acetaminophen (TYLENOL) tablet 650 mg, diphenhydrAMINE (BENADRYL) capsule 50 mg, CBC with Differential, Comprehensive metabolic panel, Lactate dehydrogenase, CBC with Differential, Comprehensive metabolic panel, Lactate dehydrogenase, Sedimentation rate, Beta 2 microglobuline, serum, C-reactive protein  CURRENT THERAPY: Maintenance Rituxan  INTERVAL HISTORY: [name] 59 y.o. 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      Non-Hodgkin lymphoma, B-cell, low grade   12/02/2013 Pathology Results Interpretation Tissue-Flow Cytometry MONOCLONAL B CELL POPULATION IDENTIFIED.   12/02/2013 Initial Diagnosis Diagnosis Lung, wedge biopsy/resection, Right lower lobe - LOW GRADE NON-HODGKIN'S B CELL LYMPHOMA   12/31/2013 - 01/21/2014 Chemotherapy Rituxan weekly x 4 cycles   04/15/2014 -  Chemotherapy Maintenance Rituxan x 2 years   08/20/2014 PET scan No findings to suggest residual or recurrent  lymphoma in the neck, chest, abdomen or pelvis.   I personally reviewed and went over laboratory results with the patient.  The results are noted within this dictation.  "I come down with a cold and I didn't know if I was contagious."  "I don't know if it was a cold or my COPD."  She has not been treated in approximately 6 months.    "I thought about just stopping treatment since I don't have any cancer in my body."  I provided her education regarding maintenance Rituxan and the improvement in progression-free and overall survival when used in her situation.  [name] agreeable to continue treatment as planned.  She denies any fevers, chills, night sweats, and unintentional weight loss.  Hematologically, she denies any  complaints and ROS questioning is negative.   Past Medical History  Diagnosis Date  . Coronary artery disease     status post stenting of the aortic and iliac vessels by Dr. Gwenlyn Found. Repeat aortic stenting 2010  . Left ventricular dysfunction     hx of with ejection fraction of 35% range.  . Carotid artery disease     hx of carotid cerebrovascular disease with 0-39% bilateral internal carotid artery setenosis   . Non Hodgkin's lymphoma   . Chronic back pain     status post lumbar surgery  . Hyperlipidemia   . Hypertension   . COPD (chronic obstructive pulmonary disease)   . Shortness of breath   . Cancer     hx of non hogdin lymphoma  . CHF (congestive heart failure)   . Blood transfusion   . Arthritis   . Myocardial infarction   . Peripheral vascular disease   . GERD (gastroesophageal reflux disease)   . Anemia   . Osteoporosis   . Depression   . Anxiety   . Other malignant lymphomas, unspecified site, extranodal and solid organ sites 2004    Back  . Asthma   . Vulvar cancer   . Anginal pain     occ last time last wk  . Dysrhythmia     ?  . Stroke 04  . Pneumonia     hx  . History of kidney stones   . Seizures     ? yrs ago  . Lymphocytic lymphoma 12/24/2013  . Non-Hodgkin lymphoma, B-cell, low grade 12/24/2013    has HYPERLIPIDEMIA; HYPERTENSION, BENIGN; CAD, NATIVE VESSEL; LEFT VENTRICULAR FUNCTION, DECREASED; CAROTID ARTERY  DISEASE; AORTIC ATHEROSCLEROSIS; ATHEROSCLEROSIS, RENAL ARTERY; Atherosclerosis of native arteries of the extremities with rest pain; PVD; COPD; NON-HODGKIN'S LYMPHOMA, HX OF; Vulvar intraepithelial neoplasia III (VIN III); Vulvar cancer; Langerhans cell histiocytosis of lung, 2009; and Non-Hodgkin lymphoma, B-cell, low grade on her problem list.     is allergic to naproxen.  Ms. Rupert does not currently have medications on file.  Past Surgical History  Procedure Laterality Date  . Lumar surg    . Back surgery      x 2  . Lung biopsy      . Cardiac catheterization    . Abdominal aortogram  08/08/2011  . Abdominal hysterectomy      partial  . Portacath placement      pt says its for chemo only and its an old port(04)   . Dilation and curettage of uterus    . Vulva /perineum biopsy  04/09/2012    Procedure: VULVAR BIOPSY;  Surgeon: Florian Buff, MD;  Location: [facility];  Service: Gynecology;  Laterality: N/A;  . Vulvectomy  05/20/2012    Procedure: VULVECTOMY;  Surgeon: Alvino Chapel, MD;  Location: [facility];  Service: Gynecology;  Laterality: Bilateral;  BILATERAL SIMPLE VULVECTOMIES  . Video assisted thoracoscopy (vats)/wedge resection Right 12/02/2013    Procedure: VIDEO ASSISTED THORACOSCOPY (VATS)/WEDGE RESECTION;  Surgeon: Melrose Nakayama, MD;  Location: [place];  Service: Thoracic;  Laterality: Right;  . Abdominal aortagram N/A 08/08/2011    Procedure: ABDOMINAL Maxcine Ham;  Surgeon: Sherren Mocha, MD;  Location: [facility] CATH LAB;  Service: Cardiovascular;  Laterality: N/A;  . Percutaneous stent intervention Bilateral 08/08/2011    Procedure: PERCUTANEOUS STENT INTERVENTION;  Surgeon: Sherren Mocha, MD;  Location: [facility] CATH LAB;  Service: Cardiovascular;  Laterality: Bilateral;    Denies any headaches, dizziness, double vision, fevers, chills, night sweats, nausea, vomiting, diarrhea, constipation, chest pain, heart palpitations, shortness of breath, blood in stool, black tarry stool, urinary pain, urinary burning, urinary frequency, hematuria.   PHYSICAL EXAMINATION  ECOG PERFORMANCE STATUS: 1 - Symptomatic but completely ambulatory  Filed Vitals:   12/07/14 0926  BP: 111/69  Pulse: 65  Resp: 20    GENERAL:alert, no distress, well nourished, well developed, comfortable, cooperative, smiling and chronically ill appearing SKIN: skin color, texture, turgor are normal, no rashes or significant lesions HEAD: Normocephalic, No masses, lesions, tenderness or abnormalities EYES: normal, PERRLA, EOMI, Conjunctiva are  pink and non-injected EARS: External ears normal OROPHARYNX:lips, buccal mucosa, and tongue normal and mucous membranes are moist  NECK: supple, thyroid normal size, non-tender, without nodularity, no stridor, non-tender, trachea midline LYMPH:  no palpable lymphadenopathy BREAST:not examined LUNGS: clear to auscultation and percussion, decreased breath sounds HEART: regular rate & rhythm, no murmurs, no gallops, S1 normal and S2 normal ABDOMEN:abdomen soft, non-tender and normal bowel sounds BACK: Back symmetric, no curvature., No CVA tenderness EXTREMITIES:less then 2 second capillary refill, no joint deformities, effusion, or inflammation, no edema, no skin discoloration, no clubbing, no cyanosis  NEURO: alert & oriented x 3 with fluent speech, no focal motor/sensory deficits, gait normal   LABORATORY DATA: CBC    Component Value Date/Time   WBC 6.0 08/12/2014 1224   RBC 4.14 08/12/2014 1224   RBC 4.50 04/15/2014 0852   HGB 12.7 08/12/2014 1224   HCT 39.4 08/12/2014 1224   PLT 116* 08/12/2014 1224   MCV 95.2 08/12/2014 1224   MCH 30.7 08/12/2014 1224   MCHC 32.2 08/12/2014 1224   RDW 15.7* 08/12/2014 1224   LYMPHSABS 0.7  08/12/2014 1224   MONOABS 0.4 08/12/2014 1224   EOSABS 0.1 08/12/2014 1224   BASOSABS 0.0 08/12/2014 1224      Chemistry      Component Value Date/Time   NA 142 08/12/2014 1224   K 3.4* 08/12/2014 1224   CL 104 08/12/2014 1224   CO2 30 08/12/2014 1224   BUN 20 08/12/2014 1224   CREATININE 1.16* 08/12/2014 1224      Component Value Date/Time   CALCIUM 8.8 08/12/2014 1224   ALKPHOS 87 08/12/2014 1224   AST 27 08/12/2014 1224   ALT 28 08/12/2014 1224   BILITOT 0.5 08/12/2014 1224       ASSESSMENT AND PLAN:  Non-Hodgkin lymphoma, B-cell, low grade S/P 4 weekly infusions of Rituxan finishing on 01/21/2014 followed by maintenance Rituxan q 60 days. PET scan performed on 08/20/2014 demonstrates NED.    She has missed multiple Rituxan infusions and  to date, she was last treated on 07/28/2014.  Compliance discussed with patient with discussion regarding goals of treatment.   Labs today: CBC diff, CMET, LDH, ESR, CRP, B2M.   Labs in 2 months: CBC diff, CMET, LDH.    Return in 2 months for next Rituxan infusion and follow-up appointment.      THERAPY PLAN:  Continue with maintenance Rituxan x 2 years.  All questions were answered. The patient knows to call the clinic with any problems, questions or concerns. We can certainly see the patient much sooner if necessary.  Patient and plan discussed with Dr. Ancil Linsey and she is in agreement with the aforementioned.   This note is electronically signed by: Robynn Pane 12/07/2014 9:52 AM

## 2014-12-07 NOTE — Progress Notes (Signed)
Tolerated Rituxan infusion well. D/C home to self. Ambulatory on discharge.

## 2014-12-07 NOTE — Patient Instructions (Signed)
[place] at [facility] Discharge Instructions  RECOMMENDATIONS MADE BY THE CONSULTANT AND ANY TEST RESULTS WILL BE SENT TO YOUR REFERRING PHYSICIAN.  Rituxan treatment today. Return in 2 months for Rituxan and office visit.   Thank you for choosing [place] at [facility] to provide your oncology and hematology care.  To afford each patient quality time with our provider, please arrive at least 15 minutes before your scheduled appointment time.    You need to re-schedule your appointment should you arrive 10 or more minutes late.  We strive to give you quality time with our providers, and arriving late affects you and other patients whose appointments are after yours.  Also, if you no show three or more times for appointments you may be dismissed from the clinic at the providers discretion.     Again, thank you for choosing [facility].  Our hope is that these requests will decrease the amount of time that you wait before being seen by our physicians.       _____________________________________________________________  Should you have questions after your visit to [facility], please contact our office at [phone] between the hours of 8:30 a.m. and 4:30 p.m.  Voicemails left after 4:30 p.m. will not be returned until the following business day.  For prescription refill requests, have your pharmacy contact our office.

## 2014-12-07 NOTE — Assessment & Plan Note (Addendum)
S/P 4 weekly infusions of Rituxan finishing on 01/21/2014 followed by maintenance Rituxan q 60 days. PET scan performed on 08/20/2014 demonstrates NED.    She has missed multiple Rituxan infusions and to date, she was last treated on 07/28/2014.  Compliance discussed with patient with discussion regarding goals of treatment.   Labs today: CBC diff, CMET, LDH  Labs in 2 months: CBC diff, CMET, LDH,  ESR, CRP, B2M. [name]    Return in 2 months for next Rituxan infusion and follow-up appointment.

## 2014-12-07 NOTE — Patient Instructions (Signed)
[facility] Discharge Instructions for Patients Receiving Chemotherapy  Today you received the following chemotherapy agents rituxan  To help prevent nausea and vomiting after your treatment, we encourage you to take your nausea medication   If you develop nausea and vomiting, or diarrhea that is not controlled by your medication, call the clinic.  The clinic phone number is [phone]. Office hours are Monday-Friday 8:30am-5:00pm.  BELOW ARE SYMPTOMS THAT SHOULD BE REPORTED IMMEDIATELY:  *FEVER GREATER THAN 101.0 F  *CHILLS WITH OR WITHOUT FEVER  NAUSEA AND VOMITING THAT IS NOT CONTROLLED WITH YOUR NAUSEA MEDICATION  *UNUSUAL SHORTNESS OF BREATH  *UNUSUAL BRUISING OR BLEEDING  TENDERNESS IN MOUTH AND THROAT WITH OR WITHOUT PRESENCE OF ULCERS  *URINARY PROBLEMS  *BOWEL PROBLEMS  UNUSUAL RASH Items with * indicate a potential emergency and should be followed up as soon as possible. If you have an emergency after office hours please contact your primary care physician or go to the nearest emergency department.  Please call the clinic during office hours if you have any questions or concerns.   You may also contact the Patient Navigator at [phone] should you have any questions or need assistance in obtaining follow up care. _____________________________________________________________________ Have you asked about our STAR program?    STAR stands for Survivorship Training and Rehabilitation, and this is a nationally recognized cancer care program that focuses on survivorship and rehabilitation.  Cancer and cancer treatments may cause problems, such as, pain, making you feel tired and keeping you from doing the things that you need or want to do. Cancer rehabilitation can help. Our goal is to reduce these troubling effects and help you have the best quality of life possible.  You may receive a survey from a nurse that asks questions about your current state  of health.  Based on the survey results, all eligible patients will be referred to the [facility] program for an evaluation so we can better serve you! A frequently asked questions sheet is available upon request.

## 2014-12-20 ENCOUNTER — Other Ambulatory Visit: Payer: Self-pay | Admitting: Cardiovascular Disease

## 2015-02-08 ENCOUNTER — Encounter (HOSPITAL_BASED_OUTPATIENT_CLINIC_OR_DEPARTMENT_OTHER): Payer: Medicare HMO

## 2015-02-08 ENCOUNTER — Other Ambulatory Visit (HOSPITAL_COMMUNITY): Payer: Self-pay | Admitting: Hematology & Oncology

## 2015-02-08 ENCOUNTER — Encounter (HOSPITAL_COMMUNITY): Payer: Self-pay | Admitting: Hematology & Oncology

## 2015-02-08 ENCOUNTER — Encounter (HOSPITAL_COMMUNITY): Payer: Medicare HMO | Attending: Oncology | Admitting: Hematology & Oncology

## 2015-02-08 VITALS — BP 103/54 | HR 58 | Temp 97.8°F | Resp 16

## 2015-02-08 VITALS — BP 116/47 | HR 80 | Temp 98.1°F | Resp 20 | Wt 152.5 lb

## 2015-02-08 DIAGNOSIS — K921 Melena: Secondary | ICD-10-CM | POA: Diagnosis not present

## 2015-02-08 DIAGNOSIS — C8590 Non-Hodgkin lymphoma, unspecified, unspecified site: Secondary | ICD-10-CM

## 2015-02-08 DIAGNOSIS — Z1231 Encounter for screening mammogram for malignant neoplasm of breast: Secondary | ICD-10-CM

## 2015-02-08 DIAGNOSIS — Z1239 Encounter for other screening for malignant neoplasm of breast: Secondary | ICD-10-CM

## 2015-02-08 DIAGNOSIS — C8510 Unspecified B-cell lymphoma, unspecified site: Secondary | ICD-10-CM | POA: Diagnosis not present

## 2015-02-08 DIAGNOSIS — J449 Chronic obstructive pulmonary disease, unspecified: Secondary | ICD-10-CM | POA: Diagnosis not present

## 2015-02-08 DIAGNOSIS — Z720 Tobacco use: Secondary | ICD-10-CM

## 2015-02-08 DIAGNOSIS — Z5112 Encounter for antineoplastic immunotherapy: Secondary | ICD-10-CM | POA: Diagnosis not present

## 2015-02-08 DIAGNOSIS — Z8589 Personal history of malignant neoplasm of other organs and systems: Secondary | ICD-10-CM

## 2015-02-08 DIAGNOSIS — I10 Essential (primary) hypertension: Secondary | ICD-10-CM | POA: Insufficient documentation

## 2015-02-08 DIAGNOSIS — E785 Hyperlipidemia, unspecified: Secondary | ICD-10-CM | POA: Insufficient documentation

## 2015-02-08 LAB — LACTATE DEHYDROGENASE: LDH: 157 U/L (ref 98–192)

## 2015-02-08 LAB — CBC WITH DIFFERENTIAL/PLATELET
Basophils Absolute: 0 10*3/uL (ref 0.0–0.1)
Basophils Relative: 0 % (ref 0–1)
Eosinophils Absolute: 0.3 10*3/uL (ref 0.0–0.7)
Eosinophils Relative: 5 % (ref 0–5)
HCT: 40.3 % (ref 36.0–46.0)
Hemoglobin: 12.9 g/dL (ref 12.0–15.0)
LYMPHS ABS: 1.2 10*3/uL (ref 0.7–4.0)
LYMPHS PCT: 21 % (ref 12–46)
MCH: 28.6 pg (ref 26.0–34.0)
MCHC: 32 g/dL (ref 30.0–36.0)
MCV: 89.4 fL (ref 78.0–100.0)
MONO ABS: 0.8 10*3/uL (ref 0.1–1.0)
Monocytes Relative: 14 % — ABNORMAL HIGH (ref 3–12)
NEUTROS ABS: 3.4 10*3/uL (ref 1.7–7.7)
Neutrophils Relative %: 60 % (ref 43–77)
PLATELETS: 117 10*3/uL — AB (ref 150–400)
RBC: 4.51 MIL/uL (ref 3.87–5.11)
RDW: 15.6 % — ABNORMAL HIGH (ref 11.5–15.5)
WBC: 5.7 10*3/uL (ref 4.0–10.5)

## 2015-02-08 LAB — COMPREHENSIVE METABOLIC PANEL
ALBUMIN: 3.7 g/dL (ref 3.5–5.0)
ALT: 11 U/L — ABNORMAL LOW (ref 14–54)
AST: 18 U/L (ref 15–41)
Alkaline Phosphatase: 121 U/L (ref 38–126)
Anion gap: 10 (ref 5–15)
BUN: 19 mg/dL (ref 6–20)
CALCIUM: 8.8 mg/dL — AB (ref 8.9–10.3)
CO2: 31 mmol/L (ref 22–32)
CREATININE: 1.35 mg/dL — AB (ref 0.44–1.00)
Chloride: 99 mmol/L — ABNORMAL LOW (ref 101–111)
GFR calc Af Amer: 49 mL/min — ABNORMAL LOW (ref >60)
GFR, EST NON AFRICAN AMERICAN: 43 mL/min — AB (ref >60)
GLUCOSE: 96 mg/dL (ref 65–99)
Potassium: 3.6 mmol/L (ref 3.5–5.1)
Sodium: 140 mmol/L (ref 135–145)
Total Bilirubin: 0.5 mg/dL (ref 0.3–1.2)
Total Protein: 6.6 g/dL (ref 6.5–8.1)

## 2015-02-08 LAB — C-REACTIVE PROTEIN: CRP: 2.8 mg/dL — AB (ref <1.0)

## 2015-02-08 LAB — SEDIMENTATION RATE: SED RATE: 10 mm/h (ref 0–22)

## 2015-02-08 MED ORDER — SODIUM CHLORIDE 0.9 % IV SOLN
Freq: Once | INTRAVENOUS | Status: AC
  Administered 2015-02-08: 10:00:00 via INTRAVENOUS

## 2015-02-08 MED ORDER — HEPARIN SOD (PORK) LOCK FLUSH 100 UNIT/ML IV SOLN
INTRAVENOUS | Status: AC
  Filled 2015-02-08: qty 5

## 2015-02-08 MED ORDER — DIPHENHYDRAMINE HCL 25 MG PO CAPS
50.0000 mg | ORAL_CAPSULE | Freq: Once | ORAL | Status: AC
Start: 2015-02-08 — End: 2015-02-08
  Administered 2015-02-08: 50 mg via ORAL
  Filled 2015-02-08: qty 2

## 2015-02-08 MED ORDER — ACETAMINOPHEN 325 MG PO TABS
650.0000 mg | ORAL_TABLET | Freq: Once | ORAL | Status: AC
  Administered 2015-02-08: 650 mg via ORAL
  Filled 2015-02-08: qty 2

## 2015-02-08 MED ORDER — SODIUM CHLORIDE 0.9 % IV SOLN
500.0000 mg/m2 | Freq: Once | INTRAVENOUS | Status: AC
  Administered 2015-02-08: 900 mg via INTRAVENOUS
  Filled 2015-02-08: qty 90

## 2015-02-08 MED ORDER — HEPARIN SOD (PORK) LOCK FLUSH 100 UNIT/ML IV SOLN
500.0000 [IU] | Freq: Once | INTRAVENOUS | Status: AC | PRN
  Administered 2015-02-08: 500 [IU]

## 2015-02-08 MED ORDER — SODIUM CHLORIDE 0.9 % IJ SOLN
10.0000 mL | INTRAMUSCULAR | Status: DC | PRN
  Administered 2015-02-08: 10 mL
  Filled 2015-02-08: qty 10

## 2015-02-08 NOTE — Patient Instructions (Addendum)
[place] at [facility] Discharge Instructions  RECOMMENDATIONS MADE BY THE CONSULTANT AND ANY TEST RESULTS WILL BE SENT TO YOUR REFERRING PHYSICIAN.  Exam completed by Dr Whitney Muse  Rituxan today  Rituxan every 2 months Return in 2 months to see the doctor  Please call the clinic if you have any questions or concerns  Thank you for choosing [place] at [facility] to provide your oncology and hematology care.  To afford each patient quality time with our provider, please arrive at least 15 minutes before your scheduled appointment time.    You need to re-schedule your appointment should you arrive 10 or more minutes late.  We strive to give you quality time with our providers, and arriving late affects you and other patients whose appointments are after yours.  Also, if you no show three or more times for appointments you may be dismissed from the clinic at the providers discretion.     Again, thank you for choosing [facility]  Our hope is that these requests will decrease the amount of time that you wait before being seen by our physicians.       _____________________________________________________________  Should you have questions after your visit to [facility], please contact our office at [phone] between the hours of 8:30 a.m. and 4:30 p.m.  Voicemails left after 4:30 p.m. will not be returned until the following business day.  For prescription refill requests, have your pharmacy contact our office.

## 2015-02-08 NOTE — Progress Notes (Signed)
Tolerated Rituxan infusion well. 

## 2015-02-08 NOTE — Progress Notes (Signed)
Tina Bogus, MD [address] [place] [address]  Breast cancer screening - Plan: OXYCONTIN 80 MG T12A 12 hr tablet, gabapentin (NEURONTIN) 300 MG capsule, vitamin B-12 (CYANOCOBALAMIN) 100 MCG tablet, Biotin 1 MG CAPS, MM Digital Screening, CBC with Differential, Comprehensive metabolic panel, Lactate dehydrogenase  Non-Hodgkin lymphoma, B-cell, low grade - Plan: OXYCONTIN 80 MG T12A 12 hr tablet, gabapentin (NEURONTIN) 300 MG capsule, vitamin B-12 (CYANOCOBALAMIN) 100 MCG tablet, Biotin 1 MG CAPS, MM Digital Screening, CBC with Differential, Comprehensive metabolic panel, Lactate dehydrogenase   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    In September of 2004 the patient presented with a several month history of left-sided back pain and was seen in the emergency room at which time a paravertebral left-sided mass at T3 was seen. A core biopsy was consistent with follicular non-Hodgkin's lymphoma with some elements of large cell. PET CT scan revealed mediastinal lymph nodes. Treated with 4 cycles of CHOP-R. started on 03/22/2003 through 05/25/2003 with vincristine dose decreased for the last 2 cycles due to peripheral neuropathy. She received Rituxan maintenance at 2 month intervals for one year and beginning in March of 2006 Rituxan was given at 3 month intervals with treatment ending in December 2006.  She relapsed with a mass at T7 presenting again with back pain at that time treated with Cytoxan and Fludara along with Rituxan starting in July of 2009. Because of myelotoxicity Cytoxan was held for one cycle with repeat study showing no evidence of disease but with persistent thrombocytopenia in the 50-80,000  range. Her paraspinal mass disappeared.. Repeat CT scans on 03/10/2012 showed no evidence of lymphoma.  Sometime in 2009, the patient developed pulmonary lesions and was operated on by Dr. Arlyce Dice at which time Langerhans histiocytosis was diagnosed. Patient was treated with prednisone tapering him 60 mg down to 10 mg a day continued till July of 2013.  PET scan was ordered to restage the patient in 2015 with findings of abnormality in the right upper lobe of the lung as well as the left axilla. Left axilla biopsy was consistent with follicular lymphoma and the patient is currently being evaluated by thoracic surgery for resection of the pulmonary nodule which appears to be a primary lung cancer. Wedge resection of the right lower lobe nodule performed on 10/06/9627 revealed follicular lymphoma identical to left axillary biopsy performed on 09/28/2013     Non-Hodgkin lymphoma, B-cell, low grade   12/02/2013 Pathology Results Interpretation Tissue-Flow Cytometry MONOCLONAL B CELL POPULATION IDENTIFIED.   12/02/2013 Initial Diagnosis Diagnosis Lung, wedge biopsy/resection, Right lower lobe - LOW GRADE NON-HODGKIN'S B CELL LYMPHOMA   12/31/2013 - 01/21/2014 Chemotherapy Rituxan weekly x 4 cycles   04/15/2014 -  Chemotherapy Maintenance Rituxan x 2 years   08/20/2014 PET scan No findings to suggest residual or recurrent  lymphoma in the neck, chest, abdomen or pelvis.    She denies any fevers, chills, night sweats, and unintentional weight loss.  Hematologically, she denies any complaints and ROS questioning is negative.  [name] here today with her sister-in-law. She has been eating and sleeping well. She was put onto gabapentin for neuropathy which helped.  She went to a kidney specialist, Dr. Hinda Lenis, who told her that the gabapentin was "killing her kidneys",  so she threw the pills away.  She has difficulty with her memory due to a previous stroke.  She has not noticed any lumps or bumps. She has had blood  in her stool. The last time was last week. She does not believe she has hemorrhoids. She doesn't recall ever being to a GI specialist. [name] refusing a C-scope.  When asked about her swollen right leg, she reports it is from a previous MRSA infection. She had a vulvectomy "at least 3 years ago" at [facility] with Dr. Fermin Schwab. She has not had any issues since and does not follow up with them.  [name] a smoker, 1 ppd. She reports that she cannot quit, she has tried several times without success. She began smoking at 59 yo. She has not had a mammogram this year. She had a mammogram in [place] but is unsure when.    Past Medical History  Diagnosis Date  . Coronary artery disease     status post stenting of the aortic and iliac vessels by Dr. Gwenlyn Found. Repeat aortic stenting 2010  . Left ventricular dysfunction     hx of with ejection fraction of 35% range.  . Carotid artery disease     hx of carotid cerebrovascular disease with 0-39% bilateral internal carotid artery setenosis   . Non Hodgkin's lymphoma   . Chronic back pain     status post lumbar surgery  . Hyperlipidemia   . Hypertension   . COPD (chronic obstructive pulmonary disease)   . Shortness of breath   . Cancer     hx of non hogdin lymphoma  . CHF (congestive heart failure)   . Blood transfusion   . Arthritis   . Myocardial infarction   . Peripheral vascular disease   . GERD (gastroesophageal reflux disease)   . Anemia   . Osteoporosis   . Depression   . Anxiety   . Other malignant lymphomas, unspecified site, extranodal and solid organ sites 2004    Back  . Asthma   . Vulvar cancer   . Anginal pain     occ last time last wk  . Dysrhythmia     ?  . Stroke 04  . Pneumonia     hx  . History of kidney stones   . Seizures     ? yrs ago  . Lymphocytic lymphoma 12/24/2013  . Non-Hodgkin lymphoma, B-cell, low grade 12/24/2013    has HYPERLIPIDEMIA; HYPERTENSION, BENIGN; CAD, NATIVE VESSEL; LEFT VENTRICULAR  FUNCTION, DECREASED; CAROTID ARTERY DISEASE; AORTIC ATHEROSCLEROSIS; ATHEROSCLEROSIS, RENAL ARTERY; Atherosclerosis of native arteries of the extremities with rest pain; PVD; COPD; NON-HODGKIN'S LYMPHOMA, HX OF; Vulvar intraepithelial neoplasia III (VIN III); Vulvar cancer; Langerhans cell histiocytosis of lung, 2009; and Non-Hodgkin lymphoma, B-cell, low grade on her problem list.     is allergic to naproxen.  [name] had no medications administered during this visit.  Past Surgical History  Procedure Laterality Date  . Lumar surg    . Back surgery      x 2  . Lung biopsy    . Cardiac catheterization    . Abdominal aortogram  08/08/2011  . Abdominal hysterectomy      partial  . Portacath placement      pt says its for chemo only and its an old port(04)   . Dilation and curettage of uterus    . Vulva /perineum biopsy  04/09/2012    Procedure: VULVAR BIOPSY;  Surgeon: Florian Buff, MD;  Location: [facility];  Service: Gynecology;  Laterality: N/A;  . Vulvectomy  05/20/2012    Procedure: VULVECTOMY;  Surgeon: Alvino Chapel, MD;  Location: [facility];  Service: Gynecology;  Laterality: Bilateral;  BILATERAL SIMPLE VULVECTOMIES  . Video assisted thoracoscopy (vats)/wedge resection Right 12/02/2013    Procedure: VIDEO ASSISTED THORACOSCOPY (VATS)/WEDGE RESECTION;  Surgeon: Melrose Nakayama, MD;  Location: [place];  Service: Thoracic;  Laterality: Right;  . Abdominal aortagram N/A 08/08/2011    Procedure: ABDOMINAL Maxcine Ham;  Surgeon: Sherren Mocha, MD;  Location: [facility] CATH LAB;  Service: Cardiovascular;  Laterality: N/A;  . Percutaneous stent intervention Bilateral 08/08/2011    Procedure: PERCUTANEOUS STENT INTERVENTION;  Surgeon: Sherren Mocha, MD;  Location: [facility] CATH LAB;  Service: Cardiovascular;  Laterality: Bilateral;  Smoker, 1 ppd. She reports that she cannot quit, she has tried several times without success. She began smoking at 59 yo.  Denies any headaches, dizziness, double  vision, fevers, chills, night sweats, nausea, vomiting, diarrhea, constipation, chest pain, heart palpitations, shortness of breath, blood in stool, black tarry stool, urinary pain, urinary burning, urinary frequency, hematuria. Positive for neuropathy and memory loss.      Neuropathy in her feet. Memory loss due to previous stroke. Positive for blood in stool.     Last occurrence: last week  PHYSICAL EXAMINATION  ECOG PERFORMANCE STATUS: 1 - Symptomatic but completely ambulatory  Filed Vitals:   02/08/15 0900  BP: 116/47  Pulse: 80  Temp: 98.1 F (36.7 C)  Resp: 20    GENERAL:alert, no distress, well nourished, well developed, comfortable, cooperative, smiling and chronically ill appearing SKIN: skin color, texture, turgor are normal, no rashes or significant lesions HEAD: Normocephalic, No masses, lesions, tenderness or abnormalities EYES: normal, PERRLA, EOMI, Conjunctiva are pink and non-injected EARS: External ears normal OROPHARYNX:lips, buccal mucosa, and tongue normal and mucous membranes are moist  NECK: supple, thyroid normal size, non-tender, without nodularity, no stridor, non-tender, trachea midline LYMPH:  no palpable lymphadenopathy BREAST:not examined LUNGS: clear to auscultation and percussion, decreased breath sounds HEART: regular rate & rhythm, no murmurs, no gallops, S1 normal and S2 normal ABDOMEN:abdomen soft, non-tender and normal bowel sounds BACK: Back symmetric, no curvature., No CVA tenderness EXTREMITIES:less then 2 second capillary refill, no joint deformities, effusion, or inflammation, no edema, no skin discoloration, no clubbing, no cyanosis     Right leg edema. NEURO: alert & oriented x 3 with fluent speech, no focal motor/sensory deficits, gait normal   LABORATORY DATA: CBC    Component Value Date/Time   WBC 5.7 02/08/2015 0930   RBC 4.51 02/08/2015 0930   RBC 4.50 04/15/2014 0852   HGB 12.9 02/08/2015 0930   HCT 40.3 02/08/2015 0930    PLT 117* 02/08/2015 0930   MCV 89.4 02/08/2015 0930   MCH 28.6 02/08/2015 0930   MCHC 32.0 02/08/2015 0930   RDW 15.6* 02/08/2015 0930   LYMPHSABS 1.2 02/08/2015 0930   MONOABS 0.8 02/08/2015 0930   EOSABS 0.3 02/08/2015 0930   BASOSABS 0.0 02/08/2015 0930      Chemistry      Component Value Date/Time   NA 140 02/08/2015 0930   K 3.6 02/08/2015 0930   CL 99* 02/08/2015 0930   CO2 31 02/08/2015 0930   BUN 19 02/08/2015 0930   CREATININE 1.35* 02/08/2015 0930      Component Value Date/Time   CALCIUM 8.8* 02/08/2015 0930   ALKPHOS 121 02/08/2015 0930   AST 18 02/08/2015 0930   ALT 11* 02/08/2015 0930  BILITOT 0.5 02/08/2015 0930      ASSESSMENT AND PLAN:  NHL on maintenance Rituxan Vulvar carinoma, microinvasive squamous cell carcinoma Hematochezia no history of C -scope Tobacco Abuse  I have reviewed the patient's extensive history in regards to her non-Hodgkin's lymphoma. She has no evidence of obvious recurrence. She has not had restaging studies in some time. [name] on maintenance Rituxan and we will continue.  [name] overdue on screening mammogram and agrees for me to order one. I discussed the importance of screening colonoscopy especially given her recent complaints of blood in her stool she is "going to think about it" I advised her that if she does not call prior to her next two-month follow-up that we will schedule her for a GI referral at that visit.  I again discussed the importance of smoking cessation. She does not feel able to quit and is currently not willing to discuss methods to stop smoking.  All questions were answered. The patient knows to call the clinic with any problems, questions or concerns. We can certainly see the patient much sooner if necessary.  This document serves as a record of services personally performed by Ancil Linsey, MD. It was created on her behalf by Arlyce Harman, a trained medical scribe. The creation of this record is based  on the scribe's personal observations and the provider's statements to them. This document has been checked and approved by the attending provider.  I have reviewed the above documentation for accuracy and completeness, and I agree with the above.  This note is electronically signed by: Molli Hazard, MD  02/09/2015 9:16 AM

## 2015-02-09 ENCOUNTER — Encounter (HOSPITAL_COMMUNITY): Payer: Self-pay | Admitting: Hematology & Oncology

## 2015-02-09 LAB — BETA 2 MICROGLOBULIN, SERUM: BETA 2 MICROGLOBULIN: 3.5 mg/L — AB (ref 0.6–2.4)

## 2015-02-14 ENCOUNTER — Ambulatory Visit (HOSPITAL_COMMUNITY)
Admission: RE | Admit: 2015-02-14 | Discharge: 2015-02-14 | Disposition: A | Discharge status: Home / Self Care | Payer: Medicare HMO | Source: Ambulatory Visit | Attending: Hematology & Oncology | Admitting: Hematology & Oncology

## 2015-02-14 DIAGNOSIS — Z1231 Encounter for screening mammogram for malignant neoplasm of breast: Secondary | ICD-10-CM | POA: Insufficient documentation

## 2015-04-06 ENCOUNTER — Encounter (HOSPITAL_BASED_OUTPATIENT_CLINIC_OR_DEPARTMENT_OTHER): Payer: Medicare HMO

## 2015-04-06 ENCOUNTER — Encounter (HOSPITAL_COMMUNITY): Payer: Medicare HMO | Attending: Oncology | Admitting: Oncology

## 2015-04-06 ENCOUNTER — Inpatient Hospital Stay (HOSPITAL_COMMUNITY): Payer: Medicare HMO

## 2015-04-06 ENCOUNTER — Encounter (HOSPITAL_COMMUNITY): Payer: Self-pay | Admitting: Oncology

## 2015-04-06 VITALS — BP 102/35 | HR 70 | Temp 98.1°F | Resp 18 | Wt 143.3 lb

## 2015-04-06 VITALS — BP 92/43 | HR 54 | Temp 97.8°F | Resp 16

## 2015-04-06 DIAGNOSIS — C8258 Diffuse follicle center lymphoma, lymph nodes of multiple sites: Secondary | ICD-10-CM

## 2015-04-06 DIAGNOSIS — I10 Essential (primary) hypertension: Secondary | ICD-10-CM | POA: Insufficient documentation

## 2015-04-06 DIAGNOSIS — Z23 Encounter for immunization: Secondary | ICD-10-CM | POA: Diagnosis not present

## 2015-04-06 DIAGNOSIS — Z5112 Encounter for antineoplastic immunotherapy: Secondary | ICD-10-CM

## 2015-04-06 DIAGNOSIS — C8510 Unspecified B-cell lymphoma, unspecified site: Secondary | ICD-10-CM | POA: Insufficient documentation

## 2015-04-06 DIAGNOSIS — Z720 Tobacco use: Secondary | ICD-10-CM

## 2015-04-06 DIAGNOSIS — E785 Hyperlipidemia, unspecified: Secondary | ICD-10-CM | POA: Insufficient documentation

## 2015-04-06 DIAGNOSIS — J449 Chronic obstructive pulmonary disease, unspecified: Secondary | ICD-10-CM | POA: Diagnosis not present

## 2015-04-06 DIAGNOSIS — C8590 Non-Hodgkin lymphoma, unspecified, unspecified site: Secondary | ICD-10-CM

## 2015-04-06 DIAGNOSIS — L989 Disorder of the skin and subcutaneous tissue, unspecified: Secondary | ICD-10-CM | POA: Diagnosis not present

## 2015-04-06 DIAGNOSIS — Z1239 Encounter for other screening for malignant neoplasm of breast: Secondary | ICD-10-CM

## 2015-04-06 LAB — COMPREHENSIVE METABOLIC PANEL
ALT: 14 U/L (ref 14–54)
ANION GAP: 8 (ref 5–15)
AST: 19 U/L (ref 15–41)
Albumin: 3.3 g/dL — ABNORMAL LOW (ref 3.5–5.0)
Alkaline Phosphatase: 108 U/L (ref 38–126)
BILIRUBIN TOTAL: 0.3 mg/dL (ref 0.3–1.2)
BUN: 24 mg/dL — ABNORMAL HIGH (ref 6–20)
CO2: 28 mmol/L (ref 22–32)
Calcium: 8.3 mg/dL — ABNORMAL LOW (ref 8.9–10.3)
Chloride: 103 mmol/L (ref 101–111)
Creatinine, Ser: 1.45 mg/dL — ABNORMAL HIGH (ref 0.44–1.00)
GFR calc Af Amer: 45 mL/min — ABNORMAL LOW (ref >60)
GFR calc non Af Amer: 39 mL/min — ABNORMAL LOW (ref >60)
GLUCOSE: 135 mg/dL — AB (ref 65–99)
Potassium: 3.6 mmol/L (ref 3.5–5.1)
Sodium: 139 mmol/L (ref 135–145)
TOTAL PROTEIN: 6.4 g/dL — AB (ref 6.5–8.1)

## 2015-04-06 LAB — CBC WITH DIFFERENTIAL/PLATELET
Basophils Absolute: 0 10*3/uL (ref 0.0–0.1)
Basophils Relative: 0 %
EOS ABS: 0.3 10*3/uL (ref 0.0–0.7)
Eosinophils Relative: 4 %
HEMATOCRIT: 39.1 % (ref 36.0–46.0)
Hemoglobin: 13 g/dL (ref 12.0–15.0)
Lymphocytes Relative: 17 %
Lymphs Abs: 1.4 10*3/uL (ref 0.7–4.0)
MCH: 30.1 pg (ref 26.0–34.0)
MCHC: 33.2 g/dL (ref 30.0–36.0)
MCV: 90.5 fL (ref 78.0–100.0)
Monocytes Absolute: 1 10*3/uL (ref 0.1–1.0)
Monocytes Relative: 12 %
NEUTROS ABS: 5.5 10*3/uL (ref 1.7–7.7)
Neutrophils Relative %: 67 %
Platelets: 137 10*3/uL — ABNORMAL LOW (ref 150–400)
RBC: 4.32 MIL/uL (ref 3.87–5.11)
RDW: 15.2 % (ref 11.5–15.5)
WBC: 8.1 10*3/uL (ref 4.0–10.5)

## 2015-04-06 LAB — LACTATE DEHYDROGENASE: LDH: 150 U/L (ref 98–192)

## 2015-04-06 MED ORDER — HEPARIN SOD (PORK) LOCK FLUSH 100 UNIT/ML IV SOLN
INTRAVENOUS | Status: AC
  Filled 2015-04-06: qty 5

## 2015-04-06 MED ORDER — SODIUM CHLORIDE 0.9 % IV SOLN
Freq: Once | INTRAVENOUS | Status: AC
  Administered 2015-04-06: 10:00:00 via INTRAVENOUS

## 2015-04-06 MED ORDER — ACETAMINOPHEN 325 MG PO TABS
650.0000 mg | ORAL_TABLET | Freq: Once | ORAL | Status: AC
  Administered 2015-04-06: 650 mg via ORAL
  Filled 2015-04-06: qty 2

## 2015-04-06 MED ORDER — INFLUENZA VAC SPLIT QUAD 0.5 ML IM SUSY
0.5000 mL | PREFILLED_SYRINGE | Freq: Once | INTRAMUSCULAR | Status: AC
  Administered 2015-04-06: 0.5 mL via INTRAMUSCULAR
  Filled 2015-04-06: qty 0.5

## 2015-04-06 MED ORDER — DIPHENHYDRAMINE HCL 25 MG PO CAPS
50.0000 mg | ORAL_CAPSULE | Freq: Once | ORAL | Status: AC
  Administered 2015-04-06: 50 mg via ORAL
  Filled 2015-04-06: qty 2

## 2015-04-06 MED ORDER — SODIUM CHLORIDE 0.9 % IJ SOLN
10.0000 mL | INTRAMUSCULAR | Status: DC | PRN
  Administered 2015-04-06: 10 mL
  Filled 2015-04-06: qty 10

## 2015-04-06 MED ORDER — HEPARIN SOD (PORK) LOCK FLUSH 100 UNIT/ML IV SOLN
500.0000 [IU] | Freq: Once | INTRAVENOUS | Status: AC | PRN
  Administered 2015-04-06: 500 [IU]

## 2015-04-06 MED ORDER — SODIUM CHLORIDE 0.9 % IV SOLN
500.0000 mg/m2 | Freq: Once | INTRAVENOUS | Status: AC
  Administered 2015-04-06: 900 mg via INTRAVENOUS
  Filled 2015-04-06: qty 80

## 2015-04-06 NOTE — Progress Notes (Signed)
Tolerated infusion well. 

## 2015-04-06 NOTE — Patient Instructions (Addendum)
[place] at [facility] Discharge Instructions  RECOMMENDATIONS MADE BY THE CONSULTANT AND ANY TEST RESULTS WILL BE SENT TO YOUR REFERRING PHYSICIAN.  You were given your Flu Vaccine today.   You will return in two months to see Dr. Marquet Faircloth Muse.  Please see schedule for appointments.   We will continue Rituxan every two months.   Please call us with any questions or concerns.     Thank you for choosing [place] at [facility] to provide your oncology and hematology care.  To afford each patient quality time with our provider, please arrive at least 15 minutes before your scheduled appointment time.    You need to re-schedule your appointment should you arrive 10 or more minutes late.  We strive to give you quality time with our providers, and arriving late affects you and other patients whose appointments are after yours.  Also, if you no show three or more times for appointments you may be dismissed from the clinic at the providers discretion.     Again, thank you for choosing [facility].  Our hope is that these requests will decrease the amount of time that you wait before being seen by our physicians.       _____________________________________________________________  Should you have questions after your visit to [facility], please contact our office at [phone] between the hours of 8:30 a.m. and 4:30 p.m.  Voicemails left after 4:30 p.m. will not be returned until the following business day.  For prescription refill requests, have your pharmacy contact our office.

## 2015-04-06 NOTE — Progress Notes (Signed)
[name], MD Jal Downey [place] [address]  Diffuse follicle center lymphoma of lymph nodes of multiple regions [facility])  CURRENT THERAPY: Maintenance Rituxan  INTERVAL HISTORY: [name] 59 y.o. 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     Oncology History   InSeptember of 2004 the patient presented with a several month history of left-sided back pain and was seen in the emergency room at which time a paravertebral left-sided mass at T3 was seen. A core biopsy was consistent with follicular non-Hodgkin's lymphoma with some elements of large cell. PET CT scan revealed mediastinal lymph nodes. She was treated with 4 cycles of CHOP-R starting on 03/22/2003 through 05/25/2003 with vincristine dose-reduced for the last 2 cycles due to peripheral neuropathy. She received Rituxan maintenance at 2 month intervals for one year and beginning in March of 2006 Rituxan was given at 3 month intervals with treatment ending in December 2006.  She relapsed with a mass at T7 presenting again with back pain.  This time, she was treated with Cytoxan and Fludara + Rituxan starting in July 2009. Because of myelotoxicity Cytoxan was held for one cycle with repeat study showing no evidence of disease but with persistent thrombocytopenia in the 50-80,000 range. Her paraspinal mass disappeared.. Repeat CT scans on 03/10/2012 showed no evidence of lymphoma.  Sometime in 2009, the patient developed pulmonary lesions and was operated on by Dr. Arlyce Dice at which time Langerhans histiocytosis was diagnosed. Patient was treated with prednisone 60 mg with a taper down to 10 mg a day which continued until July of 2013.  PET scan was ordered to restage the patient in 2015 with findings of abnormality in the right upper lobe of the lung as well as the left axilla. Left axilla biopsy was consistent with follicular lymphoma.  The  patient was evaluated by thoracic surgery for resection of the pulmonary nodule due to its appearance and concern for a primary lung cancer. Wedge resection of the right lower lobe nodule performed on 11/07/5275 revealed follicular lymphoma identical to left axillary biopsy performed on 09/28/2013.   Oncology history below provides updated chronology of events from June 2015 to present.      Non-Hodgkin lymphoma, B-cell, low grade   12/02/2013 Pathology Results Interpretation Tissue-Flow Cytometry MONOCLONAL B CELL POPULATION IDENTIFIED.   12/02/2013 Initial Diagnosis Diagnosis Lung, wedge biopsy/resection, Right lower lobe - LOW GRADE NON-HODGKIN'S B CELL LYMPHOMA   12/31/2013 - 01/21/2014 Chemotherapy Rituxan weekly x 4 cycles   04/15/2014 -  Chemotherapy Maintenance Rituxan x 2 years   08/20/2014 PET scan No findings to suggest residual or recurrent  lymphoma in the neck, chest, abdomen or pelvis.   I personally reviewed and went over laboratory results with the patient.  The results are noted within this dictation.  They will be updated today prior to treatment.  [name] due for Rituxan today.  She denies any complaints including B symptoms and new lumps or bumps.  She has multiple skin lesions and none of which appear worrisome to me.  I have provided her education regarding skin lesion changes that would require follow-up with primary care provider and/or referral to dermatology.  She continues to smoke 1 ppd.  Smoking cessation education provided.  Past Medical History  Diagnosis Date  . Coronary artery disease     status post stenting of the aortic and iliac vessels by Dr. Gwenlyn Found. Repeat  aortic stenting 2010  . Left ventricular dysfunction     hx of with ejection fraction of 35% range.  . Carotid artery disease (HCC)     hx of carotid cerebrovascular disease with 0-39% bilateral internal carotid artery setenosis   . Non Hodgkin's lymphoma ([place])   . Chronic back pain     status post lumbar  surgery  . Hyperlipidemia   . Hypertension   . COPD (chronic obstructive pulmonary disease) ([place])   . Shortness of breath   . Cancer (HCC)     hx of non hogdin lymphoma  . CHF (congestive heart failure) ([place])   . Blood transfusion   . Arthritis   . Myocardial infarction ([place])   . Peripheral vascular disease ([place])   . GERD (gastroesophageal reflux disease)   . Anemia   . Osteoporosis   . Depression   . Anxiety   . Other malignant lymphomas, unspecified site, extranodal and solid organ sites 2004    Back  . Asthma   . Vulvar cancer ([place])   . Anginal pain ([place])     occ last time last wk  . Dysrhythmia     ?  . Stroke ([place]) 04  . Pneumonia     hx  . History of kidney stones   . Seizures ([place])     ? yrs ago  . Lymphocytic lymphoma ([place]) 12/24/2013  . Non-Hodgkin lymphoma, B-cell, low grade 12/24/2013    has HYPERLIPIDEMIA; HYPERTENSION, BENIGN; CAD, NATIVE VESSEL; LEFT VENTRICULAR FUNCTION, DECREASED; CAROTID ARTERY DISEASE; AORTIC ATHEROSCLEROSIS; ATHEROSCLEROSIS, RENAL ARTERY; Atherosclerosis of native arteries of the extremities with rest pain; PVD; COPD; NON-HODGKIN'S LYMPHOMA, HX OF; Vulvar intraepithelial neoplasia III (VIN III); Vulvar cancer ([place]); Langerhans cell histiocytosis of lung, 2009; and Non-Hodgkin lymphoma, B-cell, low grade on her problem list.     is allergic to naproxen.  Current Outpatient Prescriptions on File Prior to Visit  Medication Sig Dispense Refill  . albuterol (VENTOLIN HFA) 108 (90 BASE) MCG/ACT inhaler Inhale 2 puffs into the lungs every 6 (six) hours as needed for wheezing or shortness of breath (wheezing and shortness of breath). For asthma/wheezing    . ALPRAZolam (XANAX) 1 MG tablet Take 1 mg by mouth 3 (three) times daily as needed for anxiety or sleep (sleep).     [name] amitriptyline (ELAVIL) 50 MG tablet Take 50 mg by mouth at bedtime.    [name] aspirin 81 MG EC tablet Take 81 mg by mouth daily.      . Biotin 1 MG CAPS Take by mouth.    [name]  buPROPion (WELLBUTRIN XL) 150 MG 24 hr tablet Take 150 mg by mouth daily.    . clopidogrel (PLAVIX) 75 MG tablet TAKE 1 TABLET DAILY WITH BREAKFAST. 30 tablet 6  . cycloSPORINE (RESTASIS) 0.05 % ophthalmic emulsion Place 1 drop into both eyes 2 (two) times daily.    . diazepam (VALIUM) 5 MG tablet Take 5 mg by mouth every 6 (six) hours as needed for muscle spasms (muscle spasms). For muscle spasms    . furosemide (LASIX) 40 MG tablet Take 40 mg by mouth daily as needed for fluid (fluid).     . gabapentin (NEURONTIN) 300 MG capsule Take 300 mg by mouth at bedtime.    . isosorbide mononitrate (IMDUR) 30 MG 24 hr tablet TAKE 1/2 TABLET BY MOUTH DAILY. 45 tablet 6  . metoprolol (LOPRESSOR) 50 MG tablet TAKE (1/2) TABLET BY MOUTH TWICE DAILY. 30 tablet 5  . mupirocin ointment (BACTROBAN)  2 % Apply 1 application topically daily as needed (leg infection).     . NEXIUM 40 MG capsule Take 40 mg by mouth 2 (two) times daily.     [name] NITROSTAT 0.4 MG SL tablet DISSOLVE 1 TABLET UNDER TONGUE EVERY 5 MINUTES UP TO 15 MINUTES FOR CHEST PAIN. IF NO RELIEF CALL 911. 25 tablet 3  . oxycodone (ROXICODONE) 30 MG immediate release tablet Take 30 mg by mouth every 4 (four) hours as needed for pain (pain). For breakthrough pain    . OXYCONTIN 80 MG T12A 12 hr tablet     . rosuvastatin (CRESTOR) 40 MG tablet Take 40 mg by mouth daily.      [name] sulfamethoxazole-trimethoprim (BACTRIM DS) 800-160 MG per tablet Take 1 tablet by mouth 2 (two) times a week. Takes 1 Tablet By Mouth BID ONLY on Saturdays and Sundays.    . SYMBICORT 160-4.5 MCG/ACT inhaler Inhale 2 puffs into the lungs every 12 (twelve) hours.   11  . vitamin B-12 (CYANOCOBALAMIN) 100 MCG tablet Take 100 mcg by mouth daily.    [name] morphine (MS CONTIN) 100 MG 12 hr tablet Take 1 tablet by mouth 2 (two) times daily.    [name] oxyCODONE-acetaminophen (PERCOCET) 10-325 MG per tablet Take 1 tablet by mouth every 6 (six) hours as needed for pain. (Patient not taking: Reported on  02/08/2015) 60 tablet 0   No current facility-administered medications on file prior to visit.    Past Surgical History  Procedure Laterality Date  . [place] surg    . Back surgery      x 2  . Lung biopsy    . Cardiac catheterization    . Abdominal aortogram  08/08/2011  . Abdominal hysterectomy      partial  . Portacath placement      pt says its for chemo only and its an old port(04)   . Dilation and curettage of uterus    . Vulva /perineum biopsy  04/09/2012    Procedure: VULVAR BIOPSY;  Surgeon: Florian Buff, MD;  Location: [facility];  Service: Gynecology;  Laterality: N/A;  . Vulvectomy  05/20/2012    Procedure: VULVECTOMY;  Surgeon: Alvino Chapel, MD;  Location: [facility];  Service: Gynecology;  Laterality: Bilateral;  BILATERAL SIMPLE VULVECTOMIES  . Video assisted thoracoscopy (vats)/wedge resection Right 12/02/2013    Procedure: VIDEO ASSISTED THORACOSCOPY (VATS)/WEDGE RESECTION;  Surgeon: Melrose Nakayama, MD;  Location: [place];  Service: Thoracic;  Laterality: Right;  . Abdominal aortagram N/A 08/08/2011    Procedure: ABDOMINAL Maxcine Ham;  Surgeon: Sherren Mocha, MD;  Location: [facility] CATH LAB;  Service: Cardiovascular;  Laterality: N/A;  . Percutaneous stent intervention Bilateral 08/08/2011    Procedure: PERCUTANEOUS STENT INTERVENTION;  Surgeon: Sherren Mocha, MD;  Location: [facility] CATH LAB;  Service: Cardiovascular;  Laterality: Bilateral;    Denies any headaches, dizziness, double vision, fevers, chills, night sweats, nausea, vomiting, diarrhea, constipation, chest pain, heart palpitations, shortness of breath, blood in stool, black tarry stool, urinary pain, urinary burning, urinary frequency, hematuria.   PHYSICAL EXAMINATION  ECOG PERFORMANCE STATUS: 1 - Symptomatic but completely ambulatory  Filed Vitals:   04/06/15 0800  BP: 102/35  Pulse: 70  Temp: 98.1 F (36.7 C)  Resp: 18    GENERAL:alert, no distress, comfortable, cooperative, smiling and un accompanied,  appearing chronically ill appearing from effects of severe tobacco abuse. SKIN: positive for: multiple SKs diffusely, thin skin, multiple lower arm ecchymoses, dry skin. HEAD: Normocephalic, No masses, lesions,  tenderness or abnormalities EYES: normal, PERRLA, EOMI, Conjunctiva are pink and non-injected EARS: External ears normal OROPHARYNX:lips, buccal mucosa, and tongue normal and mucous membranes are moist  NECK: supple, no adenopathy, thyroid normal size, non-tender, without nodularity, trachea midline LYMPH:  no palpable lymphadenopathy BREAST:not examined LUNGS: positive findings: rhonchi  diffusely, wheezing  diffusely HEART: regular rate & rhythm, no murmurs and no gallops ABDOMEN:abdomen soft, non-tender and normal bowel sounds BACK: Back symmetric, no curvature., No CVA tenderness EXTREMITIES:less then 2 second capillary refill, no joint deformities, effusion, or inflammation, no edema, no skin discoloration, positive findings:  As outlined in skin exam above.  NEURO: alert & oriented x 3 with fluent speech, no focal motor/sensory deficits, gait normal   LABORATORY DATA: CBC    Component Value Date/Time   WBC 5.7 02/08/2015 0930   RBC 4.51 02/08/2015 0930   RBC 4.50 04/15/2014 0852   HGB 12.9 02/08/2015 0930   HCT 40.3 02/08/2015 0930   PLT 117* 02/08/2015 0930   MCV 89.4 02/08/2015 0930   MCH 28.6 02/08/2015 0930   MCHC 32.0 02/08/2015 0930   RDW 15.6* 02/08/2015 0930   LYMPHSABS 1.2 02/08/2015 0930   MONOABS 0.8 02/08/2015 0930   EOSABS 0.3 02/08/2015 0930   BASOSABS 0.0 02/08/2015 0930      Chemistry      Component Value Date/Time   NA 140 02/08/2015 0930   K 3.6 02/08/2015 0930   CL 99* 02/08/2015 0930   CO2 31 02/08/2015 0930   BUN 19 02/08/2015 0930   CREATININE 1.35* 02/08/2015 0930      Component Value Date/Time   CALCIUM 8.8* 02/08/2015 0930   ALKPHOS 121 02/08/2015 0930   AST 18 02/08/2015 0930   ALT 11* 02/08/2015 0930   BILITOT 0.5 02/08/2015  0930        PENDING LABS:   RADIOGRAPHIC STUDIES:  No results found.   PATHOLOGY:    ASSESSMENT AND PLAN:  Non-Hodgkin lymphoma, B-cell, low grade S/P 4 weekly infusions of Rituxan finishing on 01/21/2014 followed by maintenance Rituxan q 60 days. PET scan performed on 08/20/2014 demonstrates NED.    She has a history of missed Rituxan infusions.  Compliance discussed with patient with discussion regarding goals of treatment.  Labs today: CBC diff, CMET, LDH  Influenza vaccine administered today.  Smoking cessation education provided.  [name] not interested in quitting at this time.  She continue 1 ppd.  Labs in 2 months: CBC diff, CMET, LDH  Return in 2 months for next Rituxan infusion and follow-up appointment.    THERAPY PLAN:  Continue with Rituxan every 2 months x 2 years.  All questions were answered. The patient knows to call the clinic with any problems, questions or concerns. We can certainly see the patient much sooner if necessary.  Patient and plan discussed with Dr. Ancil Linsey and she is in agreement with the aforementioned.   This note is electronically signed by: Doy Mince 04/06/2015 9:31 AM

## 2015-04-06 NOTE — Progress Notes (Signed)
Shan Levans presents today for injection per MD orders. Flu Vaccine administered IM in right Upper Arm. Administration without incident. Patient tolerated well.

## 2015-04-06 NOTE — Assessment & Plan Note (Addendum)
S/P 4 weekly infusions of Rituxan finishing on 01/21/2014 followed by maintenance Rituxan q 60 days. PET scan performed on 08/20/2014 demonstrates NED.    She has a history of missed Rituxan infusions.  Compliance discussed with patient with discussion regarding goals of treatment.  Labs today: CBC diff, CMET, LDH  Influenza vaccine administered today.  Smoking cessation education provided.  [name] not interested in quitting at this time.  She continue 1 ppd.  Labs in 2 months: CBC diff, CMET, LDH  Return in 2 months for next Rituxan infusion and follow-up appointment.

## 2015-04-15 ENCOUNTER — Other Ambulatory Visit: Payer: Self-pay | Admitting: Cardiovascular Disease

## 2015-04-15 DIAGNOSIS — I739 Peripheral vascular disease, unspecified: Secondary | ICD-10-CM

## 2015-04-15 DIAGNOSIS — Z959 Presence of cardiac and vascular implant and graft, unspecified: Secondary | ICD-10-CM

## 2015-04-20 ENCOUNTER — Ambulatory Visit (HOSPITAL_COMMUNITY)
Admission: RE | Admit: 2015-04-20 | Discharge: 2015-04-20 | Disposition: A | Discharge status: Home / Self Care | Payer: Medicare HMO | Source: Ambulatory Visit | Attending: Cardiovascular Disease | Admitting: Cardiovascular Disease

## 2015-04-20 ENCOUNTER — Other Ambulatory Visit: Payer: Self-pay | Admitting: Cardiovascular Disease

## 2015-04-20 ENCOUNTER — Ambulatory Visit (HOSPITAL_BASED_OUTPATIENT_CLINIC_OR_DEPARTMENT_OTHER)
Admission: RE | Admit: 2015-04-20 | Discharge: 2015-04-20 | Disposition: A | Discharge status: Home / Self Care | Payer: Medicare HMO | Source: Ambulatory Visit | Attending: Cardiovascular Disease | Admitting: Cardiovascular Disease

## 2015-04-20 DIAGNOSIS — I10 Essential (primary) hypertension: Secondary | ICD-10-CM | POA: Insufficient documentation

## 2015-04-20 DIAGNOSIS — Z95828 Presence of other vascular implants and grafts: Secondary | ICD-10-CM | POA: Diagnosis not present

## 2015-04-20 DIAGNOSIS — Z959 Presence of cardiac and vascular implant and graft, unspecified: Secondary | ICD-10-CM | POA: Diagnosis not present

## 2015-04-20 DIAGNOSIS — J449 Chronic obstructive pulmonary disease, unspecified: Secondary | ICD-10-CM | POA: Diagnosis not present

## 2015-04-20 DIAGNOSIS — I2510 Atherosclerotic heart disease of native coronary artery without angina pectoris: Secondary | ICD-10-CM | POA: Diagnosis not present

## 2015-04-20 DIAGNOSIS — E785 Hyperlipidemia, unspecified: Secondary | ICD-10-CM | POA: Diagnosis not present

## 2015-04-20 DIAGNOSIS — I739 Peripheral vascular disease, unspecified: Secondary | ICD-10-CM

## 2015-05-04 ENCOUNTER — Encounter: Payer: Self-pay | Admitting: Cardiovascular Disease

## 2015-05-04 NOTE — Telephone Encounter (Signed)
This encounter was created in error - please disregard.

## 2015-05-04 NOTE — Telephone Encounter (Signed)
New problem    Pt returning your call concerning her results.

## 2015-06-06 ENCOUNTER — Encounter (HOSPITAL_BASED_OUTPATIENT_CLINIC_OR_DEPARTMENT_OTHER): Payer: Medicare HMO | Admitting: Hematology & Oncology

## 2015-06-06 ENCOUNTER — Encounter (HOSPITAL_COMMUNITY): Payer: Self-pay | Admitting: Hematology & Oncology

## 2015-06-06 ENCOUNTER — Encounter (HOSPITAL_COMMUNITY): Payer: Medicare HMO | Attending: Oncology

## 2015-06-06 VITALS — BP 97/52 | HR 64 | Temp 97.0°F | Resp 18

## 2015-06-06 VITALS — BP 90/41 | HR 73 | Temp 98.3°F | Resp 22 | Wt 140.4 lb

## 2015-06-06 DIAGNOSIS — C8590 Non-Hodgkin lymphoma, unspecified, unspecified site: Secondary | ICD-10-CM

## 2015-06-06 DIAGNOSIS — C8209 Follicular lymphoma grade I, extranodal and solid organ sites: Secondary | ICD-10-CM

## 2015-06-06 DIAGNOSIS — C8204 Follicular lymphoma grade I, lymph nodes of axilla and upper limb: Secondary | ICD-10-CM

## 2015-06-06 DIAGNOSIS — Z5112 Encounter for antineoplastic immunotherapy: Secondary | ICD-10-CM

## 2015-06-06 DIAGNOSIS — C8510 Unspecified B-cell lymphoma, unspecified site: Secondary | ICD-10-CM | POA: Diagnosis present

## 2015-06-06 DIAGNOSIS — Z720 Tobacco use: Secondary | ICD-10-CM | POA: Diagnosis not present

## 2015-06-06 DIAGNOSIS — Z1239 Encounter for other screening for malignant neoplasm of breast: Secondary | ICD-10-CM

## 2015-06-06 DIAGNOSIS — N183 Chronic kidney disease, stage 3 (moderate): Secondary | ICD-10-CM

## 2015-06-06 DIAGNOSIS — R1032 Left lower quadrant pain: Secondary | ICD-10-CM | POA: Diagnosis not present

## 2015-06-06 DIAGNOSIS — I10 Essential (primary) hypertension: Secondary | ICD-10-CM | POA: Diagnosis not present

## 2015-06-06 DIAGNOSIS — J449 Chronic obstructive pulmonary disease, unspecified: Secondary | ICD-10-CM | POA: Diagnosis not present

## 2015-06-06 DIAGNOSIS — E785 Hyperlipidemia, unspecified: Secondary | ICD-10-CM | POA: Diagnosis not present

## 2015-06-06 LAB — COMPREHENSIVE METABOLIC PANEL
ALBUMIN: 3.2 g/dL — AB (ref 3.5–5.0)
ALT: 19 U/L (ref 14–54)
ANION GAP: 8 (ref 5–15)
AST: 28 U/L (ref 15–41)
Alkaline Phosphatase: 106 U/L (ref 38–126)
BILIRUBIN TOTAL: 0.3 mg/dL (ref 0.3–1.2)
BUN: 17 mg/dL (ref 6–20)
CHLORIDE: 98 mmol/L — AB (ref 101–111)
CO2: 29 mmol/L (ref 22–32)
Calcium: 8.6 mg/dL — ABNORMAL LOW (ref 8.9–10.3)
Creatinine, Ser: 1.29 mg/dL — ABNORMAL HIGH (ref 0.44–1.00)
GFR calc Af Amer: 52 mL/min — ABNORMAL LOW (ref >60)
GFR calc non Af Amer: 45 mL/min — ABNORMAL LOW (ref >60)
GLUCOSE: 118 mg/dL — AB (ref 65–99)
POTASSIUM: 3.5 mmol/L (ref 3.5–5.1)
Sodium: 135 mmol/L (ref 135–145)
TOTAL PROTEIN: 6.5 g/dL (ref 6.5–8.1)

## 2015-06-06 LAB — CBC WITH DIFFERENTIAL/PLATELET
BASOS PCT: 0 %
Basophils Absolute: 0 10*3/uL (ref 0.0–0.1)
EOS ABS: 0.2 10*3/uL (ref 0.0–0.7)
EOS PCT: 2 %
HCT: 35.5 % — ABNORMAL LOW (ref 36.0–46.0)
Hemoglobin: 12 g/dL (ref 12.0–15.0)
Lymphocytes Relative: 15 %
Lymphs Abs: 1.1 10*3/uL (ref 0.7–4.0)
MCH: 31 pg (ref 26.0–34.0)
MCHC: 33.8 g/dL (ref 30.0–36.0)
MCV: 91.7 fL (ref 78.0–100.0)
MONO ABS: 0.8 10*3/uL (ref 0.1–1.0)
MONOS PCT: 11 %
NEUTROS PCT: 72 %
Neutro Abs: 5.1 10*3/uL (ref 1.7–7.7)
PLATELETS: 146 10*3/uL — AB (ref 150–400)
RBC: 3.87 MIL/uL (ref 3.87–5.11)
RDW: 15.9 % — AB (ref 11.5–15.5)
WBC: 7.1 10*3/uL (ref 4.0–10.5)

## 2015-06-06 LAB — LACTATE DEHYDROGENASE: LDH: 175 U/L (ref 98–192)

## 2015-06-06 MED ORDER — DIPHENHYDRAMINE HCL 25 MG PO CAPS
50.0000 mg | ORAL_CAPSULE | Freq: Once | ORAL | Status: AC
  Administered 2015-06-06: 50 mg via ORAL

## 2015-06-06 MED ORDER — CIPROFLOXACIN HCL 500 MG PO TABS: 500.0000 mg | ORAL_TABLET | Freq: Two times a day (BID) | ORAL | Status: DC

## 2015-06-06 MED ORDER — ACETAMINOPHEN 325 MG PO TABS
ORAL_TABLET | ORAL | Status: AC
  Filled 2015-06-06: qty 2

## 2015-06-06 MED ORDER — METRONIDAZOLE 500 MG PO TABS
500.0000 mg | ORAL_TABLET | Freq: Once | ORAL | Status: AC
  Administered 2015-06-06: 500 mg via ORAL
  Filled 2015-06-06: qty 1

## 2015-06-06 MED ORDER — SODIUM CHLORIDE 0.9 % IJ SOLN
10.0000 mL | INTRAMUSCULAR | Status: DC | PRN
  Administered 2015-06-06: 10 mL
  Filled 2015-06-06: qty 10

## 2015-06-06 MED ORDER — METRONIDAZOLE 500 MG PO TABS: 500.0000 mg | ORAL_TABLET | Freq: Three times a day (TID) | ORAL | Status: DC

## 2015-06-06 MED ORDER — DIPHENHYDRAMINE HCL 25 MG PO CAPS
ORAL_CAPSULE | ORAL | Status: AC
  Filled 2015-06-06: qty 2

## 2015-06-06 MED ORDER — SODIUM CHLORIDE 0.9 % IV SOLN
900.0000 mg | Freq: Once | INTRAVENOUS | Status: AC
  Administered 2015-06-06: 900 mg via INTRAVENOUS
  Filled 2015-06-06: qty 90

## 2015-06-06 MED ORDER — SODIUM CHLORIDE 0.9 % IV SOLN
Freq: Once | INTRAVENOUS | Status: AC
  Administered 2015-06-06: 09:00:00 via INTRAVENOUS

## 2015-06-06 MED ORDER — CIPROFLOXACIN HCL 500 MG PO TABS
500.0000 mg | ORAL_TABLET | Freq: Once | ORAL | Status: AC
  Administered 2015-06-06: 500 mg via ORAL
  Filled 2015-06-06: qty 1

## 2015-06-06 MED ORDER — HEPARIN SOD (PORK) LOCK FLUSH 100 UNIT/ML IV SOLN
500.0000 [IU] | Freq: Once | INTRAVENOUS | Status: AC | PRN
  Administered 2015-06-06: 500 [IU]
  Filled 2015-06-06: qty 5

## 2015-06-06 NOTE — Progress Notes (Signed)
Per patient she took 2 Tylenol at home at apx 730 am today.  Tolerated Rituxan infusion well. Ambulatory on discharge home to self.

## 2015-06-06 NOTE — Progress Notes (Signed)
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    In September of 2004 the patient presented with a several month history of left-sided back pain and was seen in the emergency room at which time a paravertebral left-sided mass at T3 was seen. A core biopsy was consistent with follicular non-Hodgkin's lymphoma with some elements of large cell. PET CT scan revealed mediastinal lymph nodes. Treated with 4 cycles of CHOP-R. started on 03/22/2003 through 05/25/2003 with vincristine dose decreased for the last 2 cycles due to peripheral neuropathy. She received Rituxan maintenance at 2 month intervals for one year and beginning in March of 2006 Rituxan was given at 3 month intervals with treatment ending in December 2006.  She relapsed with a mass at T7 presenting again with back pain at that time treated with Cytoxan and Fludara along with Rituxan starting in July of 2009. Because of myelotoxicity Cytoxan was held for one cycle with repeat study showing no evidence of disease but with persistent thrombocytopenia in the 50-80,000 range. Her paraspinal mass disappeared.. Repeat CT scans on 03/10/2012 showed no evidence of lymphoma.  Sometime in 2009, the patient developed pulmonary lesions and was operated on by Dr. Arlyce Dice at which time Langerhans histiocytosis was diagnosed. Patient was treated with prednisone tapering him 60 mg down to 10 mg a day continued till July of 2013.  PET scan was ordered  to restage the patient in 2015 with findings of abnormality in the right upper lobe of the lung as well as the left axilla. Left axilla biopsy was consistent with follicular lymphoma and the patient is currently being evaluated by thoracic surgery for resection of the pulmonary nodule which appears to be a primary lung cancer. Wedge resection of the right lower lobe nodule performed on 123456 revealed follicular lymphoma identical to left axillary biopsy performed on 09/28/2013   Oncology History   InSeptember of 2004 the patient presented with a several month history of left-sided back pain and was seen in the emergency room at which time a paravertebral left-sided mass at T3 was seen. A core biopsy was consistent with follicular non-Hodgkin's lymphoma with some elements of large cell. PET CT scan revealed mediastinal lymph nodes. She was treated with 4 cycles of CHOP-R starting on 03/22/2003 through 05/25/2003 with vincristine dose-reduced for the last 2 cycles due to peripheral neuropathy. She received Rituxan maintenance at 2 month intervals for one year and beginning in March of 2006 Rituxan was given at 3 month intervals with treatment ending in December 2006.  She relapsed with a mass at T7 presenting again with back pain.  This time, she was treated with Cytoxan and Fludara + Rituxan starting in July 2009. Because of myelotoxicity Cytoxan was held for one cycle with repeat study showing no evidence of disease but with persistent thrombocytopenia in the 50-80,000 range. Her paraspinal mass disappeared.. Repeat CT scans on 03/10/2012 showed no evidence of lymphoma.  Sometime in 2009, the patient developed pulmonary lesions and was operated on by Dr. Arlyce Dice at which time  Langerhans histiocytosis was diagnosed. Patient was treated with prednisone 60 mg with a taper down to 10 mg a day which continued until July of 2013.  PET scan was ordered to restage the patient in 2015 with findings of abnormality in  the right upper lobe of the lung as well as the left axilla. Left axilla biopsy was consistent with follicular lymphoma.  The patient was evaluated by thoracic surgery for resection of the pulmonary nodule due to its appearance and concern for a primary lung cancer. Wedge resection of the right lower lobe nodule performed on [id number] revealed follicular lymphoma identical to left axillary biopsy performed on 09/28/2013.   Oncology history below provides updated chronology of events from June 2015 to present.      Follicular lymphoma grade I of extranodal and solid organ sites [facility])   12/02/2013 Pathology Results Interpretation Tissue-Flow Cytometry MONOCLONAL B CELL POPULATION IDENTIFIED.   12/02/2013 Initial Diagnosis Diagnosis Lung, wedge biopsy/resection, Right lower lobe - LOW GRADE NON-HODGKIN'S B CELL LYMPHOMA   12/31/2013 - 01/21/2014 Chemotherapy Rituxan weekly x 4 cycles   04/15/2014 -  Chemotherapy Maintenance Rituxan x 2 years   08/20/2014 PET scan No findings to suggest residual or recurrent  lymphoma in the neck, chest, abdomen or pelvis.    [name] is here alone today.  When asked, she remarks that she's still smoking, but "not as bas it was." She says if she goes somewhere and knows she can't smoke, then she won't smoke, " and I don't even think about it." She says she wouldn't smoke if she had something to do. With regards to her inactivity, she says "my heart won't let me, I guess." She says she'd like to be able to exercise but can't do that. When she exercises, she says she will "black out." She said she received a stress test "laying down," and she had one of those black out spells then, too. She had the stress test done at [facility] in [place].  She comments that she's been hurting in her lower left abdomen. She states that this pain probably started a couple of weeks ago. She reports no change in her bowel movements.  She denies any blood in her stool, but confirms that she had a  fever in October. It measured about 102 and lasted about four days. She says that her abdominal pain is there most of the time, but it's not hurting this morning. When asked what makes it better, she says she really doesn't know. "It might be my pain pills, I'm not really sure."  She says she's been eating okay but that she can't eat much. "Ain't got no appetite, I guess."  She has not had a colonoscopy. She says if she's ever seen a GI doctor, it was years ago, "like in my twenties years ago."  She uses Personal assistant. [name] allergic to morphine.  She has had her flu shot.   Past Medical History  Diagnosis Date  . Coronary artery disease     status post stenting of the aortic and iliac vessels by Dr. Gwenlyn Found. Repeat aortic stenting 2010  . Left ventricular dysfunction     hx of with ejection fraction of 35% range.  . Carotid artery disease (HCC)     hx of carotid cerebrovascular disease with 0-39% bilateral internal carotid artery setenosis   . Non Hodgkin's lymphoma ([place])   . Chronic back pain     status post lumbar surgery  . Hyperlipidemia   .  Hypertension   . COPD (chronic obstructive pulmonary disease) ([place])   . Shortness of breath   . Cancer (HCC)     hx of non hogdin lymphoma  . CHF (congestive heart failure) ([place])   . Blood transfusion   . Arthritis   . Myocardial infarction ([place])   . Peripheral vascular disease ([place])   . GERD (gastroesophageal reflux disease)   . Anemia   . Osteoporosis   . Depression   . Anxiety   . Other malignant lymphomas, unspecified site, extranodal and solid organ sites 2004    Back  . Asthma   . Vulvar cancer ([place])   . Anginal pain ([place])     occ last time last wk  . Dysrhythmia     ?  . Stroke ([place]) 04  . Pneumonia     hx  . History of kidney stones   . Seizures ([place])     ? yrs ago  . Lymphocytic lymphoma ([place]) 12/24/2013  . Non-Hodgkin lymphoma, B-cell, low grade 12/24/2013    has HYPERLIPIDEMIA; HYPERTENSION, BENIGN; CAD,  NATIVE VESSEL; LEFT VENTRICULAR FUNCTION, DECREASED; CAROTID ARTERY DISEASE; AORTIC ATHEROSCLEROSIS; ATHEROSCLEROSIS, RENAL ARTERY; Atherosclerosis of native arteries of the extremities with rest pain; PVD; COPD; NON-HODGKIN'S LYMPHOMA, HX OF; Vulvar intraepithelial neoplasia III (VIN III); Vulvar cancer ([place]); Langerhans cell histiocytosis of lung, [id number]; and Follicular lymphoma grade I of extranodal and solid organ sites [facility]) on her problem list.     is allergic to naproxen.  [name] does not currently have medications on file.  Past Surgical History  Procedure Laterality Date  . [place] surg    . Back surgery      x 2  . Lung biopsy    . Cardiac catheterization    . Abdominal aortogram  08/08/2011  . Abdominal hysterectomy      partial  . Portacath placement      pt says its for chemo only and its an old port(04)   . Dilation and curettage of uterus    . Vulva /perineum biopsy  04/09/2012    Procedure: VULVAR BIOPSY;  Surgeon: Florian Buff, MD;  Location: [facility];  Service: Gynecology;  Laterality: N/A;  . Vulvectomy  05/20/2012    Procedure: VULVECTOMY;  Surgeon: Alvino Chapel, MD;  Location: [facility];  Service: Gynecology;  Laterality: Bilateral;  BILATERAL SIMPLE VULVECTOMIES  . Video assisted thoracoscopy (vats)/wedge resection Right 12/02/2013    Procedure: VIDEO ASSISTED THORACOSCOPY (VATS)/WEDGE RESECTION;  Surgeon: Melrose Nakayama, MD;  Location: [place];  Service: Thoracic;  Laterality: Right;  . Abdominal aortagram N/A 08/08/2011    Procedure: ABDOMINAL Maxcine Ham;  Surgeon: Sherren Mocha, MD;  Location: [facility] CATH LAB;  Service: Cardiovascular;  Laterality: N/A;  . Percutaneous stent intervention Bilateral 08/08/2011    Procedure: PERCUTANEOUS STENT INTERVENTION;  Surgeon: Sherren Mocha, MD;  Location: [facility] CATH LAB;  Service: Cardiovascular;  Laterality: Bilateral;  Smoker, 1 ppd. She reports that she cannot quit, she has tried several times without success. She began  smoking at 59 yo.  Denies any headaches, dizziness, double vision, fevers, chills, night sweats, nausea, vomiting, diarrhea, constipation, chest pain, heart palpitations, shortness of breath, blood in stool, black tarry stool, urinary pain, urinary burning, urinary frequency, hematuria. Positive for neuropathy and memory loss.      Neuropathy in her feet. Memory loss due to previous stroke. Positive for abdominal pain.  PHYSICAL EXAMINATION  ECOG PERFORMANCE STATUS: 1 - Symptomatic but completely ambulatory  Filed Vitals:  06/06/15 0829  BP: 90/41  Pulse: 73  Temp: 98.3 F (36.8 C)  Resp: 22   GENERAL:alert, no distress, well nourished, well developed, comfortable, cooperative, smiling and chronically ill appearing SKIN: skin color, texture, turgor are normal, no rashes or significant lesions HEAD: Normocephalic, No masses, lesions, tenderness or abnormalities EYES: normal, PERRLA, EOMI, Conjunctiva are pink and non-injected EARS: External ears normal OROPHARYNX:lips, buccal mucosa, and tongue normal and mucous membranes are moist  NECK: supple, thyroid normal size, non-tender, without nodularity, no stridor, non-tender, trachea midline LYMPH:  no palpable lymphadenopathy BREAST:not examined LUNGS: clear to auscultation and percussion, decreased breath sounds, rare exp. wheeze HEART: regular rate & rhythm, no murmurs, no gallops, S1 normal and S2 normal ABDOMEN:abdomen soft, pain with rebound LLQ, no rebound, no guarding but patient notes discomfort. No HSM BACK: Back symmetric, no curvature., No CVA tenderness EXTREMITIES:less then 2 second capillary refill, no joint deformities, effusion, or inflammation, no edema, no skin discoloration, no clubbing, no cyanosis     Right leg edema. NEURO: alert & oriented x 3 with fluent speech, no focal motor/sensory deficits   LABORATORY DATA: I have reviewed the data as listed.  CBC    Component Value Date/Time   WBC 7.1 06/06/2015  0821   RBC 3.87 06/06/2015 0821   RBC 4.50 04/15/2014 0852   HGB 12.0 06/06/2015 0821   HCT 35.5* 06/06/2015 0821   PLT 146* 06/06/2015 0821   MCV 91.7 06/06/2015 0821   MCH 31.0 06/06/2015 0821   MCHC 33.8 06/06/2015 0821   RDW 15.9* 06/06/2015 0821   LYMPHSABS 1.1 06/06/2015 0821   MONOABS 0.8 06/06/2015 0821   EOSABS 0.2 06/06/2015 0821   BASOSABS 0.0 06/06/2015 0821      Chemistry      Component Value Date/Time   NA 135 06/06/2015 0821   K 3.5 06/06/2015 0821   CL 98* 06/06/2015 0821   CO2 29 06/06/2015 0821   BUN 17 06/06/2015 0821   CREATININE 1.29* 06/06/2015 0821      Component Value Date/Time   CALCIUM 8.6* 06/06/2015 0821   ALKPHOS 106 06/06/2015 0821   AST 28 06/06/2015 0821   ALT 19 06/06/2015 0821   BILITOT 0.3 06/06/2015 0821     RADIOLOGY Study Result     CLINICAL DATA: Screening.  EXAM: DIGITAL SCREENING BILATERAL MAMMOGRAM WITH 3D TOMO WITH CAD  COMPARISON: Previous exam(s).  ACR Breast Density Category a: The breast tissue is almost entirely fatty.  FINDINGS: There are no findings suspicious for malignancy. Images were processed with CAD.  IMPRESSION: No mammographic evidence of malignancy. A result letter of this screening mammogram will be mailed directly to the patient.  RECOMMENDATION: Screening mammogram in one year. (Code:SM-B-01Y)  BI-RADS CATEGORY 1: Negative.   Electronically Signed  By: Franki Cabot M.D.  On: 02/14/2015 17:30    ASSESSMENT AND PLAN:  NHL on maintenance Rituxan Vulvar carinoma, microinvasive squamous cell carcinoma Hematochezia no history of C -scope Tobacco Abuse CKD, Stage III Abdominal pain  I have reviewed the patient's extensive history in regards to her non-Hodgkin's lymphoma. She has no evidence of obvious recurrence.  [name] on maintenance Rituxan and we will continue.  Given her current GI complaints I have recommended GI referral to which she agrees. I have discussed the  importance of screening colonoscopy in the past. I am ordering a CT of the abdomen given her complaints and PE. She will be called in a prescription for cipro and flagyl.   I again  discussed the importance of smoking cessation. She does not feel able to quit and is currently not willing to discuss methods to stop smoking.   She did get her screening mammogram. [name] good through August 2017.   We will see her back in two months. She will be apprised of the results of her CT scan when available.   Orders Placed This Encounter  Procedures  . CT Abdomen Pelvis W Contrast    Standing Status: Future     Number of Occurrences:      Standing Expiration Date: 06/05/2016    Order Specific Question:  If indicated for the ordered procedure, I authorize the administration of contrast media per Radiology protocol    Answer:  Yes    Order Specific Question:  Reason for Exam (SYMPTOM  OR DIAGNOSIS REQUIRED)    Answer:  LLQ abdominal pain, history lymphoma    Order Specific Question:  Is the patient pregnant?    Answer:  No    Order Specific Question:  Preferred imaging location?    Answer:  [facility]   All questions were answered. The patient knows to call the clinic with any problems, questions or concerns. We can certainly see the patient much sooner if necessary.  This document serves as a record of services personally performed by Ancil Linsey, MD. It was created on her behalf by Toni Amend, a trained medical scribe. The creation of this record is based on the scribe's personal observations and the provider's statements to them. This document has been checked and approved by the attending provider.  I have reviewed the above documentation for accuracy and completeness, and I agree with the above.  This note is electronically signed by: Molli Hazard, MD  06/06/2015 9:21 AM

## 2015-06-06 NOTE — Patient Instructions (Addendum)
[place] at [facility] Discharge Instructions  RECOMMENDATIONS MADE BY THE CONSULTANT AND ANY TEST RESULTS WILL BE SENT TO YOUR REFERRING PHYSICIAN.  Exam completed by Dr Whitney Muse today Rituxan today Rituxan every 2 months CT scan of abd/pelvis this week GI referral, they will call you with this appt. We are going to send you in 2 antibiotics please pick these up and make sure you take all of these. Return to see the doctor in 2 months  Please call the clinic if you have any questions or concerns  Thank you for choosing [place] at [facility] to provide your oncology and hematology care.  To afford each patient quality time with our provider, please arrive at least 15 minutes before your scheduled appointment time.    You need to re-schedule your appointment should you arrive 10 or more minutes late.  We strive to give you quality time with our providers, and arriving late affects you and other patients whose appointments are after yours.  Also, if you no show three or more times for appointments you may be dismissed from the clinic at the providers discretion.     Again, thank you for choosing [facility].  Our hope is that these requests will decrease the amount of time that you wait before being seen by our physicians.       _____________________________________________________________  Should you have questions after your visit to [facility], please contact our office at [phone] between the hours of 8:30 a.m. and 4:30 p.m.  Voicemails left after 4:30 p.m. will not be returned until the following business day.  For prescription refill requests, have your pharmacy contact our office.

## 2015-06-09 ENCOUNTER — Ambulatory Visit (HOSPITAL_COMMUNITY)
Admission: RE | Admit: 2015-06-09 | Discharge: 2015-06-09 | Disposition: A | Discharge status: Home / Self Care | Payer: Medicare HMO | Source: Ambulatory Visit | Attending: Hematology & Oncology | Admitting: Hematology & Oncology

## 2015-06-09 DIAGNOSIS — R1032 Left lower quadrant pain: Secondary | ICD-10-CM | POA: Diagnosis present

## 2015-06-09 DIAGNOSIS — C8209 Follicular lymphoma grade I, extranodal and solid organ sites: Secondary | ICD-10-CM

## 2015-06-09 DIAGNOSIS — Z8572 Personal history of non-Hodgkin lymphomas: Secondary | ICD-10-CM | POA: Diagnosis not present

## 2015-06-09 MED ORDER — IOHEXOL 300 MG/ML  SOLN
80.0000 mL | Freq: Once | INTRAMUSCULAR | Status: AC | PRN
  Administered 2015-06-09: 100 mL via INTRAVENOUS

## 2015-06-13 ENCOUNTER — Encounter (INDEPENDENT_AMBULATORY_CARE_PROVIDER_SITE_OTHER): Payer: Self-pay | Admitting: *Deleted

## 2015-06-23 ENCOUNTER — Telehealth (HOSPITAL_COMMUNITY): Payer: Self-pay

## 2015-06-23 NOTE — Telephone Encounter (Signed)
Patient notified

## 2015-06-23 NOTE — Telephone Encounter (Signed)
No evidence of recurrent disease  KEFALAS,THOMAS, PA-C 06/23/2015 3:39 PM

## 2015-06-23 NOTE — Telephone Encounter (Signed)
Call from patient requesting results of scan done on 06/09/15.  Can be reached [phone].

## 2015-07-06 ENCOUNTER — Other Ambulatory Visit: Payer: Self-pay | Admitting: Cardiovascular Disease

## 2015-07-15 ENCOUNTER — Ambulatory Visit (INDEPENDENT_AMBULATORY_CARE_PROVIDER_SITE_OTHER): Payer: Medicare HMO | Admitting: Cardiovascular Disease

## 2015-07-15 ENCOUNTER — Encounter: Payer: Self-pay | Admitting: Cardiovascular Disease

## 2015-07-15 VITALS — BP 96/58 | HR 76 | Ht 64.0 in | Wt 129.4 lb

## 2015-07-15 DIAGNOSIS — I10 Essential (primary) hypertension: Secondary | ICD-10-CM | POA: Diagnosis not present

## 2015-07-15 DIAGNOSIS — Z959 Presence of cardiac and vascular implant and graft, unspecified: Secondary | ICD-10-CM

## 2015-07-15 DIAGNOSIS — E785 Hyperlipidemia, unspecified: Secondary | ICD-10-CM | POA: Diagnosis not present

## 2015-07-15 DIAGNOSIS — I739 Peripheral vascular disease, unspecified: Secondary | ICD-10-CM | POA: Diagnosis not present

## 2015-07-15 NOTE — Patient Instructions (Addendum)
Medication Instructions:  No medication changes today  Labwork: None today.  Testing/Procedures: Your physician has requested that you have a lower extremity arterial duplex. This test is an ultrasound of the arteries in the legs. It looks at arterial blood flow in the legs. Allow one hour for Lower Arterial scans. There are no restrictions or special instructions October 2017  Your physician has requested that you schedule an aorta-iliac duplex scan in October 2017   Follow-Up: Your physician wants you to follow-up in: 1 year with Dr Burt Knack. (Janaury 2018). You will receive a reminder letter in the mail two months in advance. If you don't receive a letter, please call our office to schedule the follow-up appointment.

## 2015-07-15 NOTE — Progress Notes (Signed)
Cardiology Office Note Date:  07/15/2015   ID:  [name], DOB Jun 08, 1956, MRN [id number]  PCP:  [name], MD  Cardiologist:  Sherren Mocha, MD    No chief complaint on file.    History of Present Illness: [name] is a 60 y.o. female who presents for The patient has been followed for severe peripheral arterial disease. She was treated with bilateral iliac stents (iCast) in 2013 after presenting with critical limb ischemia on the right. She has been a long-term smoker. She also has coronary artery disease with chronic occlusion of the RCA and left to right collaterals. She has also had non-Hodgkin's lymphoma. In 2015 she had a new lung lesion and underwent wedge resection that had pathology consistent with her known non-Hodgkin's lymphoma. A preoperative nuclear scan demonstrated inferior wall scar consistent with her known coronary anatomy. There was no significant ischemia.  She reports no change in symptoms since I saw her last year. States that her legs are "doing good." She denies claudication symptoms. [name] not physically active. She has rare episodes of chest pain but no change in pattern or frequency. No exertional chest pain. Symptoms are fleeting and she has not required nitroglycerin. The patient has chronic dyspnea and continues to smoke. She denies orthopnea or PND.  Past Medical History  Diagnosis Date  . Coronary artery disease     status post stenting of the aortic and iliac vessels by Dr. Gwenlyn Found. Repeat aortic stenting 2010  . Left ventricular dysfunction     hx of with ejection fraction of 35% range.  . Carotid artery disease (HCC)     hx of carotid cerebrovascular disease with 0-39% bilateral internal carotid artery setenosis   . Non Hodgkin's lymphoma ([place])   . Chronic back pain     status post lumbar surgery  . Hyperlipidemia   . Hypertension   . COPD (chronic obstructive pulmonary disease) ([place])   . Shortness of breath   . Cancer  (HCC)     hx of non hogdin lymphoma  . CHF (congestive heart failure) ([place])   . Blood transfusion   . Arthritis   . Myocardial infarction ([place])   . Peripheral vascular disease ([place])   . GERD (gastroesophageal reflux disease)   . Anemia   . Osteoporosis   . Depression   . Anxiety   . Other malignant lymphomas, unspecified site, extranodal and solid organ sites 2004    Back  . Asthma   . Vulvar cancer ([place])   . Anginal pain ([place])     occ last time last wk  . Dysrhythmia     ?  . Stroke ([name]) 04  . Pneumonia     hx  . History of kidney stones   . Seizures ([place])     ? yrs ago  . Lymphocytic lymphoma ([place]) 12/24/2013  . Non-Hodgkin lymphoma, B-cell, low grade 12/24/2013    Past Surgical History  Procedure Laterality Date  . [place] surg    . Back surgery      x 2  . Lung biopsy    . Cardiac catheterization    . Abdominal aortogram  08/08/2011  . Abdominal hysterectomy      partial  . Portacath placement      pt says its for chemo only and its an old port(04)   . Dilation and curettage of uterus    . Vulva /perineum biopsy  04/09/2012    Procedure: VULVAR BIOPSY;  Surgeon:  Florian Buff, MD;  Location: [facility];  Service: Gynecology;  Laterality: N/A;  . Vulvectomy  05/20/2012    Procedure: VULVECTOMY;  Surgeon: Alvino Chapel, MD;  Location: [facility];  Service: Gynecology;  Laterality: Bilateral;  BILATERAL SIMPLE VULVECTOMIES  . Video assisted thoracoscopy (vats)/wedge resection Right 12/02/2013    Procedure: VIDEO ASSISTED THORACOSCOPY (VATS)/WEDGE RESECTION;  Surgeon: Melrose Nakayama, MD;  Location: [facility];  Service: Thoracic;  Laterality: Right;  . Abdominal aortagram N/A 08/08/2011    Procedure: ABDOMINAL Maxcine Ham;  Surgeon: Sherren Mocha, MD;  Location: [facility] CATH LAB;  Service: Cardiovascular;  Laterality: N/A;  . Percutaneous stent intervention Bilateral 08/08/2011    Procedure: PERCUTANEOUS STENT INTERVENTION;  Surgeon: Sherren Mocha, MD;  Location: [facility] CATH LAB;   Service: Cardiovascular;  Laterality: Bilateral;    Current Outpatient Prescriptions  Medication Sig Dispense Refill  . albuterol (VENTOLIN HFA) 108 (90 BASE) MCG/ACT inhaler Inhale 2 puffs into the lungs every 6 (six) hours as needed for wheezing or shortness of breath (wheezing and shortness of breath). For asthma/wheezing    . ALPRAZolam (XANAX) 1 MG tablet Take 1 mg by mouth 3 (three) times daily as needed for anxiety or sleep (sleep).     [name] amitriptyline (ELAVIL) 50 MG tablet Take 50 mg by mouth at bedtime.    [name] aspirin 81 MG EC tablet Take 81 mg by mouth daily.      . Biotin 1 MG CAPS Take 1 tablet by mouth daily.     [name] buPROPion (WELLBUTRIN XL) 150 MG 24 hr tablet Take 150 mg by mouth daily.    . ciprofloxacin (CIPRO) 500 MG tablet Take 1 tablet (500 mg total) by mouth 2 (two) times daily. 14 tablet 0  . clopidogrel (PLAVIX) 75 MG tablet TAKE 1 TABLET DAILY WITH BREAKFAST. 30 tablet 6  . cycloSPORINE (RESTASIS) 0.05 % ophthalmic emulsion Place 1 drop into both eyes 2 (two) times daily.    . diazepam (VALIUM) 5 MG tablet Take 5 mg by mouth every 6 (six) hours as needed for muscle spasms (muscle spasms). For muscle spasms    . furosemide (LASIX) 40 MG tablet Take 40 mg by mouth daily as needed for fluid (fluid).     . isosorbide mononitrate (IMDUR) 30 MG 24 hr tablet TAKE 1/2 TABLET BY MOUTH DAILY. 45 tablet 6  . metoprolol (LOPRESSOR) 50 MG tablet TAKE (1/2) TABLET BY MOUTH TWICE DAILY. 30 tablet 10  . mupirocin ointment (BACTROBAN) 2 % Apply 1 application topically daily as needed (leg infection).     . NEXIUM 40 MG capsule Take 40 mg by mouth daily.     [name] NITROSTAT 0.4 MG SL tablet DISSOLVE 1 TABLET UNDER TONGUE EVERY 5 MINUTES UP TO 15 MINUTES FOR CHEST PAIN. IF NO RELIEF CALL 911. 25 tablet 3  . oxycodone (ROXICODONE) 30 MG immediate release tablet Take 30 mg by mouth every 4 (four) hours as needed for pain (pain). For breakthrough pain    . OXYCONTIN 80 MG T12A 12 hr tablet Take 80  mg by mouth every 6 (six) hours.     . rosuvastatin (CRESTOR) 40 MG tablet Take 40 mg by mouth daily.      [name] sulfamethoxazole-trimethoprim (BACTRIM DS) 800-160 MG per tablet Take 1 tablet by mouth 2 (two) times a week. Takes 1 Tablet By Mouth BID ONLY on Saturdays and Sundays.    . SYMBICORT 160-4.5 MCG/ACT inhaler Inhale 2 puffs into the lungs every 12 (twelve) hours.  11  . vitamin B-12 (CYANOCOBALAMIN) 100 MCG tablet Take 100 mcg by mouth daily.     No current facility-administered medications for this visit.    Allergies:   Naproxen   Social History:  The patient  reports that she has been smoking Cigarettes.  She has a 100 pack-year smoking history. She has never used smokeless tobacco. She reports that she does not drink alcohol or use illicit drugs.   Family History:  The patient's  family history includes Aortic aneurysm in her sister; Asthma in her son; Diabetes in her sister; Endometriosis in her daughter; Heart attack in her brother, father, and mother; Heart disease in her father and mother; Heart failure in her father and mother; Kidney cancer in her father; Lung cancer in her mother; Sleep apnea in her son.    ROS:  Please see the history of present illness.  Otherwise, review of systems is positive for weight loss, decreased appetite, chills, leg swelling, visual changes, cough, fatigue, excessive sweating.  All other systems are reviewed and negative.    PHYSICAL EXAM: VS:  BP 96/58 mmHg  Pulse 76  Ht 5\' 4"  (1.626 m)  Wt 129 lb 6.4 oz (58.695 kg)  BMI 22.20 kg/m2 , BMI Body mass index is 22.2 kg/(m^2). GEN: chronically ill-appearing woman, in no acute distress HEENT: normal Neck: no JVD, no masses. No carotid bruits Cardiac: RRR without murmur or gallop                Respiratory:  clear to auscultation bilaterally, normal work of breathing GI: soft, nontender, nondistended, + BS MS: no deformity or atrophy Ext: no pretibial edema, palpable femoral pulses bilaterally  2+ right, 1+ left, bilateral femoral bruits Skin: warm and dry, diffuse ecchymoses Neuro:  Strength and sensation are intact Psych: euthymic mood, full affect  EKG:  EKG is ordered today. The ekg ordered today shows normal sinus rhythm 76 bpm, rightward axis, nonspecific ST abnormality.  Recent Labs: 08/12/2014: B Natriuretic Peptide 59.9 06/06/2015: ALT 19; BUN 17; Creatinine, Ser 1.29*; Hemoglobin 12.0; Platelets 146*; Potassium 3.5; Sodium 135   Lipid Panel  No results found for: CHOL, TRIG, HDL, CHOLHDL, VLDL, LDLCALC, LDLDIRECT    Wt Readings from Last 3 Encounters:  07/15/15 129 lb 6.4 oz (58.695 kg)  06/06/15 140 lb 6.4 oz (63.685 kg)  04/06/15 143 lb 4.8 oz (65 kg)     ASSESSMENT AND PLAN: 1.  Lower extremity peripheral arterial disease: Stable vascular ultrasound studies from October 2016 are reviewed. ABIs are 0.82 and 0.86 on the right and left, respectively. Velocities are increased in the distal aorta and bilateral iliac arteries with peak velocities of 295 on the right and 305 cm/s on the left. She should continue on long-term dual antiplatelet therapy as tolerated. Tobacco cessation advised but she has never been able to quit despite multiple severe medical problems related to long-standing tobacco use.  2. Coronary artery disease, native vessel, without symptoms of angina: Continue current medications.  3. Hyperlipidemia: continue crestor 40 mg daily.  Current medicines are reviewed with the patient today.  The patient does not have concerns regarding medicines.  Labs/ tests ordered today include:  No orders of the defined types were placed in this encounter.   Disposition:   FU one year with Doppler studies prior to the visit  Signed, Sherren Mocha, MD  07/15/2015 9:45 AM    [facility] [address], [place], [place]  [address] Phone: [phone]; Fax: [phone]   

## 2015-07-18 ENCOUNTER — Ambulatory Visit (INDEPENDENT_AMBULATORY_CARE_PROVIDER_SITE_OTHER): Payer: Medicare HMO | Admitting: Internal Medicine

## 2015-08-07 NOTE — Progress Notes (Signed)
Tina Bogus, MD [address] [place] [id number]  Follicular lymphoma grade I of extranodal and solid organ sites [facility])  Hypoxia - Plan: DG Chest 2 View, ipratropium-albuterol (DUONEB) 0.5-2.5 (3) MG/3ML nebulizer solution 3 mL  Respiratory infection - Plan: DG Chest 2 View  Breast cancer screening - Plan: CBC with Differential, Comprehensive metabolic panel, Lactate dehydrogenase  Non-Hodgkin lymphoma, B-cell, low grade - Plan: CBC with Differential, Comprehensive metabolic panel, Lactate dehydrogenase  CURRENT THERAPY: Maintenance Rituxan.  Deferred today. Last given on 06/06/2015  INTERVAL HISTORY: [name] 60 y.o. 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     Oncology History   InSeptember of 2004 the patient presented with a several month history of left-sided back pain and was seen in the emergency room at which time a paravertebral left-sided mass at T3 was seen. A core biopsy was consistent with follicular non-Hodgkin's lymphoma with some elements of large cell. PET CT scan revealed mediastinal lymph nodes. She was treated with 4 cycles of CHOP-R starting on 03/22/2003 through 05/25/2003 with vincristine dose-reduced for the last 2 cycles due to peripheral neuropathy. She received Rituxan maintenance at 2 month intervals for one year and beginning in March of 2006 Rituxan was given at 3 month intervals with treatment ending in December 2006.  She relapsed with a mass at T7 presenting again with back pain.  This time, she was treated with Cytoxan and Fludara + Rituxan starting in July 2009. Because of myelotoxicity Cytoxan was held for one cycle with repeat study showing no evidence of disease but with persistent thrombocytopenia in the 50-80,000 range. Her paraspinal mass disappeared.. Repeat CT scans on 03/10/2012 showed no evidence of lymphoma.  Sometime in 2009, the patient developed  pulmonary lesions and was operated on by Dr. Arlyce Dice at which time Langerhans histiocytosis was diagnosed. Patient was treated with prednisone 60 mg with a taper down to 10 mg a day which continued until July of 2013.  PET scan was ordered to restage the patient in 2015 with findings of abnormality in the right upper lobe of the lung as well as the left axilla. Left axilla biopsy was consistent with follicular lymphoma.  The patient was evaluated by thoracic surgery for resection of the pulmonary nodule due to its appearance and concern for a primary lung cancer. Wedge resection of the right lower lobe nodule performed on [id number] revealed follicular lymphoma identical to left axillary biopsy performed on 09/28/2013.   Oncology history below provides updated chronology of events from June 2015 to present.      Follicular lymphoma grade I of extranodal and solid organ sites [facility])   12/02/2013 Pathology Results Interpretation Tissue-Flow Cytometry MONOCLONAL B CELL POPULATION IDENTIFIED.   12/02/2013 Initial Diagnosis Diagnosis Lung, wedge biopsy/resection, Right lower lobe - LOW GRADE NON-HODGKIN'S B CELL LYMPHOMA   12/31/2013 - 01/21/2014 Chemotherapy Rituxan weekly x 4 cycles   04/15/2014 -  Chemotherapy Maintenance Rituxan x 2 years   08/20/2014 PET scan No findings to suggest residual or recurrent  lymphoma in the neck, chest, abdomen or pelvis.   06/09/2015 Imaging CT abd/pelvis- No evidence of recurrent lymphoma in the abdomen or pelvis. Mild intrahepatic and extrahepatic biliary ductal dilatation, mildly increased, of uncertain etiology. No appreciable obstructing mass. New patchy tree-in-bud opacities at both lun   08/09/2015 Imaging Chest xray- New diffuse interstitial opacities throughout both lungs with associated patchy airspace opacities in  the right middle lobe and right upper lobe. Recurrent pulmonary lymphoma, atypical infection, recurrent Langerhans histiocytosis and chemo   08/09/2015 Treatment Plan  Change Treatment deferred x 2 weeks due to hypoxia and pulmonary infection (?atypical)   08/09/2015 [facility] Admission Hypoxia and pulmonary infection   I personally reviewed and went over laboratory results with the patient.  The results are noted within this dictation.  Labs are very abnormal today.  [name] noted to have a hyponatremia, hypokalemia, acute renal failure, etc  I personally reviewed and went over radiographic studies with the patient.  The results are noted within this dictation.  CT abd/pelvis in Dec 2016 was negative for any sign of malignancy, but radiologist reported findings suggestive of intra and extrahepatic biliary dilatation of uncertain etiology in addition to a new patchy tree-in-bud opacities at both lung bases.  She was asymptomatic at that time and the reason for her CT scan was due to a lower abdominal pain.  Given her symptoms today, chest xray was performed.  Chest xray demonstrated New diffuse interstitial opacities throughout both lungs with associated patchy airspace opacities in the right middle lobe and right upper lobe. Recurrent pulmonary lymphoma, atypical infection, recurrent Langerhans histiocytosis and chemotherapy induced pulmonary disease with superimposed infection are diagnostic considerations.  [name] due for Rituxan today.  This will be deferred x 2 weeks given the patient's current condition.   [name] reports SOB since Saturday.  She notes a productive cough of green/yellow sputum.  She has not called any healthcare provider regarding her current situation.  "I didn't want to miss my treatment today."  She reports fevers at home up to 104 F.  [name] afebrile this AM, but "I took tylenol this AM."    She continues to smoke 1 ppd.  Smoking cessation education provided.  Past Medical History  Diagnosis Date  . Coronary artery disease     status post stenting of the aortic and iliac vessels by Dr. Gwenlyn Found. Repeat aortic stenting 2010  . Left ventricular  dysfunction     hx of with ejection fraction of 35% range.  . Carotid artery disease (HCC)     hx of carotid cerebrovascular disease with 0-39% bilateral internal carotid artery setenosis   . Non Hodgkin's lymphoma ([place])   . Chronic back pain     status post lumbar surgery  . Hyperlipidemia   . Hypertension   . COPD (chronic obstructive pulmonary disease) ([place])   . Shortness of breath   . Cancer (HCC)     hx of non hogdin lymphoma  . CHF (congestive heart failure) ([place])   . Blood transfusion   . Arthritis   . Myocardial infarction ([place])   . Peripheral vascular disease ([place])   . GERD (gastroesophageal reflux disease)   . Anemia   . Osteoporosis   . Depression   . Anxiety   . Other malignant lymphomas, unspecified site, extranodal and solid organ sites 2004    Back  . Asthma   . Vulvar cancer ([place])   . Anginal pain ([place])     occ last time last wk  . Dysrhythmia     ?  . Stroke ([place]) 04  . Pneumonia     hx  . History of kidney stones   . Seizures ([place])     ? yrs ago  . Lymphocytic lymphoma ([place]) 12/24/2013  . Non-Hodgkin lymphoma, B-cell, low grade 12/24/2013    has HYPERLIPIDEMIA; HYPERTENSION, BENIGN; CAD, NATIVE VESSEL;  LEFT VENTRICULAR FUNCTION, DECREASED; CAROTID ARTERY DISEASE; AORTIC ATHEROSCLEROSIS; ATHEROSCLEROSIS, RENAL ARTERY; Atherosclerosis of native arteries of the extremities with rest pain; PVD; COPD; NON-HODGKIN'S LYMPHOMA, HX OF; Vulvar intraepithelial neoplasia III (VIN III); Vulvar cancer ([place]); Langerhans cell histiocytosis of lung, [id number]; and Follicular lymphoma grade I of extranodal and solid organ sites [facility]) on her problem list.     is allergic to naproxen.  No current facility-administered medications on file prior to visit.   Current Outpatient Prescriptions on File Prior to Visit  Medication Sig Dispense Refill  . ALPRAZolam (XANAX) 1 MG tablet Take 1 mg by mouth 3 (three) times daily as needed for anxiety or sleep (sleep).     [name] amitriptyline  (ELAVIL) 50 MG tablet Take 50 mg by mouth at bedtime.    [name] aspirin 81 MG EC tablet Take 81 mg by mouth daily.      . Biotin 1 MG CAPS Take 1 tablet by mouth daily.     [name] buPROPion (WELLBUTRIN XL) 150 MG 24 hr tablet Take 150 mg by mouth daily.    . clopidogrel (PLAVIX) 75 MG tablet TAKE 1 TABLET DAILY WITH BREAKFAST. 30 tablet 6  . cycloSPORINE (RESTASIS) 0.05 % ophthalmic emulsion Place 1 drop into both eyes 2 (two) times daily.    . diazepam (VALIUM) 5 MG tablet Take 5 mg by mouth every 6 (six) hours as needed for muscle spasms (muscle spasms). For muscle spasms    . furosemide (LASIX) 40 MG tablet Take 40 mg by mouth daily as needed for fluid (fluid).     . isosorbide mononitrate (IMDUR) 30 MG 24 hr tablet TAKE 1/2 TABLET BY MOUTH DAILY. 45 tablet 6  . metoprolol (LOPRESSOR) 50 MG tablet TAKE (1/2) TABLET BY MOUTH TWICE DAILY. 30 tablet 10  . mupirocin ointment (BACTROBAN) 2 % Apply 1 application topically daily as needed (leg infection).     . NEXIUM 40 MG capsule Take 40 mg by mouth daily.     [name] NITROSTAT 0.4 MG SL tablet DISSOLVE 1 TABLET UNDER TONGUE EVERY 5 MINUTES UP TO 15 MINUTES FOR CHEST PAIN. IF NO RELIEF CALL 911. 25 tablet 3  . oxycodone (ROXICODONE) 30 MG immediate release tablet Take 30 mg by mouth every 4 (four) hours as needed for pain (pain). For breakthrough pain    . OXYCONTIN 80 MG T12A 12 hr tablet Take 80 mg by mouth every 6 (six) hours.     . rosuvastatin (CRESTOR) 40 MG tablet Take 40 mg by mouth daily.      . SYMBICORT 160-4.5 MCG/ACT inhaler Inhale 2 puffs into the lungs every 12 (twelve) hours.   11  . vitamin B-12 (CYANOCOBALAMIN) 100 MCG tablet Take 100 mcg by mouth daily.    [name] albuterol (VENTOLIN HFA) 108 (90 BASE) MCG/ACT inhaler Inhale 2 puffs into the lungs every 6 (six) hours as needed for wheezing or shortness of breath (wheezing and shortness of breath). Reported on 08/09/2015    . ciprofloxacin (CIPRO) 500 MG tablet Take 1 tablet (500 mg total) by mouth 2  (two) times daily. (Patient not taking: Reported on 08/09/2015) 14 tablet 0  . sulfamethoxazole-trimethoprim (BACTRIM DS) 800-160 MG per tablet Take 1 tablet by mouth 2 (two) times a week. Reported on 08/09/2015      Past Surgical History  Procedure Laterality Date  . [place] surg    . Back surgery      x 2  . Lung biopsy    . Cardiac catheterization    .  Abdominal aortogram  08/08/2011  . Abdominal hysterectomy      partial  . Portacath placement      pt says its for chemo only and its an old port(04)   . Dilation and curettage of uterus    . Vulva /perineum biopsy  04/09/2012    Procedure: VULVAR BIOPSY;  Surgeon: Florian Buff, MD;  Location: [facility];  Service: Gynecology;  Laterality: N/A;  . Vulvectomy  05/20/2012    Procedure: VULVECTOMY;  Surgeon: Alvino Chapel, MD;  Location: [facility];  Service: Gynecology;  Laterality: Bilateral;  BILATERAL SIMPLE VULVECTOMIES  . Video assisted thoracoscopy (vats)/wedge resection Right 12/02/2013    Procedure: VIDEO ASSISTED THORACOSCOPY (VATS)/WEDGE RESECTION;  Surgeon: Melrose Nakayama, MD;  Location: [place];  Service: Thoracic;  Laterality: Right;  . Abdominal aortagram N/A 08/08/2011    Procedure: ABDOMINAL Maxcine Ham;  Surgeon: Sherren Mocha, MD;  [facility] C[facility] LAB;  Service: Cardiovascular;  Laterality: N/A;  . Percutaneous stent intervention Bilateral 08/08/2011    Procedure: PERCUTANEOUS STENT INTERVENTION;  Surgeon: Sherren Mocha, MD;  Location: [facility] CATH LAB;  Service: Cardiovascular;  Laterality: Bilateral;    Denies any headaches, dizziness, double vision, fevers, chills, night sweats, nausea, vomiting, diarrhea, constipation, chest pain, heart palpitations, shortness of breath, blood in stool, black tarry stool, urinary pain, urinary burning, urinary frequency, hematuria.   PHYSICAL EXAMINATION  ECOG PERFORMANCE STATUS: 1 - Symptomatic but completely ambulatory  Filed Vitals:   08/09/15 0907  BP: 104/33  Pulse: 91  Temp:  97.7 F (36.5 C)  Resp: 18   O2 sat on arrival: 79% O2 with 2 L via [place]: 94%  GENERAL:alert, no distress, comfortable, cooperative, unaccompanied, appearing acutely ill superimposed on appearing chronically ill appearing from effects of severe tobacco abuse. SKIN: positive for: multiple SKs diffusely, thin skin, multiple lower arm ecchymoses, dry skin. HEAD: Normocephalic, No masses, lesions, tenderness or abnormalities EYES: normal, PERRLA, EOMI, Conjunctiva are pink and non-injected EARS: External ears normal OROPHARYNX:lips, buccal mucosa, and tongue normal and mucous membranes are moist  NECK: supple, no adenopathy, thyroid normal size, non-tender, without nodularity, trachea midline LYMPH:  no palpable lymphadenopathy BREAST:not examined LUNGS: positive findings: rhonchi and crackles diffusely, wheezing  Diffusely and decreased breath sounds bilaterally. HEART: regular rate & rhythm, no murmurs and no gallops ABDOMEN:abdomen soft, non-tender and normal bowel sounds BACK: Back symmetric, no curvature., No CVA tenderness EXTREMITIES:less then 2 second capillary refill, no joint deformities, effusion, or inflammation, no edema, no skin discoloration. NEURO: alert & oriented x 3 with fluent speech, no focal motor/sensory deficits, patient in a wheelchair, tired/fatigued   LABORATORY DATA: CBC    Component Value Date/Time   WBC 13.4* 08/09/2015 1003   RBC 4.08 08/09/2015 1003   RBC 4.50 04/15/2014 0852   HGB 12.3 08/09/2015 1003   HCT 35.5* 08/09/2015 1003   PLT 171 08/09/2015 1003   MCV 87.0 08/09/2015 1003   MCH 30.1 08/09/2015 1003   MCHC 34.6 08/09/2015 1003   RDW 15.3 08/09/2015 1003   LYMPHSABS 0.5* 08/09/2015 1003   MONOABS 0.7 08/09/2015 1003   EOSABS 0.0 08/09/2015 1003   BASOSABS 0.0 08/09/2015 1003      Chemistry      Component Value Date/Time   NA 127* 08/09/2015 1003   K 2.1* 08/09/2015 1003   CL 83* 08/09/2015 1003   CO2 27 08/09/2015 1003   BUN 32*  08/09/2015 1003   CREATININE 4.42* 08/09/2015 1003      Component Value Date/Time  CALCIUM 7.9* 08/09/2015 1003   ALKPHOS 176* 08/09/2015 1003   AST 32 08/09/2015 1003   ALT 20 08/09/2015 1003   BILITOT 0.7 08/09/2015 1003        PENDING LABS:   RADIOGRAPHIC STUDIES:  Dg Chest 2 View  08/09/2015  CLINICAL DATA:  60 year old with personal history of treated non-Hodgkin's lymphoma,, including pulmonary parenchymal involvement, presenting with 1 week history of fever and acute onset of shortness of breath earlier today. Hypoxia upon clinical examination earlier today. 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 EXAM: CHEST  2 VIEW COMPARISON:  PET-CT 08/20/2014 and earlier. Chest x-rays 08/12/2014 and earlier. FINDINGS: Left subclavian Port-A-Cath tip remains in the mid SVC. Cardiac silhouette normal in size, unchanged. Thoracic aorta mildly atherosclerotic, unchanged. Since the most recent examinations 1 year ago, development of right hilar enlargement and development of diffuse interstitial pulmonary opacities throughout both lungs. Patchy airspace opacities in the right middle lobe and right upper lobe. Chronic scarring at the site of the prior left lower lobe biopsy. No pleural effusions. No pneumothorax. Visualized bony thorax intact. IMPRESSION: Since the chest x-ray and PET-CT in February, 2016: 1. New diffuse interstitial opacities throughout both lungs with associated patchy airspace opacities in the right middle lobe and right upper lobe. Recurrent pulmonary lymphoma, atypical infection, recurrent Langerhans histiocytosis and chemotherapy induced pulmonary disease with superimposed infection are diagnostic considerations. 2. New right hilar enlargement worrisome for lymphadenopathy. No evidence of lymphadenopathy elsewhere. Electronically Signed   By: Evangeline Dakin M.D.   On: 08/09/2015 10:17     PATHOLOGY:    ASSESSMENT AND PLAN:  Follicular lymphoma grade I of  extranodal and solid organ sites [facility]) S/P 4 weekly infusions of Rituxan finishing on 01/21/2014 followed by maintenance Rituxan q 60 days. PET scan performed on 08/20/2014 demonstrates NED.    Oncology history is updated.  Given the patient's current situation with infectious cough and hypoxia, I will defer reviewing her CT abd/pelvis with her x 2 weeks and work on her current situation.  Upon arrival to the clinic, nursing assessed the patient and diverted the patient to an exam room instead of the to waiting room for her scheduled appointment time.  Her O2 sats were noted to be 79%.  She was started on O2 via [place] at 2L.  Her O2 reponded to rose to 94%.  [name] not in acute distress.  Order is placed for Respiratory to administer Duoneb treatment.  Patient responded nicely to this intervention symptomatically.  Order is placed for DG chest.  Chest xray demonstrates bilateral interstitial opacities.  Given the December 2016 CT abd/pelvis results with tree-in-bud findings without clinical symptoms and chest xray today, atypical infection must be considered.    Patient reports that she has been ill for 1-2 weeks with thick yellow and green sputum production.  [name] afebrile this AM, but notes a documented fever of 104 F at home.  She never contacted anyone regarding this illness.  Numerous electrolyte abnormalities noted today: hyponatremia, hypokalemia in the setting of acute renal failure with a BUN of 32 and creatinine of 4.42 (baseline of ~17 and 1.3 respectively).  Leukocytosis with neutrophilia is noted.  Vitals are stable.  Hypotension is noted at 104/33, but her average documented BP ranges 90-100/40-60.  Her BP is at baseline for her.  Patient's case discussed with Dr. Caryn Section who has agreed to accept the patient with a direct admission to step-down.  Additionally, I have contacted [name], her PCP,  to update him on the patient's case.  She would benefit from his consultation while in the  hospital.  Patient escorted to ICU #1 at 1135 hours.  Dr. Caryn Section paged to let her know that the patient is in her hospital room.  Post-marketing studies of Rituxan reports an incidence of <1% of interstitial pneumonitis.  I think Rituxan being the cause of her current situation is very low, particularly in light of her infectious symptoms (green sputum).  I think she needs IV antibiotics in addition to corticosteroids.  If she does not improve over the next 24-48 hours, then we could possibly consider Rituxan being the causative agent.  [name] not neutropenic.  She has a strong history of recurrent intra-pulmonary infections treated by [name].    Rituxan treatment scheduled today is deferred x 2 weeks.  No treatment today.  Labs today: CBC diff, CMET, LDH  Smoking cessation education provided.  [name] not interested in quitting at this time.  She continues 1 ppd.  Return in 2 weeks for follow-up and possible Rituxan infusion based upon clinical course over the next 2 weeks.   THERAPY PLAN:  Continue with Rituxan every 2 months x 2 years.  All questions were answered. The patient knows to call the clinic with any problems, questions or concerns. We can certainly see the patient much sooner if necessary.  Patient and plan discussed with Dr. Ancil Linsey and she is in agreement with the aforementioned.   This note is electronically signed by: Doy Mince 08/09/2015 11:57 AM

## 2015-08-07 NOTE — Assessment & Plan Note (Addendum)
S/P 4 weekly infusions of Rituxan finishing on 01/21/2014 followed by maintenance Rituxan q 60 days. PET scan performed on 08/20/2014 demonstrates NED.    Oncology history is updated.  Given the patient's current situation with infectious cough and hypoxia, I will defer reviewing her CT abd/pelvis with her x 2 weeks and work on her current situation.  Upon arrival to the clinic, nursing assessed the patient and diverted the patient to an exam room instead of the to waiting room for her scheduled appointment time.  Her O2 sats were noted to be 79%.  She was started on O2 via [place] at 2L.  Her O2 reponded to rose to 94%.  [name] not in acute distress.  Order is placed for Respiratory to administer Duoneb treatment.  Patient responded nicely to this intervention symptomatically.  Order is placed for DG chest.  Chest xray demonstrates bilateral interstitial opacities.  Given the December 2016 CT abd/pelvis results with tree-in-bud findings without clinical symptoms and chest xray today, atypical infection must be considered.    Patient reports that she has been ill for 1-2 weeks with thick yellow and green sputum production.  [name] afebrile this AM, but notes a documented fever of 104 F at home.  She never contacted anyone regarding this illness.  Numerous electrolyte abnormalities noted today: hyponatremia, hypokalemia in the setting of acute renal failure with a BUN of 32 and creatinine of 4.42 (baseline of ~17 and 1.3 respectively).  Leukocytosis with neutrophilia is noted.  Vitals are stable.  Hypotension is noted at 104/33, but her average documented BP ranges 90-100/40-60.  Her BP is at baseline for her.  Patient's case discussed with Dr. Caryn Section who has agreed to accept the patient with a direct admission to step-down.  Additionally, I have contacted [name], her PCP, to update him on the patient's case.  She would benefit from his consultation while in the hospital.  Patient escorted to ICU #1 at  1135 hours.  Dr. Caryn Section paged to let her know that the patient is in her hospital room.  Post-marketing studies of Rituxan reports an incidence of <1% of interstitial pneumonitis.  I think Rituxan being the cause of her current situation is very low, particularly in light of her infectious symptoms (green sputum).  I think she needs IV antibiotics in addition to corticosteroids.  If she does not improve over the next 24-48 hours, then we could possibly consider Rituxan being the causative agent.  [name] not neutropenic.  She has a strong history of recurrent intra-pulmonary infections treated by [name].    Rituxan treatment scheduled today is deferred x 2 weeks.  No treatment today.  Labs today: CBC diff, CMET, LDH  Smoking cessation education provided.  [name] not interested in quitting at this time.  She continues 1 ppd.  Return in 2 weeks for follow-up and possible Rituxan infusion based upon clinical course over the next 2 weeks.

## 2015-08-09 ENCOUNTER — Ambulatory Visit (HOSPITAL_COMMUNITY)
Admission: RE | Admit: 2015-08-09 | Discharge: 2015-08-09 | Disposition: A | Discharge status: Home / Self Care | Payer: Medicare HMO | Source: Ambulatory Visit | Attending: Oncology | Admitting: Oncology

## 2015-08-09 ENCOUNTER — Encounter (HOSPITAL_COMMUNITY): Payer: Self-pay | Admitting: Oncology

## 2015-08-09 ENCOUNTER — Other Ambulatory Visit: Payer: Self-pay

## 2015-08-09 ENCOUNTER — Encounter (HOSPITAL_COMMUNITY): Payer: Medicare HMO | Attending: Oncology | Admitting: Oncology

## 2015-08-09 ENCOUNTER — Encounter (HOSPITAL_COMMUNITY): Payer: Medicare HMO

## 2015-08-09 ENCOUNTER — Inpatient Hospital Stay (HOSPITAL_COMMUNITY)
Admission: AD | Admit: 2015-08-09 | Discharge: 2015-08-17 | DRG: 190 | Disposition: A | Discharge status: Home / Self Care | Payer: Medicare HMO | Source: Ambulatory Visit | Attending: Pulmonary Disease | Admitting: Pulmonary Disease

## 2015-08-09 ENCOUNTER — Encounter (HOSPITAL_COMMUNITY): Payer: Self-pay | Admitting: Internal Medicine

## 2015-08-09 VITALS — BP 104/33 | HR 91 | Temp 97.7°F | Resp 18 | Wt 123.6 lb

## 2015-08-09 DIAGNOSIS — E43 Unspecified severe protein-calorie malnutrition: Secondary | ICD-10-CM | POA: Diagnosis present

## 2015-08-09 DIAGNOSIS — J189 Pneumonia, unspecified organism: Secondary | ICD-10-CM | POA: Diagnosis present

## 2015-08-09 DIAGNOSIS — Z8673 Personal history of transient ischemic attack (TIA), and cerebral infarction without residual deficits: Secondary | ICD-10-CM | POA: Diagnosis not present

## 2015-08-09 DIAGNOSIS — G8929 Other chronic pain: Secondary | ICD-10-CM | POA: Diagnosis present

## 2015-08-09 DIAGNOSIS — D649 Anemia, unspecified: Secondary | ICD-10-CM | POA: Diagnosis present

## 2015-08-09 DIAGNOSIS — C8590 Non-Hodgkin lymphoma, unspecified, unspecified site: Secondary | ICD-10-CM | POA: Diagnosis present

## 2015-08-09 DIAGNOSIS — C8209 Follicular lymphoma grade I, extranodal and solid organ sites: Secondary | ICD-10-CM

## 2015-08-09 DIAGNOSIS — I10 Essential (primary) hypertension: Secondary | ICD-10-CM | POA: Insufficient documentation

## 2015-08-09 DIAGNOSIS — J440 Chronic obstructive pulmonary disease with acute lower respiratory infection: Secondary | ICD-10-CM | POA: Diagnosis present

## 2015-08-09 DIAGNOSIS — Z8051 Family history of malignant neoplasm of kidney: Secondary | ICD-10-CM | POA: Diagnosis not present

## 2015-08-09 DIAGNOSIS — M549 Dorsalgia, unspecified: Secondary | ICD-10-CM | POA: Diagnosis present

## 2015-08-09 DIAGNOSIS — E785 Hyperlipidemia, unspecified: Secondary | ICD-10-CM | POA: Insufficient documentation

## 2015-08-09 DIAGNOSIS — F329 Major depressive disorder, single episode, unspecified: Secondary | ICD-10-CM | POA: Diagnosis present

## 2015-08-09 DIAGNOSIS — Y95 Nosocomial condition: Secondary | ICD-10-CM | POA: Diagnosis present

## 2015-08-09 DIAGNOSIS — Z8544 Personal history of malignant neoplasm of other female genital organs: Secondary | ICD-10-CM

## 2015-08-09 DIAGNOSIS — Z833 Family history of diabetes mellitus: Secondary | ICD-10-CM

## 2015-08-09 DIAGNOSIS — E871 Hypo-osmolality and hyponatremia: Secondary | ICD-10-CM | POA: Diagnosis present

## 2015-08-09 DIAGNOSIS — D72829 Elevated white blood cell count, unspecified: Secondary | ICD-10-CM

## 2015-08-09 DIAGNOSIS — Z825 Family history of asthma and other chronic lower respiratory diseases: Secondary | ICD-10-CM | POA: Diagnosis not present

## 2015-08-09 DIAGNOSIS — Z7951 Long term (current) use of inhaled steroids: Secondary | ICD-10-CM | POA: Diagnosis not present

## 2015-08-09 DIAGNOSIS — J9601 Acute respiratory failure with hypoxia: Secondary | ICD-10-CM | POA: Diagnosis present

## 2015-08-09 DIAGNOSIS — Z87442 Personal history of urinary calculi: Secondary | ICD-10-CM | POA: Diagnosis not present

## 2015-08-09 DIAGNOSIS — N179 Acute kidney failure, unspecified: Secondary | ICD-10-CM | POA: Diagnosis present

## 2015-08-09 DIAGNOSIS — I959 Hypotension, unspecified: Secondary | ICD-10-CM

## 2015-08-09 DIAGNOSIS — I252 Old myocardial infarction: Secondary | ICD-10-CM | POA: Diagnosis not present

## 2015-08-09 DIAGNOSIS — E876 Hypokalemia: Secondary | ICD-10-CM

## 2015-08-09 DIAGNOSIS — Z79891 Long term (current) use of opiate analgesic: Secondary | ICD-10-CM

## 2015-08-09 DIAGNOSIS — R05 Cough: Secondary | ICD-10-CM | POA: Diagnosis not present

## 2015-08-09 DIAGNOSIS — F419 Anxiety disorder, unspecified: Secondary | ICD-10-CM | POA: Diagnosis present

## 2015-08-09 DIAGNOSIS — C8510 Unspecified B-cell lymphoma, unspecified site: Secondary | ICD-10-CM | POA: Diagnosis present

## 2015-08-09 DIAGNOSIS — Z8249 Family history of ischemic heart disease and other diseases of the circulatory system: Secondary | ICD-10-CM | POA: Diagnosis not present

## 2015-08-09 DIAGNOSIS — Z7982 Long term (current) use of aspirin: Secondary | ICD-10-CM

## 2015-08-09 DIAGNOSIS — E861 Hypovolemia: Secondary | ICD-10-CM | POA: Diagnosis present

## 2015-08-09 DIAGNOSIS — K219 Gastro-esophageal reflux disease without esophagitis: Secondary | ICD-10-CM | POA: Diagnosis present

## 2015-08-09 DIAGNOSIS — J45909 Unspecified asthma, uncomplicated: Secondary | ICD-10-CM | POA: Diagnosis present

## 2015-08-09 DIAGNOSIS — F17210 Nicotine dependence, cigarettes, uncomplicated: Secondary | ICD-10-CM | POA: Diagnosis present

## 2015-08-09 DIAGNOSIS — J988 Other specified respiratory disorders: Secondary | ICD-10-CM

## 2015-08-09 DIAGNOSIS — R0902 Hypoxemia: Secondary | ICD-10-CM

## 2015-08-09 DIAGNOSIS — Z955 Presence of coronary angioplasty implant and graft: Secondary | ICD-10-CM | POA: Diagnosis not present

## 2015-08-09 DIAGNOSIS — I129 Hypertensive chronic kidney disease with stage 1 through stage 4 chronic kidney disease, or unspecified chronic kidney disease: Secondary | ICD-10-CM | POA: Diagnosis present

## 2015-08-09 DIAGNOSIS — J449 Chronic obstructive pulmonary disease, unspecified: Secondary | ICD-10-CM | POA: Insufficient documentation

## 2015-08-09 DIAGNOSIS — N170 Acute kidney failure with tubular necrosis: Secondary | ICD-10-CM

## 2015-08-09 DIAGNOSIS — Z1239 Encounter for other screening for malignant neoplasm of breast: Secondary | ICD-10-CM

## 2015-08-09 DIAGNOSIS — Z7902 Long term (current) use of antithrombotics/antiplatelets: Secondary | ICD-10-CM | POA: Diagnosis not present

## 2015-08-09 DIAGNOSIS — M810 Age-related osteoporosis without current pathological fracture: Secondary | ICD-10-CM | POA: Diagnosis present

## 2015-08-09 DIAGNOSIS — T40605A Adverse effect of unspecified narcotics, initial encounter: Secondary | ICD-10-CM | POA: Diagnosis present

## 2015-08-09 DIAGNOSIS — Z6821 Body mass index (BMI) 21.0-21.9, adult: Secondary | ICD-10-CM

## 2015-08-09 DIAGNOSIS — I2510 Atherosclerotic heart disease of native coronary artery without angina pectoris: Secondary | ICD-10-CM | POA: Diagnosis present

## 2015-08-09 DIAGNOSIS — J441 Chronic obstructive pulmonary disease with (acute) exacerbation: Secondary | ICD-10-CM | POA: Diagnosis not present

## 2015-08-09 DIAGNOSIS — Z9221 Personal history of antineoplastic chemotherapy: Secondary | ICD-10-CM | POA: Diagnosis not present

## 2015-08-09 DIAGNOSIS — R0602 Shortness of breath: Secondary | ICD-10-CM | POA: Diagnosis present

## 2015-08-09 DIAGNOSIS — Z801 Family history of malignant neoplasm of trachea, bronchus and lung: Secondary | ICD-10-CM

## 2015-08-09 DIAGNOSIS — I739 Peripheral vascular disease, unspecified: Secondary | ICD-10-CM | POA: Diagnosis present

## 2015-08-09 DIAGNOSIS — N183 Chronic kidney disease, stage 3 (moderate): Secondary | ICD-10-CM | POA: Diagnosis present

## 2015-08-09 DIAGNOSIS — C8300 Small cell B-cell lymphoma, unspecified site: Secondary | ICD-10-CM | POA: Diagnosis present

## 2015-08-09 DIAGNOSIS — C8299 Follicular lymphoma, unspecified, extranodal and solid organ sites: Secondary | ICD-10-CM | POA: Diagnosis not present

## 2015-08-09 HISTORY — DX: Peripheral vascular disease, unspecified: I73.9

## 2015-08-09 HISTORY — DX: Atherosclerosis of native arteries of extremities with rest pain, unspecified extremity: I70.229

## 2015-08-09 HISTORY — DX: Chronic obstructive pulmonary disease, unspecified: J44.9

## 2015-08-09 HISTORY — DX: Adult pulmonary Langerhans cell histiocytosis: J84.82

## 2015-08-09 LAB — COMPREHENSIVE METABOLIC PANEL
ALT: 20 U/L (ref 14–54)
AST: 32 U/L (ref 15–41)
Albumin: 2.7 g/dL — ABNORMAL LOW (ref 3.5–5.0)
Alkaline Phosphatase: 176 U/L — ABNORMAL HIGH (ref 38–126)
Anion gap: 17 — ABNORMAL HIGH (ref 5–15)
BUN: 32 mg/dL — AB (ref 6–20)
CHLORIDE: 83 mmol/L — AB (ref 101–111)
CO2: 27 mmol/L (ref 22–32)
CREATININE: 4.42 mg/dL — AB (ref 0.44–1.00)
Calcium: 7.9 mg/dL — ABNORMAL LOW (ref 8.9–10.3)
GFR calc Af Amer: 12 mL/min — ABNORMAL LOW (ref >60)
GFR calc non Af Amer: 10 mL/min — ABNORMAL LOW (ref >60)
GLUCOSE: 114 mg/dL — AB (ref 65–99)
Potassium: 2.1 mmol/L — CL (ref 3.5–5.1)
SODIUM: 127 mmol/L — AB (ref 135–145)
Total Bilirubin: 0.7 mg/dL (ref 0.3–1.2)
Total Protein: 6.6 g/dL (ref 6.5–8.1)

## 2015-08-09 LAB — CBC WITH DIFFERENTIAL/PLATELET
BASOS ABS: 0 10*3/uL (ref 0.0–0.1)
Basophils Relative: 0 %
EOS ABS: 0 10*3/uL (ref 0.0–0.7)
EOS PCT: 0 %
HCT: 35.5 % — ABNORMAL LOW (ref 36.0–46.0)
Hemoglobin: 12.3 g/dL (ref 12.0–15.0)
LYMPHS PCT: 4 %
Lymphs Abs: 0.5 10*3/uL — ABNORMAL LOW (ref 0.7–4.0)
MCH: 30.1 pg (ref 26.0–34.0)
MCHC: 34.6 g/dL (ref 30.0–36.0)
MCV: 87 fL (ref 78.0–100.0)
Monocytes Absolute: 0.7 10*3/uL (ref 0.1–1.0)
Monocytes Relative: 5 %
Neutro Abs: 12.1 10*3/uL — ABNORMAL HIGH (ref 1.7–7.7)
Neutrophils Relative %: 91 %
PLATELETS: 171 10*3/uL (ref 150–400)
RBC: 4.08 MIL/uL (ref 3.87–5.11)
RDW: 15.3 % (ref 11.5–15.5)
WBC: 13.4 10*3/uL — ABNORMAL HIGH (ref 4.0–10.5)

## 2015-08-09 LAB — GLUCOSE, CAPILLARY
GLUCOSE-CAPILLARY: 158 mg/dL — AB (ref 65–99)
Glucose-Capillary: 172 mg/dL — ABNORMAL HIGH (ref 65–99)

## 2015-08-09 LAB — MAGNESIUM: Magnesium: 1.4 mg/dL — ABNORMAL LOW (ref 1.7–2.4)

## 2015-08-09 LAB — MRSA PCR SCREENING: MRSA by PCR: NEGATIVE

## 2015-08-09 LAB — LACTATE DEHYDROGENASE: LDH: 193 U/L — ABNORMAL HIGH (ref 98–192)

## 2015-08-09 LAB — BRAIN NATRIURETIC PEPTIDE: B Natriuretic Peptide: 294 pg/mL — ABNORMAL HIGH (ref 0.0–100.0)

## 2015-08-09 MED ORDER — ACETAMINOPHEN 650 MG RE SUPP
650.0000 mg | Freq: Four times a day (QID) | RECTAL | Status: DC | PRN
Start: 2015-08-09 — End: 2015-08-17

## 2015-08-09 MED ORDER — ONDANSETRON HCL 4 MG/2ML IJ SOLN: 4.0000 mg | Freq: Four times a day (QID) | INTRAMUSCULAR | Status: DC | PRN

## 2015-08-09 MED ORDER — ASPIRIN EC 81 MG PO TBEC
81.0000 mg | DELAYED_RELEASE_TABLET | Freq: Every day | ORAL | Status: DC
  Administered 2015-08-09 – 2015-08-17 (×9): 81 mg via ORAL
  Filled 2015-08-09 (×9): qty 1

## 2015-08-09 MED ORDER — DEXTROSE 5 % IV SOLN
2.0000 g | Freq: Once | INTRAVENOUS | Status: DC
  Filled 2015-08-09: qty 2

## 2015-08-09 MED ORDER — ENOXAPARIN SODIUM 30 MG/0.3ML ~~LOC~~ SOLN
30.0000 mg | SUBCUTANEOUS | Status: DC
  Administered 2015-08-09 – 2015-08-17 (×9): 30 mg via SUBCUTANEOUS
  Filled 2015-08-09 (×9): qty 0.3

## 2015-08-09 MED ORDER — LEVOFLOXACIN IN D5W 500 MG/100ML IV SOLN
500.0000 mg | INTRAVENOUS | Status: DC
  Administered 2015-08-11 – 2015-08-15 (×3): 500 mg via INTRAVENOUS
  Filled 2015-08-09 (×3): qty 100

## 2015-08-09 MED ORDER — INSULIN ASPART 100 UNIT/ML ~~LOC~~ SOLN
0.0000 [IU] | Freq: Three times a day (TID) | SUBCUTANEOUS | Status: DC
  Administered 2015-08-09 – 2015-08-12 (×5): 3 [IU] via SUBCUTANEOUS
  Administered 2015-08-13: 5 [IU] via SUBCUTANEOUS
  Administered 2015-08-13: 3 [IU] via SUBCUTANEOUS
  Administered 2015-08-14: 2 [IU] via SUBCUTANEOUS
  Administered 2015-08-14: 3 [IU] via SUBCUTANEOUS
  Administered 2015-08-14: 2 [IU] via SUBCUTANEOUS
  Administered 2015-08-15: 5 [IU] via SUBCUTANEOUS
  Administered 2015-08-16: 2 [IU] via SUBCUTANEOUS
  Administered 2015-08-16: 3 [IU] via SUBCUTANEOUS
  Administered 2015-08-16: 8 [IU] via SUBCUTANEOUS
  Administered 2015-08-17: 2 [IU] via SUBCUTANEOUS

## 2015-08-09 MED ORDER — IPRATROPIUM-ALBUTEROL 0.5-2.5 (3) MG/3ML IN SOLN
RESPIRATORY_TRACT | Status: AC
  Filled 2015-08-09: qty 3

## 2015-08-09 MED ORDER — VANCOMYCIN HCL 10 G IV SOLR
1250.0000 mg | Freq: Once | INTRAVENOUS | Status: AC
  Administered 2015-08-09: 1250 mg via INTRAVENOUS
  Filled 2015-08-09: qty 1250

## 2015-08-09 MED ORDER — ALBUTEROL SULFATE (2.5 MG/3ML) 0.083% IN NEBU: 2.5000 mg | INHALATION_SOLUTION | RESPIRATORY_TRACT | Status: DC | PRN

## 2015-08-09 MED ORDER — DEXTROSE 5 % IV SOLN
2.0000 g | Freq: Once | INTRAVENOUS | Status: AC
  Administered 2015-08-09: 2 g via INTRAVENOUS
  Filled 2015-08-09: qty 2

## 2015-08-09 MED ORDER — OXYCODONE HCL 5 MG PO TABS
15.0000 mg | ORAL_TABLET | ORAL | Status: DC | PRN
  Administered 2015-08-11 – 2015-08-17 (×7): 15 mg via ORAL
  Filled 2015-08-09 (×7): qty 3

## 2015-08-09 MED ORDER — NITROGLYCERIN 0.4 MG SL SUBL: 0.4000 mg | SUBLINGUAL_TABLET | SUBLINGUAL | Status: DC | PRN

## 2015-08-09 MED ORDER — ENSURE ENLIVE PO LIQD
237.0000 mL | Freq: Two times a day (BID) | ORAL | Status: DC
  Administered 2015-08-09 – 2015-08-17 (×13): 237 mL via ORAL

## 2015-08-09 MED ORDER — POTASSIUM CHLORIDE CRYS ER 20 MEQ PO TBCR
40.0000 meq | EXTENDED_RELEASE_TABLET | Freq: Two times a day (BID) | ORAL | Status: AC
  Administered 2015-08-09 (×2): 40 meq via ORAL
  Filled 2015-08-09 (×2): qty 2

## 2015-08-09 MED ORDER — CLOPIDOGREL BISULFATE 75 MG PO TABS
75.0000 mg | ORAL_TABLET | Freq: Every day | ORAL | Status: DC
  Administered 2015-08-09 – 2015-08-17 (×9): 75 mg via ORAL
  Filled 2015-08-09 (×9): qty 1

## 2015-08-09 MED ORDER — OXYCODONE HCL ER 20 MG PO T12A
80.0000 mg | EXTENDED_RELEASE_TABLET | Freq: Four times a day (QID) | ORAL | Status: DC
  Administered 2015-08-09 – 2015-08-17 (×30): 80 mg via ORAL
  Filled 2015-08-09 (×2): qty 4
  Filled 2015-08-09: qty 8
  Filled 2015-08-09 (×14): qty 4
  Filled 2015-08-09: qty 8
  Filled 2015-08-09 (×3): qty 4
  Filled 2015-08-09: qty 8
  Filled 2015-08-09 (×7): qty 4
  Filled 2015-08-09: qty 8
  Filled 2015-08-09 (×2): qty 4

## 2015-08-09 MED ORDER — IPRATROPIUM-ALBUTEROL 0.5-2.5 (3) MG/3ML IN SOLN
3.0000 mL | RESPIRATORY_TRACT | Status: DC
  Administered 2015-08-09 – 2015-08-10 (×8): 3 mL via RESPIRATORY_TRACT
  Filled 2015-08-09 (×8): qty 3

## 2015-08-09 MED ORDER — POTASSIUM CHLORIDE IN NACL 20-0.9 MEQ/L-% IV SOLN
INTRAVENOUS | Status: DC
  Administered 2015-08-09: 13:00:00 via INTRAVENOUS
  Administered 2015-08-10: 100 mL/h via INTRAVENOUS
  Administered 2015-08-11 – 2015-08-14 (×6): via INTRAVENOUS

## 2015-08-09 MED ORDER — LEVOFLOXACIN IN D5W 500 MG/100ML IV SOLN: 500.0000 mg | INTRAVENOUS | Status: DC

## 2015-08-09 MED ORDER — PANTOPRAZOLE SODIUM 40 MG PO TBEC
80.0000 mg | DELAYED_RELEASE_TABLET | Freq: Every day | ORAL | Status: DC
  Administered 2015-08-09 – 2015-08-17 (×9): 80 mg via ORAL
  Filled 2015-08-09 (×9): qty 2

## 2015-08-09 MED ORDER — INSULIN ASPART 100 UNIT/ML ~~LOC~~ SOLN
0.0000 [IU] | Freq: Every day | SUBCUTANEOUS | Status: DC
  Administered 2015-08-16: 2 [IU] via SUBCUTANEOUS

## 2015-08-09 MED ORDER — FUROSEMIDE 40 MG PO TABS: 40.0000 mg | ORAL_TABLET | Freq: Every day | ORAL | Status: DC | PRN

## 2015-08-09 MED ORDER — DEXTROSE 5 % IV SOLN
1.0000 g | INTRAVENOUS | Status: DC
  Administered 2015-08-10 – 2015-08-11 (×2): 1 g via INTRAVENOUS
  Filled 2015-08-09 (×5): qty 1

## 2015-08-09 MED ORDER — SENNA 8.6 MG PO TABS
1.0000 | ORAL_TABLET | Freq: Every day | ORAL | Status: DC
  Administered 2015-08-09 – 2015-08-16 (×8): 8.6 mg via ORAL
  Filled 2015-08-09 (×8): qty 1

## 2015-08-09 MED ORDER — MUPIROCIN 2 % EX OINT: 1.0000 "application " | TOPICAL_OINTMENT | Freq: Every day | CUTANEOUS | Status: DC | PRN

## 2015-08-09 MED ORDER — ISOSORBIDE MONONITRATE ER 30 MG PO TB24
15.0000 mg | ORAL_TABLET | Freq: Every day | ORAL | Status: DC
  Administered 2015-08-09 – 2015-08-17 (×9): 15 mg via ORAL
  Filled 2015-08-09 (×9): qty 1

## 2015-08-09 MED ORDER — GUAIFENESIN-DM 100-10 MG/5ML PO SYRP: 5.0000 mL | ORAL_SOLUTION | ORAL | Status: DC | PRN

## 2015-08-09 MED ORDER — ONDANSETRON HCL 4 MG PO TABS: 4.0000 mg | ORAL_TABLET | Freq: Four times a day (QID) | ORAL | Status: DC | PRN

## 2015-08-09 MED ORDER — ACETAMINOPHEN 325 MG PO TABS: 650.0000 mg | ORAL_TABLET | Freq: Four times a day (QID) | ORAL | Status: DC | PRN

## 2015-08-09 MED ORDER — BUPROPION HCL ER (XL) 150 MG PO TB24
150.0000 mg | ORAL_TABLET | Freq: Every day | ORAL | Status: DC
  Administered 2015-08-09 – 2015-08-17 (×9): 150 mg via ORAL
  Filled 2015-08-09 (×11): qty 1

## 2015-08-09 MED ORDER — ALPRAZOLAM 1 MG PO TABS
1.0000 mg | ORAL_TABLET | Freq: Three times a day (TID) | ORAL | Status: DC | PRN
  Administered 2015-08-14 – 2015-08-15 (×2): 1 mg via ORAL
  Filled 2015-08-09 (×2): qty 1

## 2015-08-09 MED ORDER — DEXTROSE 5 % IV SOLN: 1.0000 g | INTRAVENOUS | Status: DC

## 2015-08-09 MED ORDER — CYCLOSPORINE 0.05 % OP EMUL
1.0000 [drp] | Freq: Two times a day (BID) | OPHTHALMIC | Status: DC
  Administered 2015-08-09 – 2015-08-17 (×16): 1 [drp] via OPHTHALMIC
  Filled 2015-08-09 (×21): qty 1

## 2015-08-09 MED ORDER — NICOTINE 21 MG/24HR TD PT24
21.0000 mg | MEDICATED_PATCH | Freq: Every day | TRANSDERMAL | Status: DC
  Administered 2015-08-09 – 2015-08-17 (×9): 21 mg via TRANSDERMAL
  Filled 2015-08-09 (×9): qty 1

## 2015-08-09 MED ORDER — METHYLPREDNISOLONE SODIUM SUCC 125 MG IJ SOLR
60.0000 mg | INTRAMUSCULAR | Status: DC
  Administered 2015-08-09 (×2): 60 mg via INTRAVENOUS
  Filled 2015-08-09 (×2): qty 2

## 2015-08-09 MED ORDER — VANCOMYCIN HCL IN DEXTROSE 750-5 MG/150ML-% IV SOLN
750.0000 mg | INTRAVENOUS | Status: DC
  Administered 2015-08-11: 750 mg via INTRAVENOUS
  Filled 2015-08-09 (×2): qty 150

## 2015-08-09 MED ORDER — VANCOMYCIN HCL 10 G IV SOLR
1250.0000 mg | Freq: Once | INTRAVENOUS | Status: DC
  Filled 2015-08-09: qty 1250

## 2015-08-09 MED ORDER — BENZONATATE 100 MG PO CAPS
100.0000 mg | ORAL_CAPSULE | Freq: Three times a day (TID) | ORAL | Status: DC
  Administered 2015-08-09 – 2015-08-17 (×24): 100 mg via ORAL
  Filled 2015-08-09 (×24): qty 1

## 2015-08-09 MED ORDER — ROSUVASTATIN CALCIUM 20 MG PO TABS
40.0000 mg | ORAL_TABLET | Freq: Every day | ORAL | Status: DC
  Administered 2015-08-09 – 2015-08-17 (×9): 40 mg via ORAL
  Filled 2015-08-09 (×9): qty 2

## 2015-08-09 MED ORDER — BUDESONIDE-FORMOTEROL FUMARATE 160-4.5 MCG/ACT IN AERO
2.0000 | INHALATION_SPRAY | Freq: Two times a day (BID) | RESPIRATORY_TRACT | Status: DC
  Administered 2015-08-09 – 2015-08-17 (×16): 2 via RESPIRATORY_TRACT
  Filled 2015-08-09: qty 6

## 2015-08-09 MED ORDER — VITAMIN B-12 100 MCG PO TABS
100.0000 ug | ORAL_TABLET | Freq: Every day | ORAL | Status: DC
  Administered 2015-08-09 – 2015-08-17 (×9): 100 ug via ORAL
  Filled 2015-08-09 (×11): qty 1

## 2015-08-09 MED ORDER — AMITRIPTYLINE HCL 25 MG PO TABS
50.0000 mg | ORAL_TABLET | Freq: Every day | ORAL | Status: DC
  Administered 2015-08-09 – 2015-08-16 (×8): 50 mg via ORAL
  Filled 2015-08-09 (×9): qty 2

## 2015-08-09 MED ORDER — IPRATROPIUM-ALBUTEROL 0.5-2.5 (3) MG/3ML IN SOLN
3.0000 mL | Freq: Four times a day (QID) | RESPIRATORY_TRACT | Status: DC
  Administered 2015-08-09: 3 mL via RESPIRATORY_TRACT

## 2015-08-09 MED ORDER — VANCOMYCIN HCL IN DEXTROSE 750-5 MG/150ML-% IV SOLN: 750.0000 mg | INTRAVENOUS | Status: DC

## 2015-08-09 MED ORDER — LEVOFLOXACIN IN D5W 750 MG/150ML IV SOLN
750.0000 mg | Freq: Once | INTRAVENOUS | Status: AC
  Administered 2015-08-09: 750 mg via INTRAVENOUS
  Filled 2015-08-09: qty 150

## 2015-08-09 NOTE — Progress Notes (Signed)
..  [name] arrived today, grayish in color, reporting difficulty breathing and fever over the last few days. Straight to exam room, O2 sat 78%, oxygen placed on patient at 2 l/min and T. Kefalas PA-C notified of condition. Breathing treatment ordered, chest xray. Patient's O2 sat up to 91% after oxygen placed and treatment delivered. Venipuncture rt AC for labs, tolerated well  Transported to Xray with O2 in progress. Returned to [facility] on completion of chest x ray. Patient seen by PA and decision made to admit. Transported to ICU bed 01, vitals stable O2 in progress @ 2 l/min and report to [place]

## 2015-08-09 NOTE — Progress Notes (Signed)
CRITICAL VALUE ALERT Critical value received:  Potassium 2.1 Date of notification:  08/09/15   Time of notification: [id number] Critical value read back:  Yes.   Nurse who received alert:  T.Shalan Neault,RN MD notified (1st page):  [phone]

## 2015-08-09 NOTE — Progress Notes (Signed)
ANTIBIOTIC CONSULT NOTE-Preliminary  Pharmacy Consult for Vancomycin, Levaquin, Cefepime D/W Dr Caryn Section re: Levaquin and triple abx therapy initially  Indication: pneumonia  Allergies  Allergen Reactions  . Morphine And Related Other (See Comments)    Unknown reaction   . Naproxen     Made me feel "loopy" in the head   Patient Measurements: Height: 5\' 4"  (162.6 cm) Weight: 127 lb 6.8 oz (57.8 kg) IBW/kg (Calculated) : 54.7  Vital Signs: Temp: 97.7 F (36.5 C) (02/07 0907) Temp Source: Oral (02/07 0907) BP: 98/49 mmHg (02/07 1300) Pulse Rate: 63 (02/07 1300)  Labs:  Recent Labs  08/09/15 1003  WBC 13.4*  HGB 12.3  PLT 171  CREATININE 4.42*   Estimated Creatinine Clearance: 12.1 mL/min (by C-G formula based on Cr of 4.42).  No results for input(s): VANCOTROUGH, VANCOPEAK, VANCORANDOM, GENTTROUGH, GENTPEAK, GENTRANDOM, TOBRATROUGH, TOBRAPEAK, TOBRARND, AMIKACINPEAK, AMIKACINTROU, AMIKACIN in the last 72 hours.   Microbiology: No results found for this or any previous visit (from the past 720 hour(s)).  Medical History: Past Medical History  Diagnosis Date  . Coronary artery disease     status post stenting of the aortic and iliac vessels by Dr. Gwenlyn Found. Repeat aortic stenting 2010  . Left ventricular dysfunction     hx of with ejection fraction of 35% range.  . Carotid artery disease (HCC)     hx of carotid cerebrovascular disease with 0-39% bilateral internal carotid artery setenosis   . Non Hodgkin's lymphoma ([place])   . Chronic back pain     status post lumbar surgery  . Hyperlipidemia   . Hypertension   . COPD (chronic obstructive pulmonary disease) ([place])   . Shortness of breath   . Cancer (HCC)     hx of non hogdin lymphoma  . CHF (congestive heart failure) ([place])   . Blood transfusion   . Arthritis   . Myocardial infarction ([place])   . Peripheral vascular disease ([place])   . GERD (gastroesophageal reflux disease)   . Anemia   . Osteoporosis   .  Depression   . Anxiety   . Other malignant lymphomas, unspecified site, extranodal and solid organ sites 2004    Back  . Asthma   . Vulvar cancer ([place])   . Anginal pain ([place])     occ last time last wk  . Dysrhythmia     ?  . Stroke ([place]) 04  . Pneumonia     hx  . History of kidney stones   . Seizures ([place])     ? yrs ago  . Lymphocytic lymphoma ([place]) 12/24/2013  . Non-Hodgkin lymphoma, B-cell, low grade 12/24/2013  . Atherosclerosis of native arteries of the extremities with rest pain 12/10/2008    Qualifier: Diagnosis of  By: Owens Shark, RN, BSN, Lauren    . Langerhans cell histiocytosis of lung, 2009 11/10/2013  . PVD 10/27/2008    Qualifier: Diagnosis of  By: Owens Shark, RN, BSN, Lauren    . COPD 10/27/2008    Qualifier: Diagnosis of  By: Owens Shark, RN, BSN, Lauren     Anti-infectives    Start     Dose/Rate Route Frequency Ordered Stop   08/11/15 1600  levofloxacin (LEVAQUIN) IVPB 500 mg     500 mg 100 mL/hr over 60 Minutes Intravenous Every 48 hours 08/09/15 1441     08/11/15 1200  vancomycin (VANCOCIN) IVPB 750 mg/150 ml premix     750 mg 150 mL/hr over 60 Minutes Intravenous Every 48 hours 08/09/15 1444  08/10/15 1500  ceFEPIme (MAXIPIME) 1 g in dextrose 5 % 50 mL IVPB     1 g 100 mL/hr over 30 Minutes Intravenous Every 24 hours 08/09/15 1440     08/09/15 1500  vancomycin (VANCOCIN) 1,250 mg in sodium chloride 0.9 % 250 mL IVPB     1,250 mg 166.7 mL/hr over 90 Minutes Intravenous  Once 08/09/15 1231     08/09/15 1400  ceFEPIme (MAXIPIME) 2 g in dextrose 5 % 50 mL IVPB     2 g 100 mL/hr over 30 Minutes Intravenous  Once 08/09/15 1229     08/09/15 1300  levofloxacin (LEVAQUIN) IVPB 750 mg     750 mg 100 mL/hr over 90 Minutes Intravenous  Once 08/09/15 1228       Assessment: 60yo female with h/o CKD.  SCr is worse this admission.  Starting abx for HCAP.   1. Acute kidney injury, superimposed on stage II to stage III chronic kidney disease. Per chart review, her baseline creatinine  is 1.3-1.45. It looks like she has some chronic kidney disease. On admission, her creatinine is 4.42, significantly higher than baseline.   Goal of Therapy:  Eradicate infection.  Plan:  Vancomycin 1250mg  IV today (loading dose) then 750mg  IV q48hrs Cefepime 2gm IV today x 1 then 1gm IV q24hrs Levaquin 750mg  IV today x 1 then 500mg  IV q48hrs Monitor labs, renal fxn, progress and c/s  Ena Dawley, RPH 08/09/2015,2:45 PM

## 2015-08-09 NOTE — H&P (Signed)
[facility] History and Physical  [name] [id number] DOB: April 14, 1956 DOA: 08/09/2015  Referring physician: Oncology PA, Robynn Pane PCP: [name], MD   Chief Complaint: Shortness of breath and hypoxia at the [place].  HPI: [name] is a 60 y.o. female with a history of recurrent lymphocytic lymphoma-on chemotherapy, emphysema/COPD, Langerhans cell histiocytosis of lung, 2009, PVD, CAD, and hypertension, who presents as a direct admission from [facility] for shortness of breath and hypoxia. The patient presented to oncology today for chemotherapy. She was found to be dyspneic and with an oxygen saturation in the 70s on room air. She was placed on 3 L of nasal cannula oxygen and her oxygen saturation increased to 93%. She reports a 2 day history of worsening shortness of breath and productive cough with yellow sputum. She has had subjective fever and chills, but no pleurisy, nausea, vomiting, diarrhea, abdominal pain, or leg swelling. She continues to smoke 1 ppd. She uses her inhalers, but she is not on home oxygen. She was also told that she had a low blood potassium level in the past, but apparently has not been taking potassium supplements.  During the evaluation by oncology, she was noted to be hypoxic with an oxygen saturation of 78%, otherwise afebrile and hemodynamically stable. Her lab data are significant for WBC of 13.4, sodium of 127, potassium of 2.1, BUN of 32, creatinine of 4.42, and glucose of 114. Her chest x-ray reveals new diffuse interstitial opacities throughout both lungs with associated patchy airspace opacities in the right middle lobe and right upper lobe; new right hilar enlargement worrisome for lymphadenopathy. [name] being admitted for further evaluation and management.    Review of Systems:  As above in history present illness. In addition, she has had an unintentional weight loss of about 50 pounds in  the past year. Otherwise review systems is negative.  Past Medical History  Diagnosis Date  . Coronary artery disease     status post stenting of the aortic and iliac vessels by Dr. Gwenlyn Found. Repeat aortic stenting 2010  . Left ventricular dysfunction     hx of with ejection fraction of 35% range.  . Carotid artery disease (HCC)     hx of carotid cerebrovascular disease with 0-39% bilateral internal carotid artery setenosis   . Non Hodgkin's lymphoma ([place])   . Chronic back pain     status post lumbar surgery  . Hyperlipidemia   . Hypertension   . COPD (chronic obstructive pulmonary disease) ([place])   . Shortness of breath   . Cancer (HCC)     hx of non hogdin lymphoma  . CHF (congestive heart failure) ([place])   . Blood transfusion   . Arthritis   . Myocardial infarction ([place])   . Peripheral vascular disease ([place])   . GERD (gastroesophageal reflux disease)   . Anemia   . Osteoporosis   . Depression   . Anxiety   . Other malignant lymphomas, unspecified site, extranodal and solid organ sites 2004    Back  . Asthma   . Vulvar cancer ([place])   . Anginal pain ([place])     occ last time last wk  . Dysrhythmia     ?  . Stroke ([place]) 04  . Pneumonia     hx  . History of kidney stones   . Seizures ([place])     ? yrs ago  . Lymphocytic lymphoma ([place]) 12/24/2013  . Non-Hodgkin lymphoma, B-cell, low grade 12/24/2013  .  Atherosclerosis of native arteries of the extremities with rest pain 12/10/2008    Qualifier: Diagnosis of  By: Owens Shark, RN, BSN, Lauren    . Langerhans cell histiocytosis of lung, 2009 11/10/2013  . PVD 10/27/2008    Qualifier: Diagnosis of  By: Owens Shark, RN, BSN, Lauren    . COPD 10/27/2008    Qualifier: Diagnosis of  By: Owens Shark RN, BSN, Lauren     Past Surgical History  Procedure Laterality Date  . [place] surg    . Back surgery      x 2  . Lung biopsy    . Cardiac catheterization    . Abdominal aortogram  08/08/2011  . Abdominal hysterectomy      partial  . Portacath placement       pt says its for chemo only and its an old port(04)   . Dilation and curettage of uterus    . Vulva /perineum biopsy  04/09/2012    Procedure: VULVAR BIOPSY;  Surgeon: Florian Buff, MD;  Location: [facility];  Service: Gynecology;  Laterality: N/A;  . Vulvectomy  05/20/2012    Procedure: VULVECTOMY;  Surgeon: Alvino Chapel, MD;  Location: [facility];  Service: Gynecology;  Laterality: Bilateral;  BILATERAL SIMPLE VULVECTOMIES  . Video assisted thoracoscopy (vats)/wedge resection Right 12/02/2013    Procedure: VIDEO ASSISTED THORACOSCOPY (VATS)/WEDGE RESECTION;  Surgeon: Melrose Nakayama, MD;  Location: [facility];  Service: Thoracic;  Laterality: Right;  . Abdominal aortagram N/A 08/08/2011    Procedure: ABDOMINAL Maxcine Ham;  Surgeon: Sherren Mocha, MD;  Location: [facility] C[facility] LAB;  Service: Cardiovascular;  Laterality: N/A;  . Percutaneous stent intervention Bilateral 08/08/2011    Procedure: PERCUTANEOUS STENT INTERVENTION;  Surgeon: Sherren Mocha, MD;  Location: [facility] CATH LAB;  Service: Cardiovascular;  Laterality: Bilateral;   Social History: [name] divorced. She has 2 children. She smokes one pack of cigarettes per day.  She has a 100 pack-year smoking history. She has never used smokeless tobacco. She reports that she does not drink alcohol or use illicit drugs.  Allergies  Allergen Reactions  . Morphine And Related Other (See Comments)    Unknown reaction   . Naproxen     Made me feel "loopy" in the head    Family History  Problem Relation Age of Onset  . Coronary artery disease      positive for vascular and cardiac disease  . Heart disease Mother   . Lung cancer Mother   . Heart attack Mother   . Heart failure Mother   . Heart disease Father   . Kidney cancer Father   . Heart attack Father   . Heart failure Father   . Diabetes Sister   . Aortic aneurysm Sister   . Heart attack Brother   . Asthma Son   . Sleep apnea Son   . Endometriosis Daughter     Prior to Admission  medications   Medication Sig Start Date End Date Taking? Authorizing Provider  albuterol (VENTOLIN HFA) 108 (90 BASE) MCG/ACT inhaler Inhale 2 puffs into the lungs every 6 (six) hours as needed for wheezing or shortness of breath (wheezing and shortness of breath). Reported on 08/09/2015    Historical Provider, MD  ALPRAZolam Duanne Moron) 1 MG tablet Take 1 mg by mouth 3 (three) times daily as needed for anxiety or sleep (sleep).     Historical Provider, MD  amitriptyline (ELAVIL) 50 MG tablet Take 50 mg by mouth at bedtime.    Historical Provider, MD  aspirin 81 MG EC tablet Take 81 mg by mouth daily.      Historical Provider, MD  Biotin 1 MG CAPS Take 1 tablet by mouth daily.     Historical Provider, MD  buPROPion (WELLBUTRIN XL) 150 MG 24 hr tablet Take 150 mg by mouth daily.    Historical Provider, MD  ciprofloxacin (CIPRO) 500 MG tablet Take 1 tablet (500 mg total) by mouth 2 (two) times daily. Patient not taking: Reported on 08/09/2015 06/06/15   Patrici Ranks, MD  clopidogrel (PLAVIX) 75 MG tablet TAKE 1 TABLET DAILY WITH BREAKFAST. 12/21/14   Sherren Mocha, MD  cycloSPORINE (RESTASIS) 0.05 % ophthalmic emulsion Place 1 drop into both eyes 2 (two) times daily.    Historical Provider, MD  diazepam (VALIUM) 5 MG tablet Take 5 mg by mouth every 6 (six) hours as needed for muscle spasms (muscle spasms). For muscle spasms    Historical Provider, MD  furosemide (LASIX) 40 MG tablet Take 40 mg by mouth daily as needed for fluid (fluid).  05/19/13   Historical Provider, MD  isosorbide mononitrate (IMDUR) 30 MG 24 hr tablet TAKE 1/2 TABLET BY MOUTH DAILY. 12/21/14   Sherren Mocha, MD  metoprolol (LOPRESSOR) 50 MG tablet TAKE (1/2) TABLET BY MOUTH TWICE DAILY. 07/06/15   Sherren Mocha, MD  mupirocin ointment (BACTROBAN) 2 % Apply 1 application topically daily as needed (leg infection).  03/13/12   Historical Provider, MD  NEXIUM 40 MG capsule Take 40 mg by mouth daily.  12/29/11   Historical Provider, MD    NITROSTAT 0.4 MG SL tablet DISSOLVE 1 TABLET UNDER TONGUE EVERY 5 MINUTES UP TO 15 MINUTES FOR CHEST PAIN. IF NO RELIEF CALL 911. 08/27/14   Sherren Mocha, MD  oxycodone (ROXICODONE) 30 MG immediate release tablet Take 30 mg by mouth every 4 (four) hours as needed for pain (pain). For breakthrough pain    Historical Provider, MD  OXYCONTIN 80 MG T12A 12 hr tablet Take 80 mg by mouth every 6 (six) hours.  01/13/15   Historical Provider, MD  rosuvastatin (CRESTOR) 40 MG tablet Take 40 mg by mouth daily.      Historical Provider, MD  sulfamethoxazole-trimethoprim (BACTRIM DS) 800-160 MG per tablet Take 1 tablet by mouth 2 (two) times a week. Reported on 08/09/2015    Historical Provider, MD  SYMBICORT 160-4.5 MCG/ACT inhaler Inhale 2 puffs into the lungs every 12 (twelve) hours.  06/01/14   Historical Provider, MD  vitamin B-12 (CYANOCOBALAMIN) 100 MCG tablet Take 100 mcg by mouth daily.    Historical Provider, MD   Physical Exam: Filed Vitals:   08/09/15 1143  Height: 5\' 4"  (1.626 m)  Weight: 57.8 kg (127 lb 6.8 oz)    Wt Readings from Last 3 Encounters:  08/09/15 57.8 kg (127 lb 6.8 oz)  08/09/15 56.065 kg (123 lb 9.6 oz)  07/15/15 58.695 kg (129 lb 6.4 oz)    General:  Alert 60 year old Caucasian woman who looks older than her stated age. Eyes: PERRL, normal lids, irises & conjunctiva; conjunctivae are clear and sclerae are white. ENT: grossly normal hearing; oropharynx reveals dry mucous membranes and multiple missing teeth. Neck: no LAD, masses or thyromegaly Cardiovascular: RRR, no m/r/g. No LE edema. Telemetry: SR, no arrhythmias  Respiratory: Scattered wheezes throughout all lung fields and occasional crackles; breathing nonlabored at rest; Abdomen: Positive bowel sounds, soft, nontender, nondistended. Skin: no rash or induration seen on limited exam Musculoskeletal: grossly normal tone BUE/BLE; no acute hot ridge. Psychiatric:  grossly normal mood and affect, speech fluent and  appropriate Neurologic: grossly non-focal; cranial nerves II through XII are intact.           Labs on Admission:  Basic Metabolic Panel:  Recent Labs Lab 08/09/15 1003  NA 127*  K 2.1*  CL 83*  CO2 27  GLUCOSE 114*  BUN 32*  CREATININE 4.42*  CALCIUM 7.9*   Liver Function Tests:  Recent Labs Lab 08/09/15 1003  AST 32  ALT 20  ALKPHOS 176*  BILITOT 0.7  PROT 6.6  ALBUMIN 2.7*   No results for input(s): LIPASE, AMYLASE in the last 168 hours. No results for input(s): AMMONIA in the last 168 hours. CBC:  Recent Labs Lab 08/09/15 1003  WBC 13.4*  NEUTROABS 12.1*  HGB 12.3  HCT 35.5*  MCV 87.0  PLT 171   Cardiac Enzymes: No results for input(s): CKTOTAL, CKMB, CKMBINDEX, TROPONINI in the last 168 hours.  BNP (last 3 results)  Recent Labs  08/12/14 1224  BNP 59.9    ProBNP (last 3 results) No results for input(s): PROBNP in the last 8760 hours.  CBG: No results for input(s): GLUCAP in the last 168 hours.  Radiological Exams on Admission: Dg Chest 2 View  08/09/2015  CLINICAL DATA:  60 year old with personal history of treated non-Hodgkin's lymphoma,, including pulmonary parenchymal involvement, presenting with 1 week history of fever and acute onset of shortness of breath earlier today. Hypoxia upon clinical examination earlier today. 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 EXAM: CHEST  2 VIEW COMPARISON:  PET-CT 08/20/2014 and earlier. Chest x-rays 08/12/2014 and earlier. FINDINGS: Left subclavian Port-A-Cath tip remains in the mid SVC. Cardiac silhouette normal in size, unchanged. Thoracic aorta mildly atherosclerotic, unchanged. Since the most recent examinations 1 year ago, development of right hilar enlargement and development of diffuse interstitial pulmonary opacities throughout both lungs. Patchy airspace opacities in the right middle lobe and right upper lobe. Chronic scarring at the site of the prior left lower lobe biopsy. No  pleural effusions. No pneumothorax. Visualized bony thorax intact. IMPRESSION: Since the chest x-ray and PET-CT in February, 2016: 1. New diffuse interstitial opacities throughout both lungs with associated patchy airspace opacities in the right middle lobe and right upper lobe. Recurrent pulmonary lymphoma, atypical infection, recurrent Langerhans histiocytosis and chemotherapy induced pulmonary disease with superimposed infection are diagnostic considerations. 2. New right hilar enlargement worrisome for lymphadenopathy. No evidence of lymphadenopathy elsewhere. Electronically Signed   By: Evangeline Dakin M.D.   On: 08/09/2015 10:17    EKG: Independently reviewed. Pending  Assessment/Plan Principal Problem:   Acute respiratory failure with hypoxia (HCC) Active Problems:   HCAP (healthcare-associated pneumonia)   COPD with exacerbation ([place])   Acute kidney injury ([place])   Hypokalemia   Hyponatremia   HYPERTENSION, BENIGN   CAD, NATIVE VESSEL   Lymphocytic lymphoma ([place])   1. Acute respiratory failure with hypoxia, secondary to HCAP and COPD exacerbation. The patient's chest x-ray is wholly abnormal. The differential diagnoses include atypical pneumonia, recurrent Langerhans histiocytosis, or chemotherapy induced pulmonary disease with superimposed infection. The patient will be treated for HCAP and COPD exacerbation with IV Solu-Medrol, DuoNeb's, oxygen, Levaquin, cefepime, and vancomycin. Oxygen will be ordered and applied to keep her oxygen saturations greater than 90%. Her PCP is pulmonologist [name], who will assume care tomorrow. The patient currently appears comfortable on nasal cannula oxygen. 2. Hypokalemia. The patient's serum potassium is 2.1. She has not taken Lasix as needed in several months.  Etiology of her hyponatremia is unknown. Will check a magnesium level. Will start potassium chloride supplementation orally 1 day in the setting of acute kidney injury. We'll add gentle  potassium to IV fluids. We'll monitor her serum potassium closely. 3. Acute kidney injury, superimposed on stage II to stage III chronic kidney disease. Per chart review, her baseline creatinine is 1.3-1.45. It looks like she has some chronic kidney disease per chart review. On admission, her creatinine is 4.42, significantly higher than baseline. For treatment, we'll start gentle IV fluids, as she appears to be volume depleted clinically. Will order strict ins and outs. If her renal function does not improve with IV fluids, would order a renal ultrasound and other urine indices. 4. Hyponatremia. The patient's serum sodium is 127. Her serum sodium is usually within normal limits. Hyponatremia is likely from hypovolemia. She denies vomiting and diarrhea however. Will start normal saline infusion. We'll continue to monitor. 5. Hypertension. Patient is treated chronically with metoprolol. Her blood pressure is low-normal, so we'll hold metoprolol. 6. Lymphocytic lymphoma. The patient is followed by oncologist, Dr. Whitney Muse. Patient receives chemotherapy per Dr. Whitney Muse. Per her PA, Mr. Sheldon Silvan, the pulmonary findings are less likely from systemic chemotherapy. 7. Tobacco abuse. The patient was strongly advised to stop smoking. Tobacco cessation counseling will be ordered. A nicotine patch will be placed. patch will be placed.      Code Status: Partial code. The patient once everything except intubation/mechanical ventilation. DVT Prophylaxis: Lovenox Family Communication: Family not available Disposition Plan: Discharge when clinically appropriate, likely in a few days.  Time spent: One hour and 10 minutes.  [facility] Pager [phone]

## 2015-08-09 NOTE — Patient Instructions (Signed)
..  [facility] Discharge Instructions  RECOMMENDATIONS MADE BY THE CONSULTANT AND ANY TEST RESULTS WILL BE SENT TO YOUR REFERRING PHYSICIAN.  Admit to ICU  Thank you for choosing [facility] to provide your oncology and hematology care.  To afford each patient quality time with our provider, please arrive at least 15 minutes before your scheduled appointment time.   Beginning January 23rd 2017 lab work for the [facility] will be done in the  Main lab at [facility] on 1st floor. If you have a lab appointment with the [facility] please come in thru the  Main Entrance and check in at the main information desk  You need to re-schedule your appointment should you arrive 10 or more minutes late.  We strive to give you quality time with our providers, and arriving late affects you and other patients whose appointments are after yours.  Also, if you no show three or more times for appointments you may be dismissed from the clinic at the providers discretion.     Again, thank you for choosing [facility].  Our hope is that these requests will decrease the amount of time that you wait before being seen by our physicians.       _____________________________________________________________  Should you have questions after your visit to [facility], please contact our office at [phone] between the hours of 8:30 a.m. and 4:30 p.m.  Voicemails left after 4:30 p.m. will not be returned until the following business day.  For prescription refill requests, have your pharmacy contact our office.

## 2015-08-10 LAB — BASIC METABOLIC PANEL
Anion gap: 12 (ref 5–15)
BUN: 39 mg/dL — AB (ref 6–20)
CO2: 25 mmol/L (ref 22–32)
CREATININE: 4.16 mg/dL — AB (ref 0.44–1.00)
Calcium: 8 mg/dL — ABNORMAL LOW (ref 8.9–10.3)
Chloride: 99 mmol/L — ABNORMAL LOW (ref 101–111)
GFR calc Af Amer: 13 mL/min — ABNORMAL LOW (ref >60)
GFR calc non Af Amer: 11 mL/min — ABNORMAL LOW (ref >60)
GLUCOSE: 163 mg/dL — AB (ref 65–99)
POTASSIUM: 3.4 mmol/L — AB (ref 3.5–5.1)
SODIUM: 136 mmol/L (ref 135–145)

## 2015-08-10 LAB — CBC
HCT: 33.4 % — ABNORMAL LOW (ref 36.0–46.0)
Hemoglobin: 11.5 g/dL — ABNORMAL LOW (ref 12.0–15.0)
MCH: 30.2 pg (ref 26.0–34.0)
MCHC: 34.4 g/dL (ref 30.0–36.0)
MCV: 87.7 fL (ref 78.0–100.0)
PLATELETS: 158 10*3/uL (ref 150–400)
RBC: 3.81 MIL/uL — AB (ref 3.87–5.11)
RDW: 15.4 % (ref 11.5–15.5)
WBC: 12.4 10*3/uL — AB (ref 4.0–10.5)

## 2015-08-10 LAB — GLUCOSE, CAPILLARY
GLUCOSE-CAPILLARY: 94 mg/dL (ref 65–99)
GLUCOSE-CAPILLARY: 200 mg/dL — AB (ref 65–99)
Glucose-Capillary: 117 mg/dL — ABNORMAL HIGH (ref 65–99)

## 2015-08-10 MED ORDER — IPRATROPIUM-ALBUTEROL 0.5-2.5 (3) MG/3ML IN SOLN
3.0000 mL | Freq: Four times a day (QID) | RESPIRATORY_TRACT | Status: DC
  Administered 2015-08-11 (×3): 3 mL via RESPIRATORY_TRACT
  Filled 2015-08-10 (×4): qty 3

## 2015-08-10 MED ORDER — GUAIFENESIN ER 600 MG PO TB12
1200.0000 mg | ORAL_TABLET | Freq: Two times a day (BID) | ORAL | Status: DC
  Administered 2015-08-10 – 2015-08-17 (×15): 1200 mg via ORAL
  Filled 2015-08-10 (×14): qty 2

## 2015-08-10 MED ORDER — METHYLPREDNISOLONE SODIUM SUCC 125 MG IJ SOLR
60.0000 mg | Freq: Two times a day (BID) | INTRAMUSCULAR | Status: DC
  Administered 2015-08-10 – 2015-08-13 (×7): 60 mg via INTRAVENOUS
  Filled 2015-08-10 (×7): qty 2

## 2015-08-10 NOTE — Progress Notes (Addendum)
Initial Nutrition Assessment  DOCUMENTATION CODES:   Severe malnutrition in context of chronic illness  INTERVENTION:  Ensure Enlive po BID, each supplement provides 350 kcal and 20 grams of protein    NUTRITION DIAGNOSIS:   Increased nutrient needs related to chronic illness as evidenced by estimated needs, per patient/family report.  GOAL:   Patient will meet greater than or equal to 90% of their needs  MONITOR:   PO intake, Supplement acceptance, Weight trends, Labs  REASON FOR ASSESSMENT:   Malnutrition Screening Tool    ASSESSMENT:   [name] is a 60 y.o. female with a history of recurrent lymphocytic lymphoma-on chemotherapy, emphysema/COPD, Langerhans cell histiocytosis of lung, 2009, PVD, CAD, and hypertension, who presents as a direct admission from [place] for shortness of breath and hypoxia. The patient presented to oncology today for chemotherapy. She was found to be dyspneic and with an oxygen saturation in the 70s on room air. She was placed on 3 L of nasal cannula oxygen and her oxygen saturation increased to 93%. She reports a 2 day history of worsening shortness of breath and productive cough with yellow sputum.   Pt lives alone and is able to shop and prepare her own food. She typically eats only one meal daily which is usually  a frozen dinner such as chicken pot pie and drinks a Sundrop or water. She denies snacking between meals and doesn't drink coffee. [name] also a smoker (1ppd) . Based on her diet hx and weight loss records she is not consuming sufficient nutrients to meet est needs especially given her recurrent cancer and chemotherapy. Her diet is deficient in fruits, vegetables, whole grains, adequate protein or healthy fats. Encouraged her to begin to consider adding more healthy options to her daily intake when she returns home.  She did eat well last night at dinner and had a good breakfast this morning (75%) per chart but at  lunch today only ate her chicken and potatoes. [name] also drinking an Ensure Enlive (50%).   Nutrition focused physical exam findings:mild to moderate muscle loss and subcutaneous fat depletion. No edema.   Pt meets criteria for severe MALNUTRITION in the context of chronic illness  as evidenced by weight loss 9% in < 90 days and energy intake </= 75% for >/= 1 month.   Abnormal labs: BUN-39, Cr 4.16, glucose-163 CBG's 172-94 today  Diet Order:  Diet Heart Room service appropriate?: Yes; Fluid consistency:: Thin  Skin:   intact   Last BM:   unknown  Height:   Ht Readings from Last 1 Encounters:  08/09/15 5\' 4"  (1.626 m)    Weight:   Wt Readings from Last 1 Encounters:  08/10/15 127 lb 6.8 oz (57.8 kg)    Ideal Body Weight:  54.5 kg  BMI:  Body mass index is 21.86 kg/(m^2).  Estimated Nutritional Needs:   Kcal:  [id number]   Protein:  70-81 gr  Fluid:  1.7-1.9 liters daily  EDUCATION NEEDS:   Education needs addressed  Colman Cater MS,RD,CSG,LDN Office: [phone] Pager: [phone]

## 2015-08-10 NOTE — Care Management Note (Signed)
Case Management Note  Patient Details  Name: [name] MRN: [id number] Date of Birth: [birth year]  Subjective/Objective:                  Pt admitted with PNU. Pt is from home, lives alone and is ind with ADL's. Pt has strong family support. Pt has no HH or home O2 prior to admission. Pt feels she would benefit from hospital bed as she is very deconditioned at this time. Pt plans to return home, may need HH. Pt will need home O2 assessment prior to DC.   Action/Plan: Hospital bed will be ordered and Blake Divine, from [facility], per pt's choice, has been made aware of referral and will obtain pt info from chart.  Will cont to follow for DC planning.   Expected Discharge Date:     08/12/2015             Expected Discharge Plan:  Home/Self Care  In-House Referral:  NA  Discharge planning Services  CM Consult  Post Acute Care Choice:  Durable Medical Equipment Choice offered to:  Patient  DME Arranged:  Hospital bed, Oxygen DME Agency:  [place]:    [facility] Agency:     Status of Service:  In process, will continue to follow  Medicare Important Message Given:    Date Medicare IM Given:    Medicare IM give by:    Date Additional Medicare IM Given:    Additional Medicare Important Message give by:     If discussed at [place] of Stay Meetings, dates discussed:    Additional Comments:  Sherald Barge, RN 08/10/2015, 2:31 PM

## 2015-08-10 NOTE — Consult Note (Signed)
[name] MRN: [id number] DOB/AGE: 02-12-1956 60 y.o. Primary Care Physician:HAWKINS,EDWARD L, MD Admit date: 08/09/2015 Chief Complaint: No chief complaint on file.  HPI: Pt is  60 year old female with past medical hx of lymphocytic lymphoma with c/o dyspnea.  HPI dates back to past 2 week ago when pt started having cough, it was productive with greenish phlegm. Pt was also found to be hypotensive and   febrile with fever of  104 and admitted for further work up. Pt seen today in ICU.Pt today feels better NO c/o chest pain NO c/o nausea/vomiting NO c/o abdominal pain NO c/o  syncope   Past Medical History  Diagnosis Date  . Coronary artery disease     status post stenting of the aortic and iliac vessels by Dr. Gwenlyn Found. Repeat aortic stenting 2010  . Left ventricular dysfunction     hx of with ejection fraction of 35% range.  . Carotid artery disease (HCC)     hx of carotid cerebrovascular disease with 0-39% bilateral internal carotid artery setenosis   . Non Hodgkin's lymphoma ([place])   . Chronic back pain     status post lumbar surgery  . Hyperlipidemia   . Hypertension   . COPD (chronic obstructive pulmonary disease) ([place])   . Shortness of breath   . Cancer (HCC)     hx of non hogdin lymphoma  . CHF (congestive heart failure) ([place])   . Blood transfusion   . Arthritis   . Myocardial infarction ([place])   . Peripheral vascular disease ([place])   . GERD (gastroesophageal reflux disease)   . Anemia   . Osteoporosis   . Depression   . Anxiety   . Other malignant lymphomas, unspecified site, extranodal and solid organ sites 2004    Back  . Asthma   . Vulvar cancer ([place])   . Anginal pain ([place])     occ last time last wk  . Dysrhythmia     ?  . Stroke ([place]) 04  . Pneumonia     hx  . History of kidney stones   . Seizures ([place])     ? yrs ago  . Lymphocytic lymphoma ([place]) 12/24/2013  . Non-Hodgkin lymphoma, B-cell, low grade 12/24/2013  . Atherosclerosis of native arteries  of the extremities with rest pain 12/10/2008    Qualifier: Diagnosis of  By: Owens Shark, RN, BSN, Lauren    . Langerhans cell histiocytosis of lung, 2009 11/10/2013  . PVD 10/27/2008    Qualifier: Diagnosis of  By: Owens Shark, RN, BSN, Lauren    . COPD 10/27/2008    Qualifier: Diagnosis of  By: Owens Shark, RN, BSN, Lauren          Family History  Problem Relation Age of Onset  . Coronary artery disease      positive for vascular and cardiac disease  . Heart disease Mother   . Lung cancer Mother   . Heart attack Mother   . Heart failure Mother   . Heart disease Father   . Kidney cancer Father   . Heart attack Father   . Heart failure Father   . Diabetes Sister   . Aortic aneurysm Sister   . Heart attack Brother   . Asthma Son   . Sleep apnea Son   . Endometriosis Daughter     Social History:  reports that she has been smoking Cigarettes.  She has a 100 pack-year smoking history. She has never used smokeless tobacco. She reports that she  does not drink alcohol or use illicit drugs.   Allergies:  Allergies  Allergen Reactions  . Morphine And Related Other (See Comments)    Unknown reaction   . Naproxen     Made me feel "loopy" in the head    Medications Prior to Admission  Medication Sig Dispense Refill  . albuterol (VENTOLIN HFA) 108 (90 BASE) MCG/ACT inhaler Inhale 2 puffs into the lungs every 6 (six) hours as needed for wheezing or shortness of breath (wheezing and shortness of breath). Reported on 08/09/2015    . ALPRAZolam (XANAX) 1 MG tablet Take 1 mg by mouth 3 (three) times daily as needed for anxiety or sleep (sleep).     [name] amitriptyline (ELAVIL) 50 MG tablet Take 50 mg by mouth at bedtime.    [name] aspirin 81 MG EC tablet Take 81 mg by mouth daily.      [name] BREO ELLIPTA 100-25 MCG/INH AEPB Take 1 Inhaler by mouth 2 (two) times daily.    [name] buPROPion (WELLBUTRIN XL) 150 MG 24 hr tablet Take 150 mg by mouth daily.    . clopidogrel (PLAVIX) 75 MG tablet TAKE 1 TABLET DAILY WITH  BREAKFAST. 30 tablet 6  . cycloSPORINE (RESTASIS) 0.05 % ophthalmic emulsion Place 1 drop into both eyes 2 (two) times daily.    . diazepam (VALIUM) 5 MG tablet Take 5 mg by mouth every 6 (six) hours as needed for muscle spasms (muscle spasms). For muscle spasms    . furosemide (LASIX) 40 MG tablet Take 40 mg by mouth daily as needed for fluid (fluid).     . isosorbide mononitrate (IMDUR) 30 MG 24 hr tablet TAKE 1/2 TABLET BY MOUTH DAILY. 45 tablet 6  . metoprolol (LOPRESSOR) 50 MG tablet TAKE (1/2) TABLET BY MOUTH TWICE DAILY. 30 tablet 10  . NEXIUM 40 MG capsule Take 40 mg by mouth daily.     [name] oxycodone (ROXICODONE) 30 MG immediate release tablet Take 30 mg by mouth every 4 (four) hours as needed for pain (pain). For breakthrough pain    . OXYCONTIN 80 MG T12A 12 hr tablet Take 80 mg by mouth every 6 (six) hours.     . rosuvastatin (CRESTOR) 40 MG tablet Take 40 mg by mouth daily.      [name] sulfaDIAZINE 500 MG tablet Take 1 tablet by mouth 2 (two) times daily. Only on weekends    . SYMBICORT 160-4.5 MCG/ACT inhaler Inhale 2 puffs into the lungs every 12 (twelve) hours.   11  . vitamin B-12 (CYANOCOBALAMIN) 100 MCG tablet Take 100 mcg by mouth daily.    . Biotin 1 MG CAPS Take 1 tablet by mouth daily. Reported on 08/09/2015    . ciprofloxacin (CIPRO) 500 MG tablet Take 1 tablet (500 mg total) by mouth 2 (two) times daily. (Patient not taking: Reported on 08/09/2015) 14 tablet 0  . mupirocin ointment (BACTROBAN) 2 % Apply 1 application topically daily as needed (leg infection). Reported on 08/09/2015    . NITROSTAT 0.4 MG SL tablet DISSOLVE 1 TABLET UNDER TONGUE EVERY 5 MINUTES UP TO 15 MINUTES FOR CHEST PAIN. IF NO RELIEF CALL 911. 25 tablet 3  . sulfamethoxazole-trimethoprim (BACTRIM DS) 800-160 MG per tablet Take 1 tablet by mouth 2 (two) times a week. Reported on 08/09/2015         [id number] from the symptoms mentioned above,there are no other symptoms referable to all systems reviewed.  [name]  amitriptyline  50 mg Oral QHS  . aspirin  EC  81 mg Oral Daily  . benzonatate  100 mg Oral TID  . budesonide-formoterol  2 puff Inhalation Q12H  . buPROPion  150 mg Oral Daily  . ceFEPime (MAXIPIME) IV  1 g Intravenous Q24H  . clopidogrel  75 mg Oral Daily  . cycloSPORINE  1 drop Both Eyes BID  . enoxaparin (LOVENOX) injection  30 mg Subcutaneous Q24H  . feeding supplement (ENSURE ENLIVE)  237 mL Oral BID BM  . guaiFENesin  1,200 mg Oral BID  . insulin aspart  0-15 Units Subcutaneous TID WC  . insulin aspart  0-5 Units Subcutaneous QHS  . ipratropium-albuterol  3 mL Nebulization Q4H  . isosorbide mononitrate  15 mg Oral Daily  . [START ON 08/11/2015] levofloxacin (LEVAQUIN) IV  500 mg Intravenous Q48H  . methylPREDNISolone (SOLU-MEDROL) injection  60 mg Intravenous Q12H  . nicotine  21 mg Transdermal Daily  . oxyCODONE  80 mg Oral Q6H  . pantoprazole  80 mg Oral Q1200  . rosuvastatin  40 mg Oral Daily  . senna  1 tablet Oral QHS  . [START ON 08/11/2015] vancomycin  750 mg Intravenous Q48H  . vitamin B-12  100 mcg Oral Daily     Physical Exam: Vital signs in last 24 hours: Temp:  [97 F (36.1 C)-97.5 F (36.4 C)] 97.2 F (36.2 C) (02/08 0400) Pulse Rate:  [63-86] 78 (02/08 0600) Resp:  [13-24] 18 (02/08 0600) BP: (92-114)/(46-61) 114/50 mmHg (02/08 0600) SpO2:  [79 %-97 %] 94 % (02/08 0859) FiO2 (%):  [36 %] 36 % (02/07 2200) Weight:  [127 lb 6.8 oz (57.8 kg)] 127 lb 6.8 oz (57.8 kg) (02/08 0500) Weight change:  Last BM Date:  (couple of days ago, pt unsure of exact day)  Intake/Output from previous day: 02/07 0701 - 02/08 0700 In: 2080 [P.O.:240; I.V.:1690; IV Piggyback:150] Out: 1450 [Urine:1450]     Physical Exam: General- pt is awake,alert, oriented to time place and person Resp- No acute REsp distress,  Rhonchi+ CVS- S1S2 regular in rate and rhythm GIT- BS+, soft, NT, ND EXT- NO LE Edema, Cyanosis CNS- CN 2-12 grossly intact. Moving all 4 extremities Psych-  normal mood and affect    Lab Results: CBC  Recent Labs  08/09/15 1003 08/10/15 0550  WBC 13.4* 12.4*  HGB 12.3 11.5*  HCT 35.5* 33.4*  PLT 171 158    BMET  Recent Labs  08/09/15 1003 08/10/15 0550  NA 127* 136  K 2.1* 3.4*  CL 83* 99*  CO2 27 25  GLUCOSE 114* 163*  BUN 32* 39*  CREATININE 4.42* 4.16*  CALCIUM 7.9* 8.0*   Creat  Trend 2014  4.42=>4.16 2016  1.3--1.45 2015  1.2--1.5 2013  1.2   Sodium 127=>136  Potassium 2.1=>3.4  MICRO Recent Results (from the past 240 hour(s))  MRSA PCR Screening     Status: None   Collection Time: 08/09/15 11:45 AM  Result Value Ref Range Status   MRSA by PCR NEGATIVE NEGATIVE Final    Comment:        The GeneXpert MRSA Assay (FDA approved for NASAL specimens only), is one component of a comprehensive MRSA colonization surveillance program. It is not intended to diagnose MRSA infection nor to guide or monitor treatment for MRSA infections.       Lab Results  Component Value Date   CALCIUM 8.0* 08/10/2015      Impression: 1)Renal  AKI secondary to Prerenal/ATN  AKI secondary to sepsis/Hypotension/Bactrim                AKi minimally better                AKI on CKD               CKD stage .               CKD since               CKD secondary to                Progression of CKD                 Proteinura will check. Present/Absent .               M i/a croalbuminuria Present/Absent.                Hematuria .                Nephrolithiasis Hx Present/Absent   2)CVs- hemodynamically stable   3)Anemia HGb stable  4)Oncology- hx of recurrent lymphocytic lymphoma      Being followed by oncology  5)Id- admitted with HCAP On IV Abx Primary MD following  6)Electrolytes  Hypokalemic     Better  Hyponatremic     better  7)Acid base Co2 at goal     Plan:  Will ask for renal u/s Will ask for Fena Will continue current tx      Journey Ratterman S 08/10/2015,  10:10 AM

## 2015-08-10 NOTE — Progress Notes (Signed)
Subjective: She was admitted yesterday with bilateral pneumonia and respiratory failure. She says she feels better. At baseline she has multiple medical problems including lymphoma history of vulvar cancer severe COPD coronary artery occlusive disease peripheral arterial disease chronic pain and chronic renal failure  Objective: Vital signs in last 24 hours: Temp:  [97 F (36.1 C)-97.7 F (36.5 C)] 97.2 F (36.2 C) (02/08 0400) Pulse Rate:  [63-91] 78 (02/08 0600) Resp:  [13-24] 18 (02/08 0600) BP: (92-114)/(33-61) 114/50 mmHg (02/08 0600) SpO2:  [78 %-97 %] 92 % (02/08 0600) FiO2 (%):  [36 %] 36 % (02/07 2200) Weight:  [56.065 kg (123 lb 9.6 oz)-57.8 kg (127 lb 6.8 oz)] 57.8 kg (127 lb 6.8 oz) (02/08 0500) Weight change:  Last BM Date:  (couple of days ago, pt unsure of exact day)  Intake/Output from previous day: 02/07 0701 - 02/08 0700 In: 2080 [P.O.:240; I.V.:1690; IV Piggyback:150] Out: 1450 [Urine:1450]  PHYSICAL EXAM General appearance: alert, cooperative and mild distress Resp: rales bilaterally and rhonchi bilaterally Cardio: regular rate and rhythm, S1, S2 normal, no murmur, click, rub or gallop GI: soft, non-tender; bowel sounds normal; no masses,  no organomegaly Extremities: extremities normal, atraumatic, no cyanosis or edema  Lab Results:  Results for orders placed or performed during the hospital encounter of 08/09/15 (from the past 48 hour(s))  Magnesium     Status: Abnormal   Collection Time: 08/09/15 10:03 AM  Result Value Ref Range   Magnesium 1.4 (L) 1.7 - 2.4 mg/dL  MRSA PCR Screening     Status: None   Collection Time: 08/09/15 11:45 AM  Result Value Ref Range   MRSA by PCR NEGATIVE NEGATIVE    Comment:        The GeneXpert MRSA Assay (FDA approved for NASAL specimens only), is one component of a comprehensive MRSA colonization surveillance program. It is not intended to diagnose MRSA infection nor to guide or monitor treatment for MRSA  infections.   Brain natriuretic peptide     Status: Abnormal   Collection Time: 08/09/15 12:45 PM  Result Value Ref Range   B Natriuretic Peptide 294.0 (H) 0.0 - 100.0 pg/mL  Glucose, capillary     Status: Abnormal   Collection Time: 08/09/15  4:42 PM  Result Value Ref Range   Glucose-Capillary 172 (H) 65 - 99 mg/dL   Comment 1 Notify RN    Comment 2 Document in Chart   Glucose, capillary     Status: Abnormal   Collection Time: 08/09/15  9:36 PM  Result Value Ref Range   Glucose-Capillary 158 (H) 65 - 99 mg/dL  CBC     Status: Abnormal   Collection Time: 08/10/15  5:50 AM  Result Value Ref Range   WBC 12.4 (H) 4.0 - 10.5 K/uL   RBC 3.81 (L) 3.87 - 5.11 MIL/uL   Hemoglobin 11.5 (L) 12.0 - 15.0 g/dL   HCT 33.4 (L) 36.0 - 46.0 %   MCV 87.7 78.0 - 100.0 fL   MCH 30.2 26.0 - 34.0 pg   MCHC 34.4 30.0 - 36.0 g/dL   RDW 15.4 11.5 - 15.5 %   Platelets 158 150 - 400 K/uL  Basic metabolic panel     Status: Abnormal   Collection Time: 08/10/15  5:50 AM  Result Value Ref Range   Sodium 136 135 - 145 mmol/L    Comment: DELTA CHECK NOTED   Potassium 3.4 (L) 3.5 - 5.1 mmol/L   Chloride 99 (L) 101 - 111  mmol/L   CO2 25 22 - 32 mmol/L   Glucose, Bld 163 (H) 65 - 99 mg/dL   BUN 39 (H) 6 - 20 mg/dL   Creatinine, Ser 4.16 (H) 0.44 - 1.00 mg/dL   Calcium 8.0 (L) 8.9 - 10.3 mg/dL   GFR calc non Af Amer 11 (L) >60 mL/min   GFR calc Af Amer 13 (L) >60 mL/min    Comment: (NOTE) The eGFR has been calculated using the CKD EPI equation. This calculation has not been validated in all clinical situations. eGFR's persistently <60 mL/min signify possible Chronic Kidney Disease.    Anion gap 12 5 - 15    ABGS No results for input(s): PHART, PO2ART, TCO2, HCO3 in the last 72 hours.  Invalid input(s): PCO2 CULTURES Recent Results (from the past 240 hour(s))  MRSA PCR Screening     Status: None   Collection Time: 08/09/15 11:45 AM  Result Value Ref Range Status   MRSA by PCR NEGATIVE NEGATIVE  Final    Comment:        The GeneXpert MRSA Assay (FDA approved for NASAL specimens only), is one component of a comprehensive MRSA colonization surveillance program. It is not intended to diagnose MRSA infection nor to guide or monitor treatment for MRSA infections.    Studies/Results: Dg Chest 2 View  08/09/2015  CLINICAL DATA:  60 year old with personal history of treated non-Hodgkin's lymphoma,, including pulmonary parenchymal involvement, presenting with 1 week history of fever and acute onset of shortness of breath earlier today. Hypoxia upon clinical examination earlier today. 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 EXAM: CHEST  2 VIEW COMPARISON:  PET-CT 08/20/2014 and earlier. Chest x-rays 08/12/2014 and earlier. FINDINGS: Left subclavian Port-A-Cath tip remains in the mid SVC. Cardiac silhouette normal in size, unchanged. Thoracic aorta mildly atherosclerotic, unchanged. Since the most recent examinations 1 year ago, development of right hilar enlargement and development of diffuse interstitial pulmonary opacities throughout both lungs. Patchy airspace opacities in the right middle lobe and right upper lobe. Chronic scarring at the site of the prior left lower lobe biopsy. No pleural effusions. No pneumothorax. Visualized bony thorax intact. IMPRESSION: Since the chest x-ray and PET-CT in February, 2016: 1. New diffuse interstitial opacities throughout both lungs with associated patchy airspace opacities in the right middle lobe and right upper lobe. Recurrent pulmonary lymphoma, atypical infection, recurrent Langerhans histiocytosis and chemotherapy induced pulmonary disease with superimposed infection are diagnostic considerations. 2. New right hilar enlargement worrisome for lymphadenopathy. No evidence of lymphadenopathy elsewhere. Electronically Signed   By: Evangeline Dakin M.D.   On: 08/09/2015 10:17    Medications:  Prior to Admission:  Prescriptions prior to  admission  Medication Sig Dispense Refill Last Dose  . albuterol (VENTOLIN HFA) 108 (90 BASE) MCG/ACT inhaler Inhale 2 puffs into the lungs every 6 (six) hours as needed for wheezing or shortness of breath (wheezing and shortness of breath). Reported on 08/09/2015   Past Month at Unknown time  . ALPRAZolam (XANAX) 1 MG tablet Take 1 mg by mouth 3 (three) times daily as needed for anxiety or sleep (sleep).    08/08/2015 at Unknown time  . amitriptyline (ELAVIL) 50 MG tablet Take 50 mg by mouth at bedtime.   08/08/2015 at Unknown time  . aspirin 81 MG EC tablet Take 81 mg by mouth daily.     08/08/2015 at Unknown time  . BREO ELLIPTA 100-25 MCG/INH AEPB Take 1 Inhaler by mouth 2 (two) times daily.  08/09/2015 at Unknown time  . buPROPion (WELLBUTRIN XL) 150 MG 24 hr tablet Take 150 mg by mouth daily.   08/08/2015 at Unknown time  . clopidogrel (PLAVIX) 75 MG tablet TAKE 1 TABLET DAILY WITH BREAKFAST. 30 tablet 6 08/08/2015 at Unknown time  . cycloSPORINE (RESTASIS) 0.05 % ophthalmic emulsion Place 1 drop into both eyes 2 (two) times daily.   08/08/2015 at Unknown time  . diazepam (VALIUM) 5 MG tablet Take 5 mg by mouth every 6 (six) hours as needed for muscle spasms (muscle spasms). For muscle spasms   08/08/2015 at Unknown time  . furosemide (LASIX) 40 MG tablet Take 40 mg by mouth daily as needed for fluid (fluid).    08/08/2015 at Unknown time  . isosorbide mononitrate (IMDUR) 30 MG 24 hr tablet TAKE 1/2 TABLET BY MOUTH DAILY. 45 tablet 6 08/08/2015 at Unknown time  . metoprolol (LOPRESSOR) 50 MG tablet TAKE (1/2) TABLET BY MOUTH TWICE DAILY. 30 tablet 10 08/08/2015 at Unknown time  . NEXIUM 40 MG capsule Take 40 mg by mouth daily.    08/08/2015 at Unknown time  . oxycodone (ROXICODONE) 30 MG immediate release tablet Take 30 mg by mouth every 4 (four) hours as needed for pain (pain). For breakthrough pain   08/08/2015 at Unknown time  . OXYCONTIN 80 MG T12A 12 hr tablet Take 80 mg by mouth every 6 (six) hours.    08/08/2015 at  Unknown time  . rosuvastatin (CRESTOR) 40 MG tablet Take 40 mg by mouth daily.     08/08/2015 at Unknown time  . sulfaDIAZINE 500 MG tablet Take 1 tablet by mouth 2 (two) times daily. Only on weekends   Past Month at Unknown time  . SYMBICORT 160-4.5 MCG/ACT inhaler Inhale 2 puffs into the lungs every 12 (twelve) hours.   11 08/09/2015 at Unknown time  . vitamin B-12 (CYANOCOBALAMIN) 100 MCG tablet Take 100 mcg by mouth daily.   08/08/2015 at Unknown time  . Biotin 1 MG CAPS Take 1 tablet by mouth daily. Reported on 08/09/2015   Not Taking at Unknown time  . ciprofloxacin (CIPRO) 500 MG tablet Take 1 tablet (500 mg total) by mouth 2 (two) times daily. (Patient not taking: Reported on 08/09/2015) 14 tablet 0 Completed Course at Unknown time  . mupirocin ointment (BACTROBAN) 2 % Apply 1 application topically daily as needed (leg infection). Reported on 08/09/2015   Not Taking at Unknown time  . NITROSTAT 0.4 MG SL tablet DISSOLVE 1 TABLET UNDER TONGUE EVERY 5 MINUTES UP TO 15 MINUTES FOR CHEST PAIN. IF NO RELIEF CALL 911. 25 tablet 3 Taking  . sulfamethoxazole-trimethoprim (BACTRIM DS) 800-160 MG per tablet Take 1 tablet by mouth 2 (two) times a week. Reported on 08/09/2015   Completed Course at Unknown time   Scheduled: . amitriptyline  50 mg Oral QHS  . aspirin EC  81 mg Oral Daily  . benzonatate  100 mg Oral TID  . budesonide-formoterol  2 puff Inhalation Q12H  . buPROPion  150 mg Oral Daily  . ceFEPime (MAXIPIME) IV  1 g Intravenous Q24H  . clopidogrel  75 mg Oral Daily  . cycloSPORINE  1 drop Both Eyes BID  . enoxaparin (LOVENOX) injection  30 mg Subcutaneous Q24H  . feeding supplement (ENSURE ENLIVE)  237 mL Oral BID BM  . guaiFENesin  1,200 mg Oral BID  . insulin aspart  0-15 Units Subcutaneous TID WC  . insulin aspart  0-5 Units Subcutaneous QHS  . ipratropium-albuterol  3 mL Nebulization Q4H  . isosorbide mononitrate  15 mg Oral Daily  . [START ON 08/11/2015] levofloxacin (LEVAQUIN) IV  500 mg  Intravenous Q48H  . methylPREDNISolone (SOLU-MEDROL) injection  60 mg Intravenous Q12H  . nicotine  21 mg Transdermal Daily  . oxyCODONE  80 mg Oral Q6H  . pantoprazole  80 mg Oral Q1200  . rosuvastatin  40 mg Oral Daily  . senna  1 tablet Oral QHS  . [START ON 08/11/2015] vancomycin  750 mg Intravenous Q48H  . vitamin B-12  100 mcg Oral Daily   Continuous: . 0.9 % NaCl with KCl 20 mEq / L 100 mL/hr at 08/10/15 0600   PJK:[id number] **OR** acetaminophen, albuterol, ALPRAZolam, guaiFENesin-dextromethorphan, mupirocin ointment, nitroGLYCERIN, ondansetron **OR** ondansetron (ZOFRAN) IV, oxycodone  Assesment: She has acute hypoxic respiratory failure. She has healthcare associated pneumonia which is bilateral. At baseline she has severe COPD and she has exacerbation. [name] improving. Her oxygenation is better. She says she is breathing better  Her hypokalemia has improved and she is still on replacement  She has lymphocytic lymphoma on maintenance therapy  She has acute on chronic renal failure  She has coronary artery occlusive disease and peripheral arterial disease.  She has continued nicotine abuse  She has trouble with anxiety and depression at baseline  She has chronic back pain and has been on pain medications for at least 10 years Principal Problem:   Acute respiratory failure with hypoxia ([place]) Active Problems:   HYPERTENSION, BENIGN   CAD, NATIVE VESSEL   HCAP (healthcare-associated pneumonia)   COPD with exacerbation ([place])   Acute kidney injury ([place])   Hypokalemia   Hyponatremia   Lymphocytic lymphoma ([place])    Plan: Although she has multiple medical problems I think she is okay to move out of the ICU now    LOS: 1 day   Pualani Borah L 08/10/2015, 8:08 AM

## 2015-08-11 ENCOUNTER — Inpatient Hospital Stay (HOSPITAL_COMMUNITY): Payer: Medicare HMO

## 2015-08-11 DIAGNOSIS — E43 Unspecified severe protein-calorie malnutrition: Secondary | ICD-10-CM | POA: Insufficient documentation

## 2015-08-11 LAB — CBC WITH DIFFERENTIAL/PLATELET
BASOS ABS: 0 10*3/uL (ref 0.0–0.1)
BASOS PCT: 0 %
EOS ABS: 0 10*3/uL (ref 0.0–0.7)
Eosinophils Relative: 0 %
HCT: 27.1 % — ABNORMAL LOW (ref 36.0–46.0)
Hemoglobin: 9 g/dL — ABNORMAL LOW (ref 12.0–15.0)
LYMPHS ABS: 0.2 10*3/uL — AB (ref 0.7–4.0)
LYMPHS PCT: 3 %
MCH: 29.9 pg (ref 26.0–34.0)
MCHC: 33.2 g/dL (ref 30.0–36.0)
MCV: 90 fL (ref 78.0–100.0)
MONOS PCT: 2 %
Monocytes Absolute: 0.2 10*3/uL (ref 0.1–1.0)
NEUTROS ABS: 6.3 10*3/uL (ref 1.7–7.7)
NEUTROS PCT: 95 %
PLATELETS: 137 10*3/uL — AB (ref 150–400)
RBC: 3.01 MIL/uL — ABNORMAL LOW (ref 3.87–5.11)
RDW: 16 % — AB (ref 11.5–15.5)
WBC: 6.6 10*3/uL (ref 4.0–10.5)

## 2015-08-11 LAB — VITAMIN B12: Vitamin B-12: 4048 pg/mL — ABNORMAL HIGH (ref 180–914)

## 2015-08-11 LAB — IRON AND TIBC
IRON: 53 ug/dL (ref 28–170)
SATURATION RATIOS: 24 % (ref 10.4–31.8)
TIBC: 218 ug/dL — AB (ref 250–450)
UIBC: 165 ug/dL

## 2015-08-11 LAB — SODIUM, URINE, RANDOM: Sodium, Ur: 60 mmol/L

## 2015-08-11 LAB — GLUCOSE, CAPILLARY
GLUCOSE-CAPILLARY: 118 mg/dL — AB (ref 65–99)
Glucose-Capillary: 102 mg/dL — ABNORMAL HIGH (ref 65–99)
Glucose-Capillary: 192 mg/dL — ABNORMAL HIGH (ref 65–99)

## 2015-08-11 LAB — BASIC METABOLIC PANEL
ANION GAP: 11 (ref 5–15)
BUN: 43 mg/dL — ABNORMAL HIGH (ref 6–20)
CHLORIDE: 106 mmol/L (ref 101–111)
CO2: 24 mmol/L (ref 22–32)
CREATININE: 3.61 mg/dL — AB (ref 0.44–1.00)
Calcium: 7.9 mg/dL — ABNORMAL LOW (ref 8.9–10.3)
GFR calc non Af Amer: 13 mL/min — ABNORMAL LOW (ref >60)
GFR, EST AFRICAN AMERICAN: 15 mL/min — AB (ref >60)
Glucose, Bld: 129 mg/dL — ABNORMAL HIGH (ref 65–99)
POTASSIUM: 3.8 mmol/L (ref 3.5–5.1)
SODIUM: 141 mmol/L (ref 135–145)

## 2015-08-11 LAB — RETICULOCYTES
RBC.: 3.62 MIL/uL — ABNORMAL LOW (ref 3.87–5.11)
RETIC COUNT ABSOLUTE: 36.2 10*3/uL (ref 19.0–186.0)
Retic Ct Pct: 1 % (ref 0.4–3.1)

## 2015-08-11 LAB — FOLATE: FOLATE: 9.3 ng/mL (ref >5.9)

## 2015-08-11 LAB — CREATININE, URINE, RANDOM: Creatinine, Urine: 21.88 mg/dL

## 2015-08-11 LAB — FERRITIN: Ferritin: 365 ng/mL — ABNORMAL HIGH (ref 11–307)

## 2015-08-11 MED ORDER — IPRATROPIUM-ALBUTEROL 0.5-2.5 (3) MG/3ML IN SOLN
3.0000 mL | Freq: Three times a day (TID) | RESPIRATORY_TRACT | Status: DC
  Administered 2015-08-11 – 2015-08-17 (×18): 3 mL via RESPIRATORY_TRACT
  Filled 2015-08-11 (×17): qty 3

## 2015-08-11 NOTE — Progress Notes (Signed)
Subjective: Patient is feeling much better. She has occasional cough but no sputum production. Presently she denies any nausea or vomiting.   Objective: Vital signs in last 24 hours: Temp:  [96.7 F (35.9 C)-98.3 F (36.8 C)] 98.3 F (36.8 C) (02/09 0555) Pulse Rate:  [76-91] 76 (02/09 0555) Resp:  [14-23] 16 (02/09 0555) BP: (90-124)/(51-82) 119/59 mmHg (02/09 0555) SpO2:  [90 %-97 %] 96 % (02/09 0905)  Intake/Output from previous day: 02/08 0701 - 02/09 0700 In: 3140 [P.O.:790; I.V.:2300; IV Piggyback:50] Out: 2900 [Urine:2900] Intake/Output this shift:     Recent Labs  08/09/15 1003 08/10/15 0550 08/11/15 0613  HGB 12.3 11.5* 9.0*    Recent Labs  08/10/15 0550 08/11/15 0613  WBC 12.4* 6.6  RBC 3.81* 3.01*  HCT 33.4* 27.1*  PLT 158 137*    Recent Labs  08/10/15 0550 08/11/15 0749  NA 136 141  K 3.4* 3.8  CL 99* 106  CO2 25 24  BUN 39* 43*  CREATININE 4.16* 3.61*  GLUCOSE 163* 129*  CALCIUM 8.0* 7.9*   No results for input(s): LABPT, INR in the last 72 hours.  Patient is alert and in no apparent distress Chest she has some bibasilar inspiratory crackles no wheezing. Heart exam reveals regular rate and rhythm. No murmur Extremities no edema  Assessment/Plan: Problem #1 acute kidney injury: Presently her BUN and creatinine seems to be improving. Patient is nonoliguric and she had about 2900 mL of urine output. Problem #2 hypokalemia: Her potassium has corrected Problem #3 hypertension: Her blood pressure is reasonably controlled Problem #4 anemia: Her hemoglobin and hematocrit is declining. Presently patient denies any history of GI bleeding. Problem #5 history of non-Hodgkin lymphoma and follicular lymphoma. Presently patient is on chemotherapy as an outpatient. Problem #6 chronic renal failure: Stage III. Problem #7 history of COPD Plan: We'll continue his present management We'll check her basic metabolic panel in the  morning.   Ronelle Michie S 08/11/2015, 9:51 AM

## 2015-08-11 NOTE — Progress Notes (Signed)
Patient on vancomycin and Maxipime and Levaquin for new or right upper and right middle lobe infiltrates she likewise has acute on chronic renal insufficiency diffuse for arterial coronary artery disease EF 35% history of lymphoma severe COPD pack and a half per day smoker with globin 9.0 [name] [id number] DOB: May 31, 1956 DOA: 08/09/2015 PCP: [name], MD             Physical Exam: Blood pressure 119/59, pulse 76, temperature 98.3 F (36.8 C), temperature source Oral, resp. rate 16, height 5\' 4"  (1.626 m), weight 127 lb 6.8 oz (57.8 kg), SpO2 96 %. lungs show prolonged intrauterine x-ray phase scattered rhonchi mild end expiratory wheeze noted bilaterally heart regular rhythm no S3-S4 no heaves thrills rubs abdomen soft nontender bowel sounds normoactive   Investigations:  Recent Results (from the past 240 hour(s))  MRSA PCR Screening     Status: None   Collection Time: 08/09/15 11:45 AM  Result Value Ref Range Status   MRSA by PCR NEGATIVE NEGATIVE Final    Comment:        The GeneXpert MRSA Assay (FDA approved for NASAL specimens only), is one component of a comprehensive MRSA colonization surveillance program. It is not intended to diagnose MRSA infection nor to guide or monitor treatment for MRSA infections.      Basic Metabolic Panel:  Recent Labs  08/09/15 1003 08/10/15 0550 08/11/15 0749  NA 127* 136 141  K 2.1* 3.4* 3.8  CL 83* 99* 106  CO2 27 25 24   GLUCOSE 114* 163* 129*  BUN 32* 39* 43*  CREATININE 4.42* 4.16* 3.61*  CALCIUM 7.9* 8.0* 7.9*  MG 1.4*  --   --    Liver Function Tests:  Recent Labs  08/09/15 1003  AST 32  ALT 20  ALKPHOS 176*  BILITOT 0.7  PROT 6.6  ALBUMIN 2.7*     CBC:  Recent Labs  08/09/15 1003 08/10/15 0550 08/11/15 0613  WBC 13.4* 12.4* 6.6  NEUTROABS 12.1*  --  6.3  HGB 12.3 11.5* 9.0*  HCT 35.5* 33.4* 27.1*  MCV 87.0 87.7 90.0  PLT 171 158 137*    Dg Chest 2 View  08/09/2015   CLINICAL DATA:  60 year old with personal history of treated non-Hodgkin's lymphoma,, including pulmonary parenchymal involvement, presenting with 1 week history of fever and acute onset of shortness of breath earlier today. Hypoxia upon clinical examination earlier today. 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 EXAM: CHEST  2 VIEW COMPARISON:  PET-CT 08/20/2014 and earlier. Chest x-rays 08/12/2014 and earlier. FINDINGS: Left subclavian Port-A-Cath tip remains in the mid SVC. Cardiac silhouette normal in size, unchanged. Thoracic aorta mildly atherosclerotic, unchanged. Since the most recent examinations 1 year ago, development of right hilar enlargement and development of diffuse interstitial pulmonary opacities throughout both lungs. Patchy airspace opacities in the right middle lobe and right upper lobe. Chronic scarring at the site of the prior left lower lobe biopsy. No pleural effusions. No pneumothorax. Visualized bony thorax intact. IMPRESSION: Since the chest x-ray and PET-CT in February, 2016: 1. New diffuse interstitial opacities throughout both lungs with associated patchy airspace opacities in the right middle lobe and right upper lobe. Recurrent pulmonary lymphoma, atypical infection, recurrent Langerhans histiocytosis and chemotherapy induced pulmonary disease with superimposed infection are diagnostic considerations. 2. New right hilar enlargement worrisome for lymphadenopathy. No evidence of lymphadenopathy elsewhere. Electronically Signed   By: Evangeline Dakin M.D.   On: 08/09/2015 10:17   US Renal  08/11/2015  CLINICAL DATA:  ATN.  Chronic kidney disease. EXAM: RENAL / URINARY TRACT ULTRASOUND COMPLETE COMPARISON:  CT abdomen pelvis 06/09/2015 FINDINGS: Right Kidney: Length: 11.3 cm. Renal parenchyma is echogenic. No focal renal lesion or hydronephrosis. Left Kidney: Length: 10.9 cm. Renal parenchyma echogenic. No focal renal lesion or hydronephrosis. Bladder: Appears normal  for degree of bladder distention. Bilateral ureteral jets are present. IMPRESSION: Renal parenchyma is echogenic bilaterally.  No hydronephrosis. Electronically Signed   By: Nolon Nations M.D.   On: 08/11/2015 08:51      Medications:   Impression:  Principal Problem:   Acute respiratory failure with hypoxia ([facility]) Active Problems:   HYPERTENSION, BENIGN   CAD, NATIVE VESSEL   HCAP (healthcare-associated pneumonia)   COPD with exacerbation ([facility])   Acute kidney injury ([place])   Hypokalemia   Hyponatremia   Lymphocytic lymphoma ([facility])   Protein-calorie malnutrition, severe     Plan: Continue vancomycin Levaquin and Maxipime as per protocol continue Solu-Medrol 125 IV every 8 hours awaiting renal consultation and further diagnostic tests continue nebulizer therapy anemia profile today  Consultants: Nephrology and pulmonology   Procedures   Antibiotics: Cefepime vancomycin and Levaquin                  Code Status: Full  Family Communication:    Disposition Plan see plan above  Time spent: 45 minutes   LOS: 2 days   Jeet Shough M   08/11/2015, 9:39 AM

## 2015-08-11 NOTE — Care Management (Signed)
Patient requires frequent re-positioning of the body in ways that cannot be achieved with an ordinary bed or wedge pillow, to eliminate pain and the head of there bed needs to be elevated more than 30 degrees most of the time.

## 2015-08-12 ENCOUNTER — Ambulatory Visit (HOSPITAL_COMMUNITY): Payer: Medicare HMO | Admitting: Hematology & Oncology

## 2015-08-12 LAB — GLUCOSE, CAPILLARY
GLUCOSE-CAPILLARY: 126 mg/dL — AB (ref 65–99)
GLUCOSE-CAPILLARY: 159 mg/dL — AB (ref 65–99)
Glucose-Capillary: 101 mg/dL — ABNORMAL HIGH (ref 65–99)

## 2015-08-12 LAB — BASIC METABOLIC PANEL
ANION GAP: 11 (ref 5–15)
BUN: 44 mg/dL — ABNORMAL HIGH (ref 6–20)
CHLORIDE: 108 mmol/L (ref 101–111)
CO2: 23 mmol/L (ref 22–32)
Calcium: 8.1 mg/dL — ABNORMAL LOW (ref 8.9–10.3)
Creatinine, Ser: 3.53 mg/dL — ABNORMAL HIGH (ref 0.44–1.00)
GFR calc Af Amer: 15 mL/min — ABNORMAL LOW (ref >60)
GFR, EST NON AFRICAN AMERICAN: 13 mL/min — AB (ref >60)
GLUCOSE: 169 mg/dL — AB (ref 65–99)
POTASSIUM: 4.1 mmol/L (ref 3.5–5.1)
Sodium: 142 mmol/L (ref 135–145)

## 2015-08-12 NOTE — Care Management Important Message (Signed)
Important Message  Patient Details  Name: [name] MRN: [id number] Date of Birth: Mar 27, 1956   Medicare Important Message Given:  Yes    Alvie Heidelberg, RN 08/12/2015, 9:32 AM

## 2015-08-12 NOTE — Progress Notes (Signed)
Subjective: Patient is still with dry cough. She denies any difficulty breathing. Her appetite is good and overall she is getting better.   Objective: Vital signs in last 24 hours: Temp:  [97.8 F (36.6 C)] 97.8 F (36.6 C) (02/09 2206) Pulse Rate:  [84-86] 86 (02/10 0610) Resp:  [18-20] 18 (02/09 2206) BP: (113-142)/(55-71) 113/61 mmHg (02/10 0610) SpO2:  [93 %-96 %] 95 % (02/10 0610) Weight:  [127 lb 6.4 oz (57.788 kg)] 127 lb 6.4 oz (57.788 kg) (02/10 0610)  Intake/Output from previous day: 02/09 0701 - 02/10 0700 In: 840 [P.O.:840] Out: -  Intake/Output this shift:     Recent Labs  08/09/15 1003 08/10/15 0550 08/11/15 0613  HGB 12.3 11.5* 9.0*    Recent Labs  08/10/15 0550 08/11/15 0613 08/11/15 0954  WBC 12.4* 6.6  --   RBC 3.81* 3.01* 3.62*  HCT 33.4* 27.1*  --   PLT 158 137*  --     Recent Labs  08/11/15 0749 08/12/15 0633  NA 141 142  K 3.8 4.1  CL 106 108  CO2 24 23  BUN 43* 44*  CREATININE 3.61* 3.53*  GLUCOSE 129* 169*  CALCIUM 7.9* 8.1*   No results for input(s): LABPT, INR in the last 72 hours.  Patient is alert and in no apparent distress Chest she has some bibasilar inspiratory crackles no wheezing. Heart exam reveals regular rate and rhythm. No murmur Extremities no edema  Assessment/Plan: Problem #1 acute kidney injury: Her renal function continued to improve. Still her creatinine is above her baseline. Patient is asymptomatic.[name] Patient is nonoliguric and she had about 2900 mL of urine output. Problem #2 hypokalemia: Her potassium has corrected. Patient is on potassium supplement. Problem #3 hypertension: Her blood pressure is reasonably controlled Problem #4 anemia: Her hemoglobin and hematocrit is low but stable. At this moment her iron saturation and ferritin is normal. Hence this could be secondary to chronic disease  Problem #5 history of non-Hodgkin lymphoma and follicular lymphoma. Presently patient is on chemotherapy as an  outpatient. Problem #6 chronic renal failure: Stage III. Problem #7 history of COPD Plan: We'll continue his present management We'll check her basic metabolic panel in the morning.   Koda Routon S 08/12/2015, 8:58 AM

## 2015-08-12 NOTE — Progress Notes (Signed)
Patient continues vancomycin cefepime and Levaquin triple antibiotics for right upper lobe and right middle lobe infiltrate in the face of severe COPD and acute on chronic renal insufficiency due to prerenal component assumedly retina and dropped from 3. 63.53 today nephrology consult appreciated she currently on Solu-Medrol 125 IV every 8 hours [name] [id number] DOB: 1955/07/17 DOA: 08/09/2015 PCP: [name], MD             Physical Exam: Blood pressure 113/61, pulse 86, temperature 97.8 F (36.6 C), temperature source Oral, resp. rate 18, height 5\' 4"  (1.626 m), weight 127 lb 6.4 oz (57.788 kg), SpO2 95 %. lungs show scattered rhonchi prolonged end expiratory phase diminished breath sounds in the bases no rales appreciable heart regular rhythm no S3-S4 no heaves thrills rubs abdomen soft nontender bowel sounds normoactive   Investigations:  Recent Results (from the past 240 hour(s))  MRSA PCR Screening     Status: None   Collection Time: 08/09/15 11:45 AM  Result Value Ref Range Status   MRSA by PCR NEGATIVE NEGATIVE Final    Comment:        The GeneXpert MRSA Assay (FDA approved for NASAL specimens only), is one component of a comprehensive MRSA colonization surveillance program. It is not intended to diagnose MRSA infection nor to guide or monitor treatment for MRSA infections.      Basic Metabolic Panel:  Recent Labs  08/11/15 0749 08/12/15 0633  NA 141 142  K 3.8 4.1  CL 106 108  CO2 24 23  GLUCOSE 129* 169*  BUN 43* 44*  CREATININE 3.61* 3.53*  CALCIUM 7.9* 8.1*   Liver Function Tests: No results for input(s): AST, ALT, ALKPHOS, BILITOT, PROT, ALBUMIN in the last 72 hours.   CBC:  Recent Labs  08/10/15 0550 08/11/15 0613  WBC 12.4* 6.6  NEUTROABS  --  6.3  HGB 11.5* 9.0*  HCT 33.4* 27.1*  MCV 87.7 90.0  PLT 158 137*    US Renal  08/11/2015  CLINICAL DATA:  ATN.  Chronic kidney disease. EXAM: RENAL / URINARY TRACT  ULTRASOUND COMPLETE COMPARISON:  CT abdomen pelvis 06/09/2015 FINDINGS: Right Kidney: Length: 11.3 cm. Renal parenchyma is echogenic. No focal renal lesion or hydronephrosis. Left Kidney: Length: 10.9 cm. Renal parenchyma echogenic. No focal renal lesion or hydronephrosis. Bladder: Appears normal for degree of bladder distention. Bilateral ureteral jets are present. IMPRESSION: Renal parenchyma is echogenic bilaterally.  No hydronephrosis. Electronically Signed   By: Nolon Nations M.D.   On: 08/11/2015 08:51      Medications:   Impression: Chronic kidney disease stage IV Principal Problem:   Acute respiratory failure with hypoxia (HCC) Active Problems:   HYPERTENSION, BENIGN   CAD, NATIVE VESSEL   HCAP (healthcare-associated pneumonia)   COPD with exacerbation (HCC)   Acute kidney injury ([place])   Hypokalemia   Hyponatremia   Lymphocytic lymphoma (HCC)   Protein-calorie malnutrition, severe     Plan: Continue Solu-Medrol 125 IV every 8 hours continue nebulizer therapy continue vancomycin cefepime and Levaquin monitor renal status daily   Consultants: Nephrology   Procedures   Antibiotics: Vancomycin and Levaquin and cefepime                  Code Status: Full   Family Communication:    Disposition Plan see plan above  Time spent: He minutes   LOS: 3 days   Kearney Evitt M   08/12/2015, 12:50 PM

## 2015-08-12 NOTE — Progress Notes (Signed)
Pharmacy Antibiotic Note  [name] is a 60 y.o. female admitted on 08/09/2015 with pneumonia.  Pharmacy has been consulted for Vancomycin, Cefepime and levaquin dosing.  Plan: Patient admitted with bilateral PNA and respiratory failure. She has h/o COPD, lymphoma, and h/o vulvar CA and CRF. Patient is improving. Vancomycin 750mg  IV every 48 hours.  Goal trough 15-20 mcg/mL. Levaquin 500mg  IV q48hour Cefepime 1gm IV q24h After discussion with MD, plan is to deescalate therapy to levaquin. Height: 5\' 4"  (162.6 cm) Weight: 127 lb 6.4 oz (57.788 kg) IBW/kg (Calculated) : 54.7  Temp (24hrs), Avg:97.8 F (36.6 C), Min:97.8 F (36.6 C), Max:97.8 F (36.6 C)   Recent Labs Lab 08/09/15 1003 08/10/15 0550 08/11/15 0613 08/11/15 0749 08/12/15 0633  WBC 13.4* 12.4* 6.6  --   --   CREATININE 4.42* 4.16*  --  3.61* 3.53*    Estimated Creatinine Clearance: 15.2 mL/min (by C-G formula based on Cr of 3.53).    Allergies  Allergen Reactions  . Morphine And Related Other (See Comments)    Unknown reaction   . Naproxen     Made me feel "loopy" in the head    Antimicrobials this admission: Vancomcyin 2/7>>2/10 Cefepime2/7>>2/10 Levaquin 2/7>>  Dose adjustments this admission: None thus far  Microbiology results: 2/07 MRSA PCR: negative  Thank you for allowing pharmacy to be a part of this patient's care.  Isac Sarna, BS Pharm D, BCPS Clinical Pharmacist Pager [phone] 08/12/2015 1:14 PM

## 2015-08-12 NOTE — Evaluation (Signed)
Physical Therapy Evaluation Patient Details Name: [name] MRN: [id number] DOB: December 24, 1955 Today's Date: 08/12/2015   History of Present Illness  60yo white female who comes to [facility] after worsening SOB and hypoxia noticed during a chemo treatment here at [facility]. Pt is currently receiving 2.5 L O2 nasal canula. At baseline pt is fully Indep.           Clinical Impression   Pt is awake, pleasant and in no acute distress. A&Ox3. Pt reports she has had 0 falls in the past 6 months and is previously living alone indep with all activities. Pt strength was assessed by MMT and is grossly 4/5 throughout the UE and LE. Functionally, she demonstrates independence as evident by her report of going to the bathroom independently throughout the day. She also demonstrated the ability to ambulate without assistance through the hospital for ~215ft. SaO2 remained in the 90s during ambulation with 2L nasal canula, and remained in the 90s during standing and sitting activities without O2 supplementation. This was discussed with the RN. At this time, the patient reports she is functioning at her baseline level and is demonstrating indep with current ambulation and mobility needs and would not benefit from skilled intervention. PT provided education on the importance of deep breathing during activity to improve SaO2.              Follow Up Recommendations No PT follow up    Equipment Recommendations  None recommended by PT    Recommendations for Other Services       Precautions / Restrictions Restrictions Weight Bearing Restrictions: No      Mobility  Bed Mobility Overal bed mobility: Independent             General bed mobility comments: Pt reports she has been getting up and going to the bathroom on her own without difficulty  Transfers Overall transfer level: Independent Equipment used: None                Ambulation/Gait Ambulation/Gait  assistance: Independent Ambulation Distance (Feet): 250 Feet Assistive device: None Gait Pattern/deviations: WFL(Within Functional Limits)     General Gait Details: Pt able to indep maneuver her IV pole during ambulation  Stairs            Wheelchair Mobility    Modified Rankin (Stroke Patients Only)       Balance Overall balance assessment: Independent                                           Pertinent Vitals/Pain Pain Assessment: No/denies pain    Home Living Family/patient expects to be discharged to:: Private residence Living Arrangements: Alone Available Help at Discharge: Family;Neighbor Type of Home: Mobile home Home Access: Level entry (ramp to enter home)     Home Layout: One level Home Equipment: None      Prior Function Level of Independence: Independent               Hand Dominance        Extremity/Trunk Assessment   Upper Extremity Assessment: Overall WFL for tasks assessed;Generalized weakness           Lower Extremity Assessment: Overall WFL for tasks assessed;Generalized weakness         Communication   Communication: No difficulties  Cognition Arousal/Alertness: Awake/alert Behavior During Therapy: WFL for  tasks assessed/performed Overall Cognitive Status: Within Functional Limits for tasks assessed                      General Comments General comments (skin integrity, edema, etc.): Good static/dynamic sitting balance; Good static standing balance with feet together, EO/EC and feet apart EC with decreased postural sway noted throughout.      Exercises General Exercises - Lower Extremity Long Arc Quad: AROM;Both;15 reps (Sitting in chair ) Hip ABduction/ADduction: AROM;Both;15 reps (Sitting in chair) Hip Flexion/Marching: AROM;15 reps (Sitting in chair) Toe Raises: AROM;Both;15 reps (Sitting in chair) Heel Raises: AROM;Both;15 reps (Sitting in chair)      Assessment/Plan    PT  Assessment Patent does not need any further PT services  PT Diagnosis Generalized weakness   PT Problem List    PT Treatment Interventions     PT Goals (Current goals can be found in the Care Plan section)      Frequency     Barriers to discharge        Co-evaluation               End of Session Equipment Utilized During Treatment: Gait belt Activity Tolerance: Patient tolerated treatment well;No increased pain Patient left: in chair;with call bell/phone within reach;with nursing/sitter in room Nurse Communication: Other (comment) (Discussed O2 sat levels with nurse)         [id number] PT Time Calculation (min) (ACUTE ONLY): 44 min   Charges:   PT Evaluation $PT Eval Low Complexity: 1 Procedure PT Treatments $Therapeutic Exercise: 8-22 mins $Therapeutic Activity: 23-37 mins   PT G Codes:       10:35 AM,Aug 28, 2015 Elly Modena PT, DPT [facility] Outpatient Physical Therapy [phone]

## 2015-08-13 LAB — BASIC METABOLIC PANEL
Anion gap: 10 (ref 5–15)
BUN: 44 mg/dL — ABNORMAL HIGH (ref 6–20)
CALCIUM: 7.9 mg/dL — AB (ref 8.9–10.3)
CO2: 25 mmol/L (ref 22–32)
CREATININE: 2.79 mg/dL — AB (ref 0.44–1.00)
Chloride: 108 mmol/L (ref 101–111)
GFR, EST AFRICAN AMERICAN: 21 mL/min — AB (ref >60)
GFR, EST NON AFRICAN AMERICAN: 18 mL/min — AB (ref >60)
Glucose, Bld: 113 mg/dL — ABNORMAL HIGH (ref 65–99)
Potassium: 4.7 mmol/L (ref 3.5–5.1)
SODIUM: 143 mmol/L (ref 135–145)

## 2015-08-13 MED ORDER — METHYLPREDNISOLONE SODIUM SUCC 125 MG IJ SOLR
80.0000 mg | Freq: Two times a day (BID) | INTRAMUSCULAR | Status: DC
  Administered 2015-08-13 – 2015-08-15 (×5): 80 mg via INTRAVENOUS
  Filled 2015-08-13 (×5): qty 2

## 2015-08-13 NOTE — Progress Notes (Signed)
Patient is less respiratory distress renal function improving from 3.5 creatinine of 2.8 she a renal consult increase Solu-Medrol to 80 IV every 8 hours [name] [id number] DOB: [birth year] DOA: 08/09/2015 PCP: [name], MD             Physical Exam: Blood pressure 103/56, pulse 82, temperature 98.4 F (36.9 C), temperature source Oral, resp. rate 18, height 5\' 4"  (1.626 m), weight 127 lb 6.4 oz (57.788 kg), SpO2 95 %. lungs minimal expiratory wheeze scattered rhonchi no rales audible heart regular rhythm no S3-S4 no heaves thrills rubs abdomen soft nontender bowel sounds normoactive   Investigations:  Recent Results (from the past 240 hour(s))  MRSA PCR Screening     Status: None   Collection Time: 08/09/15 11:45 AM  Result Value Ref Range Status   MRSA by PCR NEGATIVE NEGATIVE Final    Comment:        The GeneXpert MRSA Assay (FDA approved for NASAL specimens only), is one component of a comprehensive MRSA colonization surveillance program. It is not intended to diagnose MRSA infection nor to guide or monitor treatment for MRSA infections.      Basic Metabolic Panel:  Recent Labs  08/12/15 0633 08/13/15 0610  NA 142 143  K 4.1 4.7  CL 108 108  CO2 23 25  GLUCOSE 169* 113*  BUN 44* 44*  CREATININE 3.53* 2.79*  CALCIUM 8.1* 7.9*   Liver Function Tests: No results for input(s): AST, ALT, ALKPHOS, BILITOT, PROT, ALBUMIN in the last 72 hours.   CBC:  Recent Labs  08/11/15 0613  WBC 6.6  NEUTROABS 6.3  HGB 9.0*  HCT 27.1*  MCV 90.0  PLT 137*    No results found.    Medications:  Impression:  Principal Problem:   Acute respiratory failure with hypoxia (HCC) Active Problems:   HYPERTENSION, BENIGN   CAD, NATIVE VESSEL   HCAP (healthcare-associated pneumonia)   COPD with exacerbation (HCC)   Acute kidney injury ([place])   Hypokalemia   Hyponatremia   Lymphocytic lymphoma (HCC)   Protein-calorie malnutrition,  severe     Plan: Decrease Solu-Medrol to 80 IV every 8 hours. Monitor renal function in a.m.  Consultants: Nephrology   Procedures   Antibiotics:                   Code Status: Full   Family Communication:    Disposition Plan see plan above  Time spent: 30 minutes   LOS: 4 days   Oscar Hank M   08/13/2015, 2:12 PM

## 2015-08-13 NOTE — Progress Notes (Signed)
Subjective: Patient still has some cough but feels much better. She denies any difficulty breathing. Her appetite is good.  Objective: Vital signs in last 24 hours: Temp:  [97.5 F (36.4 C)-98.4 F (36.9 C)] 98.4 F (36.9 C) (02/11 0603) Pulse Rate:  [82-92] 82 (02/11 0603) Resp:  [18-20] 18 (02/11 0603) BP: (103-105)/(53-56) 103/56 mmHg (02/11 0603) SpO2:  [95 %-99 %] 98 % (02/11 0603)  Intake/Output from previous day: 02/10 0701 - 02/11 0700 In: 600 [P.O.:600] Out: 1100 [Urine:1100] Intake/Output this shift:     Recent Labs  08/11/15 0613  HGB 9.0*    Recent Labs  08/11/15 0613 08/11/15 0954  WBC 6.6  --   RBC 3.01* 3.62*  HCT 27.1*  --   PLT 137*  --     Recent Labs  08/12/15 0633 08/13/15 0610  NA 142 143  K 4.1 4.7  CL 108 108  CO2 23 25  BUN 44* 44*  CREATININE 3.53* 2.79*  GLUCOSE 169* 113*  CALCIUM 8.1* 7.9*   No results for input(s): LABPT, INR in the last 72 hours.  Patient is alert and in no apparent distress Chest : She has bilateral wheezing posteriorly Heart exam reveals regular rate and rhythm. No murmur Extremities no edema  Assessment/Plan: Problem #1 acute kidney injury: Her renal function continued to improve. Presently her BUN and creatinine is still above her baseline. She had about 1100 mL of urine output Problem #2 hypokalemia: [name] on potassium supplement. Her potassium is normal. Problem #3 hypertension: Her blood pressure is reasonably controlled Problem #4 anemia: Her hemoglobin is below our target goal. This could be multifactorial. Problem #5 history of non-Hodgkin lymphoma and follicular lymphoma. Presently patient is on chemotherapy as an outpatient. Problem #6 chronic renal failure: Stage III. Problem #7 history of COPD Plan: We'll decrease IV fluid to 75 mL per hour We'll check her basic metabolic panel in the morning.   Karlin Heilman S 08/13/2015, 9:08 AM

## 2015-08-14 LAB — BASIC METABOLIC PANEL
Anion gap: 9 (ref 5–15)
BUN: 46 mg/dL — AB (ref 6–20)
CHLORIDE: 106 mmol/L (ref 101–111)
CO2: 26 mmol/L (ref 22–32)
Calcium: 7.6 mg/dL — ABNORMAL LOW (ref 8.9–10.3)
Creatinine, Ser: 2.22 mg/dL — ABNORMAL HIGH (ref 0.44–1.00)
GFR calc Af Amer: 27 mL/min — ABNORMAL LOW (ref >60)
GFR calc non Af Amer: 23 mL/min — ABNORMAL LOW (ref >60)
GLUCOSE: 166 mg/dL — AB (ref 65–99)
POTASSIUM: 4.8 mmol/L (ref 3.5–5.1)
Sodium: 141 mmol/L (ref 135–145)

## 2015-08-14 NOTE — Progress Notes (Signed)
Subjective: Patient is feeling much better. She has on and off cough without sputum production. She denies any difficulty breathing.  Objective: Vital signs in last 24 hours: Temp:  [97.6 F (36.4 C)-98.6 F (37 C)] 97.6 F (36.4 C) (02/12 [id number]) Pulse Rate:  [94-100] 100 (02/11 2115) Resp:  [20-21] 20 (02/12 0608) BP: (100-110)/(54-60) 110/60 mmHg (02/12 0608) SpO2:  [91 %-97 %] 97 % (02/12 0608) Weight:  [130 lb 8.2 oz (59.2 kg)] 130 lb 8.2 oz (59.2 kg) (02/12 0500)  Intake/Output from previous day: 02/11 0701 - 02/12 0700 In: 720 [P.O.:720] Out: 1400 [Urine:1400] Intake/Output this shift:    No results for input(s): HGB in the last 72 hours.  Recent Labs  08/11/15 0954  RBC 3.62*    Recent Labs  08/13/15 0610 08/14/15 0700  NA 143 141  K 4.7 4.8  CL 108 106  CO2 25 26  BUN 44* 46*  CREATININE 2.79* 2.22*  GLUCOSE 113* 166*  CALCIUM 7.9* 7.6*   No results for input(s): LABPT, INR in the last 72 hours.  Patient is alert and in no apparent distress Chest : She has bilateral wheezing posteriorly Heart exam reveals regular rate and rhythm. No murmur Extremities no edema  Assessment/Plan: Problem #1 acute kidney injury: Her renal function continued to improve. Presently she is asymptomatic. She has 1400 mL of urine output and no sign of fluid overload. Her creatinine is still above her baseline. Problem #2 hypokalemia: [name] on potassium supplement. Her potassium has corrected. Problem #3 hypertension: Her blood pressure is reasonably controlled Problem #4 anemia: Her hemoglobin is below our target goal. This could be multifactorial. Problem #5 history of non-Hodgkin lymphoma and follicular lymphoma. Presently patient is on chemotherapy as an outpatient. Problem #6 chronic renal failure: Stage III. Problem #7 history of COPD Plan: 1] We'll DC IV fluid 2] we encourage patient to increase her fluid intake 3] We'll check her basic metabolic panel in the  morning.   [name] S 08/14/2015, 8:58 AM

## 2015-08-14 NOTE — Progress Notes (Signed)
Patient had bilateral infiltrates and right middle lobe right upper lobe and patchy infiltrates in left lobe currently on IV Levaquin she had acute on chronic renal insult initial creatinine 3.53 now down to 2.2 to this is improving she had her Solu-Medrol decreased yesterday from 125-80 mg every 8 hours still has some moderate wheezing will leave this at current dosage [name] [id number] DOB: Aug 04, 1955 DOA: 08/09/2015 PCP: [name], MD             Physical Exam: Blood pressure 110/60, pulse 100, temperature 97.6 F (36.4 C), temperature source Oral, resp. rate 20, height 5\' 4"  (1.626 m), weight 130 lb 8.2 oz (59.2 kg), SpO2 97 %. no JVD no carotid bruits no thyromegaly lungs show diminished breath sounds at bases mild prolonged expiratory wheeze scattered rhonchi no rales audible heart regular rhythm no S3-S4 no heaves thrills rubs abdomen soft nontender bowel sounds normoactive   Investigations:  Recent Results (from the past 240 hour(s))  MRSA PCR Screening     Status: None   Collection Time: 08/09/15 11:45 AM  Result Value Ref Range Status   MRSA by PCR NEGATIVE NEGATIVE Final    Comment:        The GeneXpert MRSA Assay (FDA approved for NASAL specimens only), is one component of a comprehensive MRSA colonization surveillance program. It is not intended to diagnose MRSA infection nor to guide or monitor treatment for MRSA infections.      Basic Metabolic Panel:  Recent Labs  08/13/15 0610 08/14/15 0700  NA 143 141  K 4.7 4.8  CL 108 106  CO2 25 26  GLUCOSE 113* 166*  BUN 44* 46*  CREATININE 2.79* 2.22*  CALCIUM 7.9* 7.6*   Liver Function Tests: No results for input(s): AST, ALT, ALKPHOS, BILITOT, PROT, ALBUMIN in the last 72 hours.   CBC: No results for input(s): WBC, NEUTROABS, HGB, HCT, MCV, PLT in the last 72 hours.  No results found.    Medications:   Impression: Acute renal failure on chronic renal failure  Principal  Problem:   Acute respiratory failure with hypoxia (HCC) Active Problems:   HYPERTENSION, BENIGN   CAD, NATIVE VESSEL   HCAP (healthcare-associated pneumonia)   COPD with exacerbation (HCC)   Acute kidney injury ([place])   Hypokalemia   Hyponatremia   Lymphocytic lymphoma (HCC)   Protein-calorie malnutrition, severe     Plan: Monitor renal function in a.m. continue IV Levaquin as ordered continue Solu-Medrol 80 IV every 8 hours continue nebulizer therapy mobilize patient  Consultants: Nephrology   Procedures   Antibiotics: IV Levaquin                  Code Status:  Family Communication:  Spoke with patient  Disposition Plan see plan above  Time spent: 30 minutes   LOS: 5 days   [name] M   08/14/2015, 1:25 PM

## 2015-08-15 LAB — GLUCOSE, CAPILLARY
GLUCOSE-CAPILLARY: 122 mg/dL — AB (ref 65–99)
GLUCOSE-CAPILLARY: 87 mg/dL (ref 65–99)
GLUCOSE-CAPILLARY: 115 mg/dL — AB (ref 65–99)
GLUCOSE-CAPILLARY: 207 mg/dL — AB (ref 65–99)
GLUCOSE-CAPILLARY: 145 mg/dL — AB (ref 65–99)
GLUCOSE-CAPILLARY: 155 mg/dL — AB (ref 65–99)
GLUCOSE-CAPILLARY: 112 mg/dL — AB (ref 65–99)
GLUCOSE-CAPILLARY: 234 mg/dL — AB (ref 65–99)
GLUCOSE-CAPILLARY: 82 mg/dL (ref 65–99)
Glucose-Capillary: 113 mg/dL — ABNORMAL HIGH (ref 65–99)
Glucose-Capillary: 166 mg/dL — ABNORMAL HIGH (ref 65–99)
Glucose-Capillary: 109 mg/dL — ABNORMAL HIGH (ref 65–99)
Glucose-Capillary: 134 mg/dL — ABNORMAL HIGH (ref 65–99)
Glucose-Capillary: 153 mg/dL — ABNORMAL HIGH (ref 65–99)
Glucose-Capillary: 117 mg/dL — ABNORMAL HIGH (ref 65–99)

## 2015-08-15 LAB — BASIC METABOLIC PANEL
Anion gap: 10 (ref 5–15)
BUN: 50 mg/dL — AB (ref 6–20)
CHLORIDE: 101 mmol/L (ref 101–111)
CO2: 30 mmol/L (ref 22–32)
Calcium: 7.8 mg/dL — ABNORMAL LOW (ref 8.9–10.3)
Creatinine, Ser: 2.06 mg/dL — ABNORMAL HIGH (ref 0.44–1.00)
GFR calc Af Amer: 30 mL/min — ABNORMAL LOW (ref >60)
GFR calc non Af Amer: 26 mL/min — ABNORMAL LOW (ref >60)
GLUCOSE: 177 mg/dL — AB (ref 65–99)
POTASSIUM: 4.1 mmol/L (ref 3.5–5.1)
Sodium: 141 mmol/L (ref 135–145)

## 2015-08-15 MED ORDER — MAGIC MOUTHWASH
5.0000 mL | Freq: Four times a day (QID) | ORAL | Status: DC
  Administered 2015-08-15 – 2015-08-17 (×7): 5 mL via ORAL
  Filled 2015-08-15 (×7): qty 5

## 2015-08-15 NOTE — Progress Notes (Signed)
Subjective: Patient feels good. Her cough is much better. Presently she offers no complaints.  Objective: Vital signs in last 24 hours: Temp:  [98.2 F (36.8 C)-98.4 F (36.9 C)] 98.4 F (36.9 C) (02/13 0520) Pulse Rate:  [75-87] 75 (02/13 0520) Resp:  [20] 20 (02/13 0520) BP: (108-115)/(45-67) 108/45 mmHg (02/13 0520) SpO2:  [94 %-100 %] 97 % (02/13 0520)  Intake/Output from previous day: 02/12 0701 - 02/13 0700 In: 480 [P.O.:480] Out: 1100 [Urine:1100] Intake/Output this shift:    No results for input(s): HGB in the last 72 hours. No results for input(s): WBC, RBC, HCT, PLT in the last 72 hours.  Recent Labs  08/14/15 0700 08/15/15 0528  NA 141 141  K 4.8 4.1  CL 106 101  CO2 26 30  BUN 46* 50*  CREATININE 2.22* 2.06*  GLUCOSE 166* 177*  CALCIUM 7.6* 7.8*   No results for input(s): LABPT, INR in the last 72 hours.  Patient is alert and in no apparent distress Chest : She has bilateral wheezing posteriorly Heart exam reveals regular rate and rhythm. No murmur Extremities no edema  Assessment/Plan: Problem #1 acute kidney injury: Her renal function continued to improve. Her creatinine is 2.06 and presently reaching her baseline. Patient remains nonoliguric with 1100 mL of urine output. Problem #2 hypokalemia: Her potassium is normal. Problem #3 hypertension: Her blood pressure is reasonably controlled Problem #4 anemia: Her hemoglobin is below our target goal. This could be multifactorial. Problem #5 history of non-Hodgkin lymphoma and follicular lymphoma. Presently patient is on chemotherapy as an outpatient. Problem #6 chronic renal failure: Stage III. Problem #7 history of COPD Plan: 1] we'll continue his present management 2] if patient is going to discharge we'll see her in 4 weeks has an outpatient. 3] We'll check her basic metabolic panel in the morning.   Suhan Paci S 08/15/2015, 8:29 AM

## 2015-08-15 NOTE — Progress Notes (Signed)
[name] [id number] DOB: [birth year] DOA: 08/09/2015 PCP: [name], MD             Physical Exam: Blood pressure 108/45, pulse 75, temperature 98.4 F (36.9 C), temperature source Oral, resp. rate 20, height 5\' 4"  (1.626 m), weight 130 lb 8.2 oz (59.2 kg), SpO2 97 %. lungs show moderate expiratory wheeze diminished breath sounds in bases no rales appreciable heart regular rhythm no S3 or S4 no heaves thrills rubs   Investigations:  Recent Results (from the past 240 hour(s))  MRSA PCR Screening     Status: None   Collection Time: 08/09/15 11:45 AM  Result Value Ref Range Status   MRSA by PCR NEGATIVE NEGATIVE Final    Comment:        The GeneXpert MRSA Assay (FDA approved for NASAL specimens only), is one component of a comprehensive MRSA colonization surveillance program. It is not intended to diagnose MRSA infection nor to guide or monitor treatment for MRSA infections.      Basic Metabolic Panel:  Recent Labs  08/14/15 0700 08/15/15 0528  NA 141 141  K 4.8 4.1  CL 106 101  CO2 26 30  GLUCOSE 166* 177*  BUN 46* 50*  CREATININE 2.22* 2.06*  CALCIUM 7.6* 7.8*   Liver Function Tests: No results for input(s): AST, ALT, ALKPHOS, BILITOT, PROT, ALBUMIN in the last 72 hours.   CBC: No results for input(s): WBC, NEUTROABS, HGB, HCT, MCV, PLT in the last 72 hours.  No results found.    Medications:   Impression:  Principal Problem:   Acute respiratory failure with hypoxia (HCC) Active Problems:   HYPERTENSION, BENIGN   CAD, NATIVE VESSEL   HCAP (healthcare-associated pneumonia)   COPD with exacerbation (HCC)   Acute kidney injury ([place])   Hypokalemia   Hyponatremia   Lymphocytic lymphoma (HCC)   Protein-calorie malnutrition, severe     Plan: Continue Solu-Medrol 80 IV every 8 hours full discharge within 48 hours will transition to oral steroids in a.m. and continue to improve CBC and CMP today in a.m. renal function has  improved from 3.53-2.06  Consultants: Nephrology   Procedures   Antibiotics:                   Code Status: Full  Family Communication:  Spoke with patient  Disposition Plan   Time spent: 30 minutes   LOS: 6 days   Saliah Crisp M   08/15/2015, 12:35 PM

## 2015-08-16 LAB — CBC WITH DIFFERENTIAL/PLATELET
BASOS ABS: 0 10*3/uL (ref 0.0–0.1)
BASOS PCT: 0 %
EOS ABS: 0 10*3/uL (ref 0.0–0.7)
Eosinophils Relative: 0 %
HEMATOCRIT: 33.3 % — AB (ref 36.0–46.0)
Hemoglobin: 11.2 g/dL — ABNORMAL LOW (ref 12.0–15.0)
Lymphocytes Relative: 5 %
Lymphs Abs: 0.4 10*3/uL — ABNORMAL LOW (ref 0.7–4.0)
MCH: 30.4 pg (ref 26.0–34.0)
MCHC: 33.6 g/dL (ref 30.0–36.0)
MCV: 90.2 fL (ref 78.0–100.0)
MONO ABS: 0.2 10*3/uL (ref 0.1–1.0)
MONOS PCT: 2 %
NEUTROS ABS: 7.6 10*3/uL (ref 1.7–7.7)
Neutrophils Relative %: 93 %
PLATELETS: 146 10*3/uL — AB (ref 150–400)
RBC: 3.69 MIL/uL — ABNORMAL LOW (ref 3.87–5.11)
RDW: 16.3 % — AB (ref 11.5–15.5)
WBC: 8.2 10*3/uL (ref 4.0–10.5)

## 2015-08-16 LAB — COMPREHENSIVE METABOLIC PANEL
ALBUMIN: 2.5 g/dL — AB (ref 3.5–5.0)
ALT: 27 U/L (ref 14–54)
ANION GAP: 10 (ref 5–15)
AST: 24 U/L (ref 15–41)
Alkaline Phosphatase: 93 U/L (ref 38–126)
BILIRUBIN TOTAL: 0.5 mg/dL (ref 0.3–1.2)
BUN: 50 mg/dL — AB (ref 6–20)
CHLORIDE: 98 mmol/L — AB (ref 101–111)
CO2: 31 mmol/L (ref 22–32)
Calcium: 7.9 mg/dL — ABNORMAL LOW (ref 8.9–10.3)
Creatinine, Ser: 1.73 mg/dL — ABNORMAL HIGH (ref 0.44–1.00)
GFR calc Af Amer: 37 mL/min — ABNORMAL LOW (ref >60)
GFR calc non Af Amer: 32 mL/min — ABNORMAL LOW (ref >60)
GLUCOSE: 200 mg/dL — AB (ref 65–99)
POTASSIUM: 4.6 mmol/L (ref 3.5–5.1)
SODIUM: 139 mmol/L (ref 135–145)
TOTAL PROTEIN: 5.6 g/dL — AB (ref 6.5–8.1)

## 2015-08-16 LAB — GLUCOSE, CAPILLARY
GLUCOSE-CAPILLARY: 252 mg/dL — AB (ref 65–99)
GLUCOSE-CAPILLARY: 164 mg/dL — AB (ref 65–99)
Glucose-Capillary: 146 mg/dL — ABNORMAL HIGH (ref 65–99)
Glucose-Capillary: 206 mg/dL — ABNORMAL HIGH (ref 65–99)

## 2015-08-16 MED ORDER — LEVOFLOXACIN 750 MG PO TABS
750.0000 mg | ORAL_TABLET | ORAL | Status: DC
  Administered 2015-08-17: 750 mg via ORAL
  Filled 2015-08-16: qty 1

## 2015-08-16 MED ORDER — PREDNISONE 20 MG PO TABS
50.0000 mg | ORAL_TABLET | Freq: Every day | ORAL | Status: DC
  Administered 2015-08-16 – 2015-08-17 (×2): 50 mg via ORAL
  Filled 2015-08-16 (×2): qty 2

## 2015-08-16 NOTE — Progress Notes (Signed)
Subjective: Patient denies any nausea or vomiting. Her cough is getting much better. Presently she doesn't have any sputum production. Patient also denies any difficulty in breathing.  Objective: Vital signs in last 24 hours: Temp:  [98 F (36.7 C)-98.2 F (36.8 C)] 98 F (36.7 C) (02/14 0511) Pulse Rate:  [62-82] 62 (02/14 0511) Resp:  [15-20] 15 (02/14 0511) BP: (102-110)/(58-65) 104/63 mmHg (02/14 0511) SpO2:  [93 %-97 %] 93 % (02/14 0735) Weight:  [126 lb 3.2 oz (57.244 kg)] 126 lb 3.2 oz (57.244 kg) (02/14 0511)  Intake/Output from previous day: 02/13 0701 - 02/14 0700 In: 720 [P.O.:720] Out: 2150 [Urine:2150] Intake/Output this shift:     Recent Labs  08/16/15 0557  HGB 11.2*    Recent Labs  08/16/15 0557  WBC 8.2  RBC 3.69*  HCT 33.3*  PLT 146*    Recent Labs  08/15/15 0528 08/16/15 0557  NA 141 139  K 4.1 4.6  CL 101 98*  CO2 30 31  BUN 50* 50*  CREATININE 2.06* 1.73*  GLUCOSE 177* 200*  CALCIUM 7.8* 7.9*   No results for input(s): LABPT, INR in the last 72 hours.  Patient is alert and in no apparent distress Chest : She has bilateral wheezing posteriorly Heart exam reveals regular rate and rhythm. No murmur Extremities no edema  Assessment/Plan: Problem #1 acute kidney injury: Her renal function continued to improve. Her creatinine has come down to 1.73. Patient is asymptomatic. She had about 2100 mL of urine output. Presently she is off diuretics. Problem #2 hypokalemia: Her potassium is normal. Problem #3 hypertension: Her blood pressure is reasonably controlled Problem #4 anemia: Her hemoglobin is 11.2 which is better. Problem #5 history of non-Hodgkin lymphoma and follicular lymphoma. Presently patient is on chemotherapy as an outpatient. Problem #6 chronic renal failure: Stage III. Problem #7 history of COPD Plan: 1] we'll continue his present management 2] We'll check her basic metabolic panel in the morning.   [name]  S 08/16/2015, 8:24 AM

## 2015-08-16 NOTE — Progress Notes (Signed)
Solu-Medrol be discontinued today switched to prednisone 50 mg daily creatinine improved to 1.73  WBCs 8.2 hemoglobin 11.2 lungs show minimal end expiratory wheeze chest diminished breath sounds in bases [name] [id number] DOB: 1956/03/31 DOA: 08/09/2015 PCP: Tina Bogus, MD             Physical Exam: Blood pressure 104/63, pulse 62, temperature 98 F (36.7 C), temperature source Oral, resp. rate 15, height 5\' 4"  (1.626 Yu), weight 126 lb 3.2 oz (57.244 kg), SpO2 95 %. lungs diminished breath sounds in the bases minimal end expiratory wheeze no rales audible heart regular rhythm no S3 or S4 no heaves thrills rubs abdomen soft nontender bowel sounds normoactive   Investigations:  Recent Results (from the past 240 hour(s))  MRSA PCR Screening     Status: None   Collection Time: 08/09/15 11:45 AM  Result Value Ref Range Status   MRSA by PCR NEGATIVE NEGATIVE Final    Comment:        The GeneXpert MRSA Assay (FDA approved for NASAL specimens only), is one component of a comprehensive MRSA colonization surveillance program. It is not intended to diagnose MRSA infection nor to guide or monitor treatment for MRSA infections.      Basic Metabolic Panel:  Recent Labs  08/15/15 0528 08/16/15 0557  NA 141 139  K 4.1 4.6  CL 101 98*  CO2 30 31  GLUCOSE 177* 200*  BUN 50* 50*  CREATININE 2.06* 1.73*  CALCIUM 7.8* 7.9*   Liver Function Tests:  Recent Labs  08/16/15 0557  AST 24  ALT 27  ALKPHOS 93  BILITOT 0.5  PROT 5.6*  ALBUMIN 2.5*     CBC:  Recent Labs  08/16/15 0557  WBC 8.2  NEUTROABS 7.6  HGB 11.2*  HCT 33.3*  MCV 90.2  PLT 146*    No results found.    Medications:   Impression:  Principal Problem:   Acute respiratory failure with hypoxia (HCC) Active Problems:   HYPERTENSION, BENIGN   CAD, NATIVE VESSEL   HCAP (healthcare-associated pneumonia)   COPD with exacerbation (HCC)   Acute kidney injury ([place])  Hypokalemia   Hyponatremia   Lymphocytic lymphoma (HCC)   Protein-calorie malnutrition, severe     Plan: Continue IV Levaquin and switched to oral prednisone DC IV Solu-Medrol consider discharge in a.Yu. depending on clinical course  Consultants:     Procedures   Antibiotics: IV Levaquin                  Code Status:  Family Communication:    Disposition Plan see plan above  Time spent: 30 minutes   LOS: 7 days   [name]   08/16/2015, 7:34 AM

## 2015-08-16 NOTE — Care Management (Signed)
Spoke with patient again for continued assessment. Orders placed for hospital bed to be deliver to the home, Patient will need nebulizer and possibly for home O2. Will have nursing qualify patient. Patient alert and oriented and sitting in bed eating at time of interview. Lives at home alone with family support nearby. Has walker. Anticipate discharge tomorrow.

## 2015-08-16 NOTE — Progress Notes (Signed)
Pharmacy Antibiotic Note  [name] is a 60 y.o. female admitted on 08/09/2015 with pneumonia.  Pharmacy has been consulted forlevaquin dosing.  Plan: Patient admitted with bilateral PNA and respiratory failure. She has h/o COPD, lymphoma, and h/o vulvar CA and CRF. Patient is improving. Renal improving Change Levaquin to 750mg  PO q48hour   Height: 5\' 4"  (162.6 cm) Weight: 126 lb 3.2 oz (57.244 kg) IBW/kg (Calculated) : 54.7  Temp (24hrs), Avg:98.1 F (36.7 C), Min:98 F (36.7 C), Max:98.2 F (36.8 C)   Recent Labs Lab 08/10/15 0550 08/11/15 0613  08/12/15 0633 08/13/15 0610 08/14/15 0700 08/15/15 0528 08/16/15 0557  WBC 12.4* 6.6  --   --   --   --   --  8.2  CREATININE 4.16*  --   < > 3.53* 2.79* 2.22* 2.06* 1.73*  < > = values in this interval not displayed.  Estimated Creatinine Clearance: 31 mL/min (by C-G formula based on Cr of 1.73).    Allergies  Allergen Reactions  . Morphine And Related Other (See Comments)    Unknown reaction   . Naproxen     Made me feel "loopy" in the head    Antimicrobials this admission: Vancomcyin 2/7>>2/10 Cefepime2/7>>2/10 Levaquin 2/7>>  Dose adjustments this admission: None thus far  Microbiology results: 2/07 MRSA PCR: negative  Thank you for allowing pharmacy to be a part of this patient's care.  Isac Sarna, BS [place], [place] Clinical Pharmacist Pager [phone] 08/16/2015 3:23 PM

## 2015-08-17 LAB — BASIC METABOLIC PANEL WITH GFR
Anion gap: 10 (ref 5–15)
BUN: 47 mg/dL — ABNORMAL HIGH (ref 6–20)
CO2: 34 mmol/L — ABNORMAL HIGH (ref 22–32)
Calcium: 8.2 mg/dL — ABNORMAL LOW (ref 8.9–10.3)
Chloride: 97 mmol/L — ABNORMAL LOW (ref 101–111)
Creatinine, Ser: 1.56 mg/dL — ABNORMAL HIGH (ref 0.44–1.00)
GFR calc Af Amer: 42 mL/min — ABNORMAL LOW
GFR calc non Af Amer: 36 mL/min — ABNORMAL LOW
Glucose, Bld: 109 mg/dL — ABNORMAL HIGH (ref 65–99)
Potassium: 4 mmol/L (ref 3.5–5.1)
Sodium: 141 mmol/L (ref 135–145)

## 2015-08-17 LAB — GLUCOSE, CAPILLARY
GLUCOSE-CAPILLARY: 107 mg/dL — AB (ref 65–99)
Glucose-Capillary: 150 mg/dL — ABNORMAL HIGH (ref 65–99)

## 2015-08-17 MED ORDER — PREDNISONE 50 MG PO TABS: 50.0000 mg | ORAL_TABLET | Freq: Every day | ORAL | Status: DC

## 2015-08-17 MED ORDER — HEPARIN SOD (PORK) LOCK FLUSH 100 UNIT/ML IV SOLN
500.0000 [IU] | INTRAVENOUS | Status: AC | PRN
  Administered 2015-08-17: 500 [IU]
  Filled 2015-08-17: qty 5

## 2015-08-17 MED ORDER — ENSURE ENLIVE PO LIQD: 237.0000 mL | ORAL | Status: DC

## 2015-08-17 MED ORDER — ENOXAPARIN SODIUM 40 MG/0.4ML ~~LOC~~ SOLN: 40.0000 mg | SUBCUTANEOUS | Status: DC

## 2015-08-17 MED ORDER — ALBUTEROL SULFATE (2.5 MG/3ML) 0.083% IN NEBU: 2.5000 mg | INHALATION_SOLUTION | RESPIRATORY_TRACT | Status: DC | PRN

## 2015-08-17 NOTE — Care Management Important Message (Signed)
Important Message  Patient Details  Name: [name] MRN: [id number] Date of Birth: 21-Dec-1955   Medicare Important Message Given:  Yes    Alvie Heidelberg, RN 08/17/2015, 2:20 PM

## 2015-08-17 NOTE — Care Management (Signed)
Patient requires frequent re-positioning of their body in ways that cannot be achieved with an ordinary bed or wedge pillow, to eliminate pain and the head of the bed needs to be elevated more than 30 degrees most of the time.

## 2015-08-17 NOTE — Progress Notes (Signed)
Nutrition Follow-up  DOCUMENTATION CODES:   Severe malnutrition in context of chronic illness  INTERVENTION:  - Decrease Ensure Enlive to daily, 350 kcal, 20 grams of protein per bottle  NUTRITION DIAGNOSIS:   Increased nutrient needs related to chronic illness as evidenced by estimated needs, per patient/family report. -ongoing  GOAL:   Patient will meet greater than or equal to 90% of their needs -met  MONITOR:   PO intake, Supplement acceptance, Weight trends, Labs  ASSESSMENT:   [name] is a 59 y.o. female with a history of recurrent lymphocytic lymphoma-on chemotherapy, emphysema/COPD, Langerhans cell histiocytosis of lung, 2009, PVD, CAD, and hypertension, who [facility] for shortness of breath and hypoxia. The patient presented to oncology today for chemotherapy. She was found to be dyspneic and with an oxygen saturation in the 70s on room air. She was placed on 3 L of nasal cannula oxygen and her oxygen saturation increased to 93%. She reports a 2 day history of worsening shortness of breath and productive cough with yellow sputum.  Pt seen for follow up. Pt reports her appetite is great, she is consuming between 75-100% of meals. [name] drinking her Ensure BID. Since her PO intake has improved will decrease her Ensure to one time daily.   Pt reports no recent weight loss. Per chart weight is stable.  Medications reviewed. Labs reviewed.   Diet Order:  Diet Heart Room service appropriate?: Yes; Fluid consistency:: Thin  Skin:  Reviewed, no issues  Last BM:  08/16/2015  Height:   Ht Readings from Last 1 Encounters:  08/09/15 5' 4" (1.626 m)    Weight:   Wt Readings from Last 1 Encounters:  08/17/15 125 lb 14.8 oz (57.12 kg)    Ideal Body Weight:  54.5 kg  BMI:  Body mass index is 21.6 kg/(m^2).  Estimated Nutritional Needs:   Kcal:  1740-1915   Protein:  70-81 gr  Fluid:  1.7-1.9 liters  daily  EDUCATION NEEDS:   Education needs addressed  Brittany Kern, Dietetic Intern Pager: [phone]  

## 2015-08-17 NOTE — Progress Notes (Signed)
Discharged PT per MD order and protocol. Reviewed discharge teaching and handouts given. Pt verbalized understanding and left with all belongings. Prescriptions given and explained. Nebulizer machine at [facility] and taken home.  VSS. Port heparin locked.  Patient wheeled down by staff member. Oswald Hillock, RN

## 2015-08-17 NOTE — Plan of Care (Signed)
Report received. Assume care of pt. Lacinda Axon, SN [facility]

## 2015-08-17 NOTE — Plan of Care (Signed)
Change of shift report given. Lacinda Axon, SN [facility]

## 2015-08-17 NOTE — Progress Notes (Signed)
SATURATION QUALIFICATIONS: (This note is used to comply with regulatory documentation for home oxygen)  Patient Saturations on Room Air at Rest = 95%  Patient Saturations on Room Air while Ambulating = >/=91%   

## 2015-08-17 NOTE — Progress Notes (Signed)
[name]  MRN: [id number]  DOB/AGE: June 11, 1956 60 y.o.  Primary Care Physician:HAWKINS,EDWARD L, MD  Admit date: 08/09/2015  Chief Complaint: No chief complaint on file.   S-Pt presented on  08/09/2015 with No chief complaint on file. .    Pt today feels better  Meds . amitriptyline  50 mg Oral QHS  . aspirin EC  81 mg Oral Daily  . benzonatate  100 mg Oral TID  . budesonide-formoterol  2 puff Inhalation Q12H  . buPROPion  150 mg Oral Daily  . clopidogrel  75 mg Oral Daily  . cycloSPORINE  1 drop Both Eyes BID  . enoxaparin (LOVENOX) injection  30 mg Subcutaneous Q24H  . feeding supplement (ENSURE ENLIVE)  237 mL Oral BID BM  . guaiFENesin  1,200 mg Oral BID  . insulin aspart  0-15 Units Subcutaneous TID WC  . insulin aspart  0-5 Units Subcutaneous QHS  . ipratropium-albuterol  3 mL Nebulization TID  . isosorbide mononitrate  15 mg Oral Daily  . levofloxacin  750 mg Oral Q48H  . magic mouthwash  5 mL Oral QID  . nicotine  21 mg Transdermal Daily  . oxyCODONE  80 mg Oral Q6H  . pantoprazole  80 mg Oral Q1200  . predniSONE  50 mg Oral Q breakfast  . rosuvastatin  40 mg Oral Daily  . senna  1 tablet Oral QHS  . vitamin B-12  100 mcg Oral Daily        Physical Exam: Vital signs in last 24 hours: Temp:  [97.9 F (36.6 C)-98.4 F (36.9 C)] 98.4 F (36.9 C) (02/15 0437) Pulse Rate:  [79-85] 84 (02/15 0437) Resp:  [20] 20 (02/15 0437) BP: (108-136)/(46-64) 136/64 mmHg (02/15 0437) SpO2:  [94 %-97 %] 97 % (02/15 0715) Weight:  [125 lb 14.8 oz (57.12 kg)] 125 lb 14.8 oz (57.12 kg) (02/15 0437) Weight change: -4.4 oz (-0.124 kg) Last BM Date: 08/16/15  Intake/Output from previous day: 02/14 0701 - 02/15 0700 In: 960 [P.O.:960] Out: 1500 [Urine:1500] Total I/O In: 240 [P.O.:240] Out: -    Physical Exam: General- pt is awake,alert, oriented to time place and person Resp- No acute REsp distress,  Rhonchi+ CVS- S1S2 regular in rate and rhythm GIT- BS+,  soft, NT, ND EXT- NO LE Edema, NO Cyanosis   Lab Results: CBC  Recent Labs  08/16/15 0557  WBC 8.2  HGB 11.2*  HCT 33.3*  PLT 146*    BMET  Recent Labs  08/16/15 0557 08/17/15 0559  NA 139 141  K 4.6 4.0  CL 98* 97*  CO2 31 34*  GLUCOSE 200* 109*  BUN 50* 47*  CREATININE 1.73* 1.56*  CALCIUM 7.9* 8.2*    Creat Trend 2014 4.42=>4.16=>2.0=>1.76=>1.56 2016 1.3--1.45 2015 1.2--1.5 2013 1.2   MICRO Recent Results (from the past 240 hour(s))  MRSA PCR Screening     Status: None   Collection Time: 08/09/15 11:45 AM  Result Value Ref Range Status   MRSA by PCR NEGATIVE NEGATIVE Final    Comment:        The GeneXpert MRSA Assay (FDA approved for NASAL specimens only), is one component of a comprehensive MRSA colonization surveillance program. It is not intended to diagnose MRSA infection nor to guide or monitor treatment for MRSA infections.       Lab Results  Component Value Date   CALCIUM 8.2* 08/17/2015               Impression: 1)Renal  AKI secondary to Prerenal/ATN  AKI secondary to sepsis/Hypotension/Bactrim  AKi now better                Creat near to baseline  AKI on CKD  CKD stage .  CKD since  CKD secondary to  Progression of CKD   Proteinura will check. Present/Absent .  M i/a croalbuminuria Present/Absent.  Hematuria .  Nephrolithiasis Hx Present/Absent   2)CVs- hemodynamically stable   3)Anemia HGb stable  4)Oncology- hx of recurrent lymphocytic lymphoma  Being followed by oncology  5)Id- admitted with HCAP On Levofloxacin Primary MD following  6)Electrolytes  Hypokalemic  Better  Hyponatremic  better  7)Acid base Co2 at goal    Plan:  Will continue current care.      Dominion Kathan S 08/17/2015, 9:00 AM

## 2015-08-18 ENCOUNTER — Inpatient Hospital Stay (HOSPITAL_COMMUNITY)
Admission: EM | Admit: 2015-08-18 | Discharge: 2015-08-24 | DRG: 070 | Disposition: A | Discharge status: Home Health | Payer: Medicare HMO | Attending: Pulmonary Disease | Admitting: Pulmonary Disease

## 2015-08-18 ENCOUNTER — Encounter (HOSPITAL_COMMUNITY): Payer: Self-pay

## 2015-08-18 ENCOUNTER — Emergency Department (HOSPITAL_COMMUNITY): Payer: Medicare HMO

## 2015-08-18 DIAGNOSIS — J45909 Unspecified asthma, uncomplicated: Secondary | ICD-10-CM | POA: Diagnosis present

## 2015-08-18 DIAGNOSIS — I509 Heart failure, unspecified: Secondary | ICD-10-CM | POA: Diagnosis present

## 2015-08-18 DIAGNOSIS — K219 Gastro-esophageal reflux disease without esophagitis: Secondary | ICD-10-CM | POA: Diagnosis present

## 2015-08-18 DIAGNOSIS — Z6820 Body mass index (BMI) 20.0-20.9, adult: Secondary | ICD-10-CM | POA: Diagnosis not present

## 2015-08-18 DIAGNOSIS — Z833 Family history of diabetes mellitus: Secondary | ICD-10-CM

## 2015-08-18 DIAGNOSIS — G9341 Metabolic encephalopathy: Principal | ICD-10-CM | POA: Diagnosis present

## 2015-08-18 DIAGNOSIS — F17210 Nicotine dependence, cigarettes, uncomplicated: Secondary | ICD-10-CM | POA: Diagnosis present

## 2015-08-18 DIAGNOSIS — I130 Hypertensive heart and chronic kidney disease with heart failure and stage 1 through stage 4 chronic kidney disease, or unspecified chronic kidney disease: Secondary | ICD-10-CM | POA: Diagnosis present

## 2015-08-18 DIAGNOSIS — Z801 Family history of malignant neoplasm of trachea, bronchus and lung: Secondary | ICD-10-CM | POA: Diagnosis not present

## 2015-08-18 DIAGNOSIS — Z79891 Long term (current) use of opiate analgesic: Secondary | ICD-10-CM

## 2015-08-18 DIAGNOSIS — Z8544 Personal history of malignant neoplasm of other female genital organs: Secondary | ICD-10-CM

## 2015-08-18 DIAGNOSIS — E440 Moderate protein-calorie malnutrition: Secondary | ICD-10-CM | POA: Diagnosis present

## 2015-08-18 DIAGNOSIS — Z8673 Personal history of transient ischemic attack (TIA), and cerebral infarction without residual deficits: Secondary | ICD-10-CM

## 2015-08-18 DIAGNOSIS — M810 Age-related osteoporosis without current pathological fracture: Secondary | ICD-10-CM | POA: Diagnosis present

## 2015-08-18 DIAGNOSIS — G9340 Encephalopathy, unspecified: Secondary | ICD-10-CM | POA: Diagnosis not present

## 2015-08-18 DIAGNOSIS — Z7902 Long term (current) use of antithrombotics/antiplatelets: Secondary | ICD-10-CM

## 2015-08-18 DIAGNOSIS — G894 Chronic pain syndrome: Secondary | ICD-10-CM | POA: Diagnosis present

## 2015-08-18 DIAGNOSIS — I739 Peripheral vascular disease, unspecified: Secondary | ICD-10-CM | POA: Diagnosis present

## 2015-08-18 DIAGNOSIS — I10 Essential (primary) hypertension: Secondary | ICD-10-CM | POA: Diagnosis present

## 2015-08-18 DIAGNOSIS — I252 Old myocardial infarction: Secondary | ICD-10-CM

## 2015-08-18 DIAGNOSIS — C8590 Non-Hodgkin lymphoma, unspecified, unspecified site: Secondary | ICD-10-CM | POA: Diagnosis present

## 2015-08-18 DIAGNOSIS — J189 Pneumonia, unspecified organism: Secondary | ICD-10-CM | POA: Diagnosis not present

## 2015-08-18 DIAGNOSIS — Z7982 Long term (current) use of aspirin: Secondary | ICD-10-CM | POA: Diagnosis not present

## 2015-08-18 DIAGNOSIS — Y95 Nosocomial condition: Secondary | ICD-10-CM | POA: Diagnosis present

## 2015-08-18 DIAGNOSIS — G629 Polyneuropathy, unspecified: Secondary | ICD-10-CM | POA: Diagnosis present

## 2015-08-18 DIAGNOSIS — I2510 Atherosclerotic heart disease of native coronary artery without angina pectoris: Secondary | ICD-10-CM | POA: Diagnosis present

## 2015-08-18 DIAGNOSIS — Z7951 Long term (current) use of inhaled steroids: Secondary | ICD-10-CM | POA: Diagnosis not present

## 2015-08-18 DIAGNOSIS — Z825 Family history of asthma and other chronic lower respiratory diseases: Secondary | ICD-10-CM

## 2015-08-18 DIAGNOSIS — Z87442 Personal history of urinary calculi: Secondary | ICD-10-CM

## 2015-08-18 DIAGNOSIS — J449 Chronic obstructive pulmonary disease, unspecified: Secondary | ICD-10-CM | POA: Diagnosis present

## 2015-08-18 DIAGNOSIS — J9601 Acute respiratory failure with hypoxia: Secondary | ICD-10-CM | POA: Diagnosis not present

## 2015-08-18 DIAGNOSIS — J440 Chronic obstructive pulmonary disease with acute lower respiratory infection: Secondary | ICD-10-CM | POA: Diagnosis present

## 2015-08-18 DIAGNOSIS — Z79899 Other long term (current) drug therapy: Secondary | ICD-10-CM

## 2015-08-18 DIAGNOSIS — Z955 Presence of coronary angioplasty implant and graft: Secondary | ICD-10-CM | POA: Diagnosis not present

## 2015-08-18 DIAGNOSIS — R410 Disorientation, unspecified: Secondary | ICD-10-CM | POA: Diagnosis present

## 2015-08-18 DIAGNOSIS — E785 Hyperlipidemia, unspecified: Secondary | ICD-10-CM | POA: Diagnosis present

## 2015-08-18 DIAGNOSIS — Z8051 Family history of malignant neoplasm of kidney: Secondary | ICD-10-CM

## 2015-08-18 DIAGNOSIS — C519 Malignant neoplasm of vulva, unspecified: Secondary | ICD-10-CM | POA: Diagnosis present

## 2015-08-18 DIAGNOSIS — Z8249 Family history of ischemic heart disease and other diseases of the circulatory system: Secondary | ICD-10-CM | POA: Diagnosis not present

## 2015-08-18 LAB — BLOOD GAS, ARTERIAL
Acid-Base Excess: 6.5 mmol/L — ABNORMAL HIGH (ref 0.0–2.0)
Bicarbonate: 29.7 mEq/L — ABNORMAL HIGH (ref 20.0–24.0)
Drawn by: 277331
O2 Content: 2 L/min
O2 Saturation: 88.5 %
pCO2 arterial: 47.7 mmHg — ABNORMAL HIGH (ref 35.0–45.0)
pH, Arterial: 7.427 (ref 7.350–7.450)
pO2, Arterial: 59.2 mmHg — ABNORMAL LOW (ref 80.0–100.0)

## 2015-08-18 LAB — URINE MICROSCOPIC-ADD ON

## 2015-08-18 LAB — URINALYSIS, ROUTINE W REFLEX MICROSCOPIC
Bilirubin Urine: NEGATIVE
Glucose, UA: NEGATIVE mg/dL
Ketones, ur: NEGATIVE mg/dL
Leukocytes, UA: NEGATIVE
Nitrite: NEGATIVE
Protein, ur: NEGATIVE mg/dL
Specific Gravity, Urine: 1.01 (ref 1.005–1.030)
pH: 6 (ref 5.0–8.0)

## 2015-08-18 LAB — LACTIC ACID, PLASMA
Lactic Acid, Venous: 0.8 mmol/L (ref 0.5–2.0)
Lactic Acid, Venous: 0.5 mmol/L (ref 0.5–2.0)

## 2015-08-18 LAB — RAPID URINE DRUG SCREEN, HOSP PERFORMED
Amphetamines: NOT DETECTED
Barbiturates: NOT DETECTED
Benzodiazepines: POSITIVE — AB
Cocaine: NOT DETECTED
Opiates: POSITIVE — AB
Tetrahydrocannabinol: NOT DETECTED

## 2015-08-18 LAB — CBC WITH DIFFERENTIAL/PLATELET
Basophils Absolute: 0 10*3/uL (ref 0.0–0.1)
Basophils Relative: 0 %
Eosinophils Absolute: 0.1 10*3/uL (ref 0.0–0.7)
Eosinophils Relative: 1 %
HCT: 34.7 % — ABNORMAL LOW (ref 36.0–46.0)
Hemoglobin: 11.7 g/dL — ABNORMAL LOW (ref 12.0–15.0)
Lymphocytes Relative: 6 %
Lymphs Abs: 0.4 10*3/uL — ABNORMAL LOW (ref 0.7–4.0)
MCH: 30.3 pg (ref 26.0–34.0)
MCHC: 33.7 g/dL (ref 30.0–36.0)
MCV: 89.9 fL (ref 78.0–100.0)
Monocytes Absolute: 0.5 10*3/uL (ref 0.1–1.0)
Monocytes Relative: 7 %
Neutro Abs: 6.3 10*3/uL (ref 1.7–7.7)
Neutrophils Relative %: 86 %
Platelets: 137 10*3/uL — ABNORMAL LOW (ref 150–400)
RBC: 3.86 MIL/uL — ABNORMAL LOW (ref 3.87–5.11)
RDW: 16 % — ABNORMAL HIGH (ref 11.5–15.5)
WBC: 7.3 10*3/uL (ref 4.0–10.5)

## 2015-08-18 LAB — BASIC METABOLIC PANEL
Anion gap: 10 (ref 5–15)
BUN: 35 mg/dL — ABNORMAL HIGH (ref 6–20)
CO2: 33 mmol/L — ABNORMAL HIGH (ref 22–32)
Calcium: 8 mg/dL — ABNORMAL LOW (ref 8.9–10.3)
Chloride: 95 mmol/L — ABNORMAL LOW (ref 101–111)
Creatinine, Ser: 1.35 mg/dL — ABNORMAL HIGH (ref 0.44–1.00)
GFR calc Af Amer: 49 mL/min — ABNORMAL LOW (ref >60)
GFR calc non Af Amer: 43 mL/min — ABNORMAL LOW (ref >60)
Glucose, Bld: 100 mg/dL — ABNORMAL HIGH (ref 65–99)
Potassium: 3.4 mmol/L — ABNORMAL LOW (ref 3.5–5.1)
Sodium: 138 mmol/L (ref 135–145)

## 2015-08-18 LAB — CBG MONITORING, ED: Glucose-Capillary: 100 mg/dL — ABNORMAL HIGH (ref 65–99)

## 2015-08-18 LAB — TROPONIN I: Troponin I: 0.03 ng/mL (ref <0.031)

## 2015-08-18 MED ORDER — CYCLOSPORINE 0.05 % OP EMUL
1.0000 [drp] | Freq: Two times a day (BID) | OPHTHALMIC | Status: DC
  Administered 2015-08-19 – 2015-08-23 (×9): 1 [drp] via OPHTHALMIC
  Filled 2015-08-18 (×16): qty 1

## 2015-08-18 MED ORDER — SODIUM CHLORIDE 0.9 % IV BOLUS (SEPSIS)
1000.0000 mL | Freq: Once | INTRAVENOUS | Status: AC
  Administered 2015-08-18: 1000 mL via INTRAVENOUS

## 2015-08-18 MED ORDER — VANCOMYCIN HCL 500 MG IV SOLR
500.0000 mg | Freq: Two times a day (BID) | INTRAVENOUS | Status: DC
  Filled 2015-08-18 (×2): qty 500

## 2015-08-18 MED ORDER — ROSUVASTATIN CALCIUM 20 MG PO TABS
40.0000 mg | ORAL_TABLET | Freq: Every day | ORAL | Status: DC
  Filled 2015-08-18: qty 2

## 2015-08-18 MED ORDER — SODIUM CHLORIDE 0.9 % IV SOLN: 250.0000 mL | INTRAVENOUS | Status: DC | PRN

## 2015-08-18 MED ORDER — FUROSEMIDE 40 MG PO TABS: 40.0000 mg | ORAL_TABLET | Freq: Every day | ORAL | Status: DC | PRN

## 2015-08-18 MED ORDER — DIAZEPAM 5 MG PO TABS: 5.0000 mg | ORAL_TABLET | Freq: Four times a day (QID) | ORAL | Status: DC | PRN

## 2015-08-18 MED ORDER — ASPIRIN EC 81 MG PO TBEC
81.0000 mg | DELAYED_RELEASE_TABLET | Freq: Every day | ORAL | Status: DC
  Filled 2015-08-18: qty 1

## 2015-08-18 MED ORDER — BUDESONIDE-FORMOTEROL FUMARATE 160-4.5 MCG/ACT IN AERO
2.0000 | INHALATION_SPRAY | Freq: Two times a day (BID) | RESPIRATORY_TRACT | Status: DC
  Administered 2015-08-19 – 2015-08-21 (×6): 2 via RESPIRATORY_TRACT
  Filled 2015-08-18: qty 6

## 2015-08-18 MED ORDER — ALUM & MAG HYDROXIDE-SIMETH 200-200-20 MG/5ML PO SUSP
30.0000 mL | Freq: Four times a day (QID) | ORAL | Status: DC | PRN
  Filled 2015-08-18: qty 30

## 2015-08-18 MED ORDER — SODIUM CHLORIDE 0.9% FLUSH
3.0000 mL | Freq: Two times a day (BID) | INTRAVENOUS | Status: DC
  Administered 2015-08-19 – 2015-08-23 (×3): 3 mL via INTRAVENOUS

## 2015-08-18 MED ORDER — BIOTIN 1 MG PO CAPS
1.0000 | ORAL_CAPSULE | Freq: Every day | ORAL | Status: DC
  Filled 2015-08-18: qty 1

## 2015-08-18 MED ORDER — ONDANSETRON HCL 4 MG/2ML IJ SOLN: 4.0000 mg | Freq: Four times a day (QID) | INTRAMUSCULAR | Status: DC | PRN

## 2015-08-18 MED ORDER — PIPERACILLIN-TAZOBACTAM 3.375 G IVPB 30 MIN
3.3750 g | Freq: Once | INTRAVENOUS | Status: AC
  Administered 2015-08-18: 3.375 g via INTRAVENOUS
  Filled 2015-08-18: qty 50

## 2015-08-18 MED ORDER — VITAMIN B-12 100 MCG PO TABS
100.0000 ug | ORAL_TABLET | Freq: Every day | ORAL | Status: DC
  Filled 2015-08-18 (×4): qty 1

## 2015-08-18 MED ORDER — ALPRAZOLAM 1 MG PO TABS
1.0000 mg | ORAL_TABLET | Freq: Three times a day (TID) | ORAL | Status: DC | PRN
  Administered 2015-08-18: 1 mg via ORAL
  Filled 2015-08-18: qty 1

## 2015-08-18 MED ORDER — BUPROPION HCL ER (XL) 150 MG PO TB24
150.0000 mg | ORAL_TABLET | Freq: Every day | ORAL | Status: DC
  Filled 2015-08-18 (×4): qty 1

## 2015-08-18 MED ORDER — IPRATROPIUM-ALBUTEROL 0.5-2.5 (3) MG/3ML IN SOLN
3.0000 mL | Freq: Once | RESPIRATORY_TRACT | Status: AC
  Administered 2015-08-18: 3 mL via RESPIRATORY_TRACT
  Filled 2015-08-18: qty 3

## 2015-08-18 MED ORDER — SODIUM CHLORIDE 0.9% FLUSH
3.0000 mL | Freq: Two times a day (BID) | INTRAVENOUS | Status: DC
  Administered 2015-08-19 – 2015-08-23 (×8): 3 mL via INTRAVENOUS

## 2015-08-18 MED ORDER — AMITRIPTYLINE HCL 25 MG PO TABS
50.0000 mg | ORAL_TABLET | Freq: Every day | ORAL | Status: DC
  Administered 2015-08-18: 50 mg via ORAL
  Filled 2015-08-18: qty 2

## 2015-08-18 MED ORDER — ALBUTEROL SULFATE (2.5 MG/3ML) 0.083% IN NEBU: 2.5000 mg | INHALATION_SOLUTION | RESPIRATORY_TRACT | Status: DC | PRN

## 2015-08-18 MED ORDER — ONDANSETRON HCL 4 MG PO TABS: 4.0000 mg | ORAL_TABLET | Freq: Four times a day (QID) | ORAL | Status: DC | PRN

## 2015-08-18 MED ORDER — HYDROCORTISONE NA SUCCINATE PF 100 MG IJ SOLR
100.0000 mg | Freq: Once | INTRAMUSCULAR | Status: AC
Start: 2015-08-18 — End: 2015-08-18
  Administered 2015-08-18: 100 mg via INTRAVENOUS
  Filled 2015-08-18: qty 2

## 2015-08-18 MED ORDER — PREDNISONE 20 MG PO TABS
50.0000 mg | ORAL_TABLET | Freq: Every day | ORAL | Status: DC
  Filled 2015-08-18 (×2): qty 2

## 2015-08-18 MED ORDER — SULFADIAZINE 500 MG PO TABS
500.0000 mg | ORAL_TABLET | ORAL | Status: DC
  Filled 2015-08-18 (×4): qty 1

## 2015-08-18 MED ORDER — ASPIRIN 81 MG PO CHEW
324.0000 mg | CHEWABLE_TABLET | Freq: Once | ORAL | Status: AC
  Administered 2015-08-18: 324 mg via ORAL
  Filled 2015-08-18: qty 4

## 2015-08-18 MED ORDER — ALBUTEROL SULFATE (2.5 MG/3ML) 0.083% IN NEBU: 3.0000 mL | INHALATION_SOLUTION | Freq: Four times a day (QID) | RESPIRATORY_TRACT | Status: DC | PRN

## 2015-08-18 MED ORDER — VANCOMYCIN HCL IN DEXTROSE 1-5 GM/200ML-% IV SOLN
1000.0000 mg | Freq: Once | INTRAVENOUS | Status: AC
  Administered 2015-08-18: 1000 mg via INTRAVENOUS
  Filled 2015-08-18: qty 200

## 2015-08-18 MED ORDER — SODIUM CHLORIDE 0.9% FLUSH: 3.0000 mL | INTRAVENOUS | Status: DC | PRN

## 2015-08-18 MED ORDER — ISOSORBIDE MONONITRATE ER 30 MG PO TB24
15.0000 mg | ORAL_TABLET | Freq: Every day | ORAL | Status: DC
  Administered 2015-08-22 – 2015-08-24 (×3): 15 mg via ORAL
  Filled 2015-08-18 (×4): qty 1

## 2015-08-18 MED ORDER — MUPIROCIN 2 % EX OINT: 1.0000 "application " | TOPICAL_OINTMENT | Freq: Every day | CUTANEOUS | Status: DC | PRN

## 2015-08-18 NOTE — ED Notes (Signed)
Pt discharged from hospital yesterday with dx HCAP, COPD exacerbation. EMS called by Home health nurse. Upon arrival O2 sat 85% on room air. Started on non rebreather and sat 95%. Pt reports smoking cigarette and then SOB started

## 2015-08-18 NOTE — ED Notes (Signed)
Pt reports SOB improved. Changed non rebreather to 02 cannula at 2L/min. O2 sat 97 %

## 2015-08-18 NOTE — ED Notes (Signed)
Patient is resting comfortably. 

## 2015-08-18 NOTE — ED Notes (Signed)
Previous noted documented on wrong pt in error

## 2015-08-18 NOTE — ED Provider Notes (Signed)
CSN: [id number]     Arrival date & time 08/18/15  1424 History   First MD Initiated Contact with Patient 08/18/15 1503     Chief Complaint  Patient presents with  . Shortness of Breath     (Consider location/radiation/quality/duration/timing/severity/associated sxs/prior Treatment) HPI   59yF with hx of recurrent lymphocytic lymphoma-on chemotherapy, emphysema/COPD, Langerhans cell histiocytosis of lung, 2009, PVD, CAD, and hypertension. Brought in for evaluation after family noted that confused earlier today. Just discharged yesterday from hospital with acute on chronic respiratory failure, pneumonia, AKI. Cardiology notes going back over a decade. Chronic occulusion of RCA with L->R collaterals. S/p b/l iliac stents in 2013. Currently she is confused and unreliable historian. When asked what brought her to the ED she replied "It's a little smoky isn't it? It's not smoky in here though." Disoriented to time. Denies dyspnea although noted to be hypoxic. Her EKG is changed from priors. New anterolateral STE with ST depression inferiorly. She denies chest pain.    Past Medical History  Diagnosis Date  . Coronary artery disease     status post stenting of the aortic and iliac vessels by Dr. Gwenlyn Found. Repeat aortic stenting 2010  . Left ventricular dysfunction     hx of with ejection fraction of 35% range.  . Carotid artery disease (HCC)     hx of carotid cerebrovascular disease with 0-39% bilateral internal carotid artery setenosis   . Non Hodgkin's lymphoma ([place])   . Chronic back pain     status post lumbar surgery  . Hyperlipidemia   . Hypertension   . COPD (chronic obstructive pulmonary disease) ([place])   . Shortness of breath   . Cancer (HCC)     hx of non hogdin lymphoma  . CHF (congestive heart failure) ([place])   . Blood transfusion   . Arthritis   . Myocardial infarction ([place])   . Peripheral vascular disease ([place])   . GERD (gastroesophageal reflux disease)   . Anemia   .  Osteoporosis   . Depression   . Anxiety   . Other malignant lymphomas, unspecified site, extranodal and solid organ sites 2004    Back  . Asthma   . Vulvar cancer ([place])   . Anginal pain ([place])     occ last time last wk  . Dysrhythmia     ?  . Stroke ([place]) 04  . Pneumonia     hx  . History of kidney stones   . Seizures ([place])     ? yrs ago  . Lymphocytic lymphoma ([place]) 12/24/2013  . Non-Hodgkin lymphoma, B-cell, low grade 12/24/2013  . Atherosclerosis of native arteries of the extremities with rest pain 12/10/2008    Qualifier: Diagnosis of  By: Owens Shark, RN, BSN, Lauren    . Langerhans cell histiocytosis of lung, 2009 11/10/2013  . PVD 10/27/2008    Qualifier: Diagnosis of  By: Owens Shark, RN, BSN, Lauren    . COPD 10/27/2008    Qualifier: Diagnosis of  By: Owens Shark RN, BSN, Lauren     Past Surgical History  Procedure Laterality Date  . [place] surg    . Back surgery      x 2  . Lung biopsy    . Cardiac catheterization    . Abdominal aortogram  08/08/2011  . Abdominal hysterectomy      partial  . Portacath placement      pt says its for chemo only and its an old port(04)   . Dilation and curettage of  uterus    . Vulva /perineum biopsy  04/09/2012    Procedure: VULVAR BIOPSY;  Surgeon: Florian Buff, MD;  Location: [facility];  Service: Gynecology;  Laterality: N/A;  . Vulvectomy  05/20/2012    Procedure: VULVECTOMY;  Surgeon: Alvino Chapel, MD;  Location: [facility];  Service: Gynecology;  Laterality: Bilateral;  BILATERAL SIMPLE VULVECTOMIES  . Video assisted thoracoscopy (vats)/wedge resection Right 12/02/2013    Procedure: VIDEO ASSISTED THORACOSCOPY (VATS)/WEDGE RESECTION;  Surgeon: Melrose Nakayama, MD;  Location: [facility];  Service: Thoracic;  Laterality: Right;  . Abdominal aortagram N/A 08/08/2011    Procedure: ABDOMINAL Maxcine Ham;  Surgeon: Sherren Mocha, MD;  Location: [facility] CATH LAB;  Service: Cardiovascular;  Laterality: N/A;  . Percutaneous stent intervention Bilateral 08/08/2011     Procedure: PERCUTANEOUS STENT INTERVENTION;  Surgeon: Sherren Mocha, MD;  Location: [facility] CATH LAB;  Service: Cardiovascular;  Laterality: Bilateral;   Family History  Problem Relation Age of Onset  . Coronary artery disease      positive for vascular and cardiac disease  . Heart disease Mother   . Lung cancer Mother   . Heart attack Mother   . Heart failure Mother   . Heart disease Father   . Kidney cancer Father   . Heart attack Father   . Heart failure Father   . Diabetes Sister   . Aortic aneurysm Sister   . Heart attack Brother   . Asthma Son   . Sleep apnea Son   . Endometriosis Daughter    Social History  Substance Use Topics  . Smoking status: Current Every Day Smoker -- 2.50 packs/day for 40 years    Types: Cigarettes  . Smokeless tobacco: Never Used  . Alcohol Use: No   OB History    No data available     Review of Systems  Level 5 caveat because of encephalopathy.   Allergies  Morphine and related and Naproxen  Home Medications   Prior to Admission medications   Medication Sig Start Date End Date Taking? Authorizing Provider  albuterol (PROVENTIL) (2.5 MG/3ML) 0.083% nebulizer solution Take 3 mLs (2.5 mg total) by nebulization every 4 (four) hours as needed for wheezing or shortness of breath. 08/17/15  Yes Lucia Gaskins, MD  albuterol (VENTOLIN HFA) 108 (90 BASE) MCG/ACT inhaler Inhale 2 puffs into the lungs every 6 (six) hours as needed for wheezing or shortness of breath (wheezing and shortness of breath). Reported on 08/09/2015   Yes Historical Provider, MD  ALPRAZolam Duanne Moron) 1 MG tablet Take 1 mg by mouth 3 (three) times daily as needed for anxiety or sleep (sleep).    Yes Historical Provider, MD  amitriptyline (ELAVIL) 50 MG tablet Take 50 mg by mouth at bedtime.   Yes Historical Provider, MD  aspirin 81 MG EC tablet Take 81 mg by mouth daily.     Yes Historical Provider, MD  Biotin 1 MG CAPS Take 1 tablet by mouth daily. Reported on 08/09/2015   Yes  Historical Provider, MD  BREO ELLIPTA 100-25 MCG/INH AEPB Take 1 Inhaler by mouth 2 (two) times daily. 07/06/15  Yes Historical Provider, MD  buPROPion (WELLBUTRIN XL) 150 MG 24 hr tablet Take 150 mg by mouth daily.   Yes Historical Provider, MD  clopidogrel (PLAVIX) 75 MG tablet TAKE 1 TABLET DAILY WITH BREAKFAST. 12/21/14  Yes Sherren Mocha, MD  cycloSPORINE (RESTASIS) 0.05 % ophthalmic emulsion Place 1 drop into both eyes 2 (two) times daily.   Yes Historical Provider, MD  diazepam (VALIUM) 5 MG tablet Take 5 mg by mouth every 6 (six) hours as needed for muscle spasms (muscle spasms). For muscle spasms   Yes Historical Provider, MD  furosemide (LASIX) 40 MG tablet Take 40 mg by mouth daily as needed for fluid (fluid).  05/19/13  Yes Historical Provider, MD  isosorbide mononitrate (IMDUR) 30 MG 24 hr tablet TAKE 1/2 TABLET BY MOUTH DAILY. 12/21/14  Yes Sherren Mocha, MD  metoprolol (LOPRESSOR) 50 MG tablet TAKE (1/2) TABLET BY MOUTH TWICE DAILY. 07/06/15  Yes Sherren Mocha, MD  mupirocin ointment (BACTROBAN) 2 % Apply 1 application topically daily as needed (leg infection). Reported on 08/09/2015 03/13/12  Yes Historical Provider, MD  NEXIUM 40 MG capsule Take 40 mg by mouth daily.  12/29/11  Yes Historical Provider, MD  oxycodone (ROXICODONE) 30 MG immediate release tablet Take 30 mg by mouth every 4 (four) hours as needed for pain (pain). For breakthrough pain   Yes Historical Provider, MD  OXYCONTIN 80 MG 12 hr tablet Take 80 mg by mouth 4 (four) times daily as needed. 07/27/15  Yes Historical Provider, MD  predniSONE (DELTASONE) 50 MG tablet Take 1 tablet (50 mg total) by mouth daily with breakfast. 08/17/15  Yes Lucia Gaskins, MD  rosuvastatin (CRESTOR) 40 MG tablet Take 40 mg by mouth daily.     Yes Historical Provider, MD  sulfaDIAZINE 500 MG tablet Take 1 tablet by mouth 2 (two) times daily. Only on weekends   Yes Historical Provider, MD  sulfamethoxazole-trimethoprim (BACTRIM DS) 800-160 MG per  tablet Take 1 tablet by mouth 2 (two) times a week. Reported on 08/09/2015   Yes Historical Provider, MD  SYMBICORT 160-4.5 MCG/ACT inhaler Inhale 2 puffs into the lungs every 12 (twelve) hours.  06/01/14  Yes Historical Provider, MD  vitamin B-12 (CYANOCOBALAMIN) 100 MCG tablet Take 100 mcg by mouth daily.   Yes Historical Provider, MD  NITROSTAT 0.4 MG SL tablet DISSOLVE 1 TABLET UNDER TONGUE EVERY 5 MINUTES UP TO 15 MINUTES FOR CHEST PAIN. IF NO RELIEF CALL 911. 08/27/14   Sherren Mocha, MD   BP 82/65 mmHg  Pulse 91  Temp(Src) 98.6 F (37 C) (Oral)  Resp 21  Ht 5\' 5"  (1.651 m)  Wt 125 lb (56.7 kg)  BMI 20.80 kg/m2  SpO2 92% Physical Exam  Constitutional: She appears well-developed and well-nourished. No distress.  Laying in bed. Tired appearing, but nontoxic.  HENT:  Head: Normocephalic and atraumatic.  Eyes: Conjunctivae are normal. Right eye exhibits no discharge. Left eye exhibits no discharge.  Neck: Neck supple.  Cardiovascular: Normal rate, regular rhythm and normal heart sounds.  Exam reveals no gallop and no friction rub.   No murmur heard. Pulmonary/Chest: Effort normal and breath sounds normal. No respiratory distress.  Abdominal: Soft. She exhibits no distension. There is no tenderness.  Musculoskeletal: She exhibits no edema or tenderness.  Neurological: No cranial nerve deficit. She exhibits normal muscle tone. Coordination normal.  Somewhat drowsy. Answers questions but responses are often inappropriate/nonsensical. Follows simple commands. Motor deficit.  Skin: Skin is warm and dry.  Psychiatric: She has a normal mood and affect. Her behavior is normal. Thought content normal.  Nursing note and vitals reviewed.   ED Course  Procedures (including critical care time) Labs Review Labs Reviewed  CBC WITH DIFFERENTIAL/PLATELET - Abnormal; Notable for the following:    RBC 3.86 (*)    Hemoglobin 11.7 (*)    HCT 34.7 (*)    RDW 16.0 (*)  Platelets 137 (*)     Lymphs Abs 0.4 (*)    All other components within normal limits  BASIC METABOLIC PANEL - Abnormal; Notable for the following:    Potassium 3.4 (*)    Chloride 95 (*)    CO2 33 (*)    Glucose, Bld 100 (*)    BUN 35 (*)    Creatinine, Ser 1.35 (*)    Calcium 8.0 (*)    GFR calc non Af Amer 43 (*)    GFR calc Af Amer 49 (*)    All other components within normal limits  URINALYSIS, ROUTINE W REFLEX MICROSCOPIC (NOT AT [facility]) - Abnormal; Notable for the following:    Hgb urine dipstick TRACE (*)    All other components within normal limits  URINE RAPID DRUG SCREEN, HOSP PERFORMED - Abnormal; Notable for the following:    Opiates POSITIVE (*)    Benzodiazepines POSITIVE (*)    All other components within normal limits  BLOOD GAS, ARTERIAL - Abnormal; Notable for the following:    pCO2 arterial 47.7 (*)    pO2, Arterial 59.2 (*)    Bicarbonate 29.7 (*)    Acid-Base Excess 6.5 (*)    All other components within normal limits  URINE MICROSCOPIC-ADD ON - Abnormal; Notable for the following:    Squamous Epithelial / LPF 0-5 (*)    Bacteria, UA RARE (*)    All other components within normal limits  CBG MONITORING, ED - Abnormal; Notable for the following:    Glucose-Capillary 100 (*)    All other components within normal limits  CULTURE, BLOOD (ROUTINE X 2)  CULTURE, BLOOD (ROUTINE X 2)  TROPONIN I  LACTIC ACID, PLASMA  LACTIC ACID, PLASMA    Imaging Review Dg Chest Portable 1 View  08/18/2015  CLINICAL DATA:  Hypoxia.  Shortness of breath. EXAM: PORTABLE CHEST - 1 VIEW COMPARISON:  Two-view chest x-ray 08/09/2015. FINDINGS: Chronic diffuse interstitial coarsening is again noted bilaterally. Previously-seen superimposed airspace disease has cleared. The heart size is normal. A left subclavian Port-A-Cath is stable. IMPRESSION: 1. Interval clearing of right-sided airspace disease. 2. Chronic interstitial coarsening is stable. 3. No acute cardiopulmonary disease. Electronically Signed    By: San Morelle M.D.   On: 08/18/2015 15:51   I have personally reviewed and evaluated these images and lab results as part of my medical decision-making.   EKG Interpretation   Date/Time:  Thursday August 18 2015 15:01:09 EST Ventricular Rate:  88 PR Interval:  114 QRS Duration: 101 QT Interval:  388 QTC Calculation: 469 R Axis:   95 Text Interpretation:  Sinus rhythm short PR interval right axis deviation  Borderline ST depression, inferior leads Borderline ST elevation, lateral  leads Confirmed by Finnlee Silvernail  MD, Burnis Halling ([id number]) on 08/18/2015 4:34:58 PM      MDM   Final diagnoses:  Acute respiratory failure with hypoxia (HCC)  Acute encephalopathy    59yF with confusion. Discharged yesterday with pneumonia, AKI. Some mild hypotension, but appears to run on lower side. RR>20 and HR >90. Empiric abx ordered. IVF. Check rectal temp. New EKG changes. Known CAD. Denies CP, but unreliable historian. ASA given. New o2 requirement. Supplemental o2 to keep sats 90-92%. May be retaining. Check ABG. CXR. Consider tox. Daughter reports previous unintentional overdose on her medications when confused.   3:46 PM Complaining of feeling cold. No new complaints. Continues to deny pain. IVF infusing. Has PIV L hand. Will also access port.    Annie Main  Wilson Singer, MD 08/23/15 1426

## 2015-08-18 NOTE — ED Notes (Signed)
Dr Wilson Singer at bedside . Pulled ET to 22 at the lip. GAve order to bolus 20 mg of Diprovan now

## 2015-08-18 NOTE — H&P (Signed)
PCP:   [name], MD   Chief Complaint:  confused  HPI: 60 yo female h/o COPD, CAD, chronic pain d/c yesterday from hospital (no discharge summary) for HCAP.  Pt reports she was sent home in an ambulance, and no oxygen was arranged for her at home.  Difficult to ascertain the details of the discharge plan but it appears she did not qualify for home oxygen.  She lives alone.  Family went to check on her today and she was confused.  On arrival to ED her oxgyen sats were mid 78s.  Unknown what medications she was sent home on, and if she has taken any today.  Cannot obtain much reliable history from patient.  Referred for admission for hypoxia and confusion.  Review of Systems:  Unobtainable from pt due to confusion  Past Medical History: Past Medical History  Diagnosis Date  . Coronary artery disease     status post stenting of the aortic and iliac vessels by Dr. Gwenlyn Found. Repeat aortic stenting 2010  . Left ventricular dysfunction     hx of with ejection fraction of 35% range.  . Carotid artery disease (HCC)     hx of carotid cerebrovascular disease with 0-39% bilateral internal carotid artery setenosis   . Non Hodgkin's lymphoma ([place])   . Chronic back pain     status post lumbar surgery  . Hyperlipidemia   . Hypertension   . COPD (chronic obstructive pulmonary disease) ([place])   . Shortness of breath   . Cancer (HCC)     hx of non hogdin lymphoma  . CHF (congestive heart failure) ([place])   . Blood transfusion   . Arthritis   . Myocardial infarction ([place])   . Peripheral vascular disease ([place])   . GERD (gastroesophageal reflux disease)   . Anemia   . Osteoporosis   . Depression   . Anxiety   . Other malignant lymphomas, unspecified site, extranodal and solid organ sites 2004    Back  . Asthma   . Vulvar cancer ([place])   . Anginal pain ([place])     occ last time last wk  . Dysrhythmia     ?  . Stroke ([place]) 04  . Pneumonia     hx  . History of kidney stones   . Seizures  ([place])     ? yrs ago  . Lymphocytic lymphoma ([place]) 12/24/2013  . Non-Hodgkin lymphoma, B-cell, low grade 12/24/2013  . Atherosclerosis of native arteries of the extremities with rest pain 12/10/2008    Qualifier: Diagnosis of  By: Owens Shark, RN, BSN, Lauren    . Langerhans cell histiocytosis of lung, 2009 11/10/2013  . PVD 10/27/2008    Qualifier: Diagnosis of  By: Owens Shark, RN, BSN, Lauren    . COPD 10/27/2008    Qualifier: Diagnosis of  By: Owens Shark RN, BSN, Lauren     Past Surgical History  Procedure Laterality Date  . [place] surg    . Back surgery      x 2  . Lung biopsy    . Cardiac catheterization    . Abdominal aortogram  08/08/2011  . Abdominal hysterectomy      partial  . Portacath placement      pt says its for chemo only and its an old port(04)   . Dilation and curettage of uterus    . Vulva /perineum biopsy  04/09/2012    Procedure: VULVAR BIOPSY;  Surgeon: Florian Buff, MD;  Location: [facility];  Service:  Gynecology;  Laterality: N/A;  . Vulvectomy  05/20/2012    Procedure: VULVECTOMY;  Surgeon: Alvino Chapel, MD;  Location: [facility];  Service: Gynecology;  Laterality: Bilateral;  BILATERAL SIMPLE VULVECTOMIES  . Video assisted thoracoscopy (vats)/wedge resection Right 12/02/2013    Procedure: VIDEO ASSISTED THORACOSCOPY (VATS)/WEDGE RESECTION;  Surgeon: Melrose Nakayama, MD;  Location: [facility];  Service: Thoracic;  Laterality: Right;  . Abdominal aortagram N/A 08/08/2011    Procedure: ABDOMINAL Maxcine Ham;  Surgeon: Sherren Mocha, MD;  Location: [facility] CATH LAB;  Service: Cardiovascular;  Laterality: N/A;  . Percutaneous stent intervention Bilateral 08/08/2011    Procedure: PERCUTANEOUS STENT INTERVENTION;  Surgeon: Sherren Mocha, MD;  Location: [facility] CATH LAB;  Service: Cardiovascular;  Laterality: Bilateral;    Medications: Prior to Admission medications   Medication Sig Start Date End Date Taking? Authorizing Provider  albuterol (PROVENTIL) (2.5 MG/3ML) 0.083% nebulizer solution  Take 3 mLs (2.5 mg total) by nebulization every 4 (four) hours as needed for wheezing or shortness of breath. 08/17/15  Yes Lucia Gaskins, MD  albuterol (VENTOLIN HFA) 108 (90 BASE) MCG/ACT inhaler Inhale 2 puffs into the lungs every 6 (six) hours as needed for wheezing or shortness of breath (wheezing and shortness of breath). Reported on 08/09/2015   Yes Historical Provider, MD  ALPRAZolam Duanne Moron) 1 MG tablet Take 1 mg by mouth 3 (three) times daily as needed for anxiety or sleep (sleep).    Yes Historical Provider, MD  amitriptyline (ELAVIL) 50 MG tablet Take 50 mg by mouth at bedtime.   Yes Historical Provider, MD  aspirin 81 MG EC tablet Take 81 mg by mouth daily.     Yes Historical Provider, MD  Biotin 1 MG CAPS Take 1 tablet by mouth daily. Reported on 08/09/2015   Yes Historical Provider, MD  BREO ELLIPTA 100-25 MCG/INH AEPB Take 1 Inhaler by mouth 2 (two) times daily. 07/06/15  Yes Historical Provider, MD  buPROPion (WELLBUTRIN XL) 150 MG 24 hr tablet Take 150 mg by mouth daily.   Yes Historical Provider, MD  clopidogrel (PLAVIX) 75 MG tablet TAKE 1 TABLET DAILY WITH BREAKFAST. 12/21/14  Yes Sherren Mocha, MD  cycloSPORINE (RESTASIS) 0.05 % ophthalmic emulsion Place 1 drop into both eyes 2 (two) times daily.   Yes Historical Provider, MD  diazepam (VALIUM) 5 MG tablet Take 5 mg by mouth every 6 (six) hours as needed for muscle spasms (muscle spasms). For muscle spasms   Yes Historical Provider, MD  furosemide (LASIX) 40 MG tablet Take 40 mg by mouth daily as needed for fluid (fluid).  05/19/13  Yes Historical Provider, MD  isosorbide mononitrate (IMDUR) 30 MG 24 hr tablet TAKE 1/2 TABLET BY MOUTH DAILY. 12/21/14  Yes Sherren Mocha, MD  metoprolol (LOPRESSOR) 50 MG tablet TAKE (1/2) TABLET BY MOUTH TWICE DAILY. 07/06/15  Yes Sherren Mocha, MD  mupirocin ointment (BACTROBAN) 2 % Apply 1 application topically daily as needed (leg infection). Reported on 08/09/2015 03/13/12  Yes Historical Provider, MD   NEXIUM 40 MG capsule Take 40 mg by mouth daily.  12/29/11  Yes Historical Provider, MD  oxycodone (ROXICODONE) 30 MG immediate release tablet Take 30 mg by mouth every 4 (four) hours as needed for pain (pain). For breakthrough pain   Yes Historical Provider, MD  OXYCONTIN 80 MG 12 hr tablet Take 80 mg by mouth 4 (four) times daily as needed. 07/27/15  Yes Historical Provider, MD  predniSONE (DELTASONE) 50 MG tablet Take 1 tablet (50 mg total) by mouth daily  with breakfast. 08/17/15  Yes Lucia Gaskins, MD  rosuvastatin (CRESTOR) 40 MG tablet Take 40 mg by mouth daily.     Yes Historical Provider, MD  sulfaDIAZINE 500 MG tablet Take 1 tablet by mouth 2 (two) times daily. Only on weekends   Yes Historical Provider, MD  sulfamethoxazole-trimethoprim (BACTRIM DS) 800-160 MG per tablet Take 1 tablet by mouth 2 (two) times a week. Reported on 08/09/2015   Yes Historical Provider, MD  SYMBICORT 160-4.5 MCG/ACT inhaler Inhale 2 puffs into the lungs every 12 (twelve) hours.  06/01/14  Yes Historical Provider, MD  vitamin B-12 (CYANOCOBALAMIN) 100 MCG tablet Take 100 mcg by mouth daily.   Yes Historical Provider, MD  NITROSTAT 0.4 MG SL tablet DISSOLVE 1 TABLET UNDER TONGUE EVERY 5 MINUTES UP TO 15 MINUTES FOR CHEST PAIN. IF NO RELIEF CALL 911. 08/27/14   Sherren Mocha, MD    Allergies:   Allergies  Allergen Reactions  . Morphine And Related Other (See Comments)    Unknown reaction   . Naproxen     Made me feel "loopy" in the head    Social History:  reports that she has been smoking Cigarettes.  She has a 100 pack-year smoking history. She has never used smokeless tobacco. She reports that she does not drink alcohol or use illicit drugs.  Family History: Family History  Problem Relation Age of Onset  . Coronary artery disease      positive for vascular and cardiac disease  . Heart disease Mother   . Lung cancer Mother   . Heart attack Mother   . Heart failure Mother   . Heart disease Father    . Kidney cancer Father   . Heart attack Father   . Heart failure Father   . Diabetes Sister   . Aortic aneurysm Sister   . Heart attack Brother   . Asthma Son   . Sleep apnea Son   . Endometriosis Daughter     Physical Exam: Filed Vitals:   08/18/15 1930 08/18/15 1942 08/18/15 2000 08/18/15 2030  BP: 101/73  121/62 116/70  Pulse: 106  72 84  Temp:      TempSrc:      Resp: 15  25 24   Height:      Weight:      SpO2: 86% 93% 91% 94%   General appearance: alert, cooperative, no distress and slowed mentation Head: Normocephalic, without obvious abnormality, atraumatic Eyes: negative Nose: Nares normal. Septum midline. Mucosa normal. No drainage or sinus tenderness. Neck: no JVD and supple, symmetrical, trachea midline Lungs: diminished breath sounds bilaterally Heart: regular rate and rhythm, S1, S2 normal, no murmur, click, rub or gallop Abdomen: soft, non-tender; bowel sounds normal; no masses,  no organomegaly Extremities: extremities normal, atraumatic, no cyanosis or edema Pulses: 2+ and symmetric Skin: Skin color, texture, turgor normal. No rashes or lesions Neurologic: Grossly normal except not oriented to time    Labs on Admission:   Recent Labs  08/17/15 0559 08/18/15 1529  NA 141 138  K 4.0 3.4*  CL 97* 95*  CO2 34* 33*  GLUCOSE 109* 100*  BUN 47* 35*  CREATININE 1.56* 1.35*  CALCIUM 8.2* 8.0*    Recent Labs  08/16/15 0557  AST 24  ALT 27  ALKPHOS 93  BILITOT 0.5  PROT 5.6*  ALBUMIN 2.5*    Recent Labs  08/16/15 0557 08/18/15 1529  WBC 8.2 7.3  NEUTROABS 7.6 6.3  HGB 11.2* 11.7*  HCT 33.3* 34.7*  MCV 90.2 89.9  PLT 146* 137*    Recent Labs  08/18/15 1529  TROPONINI 0.03    Radiological Exams on Admission: Dg Chest Portable 1 View  08/18/2015  CLINICAL DATA:  Hypoxia.  Shortness of breath. EXAM: PORTABLE CHEST - 1 VIEW COMPARISON:  Two-view chest x-ray 08/09/2015. FINDINGS: Chronic diffuse interstitial coarsening is again  noted bilaterally. Previously-seen superimposed airspace disease has cleared. The heart size is normal. A left subclavian Port-A-Cath is stable. IMPRESSION: 1. Interval clearing of right-sided airspace disease. 2. Chronic interstitial coarsening is stable. 3. No acute cardiopulmonary disease. Electronically Signed   By: San Morelle M.D.   On: 08/18/2015 15:51   Old chart reviewed cxr reviewed improved  Assessment/Plan  61 yo female with recent hospitalization for HCAP, copde comes in with encephalopathy  Principal Problem:   Encephalopathy- likely multifactorial from hypoxia and could also be from several sedatives she takes chronically.  Hold all sedatives at this time.  Not particularly hypercapneic.  cxr improved.    Active Problems:   Acute respiratory failure with hypoxia ([place])- noted, try to qualify for home oxygen again and minimized sedating medications at home   HYPERTENSION, BENIGN- noted, cont home meds   CAD, NATIVE VESSEL- noted   Vulvar cancer ([place])- noted   HCAP (healthcare-associated pneumonia)- will have pharmacy clarify what medications she was discharged on yesterday and resume   COPD (chronic obstructive pulmonary disease) ([place])- cont home po prednisone and nebs  Admit to tele.  pcp dr Luan Pulling.  Request pharm to reconcile meds and call to MD.  Derrill Kay A 08/18/2015, 9:38 PM

## 2015-08-18 NOTE — Progress Notes (Signed)
Pharmacy Antibiotic Note  [name] is a 60 y.o. female admitted on 08/18/2015 with pneumonia.  Pharmacy has been consulted for vancomycin dosing.  Plan: Vancomycin 1 gm IV X 1 now then 500 mg IV q12 hours F/u renal function, cultures and clinical course  Height: 5\' 5"  (165.1 cm) Weight: 125 lb (56.7 kg) IBW/kg (Calculated) : 57  Temp (24hrs), Avg:98.8 F (37.1 C), Min:98.6 F (37 C), Max:98.9 F (37.2 C)   Recent Labs Lab 08/14/15 0700 08/15/15 0528 08/16/15 0557 08/17/15 0559 08/18/15 1529  WBC  --   --  8.2  --  7.3  CREATININE 2.22* 2.06* 1.73* 1.56* 1.35*  LATICACIDVEN  --   --   --   --  0.8    Estimated Creatinine Clearance: 41.2 mL/min (by C-G formula based on Cr of 1.35).    Allergies  Allergen Reactions  . Morphine And Related Other (See Comments)    Unknown reaction   . Naproxen     Made me feel "loopy" in the head    Antimicrobials this admission: vanc 2/16 >>  Zosyn 3.375 X 1 2/16   Thank you for allowing pharmacy to be a part of this patient's care.  Beverlee Nims 08/18/2015 4:51 PM

## 2015-08-18 NOTE — ED Notes (Signed)
Resident more alert and talking with staff. Sitting up in bed. Requested bed pan

## 2015-08-19 LAB — BASIC METABOLIC PANEL
ANION GAP: 9 (ref 5–15)
BUN: 29 mg/dL — ABNORMAL HIGH (ref 6–20)
CALCIUM: 8 mg/dL — AB (ref 8.9–10.3)
CO2: 32 mmol/L (ref 22–32)
Chloride: 101 mmol/L (ref 101–111)
Creatinine, Ser: 1.23 mg/dL — ABNORMAL HIGH (ref 0.44–1.00)
GFR calc non Af Amer: 48 mL/min — ABNORMAL LOW (ref >60)
GFR, EST AFRICAN AMERICAN: 55 mL/min — AB (ref >60)
Glucose, Bld: 67 mg/dL (ref 65–99)
POTASSIUM: 3.3 mmol/L — AB (ref 3.5–5.1)
Sodium: 142 mmol/L (ref 135–145)

## 2015-08-19 LAB — BLOOD GAS, ARTERIAL
ACID-BASE EXCESS: 8.1 mmol/L — AB (ref 0.0–2.0)
BICARBONATE: 31.2 meq/L — AB (ref 20.0–24.0)
Drawn by: 277331
O2 CONTENT: 2 L/min
O2 SAT: 90.7 %
PCO2 ART: 45.5 mmHg — AB (ref 35.0–45.0)
PO2 ART: 64.1 mmHg — AB (ref 80.0–100.0)
pH, Arterial: 7.463 — ABNORMAL HIGH (ref 7.350–7.450)

## 2015-08-19 LAB — CBC
HEMATOCRIT: 36.5 % (ref 36.0–46.0)
HEMOGLOBIN: 12.2 g/dL (ref 12.0–15.0)
MCH: 29.8 pg (ref 26.0–34.0)
MCHC: 33.4 g/dL (ref 30.0–36.0)
MCV: 89 fL (ref 78.0–100.0)
Platelets: 149 10*3/uL — ABNORMAL LOW (ref 150–400)
RBC: 4.1 MIL/uL (ref 3.87–5.11)
RDW: 15.7 % — ABNORMAL HIGH (ref 11.5–15.5)
WBC: 8 10*3/uL (ref 4.0–10.5)

## 2015-08-19 LAB — AMMONIA: AMMONIA: 23 umol/L (ref 9–35)

## 2015-08-19 MED ORDER — POTASSIUM CHLORIDE CRYS ER 20 MEQ PO TBCR
20.0000 meq | EXTENDED_RELEASE_TABLET | Freq: Two times a day (BID) | ORAL | Status: DC
  Filled 2015-08-19 (×2): qty 1

## 2015-08-19 MED ORDER — ACETAMINOPHEN 325 MG PO TABS
650.0000 mg | ORAL_TABLET | ORAL | Status: DC | PRN
  Administered 2015-08-19: 650 mg via ORAL
  Filled 2015-08-19: qty 2

## 2015-08-19 NOTE — Care Management Important Message (Signed)
Important Message  Patient Details  Name: [name] MRN: [id number] Date of Birth: [birth year]   Medicare Important Message Given:  Yes    Alvie Heidelberg, RN 08/19/2015, 4:37 PM

## 2015-08-19 NOTE — Progress Notes (Signed)
Subjective: She was admitted with encephalopathy. We don't have a definitive cause at this point. She does have COPD and she was significantly hypoxic when she arrived. Part of the problem may be her multiple medications which could be sedating. She has been on most of these medications for several years.  Objective: Vital signs in last 24 hours: Temp:  [98.5 F (36.9 C)-98.9 F (37.2 C)] 98.9 F (37.2 C) (02/17 0645) Pulse Rate:  [70-106] 92 (02/17 0645) Resp:  [12-25] 18 (02/17 0645) BP: (82-129)/(56-78) 129/78 mmHg (02/17 0645) SpO2:  [86 %-100 %] 94 % (02/17 0645) Weight:  [56.7 kg (125 lb)-57.425 kg (126 lb 9.6 oz)] 57.425 kg (126 lb 9.6 oz) (02/17 0725) Weight change:  Last BM Date: 08/16/15  Intake/Output from previous day: 02/16 0701 - 02/17 0700 In: 2250 [I.V.:2250] Out: -   PHYSICAL EXAM General appearance: Sleepy but arousable Resp: clear to auscultation bilaterally Cardio: regular rate and rhythm, S1, S2 normal, no murmur, click, rub or gallop GI: soft, non-tender; bowel sounds normal; no masses,  no organomegaly Extremities: extremities normal, atraumatic, no cyanosis or edema  Lab Results:  Results for orders placed or performed during the hospital encounter of 08/18/15 (from the past 48 hour(s))  CBG monitoring, ED     Status: Abnormal   Collection Time: 08/18/15  3:23 PM  Result Value Ref Range   Glucose-Capillary 100 (H) 65 - 99 mg/dL  CBC with Differential     Status: Abnormal   Collection Time: 08/18/15  3:29 PM  Result Value Ref Range   WBC 7.3 4.0 - 10.5 K/uL   RBC 3.86 (L) 3.87 - 5.11 MIL/uL   Hemoglobin 11.7 (L) 12.0 - 15.0 g/dL   HCT 34.7 (L) 36.0 - 46.0 %   MCV 89.9 78.0 - 100.0 fL   MCH 30.3 26.0 - 34.0 pg   MCHC 33.7 30.0 - 36.0 g/dL   RDW 16.0 (H) 11.5 - 15.5 %   Platelets 137 (L) 150 - 400 K/uL   Neutrophils Relative % 86 %   Neutro Abs 6.3 1.7 - 7.7 K/uL   Lymphocytes Relative 6 %   Lymphs Abs 0.4 (L) 0.7 - 4.0 K/uL   Monocytes  Relative 7 %   Monocytes Absolute 0.5 0.1 - 1.0 K/uL   Eosinophils Relative 1 %   Eosinophils Absolute 0.1 0.0 - 0.7 K/uL   Basophils Relative 0 %   Basophils Absolute 0.0 0.0 - 0.1 K/uL  Basic metabolic panel     Status: Abnormal   Collection Time: 08/18/15  3:29 PM  Result Value Ref Range   Sodium 138 135 - 145 mmol/L   Potassium 3.4 (L) 3.5 - 5.1 mmol/L   Chloride 95 (L) 101 - 111 mmol/L   CO2 33 (H) 22 - 32 mmol/L   Glucose, Bld 100 (H) 65 - 99 mg/dL   BUN 35 (H) 6 - 20 mg/dL   Creatinine, Ser 1.35 (H) 0.44 - 1.00 mg/dL   Calcium 8.0 (L) 8.9 - 10.3 mg/dL   GFR calc non Af Amer 43 (L) >60 mL/min   GFR calc Af Amer 49 (L) >60 mL/min    Comment: (NOTE) The eGFR has been calculated using the CKD EPI equation. This calculation has not been validated in all clinical situations. eGFR's persistently <60 mL/min signify possible Chronic Kidney Disease.    Anion gap 10 5 - 15  Troponin I     Status: None   Collection Time: 08/18/15  3:29 PM  Result  Value Ref Range   Troponin I 0.03 <0.031 ng/mL    Comment:        NO INDICATION OF MYOCARDIAL INJURY.   Lactic acid, plasma     Status: None   Collection Time: 08/18/15  3:29 PM  Result Value Ref Range   Lactic Acid, Venous 0.8 0.5 - 2.0 mmol/L  Blood culture (routine x 2)     Status: None (Preliminary result)   Collection Time: 08/18/15  3:55 PM  Result Value Ref Range   Specimen Description BLOOD RIGHT HAND    Special Requests BOTTLES DRAWN AEROBIC ONLY 6CC    Culture NO GROWTH < 12 HOURS    Report Status PENDING   Urinalysis, Routine w reflex microscopic (not at [facility])     Status: Abnormal   Collection Time: 08/18/15  4:10 PM  Result Value Ref Range   Color, Urine YELLOW YELLOW   APPearance CLEAR CLEAR   Specific Gravity, Urine 1.010 1.005 - 1.030   pH 6.0 5.0 - 8.0   Glucose, UA NEGATIVE NEGATIVE mg/dL   Hgb urine dipstick TRACE (A) NEGATIVE   Bilirubin Urine NEGATIVE NEGATIVE   Ketones, ur NEGATIVE NEGATIVE mg/dL    Protein, ur NEGATIVE NEGATIVE mg/dL   Nitrite NEGATIVE NEGATIVE   Leukocytes, UA NEGATIVE NEGATIVE  Urine rapid drug screen (hosp performed)     Status: Abnormal   Collection Time: 08/18/15  4:10 PM  Result Value Ref Range   Opiates POSITIVE (A) NONE DETECTED   Cocaine NONE DETECTED NONE DETECTED   Benzodiazepines POSITIVE (A) NONE DETECTED   Amphetamines NONE DETECTED NONE DETECTED   Tetrahydrocannabinol NONE DETECTED NONE DETECTED   Barbiturates NONE DETECTED NONE DETECTED    Comment:        DRUG SCREEN FOR MEDICAL PURPOSES ONLY.  IF CONFIRMATION IS NEEDED FOR ANY PURPOSE, NOTIFY LAB WITHIN 5 DAYS.        LOWEST DETECTABLE LIMITS FOR URINE DRUG SCREEN Drug Class       Cutoff (ng/mL) Amphetamine      1000 Barbiturate      200 Benzodiazepine   798 Tricyclics       921 Opiates          300 Cocaine          300 THC              50   Urine microscopic-add on     Status: Abnormal   Collection Time: 08/18/15  4:10 PM  Result Value Ref Range   Squamous Epithelial / LPF 0-5 (A) NONE SEEN   WBC, UA 0-5 0 - 5 WBC/hpf   RBC / HPF 0-5 0 - 5 RBC/hpf   Bacteria, UA RARE (A) NONE SEEN  Blood culture (routine x 2)     Status: None (Preliminary result)   Collection Time: 08/18/15  4:15 PM  Result Value Ref Range   Specimen Description BLOOD LEFT ANTECUBITAL DRAWN BY RN    Special Requests BOTTLES DRAWN AEROBIC AND ANAEROBIC 5CC EACH    Culture NO GROWTH < 12 HOURS    Report Status PENDING   Blood gas, arterial ([facility] & [facility] ONLY)     Status: Abnormal   Collection Time: 08/18/15  4:52 PM  Result Value Ref Range   O2 Content 2.0 L/min   Delivery systems NASAL CANNULA    pH, Arterial 7.427 7.350 - 7.450   pCO2 arterial 47.7 (H) 35.0 - 45.0 mmHg   pO2, Arterial 59.2 (L) 80.0 - 100.0  mmHg   Bicarbonate 29.7 (H) 20.0 - 24.0 mEq/L   Acid-Base Excess 6.5 (H) 0.0 - 2.0 mmol/L   O2 Saturation 88.5 %   Collection site LEFT RADIAL    Drawn by [id number]    Sample type ARTERIAL DRAW    Allens test  (pass/fail) PASS PASS  Lactic acid, plasma     Status: None   Collection Time: 08/18/15  6:39 PM  Result Value Ref Range   Lactic Acid, Venous 0.5 0.5 - 2.0 mmol/L  Basic metabolic panel     Status: Abnormal   Collection Time: 08/19/15  6:40 AM  Result Value Ref Range   Sodium 142 135 - 145 mmol/L   Potassium 3.3 (L) 3.5 - 5.1 mmol/L   Chloride 101 101 - 111 mmol/L   CO2 32 22 - 32 mmol/L   Glucose, Bld 67 65 - 99 mg/dL   BUN 29 (H) 6 - 20 mg/dL   Creatinine, Ser 1.23 (H) 0.44 - 1.00 mg/dL   Calcium 8.0 (L) 8.9 - 10.3 mg/dL   GFR calc non Af Amer 48 (L) >60 mL/min   GFR calc Af Amer 55 (L) >60 mL/min    Comment: (NOTE) The eGFR has been calculated using the CKD EPI equation. This calculation has not been validated in all clinical situations. eGFR's persistently <60 mL/min signify possible Chronic Kidney Disease.    Anion gap 9 5 - 15  CBC     Status: Abnormal   Collection Time: 08/19/15  6:40 AM  Result Value Ref Range   WBC 8.0 4.0 - 10.5 K/uL   RBC 4.10 3.87 - 5.11 MIL/uL   Hemoglobin 12.2 12.0 - 15.0 g/dL   HCT 36.5 36.0 - 46.0 %   MCV 89.0 78.0 - 100.0 fL   MCH 29.8 26.0 - 34.0 pg   MCHC 33.4 30.0 - 36.0 g/dL   RDW 15.7 (H) 11.5 - 15.5 %   Platelets 149 (L) 150 - 400 K/uL    ABGS  Recent Labs  08/18/15 1652  PHART 7.427  PO2ART 59.2*  HCO3 29.7*   CULTURES Recent Results (from the past 240 hour(s))  MRSA PCR Screening     Status: None   Collection Time: 08/09/15 11:45 AM  Result Value Ref Range Status   MRSA by PCR NEGATIVE NEGATIVE Final    Comment:        The GeneXpert MRSA Assay (FDA approved for NASAL specimens only), is one component of a comprehensive MRSA colonization surveillance program. It is not intended to diagnose MRSA infection nor to guide or monitor treatment for MRSA infections.   Blood culture (routine x 2)     Status: None (Preliminary result)   Collection Time: 08/18/15  3:55 PM  Result Value Ref Range Status   Specimen  Description BLOOD RIGHT HAND  Final   Special Requests BOTTLES DRAWN AEROBIC ONLY 6CC  Final   Culture NO GROWTH < 12 HOURS  Final   Report Status PENDING  Incomplete  Blood culture (routine x 2)     Status: None (Preliminary result)   Collection Time: 08/18/15  4:15 PM  Result Value Ref Range Status   Specimen Description BLOOD LEFT ANTECUBITAL DRAWN BY RN  Final   Special Requests BOTTLES DRAWN AEROBIC AND ANAEROBIC 5CC EACH  Final   Culture NO GROWTH < 12 HOURS  Final   Report Status PENDING  Incomplete   Studies/Results: Dg Chest Portable 1 View  08/18/2015  CLINICAL DATA:  Hypoxia.  Shortness of breath. EXAM: PORTABLE CHEST - 1 VIEW COMPARISON:  Two-view chest x-ray 08/09/2015. FINDINGS: Chronic diffuse interstitial coarsening is again noted bilaterally. Previously-seen superimposed airspace disease has cleared. The heart size is normal. A left subclavian Port-A-Cath is stable. IMPRESSION: 1. Interval clearing of right-sided airspace disease. 2. Chronic interstitial coarsening is stable. 3. No acute cardiopulmonary disease. Electronically Signed   By: San Morelle M.D.   On: 08/18/2015 15:51    Medications:  Prior to Admission:  Prescriptions prior to admission  Medication Sig Dispense Refill Last Dose  . albuterol (PROVENTIL) (2.5 MG/3ML) 0.083% nebulizer solution Take 3 mLs (2.5 mg total) by nebulization every 4 (four) hours as needed for wheezing or shortness of breath. 75 mL 12 08/18/2015 at Unknown time  . albuterol (VENTOLIN HFA) 108 (90 BASE) MCG/ACT inhaler Inhale 2 puffs into the lungs every 6 (six) hours as needed for wheezing or shortness of breath (wheezing and shortness of breath). Reported on 08/09/2015   08/18/2015 at Unknown time  . ALPRAZolam (XANAX) 1 MG tablet Take 1 mg by mouth 3 (three) times daily as needed for anxiety or sleep (sleep).    08/17/2015 at Unknown time  . amitriptyline (ELAVIL) 50 MG tablet Take 50 mg by mouth at bedtime.   08/17/2015 at Unknown time   . aspirin 81 MG EC tablet Take 81 mg by mouth daily.     08/18/2015 at Unknown time  . Biotin 1 MG CAPS Take 1 tablet by mouth daily. Reported on 08/09/2015   08/18/2015 at Unknown time  . BREO ELLIPTA 100-25 MCG/INH AEPB Take 1 Inhaler by mouth 2 (two) times daily.   08/18/2015 at Unknown time  . buPROPion (WELLBUTRIN XL) 150 MG 24 hr tablet Take 150 mg by mouth daily.   08/18/2015 at Unknown time  . clopidogrel (PLAVIX) 75 MG tablet TAKE 1 TABLET DAILY WITH BREAKFAST. 30 tablet 6 08/18/2015 at Unknown time  . cycloSPORINE (RESTASIS) 0.05 % ophthalmic emulsion Place 1 drop into both eyes 2 (two) times daily.   08/18/2015 at Unknown time  . diazepam (VALIUM) 5 MG tablet Take 5 mg by mouth every 6 (six) hours as needed for muscle spasms (muscle spasms). For muscle spasms   08/18/2015 at Unknown time  . furosemide (LASIX) 40 MG tablet Take 40 mg by mouth daily as needed for fluid (fluid).    08/18/2015 at Unknown time  . isosorbide mononitrate (IMDUR) 30 MG 24 hr tablet TAKE 1/2 TABLET BY MOUTH DAILY. 45 tablet 6 08/18/2015 at Unknown time  . metoprolol (LOPRESSOR) 50 MG tablet TAKE (1/2) TABLET BY MOUTH TWICE DAILY. 30 tablet 10 08/18/2015 at Unknown time  . mupirocin ointment (BACTROBAN) 2 % Apply 1 application topically daily as needed (leg infection). Reported on 08/09/2015   08/18/2015 at Unknown time  . NEXIUM 40 MG capsule Take 40 mg by mouth daily.    08/18/2015 at Unknown time  . oxycodone (ROXICODONE) 30 MG immediate release tablet Take 30 mg by mouth every 4 (four) hours as needed for pain (pain). For breakthrough pain   08/18/2015 at Unknown time  . OXYCONTIN 80 MG 12 hr tablet Take 80 mg by mouth 4 (four) times daily as needed.   08/18/2015 at Unknown time  . predniSONE (DELTASONE) 50 MG tablet Take 1 tablet (50 mg total) by mouth daily with breakfast. 10 tablet 0 08/18/2015 at Unknown time  . rosuvastatin (CRESTOR) 40 MG tablet Take 40 mg by mouth daily.     08/18/2015 at Unknown  time  . sulfaDIAZINE 500  MG tablet Take 1 tablet by mouth 2 (two) times daily. Only on weekends   Past Month at Unknown time  . sulfamethoxazole-trimethoprim (BACTRIM DS) 800-160 MG per tablet Take 1 tablet by mouth 2 (two) times a week. Reported on 08/09/2015   08/18/2015 at Unknown time  . SYMBICORT 160-4.5 MCG/ACT inhaler Inhale 2 puffs into the lungs every 12 (twelve) hours.   11 08/18/2015 at Unknown time  . vitamin B-12 (CYANOCOBALAMIN) 100 MCG tablet Take 100 mcg by mouth daily.   08/18/2015 at Unknown time  . NITROSTAT 0.4 MG SL tablet DISSOLVE 1 TABLET UNDER TONGUE EVERY 5 MINUTES UP TO 15 MINUTES FOR CHEST PAIN. IF NO RELIEF CALL 911. 25 tablet 3 Taking   Scheduled: . amitriptyline  50 mg Oral QHS  . aspirin EC  81 mg Oral Daily  . budesonide-formoterol  2 puff Inhalation Q12H  . buPROPion  150 mg Oral Daily  . cycloSPORINE  1 drop Both Eyes BID  . isosorbide mononitrate  15 mg Oral Daily  . potassium chloride  20 mEq Oral BID  . predniSONE  50 mg Oral Q breakfast  . rosuvastatin  40 mg Oral Daily  . sodium chloride flush  3 mL Intravenous Q12H  . sodium chloride flush  3 mL Intravenous Q12H  . [START ON 08/20/2015] sulfaDIAZINE  500 mg Oral 2 times per day on Sun Sat  . vitamin B-12  100 mcg Oral Daily   Continuous:  ACZ:YSAYTK chloride, albuterol, albuterol, ALPRAZolam, alum & mag hydroxide-simeth, diazepam, furosemide, mupirocin ointment, ondansetron **OR** ondansetron (ZOFRAN) IV, sodium chloride flush  Assesment: She was admitted with acute encephalopathy which is metabolic. She has acute hypoxic respiratory failure. She has COPD at baseline. She also has had previous history of bulbar cancer and she has lymphoma on maintenance treatment. She has a history of chronic pain and has been on pain medications for many years that was started by her oncologist. Some of her encephalopathy may be related to that. Principal Problem:   Encephalopathy Active Problems:   HYPERTENSION, BENIGN   CAD, NATIVE VESSEL    Vulvar cancer ([place])   HCAP (healthcare-associated pneumonia)   Acute respiratory failure with hypoxia (HCC)   COPD (chronic obstructive pulmonary disease) ([place])    Plan: Continue current treatments. I will go ahead and check ammonia level.    LOS: 1 day   Smith Mcnicholas L 08/19/2015, 8:59 AM

## 2015-08-19 NOTE — Progress Notes (Addendum)
Went to see patient to give AM meds. Pt is lethargic, wont open eye and confused. Pt is complaining of pain in her mouth. Pt's tongue is white and pt states "its sore". Attempted to swollen a medication and got strangled and was not understanding the concept of swallow. Pt was moving pill around in mouth and couldn't drink. MD notified and ordered a ABG test and diflucan for pt's mouth. RN will hold off medications until results come back and reevaluate patient status. Oswald Hillock, RN

## 2015-08-19 NOTE — Progress Notes (Addendum)
Initial Nutrition Assessment  DOCUMENTATION CODES:  Non-severe (moderate) malnutrition in context of acute illness/injury   Pt meets criteria for MODERATE MALNUTRITION in the context of chronic illness as evidenced by mild muscle/fat wasting.  INTERVENTION:  Pt's AMS still unresolved. Recommend IV fluids to prevent dehydration. Per EMAR, pt has had no IV fluid yet today. Has gotten slight amount from IV meds.   Recommend making patient NPO at this time. Sister is spoon feeding patient and giving fluid. Worry about aspiration given pt's inability to swallow.   Monitor for resolution of AMS and ability to consume PO intake.    NUTRITION DIAGNOSIS:  Inadequate oral intake related to confusion/lethargy as evidenced by very minimal PO intake x 2 days  GOAL:  Patient will meet greater than or equal to 90% of their needs  MONITOR:  PO intake, Diet advancement, Labs, Resolution of AMS, workup for AMS  REASON FOR ASSESSMENT:  Malnutrition Screening Tool    ASSESSMENT:  60 y/o female PMHx of COPD, CAD, HTN, Cancer, CHF, Depression/Anxiety, GERD, HLD who was just discharged 2 days ago from [facility] for HCAP. She was unable to obtain oxygen for herself at home and represents after being found confused by family. Admitted for hypoxia and AMS.    Patient is currently unable to eat due to AMS. Pt's tongue sore and white. Got strangled when attempting to take medication. [name] unable to drink. Pt ordered diflucan. ABG ordered.   Spoke with pt's sister. She was quite frustrated as no one has been able to tell her what is going on with her pt. Sister feels like pt is acting like she has had a stroke: "She forgets how to swallow". Pt reportedly "blows instead of sucks through a straw". The little that pt is able to get down is regurgitated shortly after. Sister has been trying to keep hydrated by giving pudding/fluids. This may not be in best interest of pt given impaired swallow ability.    Pt was just  muttering "its hot, its hot" and other nonsensical phrases. She was moving jaw rapidly back and forth.   Spoke with RN and notified of patients inability to [place]. Notified that pt is at risk for dehydration in this state. RN noted pt has had no iv fluids yet today except from IV meds.   Pt was reportedly normal on discharge from the hospital ~ 2 days ago. That was the last time pt had any substantial nutrition.   Pt recently received chemotherapy for her lymphoma. Asked if pt ever had similar reaction from chemo. Sister does not believe so.   NFPE: Some mild fat/muscle wasting evident. This is likely more related to her chronic disease states vs her acute process  Labs reviewed: Slightly hypokalemic, Increased BUN/Creat  Diet Order:  Diet Heart Room service appropriate?: Yes; Fluid consistency:: Thin  Skin:  Dry, flaky, Ecchymotic arm/leg, skin tear elbow,   Last BM:  Unknown  Height:  Ht Readings from Last 1 Encounters:  08/18/15 5\' 5"  (1.651 m)   Weight:  Wt Readings from Last 1 Encounters:  08/19/15 126 lb 9.6 oz (57.425 kg)   Wt Readings from Last 10 Encounters:  08/19/15 126 lb 9.6 oz (57.425 kg)  08/17/15 125 lb 14.8 oz (57.12 kg)  08/09/15 123 lb 9.6 oz (56.065 kg)  07/15/15 129 lb 6.4 oz (58.695 kg)  06/06/15 140 lb 6.4 oz (63.685 kg)  04/06/15 143 lb 4.8 oz (65 kg)  02/08/15 152 lb 8 oz (69.174 kg)  12/07/14 149 lb (67.586 kg)  09/14/14 159 lb 4.8 oz (72.258 kg)  08/31/14 159 lb 4.8 oz (72.258 kg)   Ideal Body Weight:  56.82 kg  BMI:  Body mass index is 21.07 kg/(m^2).  Estimated Nutritional Needs:  Kcal:  1700-1900 (30-33 kcal/kg bw) Protein:  75-86 g pro (1.3-1.5 g/kg bw) Fluid:  1.7-1.9 liters fluid  EDUCATION NEEDS:  No education needs identified at this time  Burtis Junes RD, LDN Nutrition Pager: [phone] 08/19/2015 2:52 PM

## 2015-08-19 NOTE — Progress Notes (Signed)
[facility]  Patient Status: Active (receiving services up to time of hospitalization)  [facility] is providing the following services: RN  If patient discharges after hours, please call [phone].   [name] 08/19/2015, 4:07 PM

## 2015-08-19 NOTE — Progress Notes (Signed)
Pt is still lethargic and wont open eyes. ABG results came back and MD notified/aware. Ordered to still hold medication due to confusion. RN will continue to monitor. Oswald Hillock, RN

## 2015-08-19 NOTE — Care Management Note (Addendum)
Case Management Note  Patient Details  Name: MASHIYA SICAIROS MRN: [id number] Date of Birth: 31-Jul-1955  Subjective/Objective:           To room to speak with patient , patient confused at this time but stated that she remembers me from last time that she was here, Cannot tell me what happened. Patient was discharged to home ,  2 days ago and was found confused by [facility] RN. Patient returned to the ED with O2 sats in the mid 80s and confusion. Patient had ambulated 250 feet with PT at last admission and no recommendation was made for [facility]. However Rn was setup with [place]. Patient had been ambulated but RN prior to discharge and did not qualify for Home O2. With O2 saturations charted as 91% RA ambulating and 95% on RA at rest.  Readmission called to Dr Reynaldo Minium per protocol.  More to follow.   Action/Plan:  Continue to follow.  Expected Discharge Date:  08/20/15               Expected Discharge Plan:  Skilled Nursing Facility  In-House Referral:  Clinical Social Work  Discharge planning Services  CM Consult  Post Acute Care Choice:    Choice offered to:     DME Arranged:    DME Agency:     HH Arranged:    [place] Agency:     Status of Service:  In process, will continue to follow  Medicare Important Message Given:    Date Medicare IM Given:    Medicare IM give by:    Date Additional Medicare IM Given:    Additional Medicare Important Message give by:     If discussed at [place] of Stay Meetings, dates discussed:    Additional Comments:  Alvie Heidelberg, RN 08/19/2015, 3:38 PM

## 2015-08-20 ENCOUNTER — Inpatient Hospital Stay (HOSPITAL_COMMUNITY): Payer: Medicare HMO

## 2015-08-20 DIAGNOSIS — E440 Moderate protein-calorie malnutrition: Secondary | ICD-10-CM | POA: Insufficient documentation

## 2015-08-20 LAB — COMPREHENSIVE METABOLIC PANEL
ALK PHOS: 85 U/L (ref 38–126)
ALT: 25 U/L (ref 14–54)
ANION GAP: 11 (ref 5–15)
AST: 21 U/L (ref 15–41)
Albumin: 2.9 g/dL — ABNORMAL LOW (ref 3.5–5.0)
BILIRUBIN TOTAL: 0.7 mg/dL (ref 0.3–1.2)
BUN: 23 mg/dL — ABNORMAL HIGH (ref 6–20)
CALCIUM: 8.3 mg/dL — AB (ref 8.9–10.3)
CO2: 30 mmol/L (ref 22–32)
CREATININE: 1.14 mg/dL — AB (ref 0.44–1.00)
Chloride: 99 mmol/L — ABNORMAL LOW (ref 101–111)
GFR, EST NON AFRICAN AMERICAN: 52 mL/min — AB (ref >60)
Glucose, Bld: 131 mg/dL — ABNORMAL HIGH (ref 65–99)
Potassium: 3.8 mmol/L (ref 3.5–5.1)
SODIUM: 140 mmol/L (ref 135–145)
TOTAL PROTEIN: 5.9 g/dL — AB (ref 6.5–8.1)

## 2015-08-20 LAB — CBC WITH DIFFERENTIAL/PLATELET
BASOS ABS: 0 10*3/uL (ref 0.0–0.1)
BASOS PCT: 0 %
EOS ABS: 0.1 10*3/uL (ref 0.0–0.7)
EOS PCT: 1 %
HCT: 39 % (ref 36.0–46.0)
Hemoglobin: 13 g/dL (ref 12.0–15.0)
Lymphocytes Relative: 2 %
Lymphs Abs: 0.3 10*3/uL — ABNORMAL LOW (ref 0.7–4.0)
MCH: 30 pg (ref 26.0–34.0)
MCHC: 33.3 g/dL (ref 30.0–36.0)
MCV: 89.9 fL (ref 78.0–100.0)
MONO ABS: 0.3 10*3/uL (ref 0.1–1.0)
Monocytes Relative: 3 %
Neutro Abs: 12.4 10*3/uL — ABNORMAL HIGH (ref 1.7–7.7)
Neutrophils Relative %: 94 %
PLATELETS: 151 10*3/uL (ref 150–400)
RBC: 4.34 MIL/uL (ref 3.87–5.11)
RDW: 16 % — AB (ref 11.5–15.5)
WBC: 13.1 10*3/uL — AB (ref 4.0–10.5)

## 2015-08-20 MED ORDER — CLOTRIMAZOLE 10 MG MT TROC
10.0000 mg | Freq: Every day | OROMUCOSAL | Status: DC
  Administered 2015-08-20 – 2015-08-24 (×19): 10 mg via ORAL
  Filled 2015-08-20 (×31): qty 1

## 2015-08-20 MED ORDER — FLUCONAZOLE IN SODIUM CHLORIDE 100-0.9 MG/50ML-% IV SOLN
100.0000 mg | INTRAVENOUS | Status: DC
  Administered 2015-08-20 – 2015-08-23 (×4): 100 mg via INTRAVENOUS
  Filled 2015-08-20 (×6): qty 50

## 2015-08-20 MED ORDER — SODIUM CHLORIDE 0.9 % IV SOLN
INTRAVENOUS | Status: DC
  Administered 2015-08-20 – 2015-08-21 (×2): via INTRAVENOUS
  Administered 2015-08-21: 75 mL/h via INTRAVENOUS
  Administered 2015-08-22 – 2015-08-23 (×3): via INTRAVENOUS

## 2015-08-20 MED ORDER — HYDROCORTISONE NA SUCCINATE PF 100 MG IJ SOLR
50.0000 mg | Freq: Two times a day (BID) | INTRAMUSCULAR | Status: DC
  Administered 2015-08-20 – 2015-08-22 (×6): 50 mg via INTRAVENOUS
  Filled 2015-08-20 (×7): qty 2

## 2015-08-20 NOTE — Progress Notes (Addendum)
Subjective: She's better today as far as her mental status is concerned. She has been up to the bedside commode. [name] still having trouble swallowing. When told to open her eyes she tends to look up toward the ceiling. [name] able to communicate verbally.  Objective: Vital signs in last 24 hours: Temp:  [98 F (36.7 C)-98.5 F (36.9 C)] 98.4 F (36.9 C) (02/18 0511) Pulse Rate:  [71-85] 85 (02/18 0511) Resp:  [14-18] 14 (02/18 0511) BP: (91-132)/(47-62) 132/61 mmHg (02/18 0511) SpO2:  [92 %-99 %] 95 % (02/18 0511) Weight:  [56.926 kg (125 lb 8 oz)] 56.926 kg (125 lb 8 oz) (02/18 0511) Weight change: 0.726 kg (1 lb 9.6 oz) Last BM Date: 08/16/15 (per report)  Intake/Output from previous day: 02/17 0701 - 02/18 0700 In: 120 [P.O.:120] Out: -   PHYSICAL EXAM General appearance: alert and mild distress Resp: rhonchi bilaterally Cardio: regular rate and rhythm, S1, S2 normal, no murmur, click, rub or gallop GI: soft, non-tender; bowel sounds normal; no masses,  no organomegaly Extremities: extremities normal, atraumatic, no cyanosis or edema  Lab Results:  Results for orders placed or performed during the hospital encounter of 08/18/15 (from the past 48 hour(s))  CBG monitoring, ED     Status: Abnormal   Collection Time: 08/18/15  3:23 PM  Result Value Ref Range   Glucose-Capillary 100 (H) 65 - 99 mg/dL  CBC with Differential     Status: Abnormal   Collection Time: 08/18/15  3:29 PM  Result Value Ref Range   WBC 7.3 4.0 - 10.5 K/uL   RBC 3.86 (L) 3.87 - 5.11 MIL/uL   Hemoglobin 11.7 (L) 12.0 - 15.0 g/dL   HCT 34.7 (L) 36.0 - 46.0 %   MCV 89.9 78.0 - 100.0 fL   MCH 30.3 26.0 - 34.0 pg   MCHC 33.7 30.0 - 36.0 g/dL   RDW 16.0 (H) 11.5 - 15.5 %   Platelets 137 (L) 150 - 400 K/uL   Neutrophils Relative % 86 %   Neutro Abs 6.3 1.7 - 7.7 K/uL   Lymphocytes Relative 6 %   Lymphs Abs 0.4 (L) 0.7 - 4.0 K/uL   Monocytes Relative 7 %   Monocytes Absolute 0.5 0.1 - 1.0 K/uL    Eosinophils Relative 1 %   Eosinophils Absolute 0.1 0.0 - 0.7 K/uL   Basophils Relative 0 %   Basophils Absolute 0.0 0.0 - 0.1 K/uL  Basic metabolic panel     Status: Abnormal   Collection Time: 08/18/15  3:29 PM  Result Value Ref Range   Sodium 138 135 - 145 mmol/L   Potassium 3.4 (L) 3.5 - 5.1 mmol/L   Chloride 95 (L) 101 - 111 mmol/L   CO2 33 (H) 22 - 32 mmol/L   Glucose, Bld 100 (H) 65 - 99 mg/dL   BUN 35 (H) 6 - 20 mg/dL   Creatinine, Ser 1.35 (H) 0.44 - 1.00 mg/dL   Calcium 8.0 (L) 8.9 - 10.3 mg/dL   GFR calc non Af Amer 43 (L) >60 mL/min   GFR calc Af Amer 49 (L) >60 mL/min    Comment: (NOTE) The eGFR has been calculated using the CKD EPI equation. This calculation has not been validated in all clinical situations. eGFR's persistently <60 mL/min signify possible Chronic Kidney Disease.    Anion gap 10 5 - 15  Troponin I     Status: None   Collection Time: 08/18/15  3:29 PM  Result Value Ref  Range   Troponin I 0.03 <0.031 ng/mL    Comment:        NO INDICATION OF MYOCARDIAL INJURY.   Lactic acid, plasma     Status: None   Collection Time: 08/18/15  3:29 PM  Result Value Ref Range   Lactic Acid, Venous 0.8 0.5 - 2.0 mmol/L  Blood culture (routine x 2)     Status: None (Preliminary result)   Collection Time: 08/18/15  3:55 PM  Result Value Ref Range   Specimen Description BLOOD RIGHT HAND    Special Requests BOTTLES DRAWN AEROBIC ONLY 6CC    Culture NO GROWTH 2 DAYS    Report Status PENDING   Urinalysis, Routine w reflex microscopic (not at [facility])     Status: Abnormal   Collection Time: 08/18/15  4:10 PM  Result Value Ref Range   Color, Urine YELLOW YELLOW   APPearance CLEAR CLEAR   Specific Gravity, Urine 1.010 1.005 - 1.030   pH 6.0 5.0 - 8.0   Glucose, UA NEGATIVE NEGATIVE mg/dL   Hgb urine dipstick TRACE (A) NEGATIVE   Bilirubin Urine NEGATIVE NEGATIVE   Ketones, ur NEGATIVE NEGATIVE mg/dL   Protein, ur NEGATIVE NEGATIVE mg/dL   Nitrite NEGATIVE NEGATIVE    Leukocytes, UA NEGATIVE NEGATIVE  Urine rapid drug screen (hosp performed)     Status: Abnormal   Collection Time: 08/18/15  4:10 PM  Result Value Ref Range   Opiates POSITIVE (A) NONE DETECTED   Cocaine NONE DETECTED NONE DETECTED   Benzodiazepines POSITIVE (A) NONE DETECTED   Amphetamines NONE DETECTED NONE DETECTED   Tetrahydrocannabinol NONE DETECTED NONE DETECTED   Barbiturates NONE DETECTED NONE DETECTED    Comment:        DRUG SCREEN FOR MEDICAL PURPOSES ONLY.  IF CONFIRMATION IS NEEDED FOR ANY PURPOSE, NOTIFY LAB WITHIN 5 DAYS.        LOWEST DETECTABLE LIMITS FOR URINE DRUG SCREEN Drug Class       Cutoff (ng/mL) Amphetamine      1000 Barbiturate      200 Benzodiazepine   093 Tricyclics       235 Opiates          300 Cocaine          300 THC              50   Urine microscopic-add on     Status: Abnormal   Collection Time: 08/18/15  4:10 PM  Result Value Ref Range   Squamous Epithelial / LPF 0-5 (A) NONE SEEN   WBC, UA 0-5 0 - 5 WBC/hpf   RBC / HPF 0-5 0 - 5 RBC/hpf   Bacteria, UA RARE (A) NONE SEEN  Blood culture (routine x 2)     Status: None (Preliminary result)   Collection Time: 08/18/15  4:15 PM  Result Value Ref Range   Specimen Description BLOOD LEFT ANTECUBITAL DRAWN BY RN    Special Requests BOTTLES DRAWN AEROBIC AND ANAEROBIC 5CC EACH    Culture NO GROWTH 2 DAYS    Report Status PENDING   Blood gas, arterial ([facility] & [facility] ONLY)     Status: Abnormal   Collection Time: 08/18/15  4:52 PM  Result Value Ref Range   O2 Content 2.0 L/min   Delivery systems NASAL CANNULA    pH, Arterial 7.427 7.350 - 7.450   pCO2 arterial 47.7 (H) 35.0 - 45.0 mmHg   pO2, Arterial 59.2 (L) 80.0 - 100.0 mmHg   Bicarbonate  29.7 (H) 20.0 - 24.0 mEq/L   Acid-Base Excess 6.5 (H) 0.0 - 2.0 mmol/L   O2 Saturation 88.5 %   Collection site LEFT RADIAL    Drawn by [id number]    Sample type ARTERIAL DRAW    Allens test (pass/fail) PASS PASS  Lactic acid, plasma     Status: None    Collection Time: 08/18/15  6:39 PM  Result Value Ref Range   Lactic Acid, Venous 0.5 0.5 - 2.0 mmol/L  Basic metabolic panel     Status: Abnormal   Collection Time: 08/19/15  6:40 AM  Result Value Ref Range   Sodium 142 135 - 145 mmol/L   Potassium 3.3 (L) 3.5 - 5.1 mmol/L   Chloride 101 101 - 111 mmol/L   CO2 32 22 - 32 mmol/L   Glucose, Bld 67 65 - 99 mg/dL   BUN 29 (H) 6 - 20 mg/dL   Creatinine, Ser 1.23 (H) 0.44 - 1.00 mg/dL   Calcium 8.0 (L) 8.9 - 10.3 mg/dL   GFR calc non Af Amer 48 (L) >60 mL/min   GFR calc Af Amer 55 (L) >60 mL/min    Comment: (NOTE) The eGFR has been calculated using the CKD EPI equation. This calculation has not been validated in all clinical situations. eGFR's persistently <60 mL/min signify possible Chronic Kidney Disease.    Anion gap 9 5 - 15  CBC     Status: Abnormal   Collection Time: 08/19/15  6:40 AM  Result Value Ref Range   WBC 8.0 4.0 - 10.5 K/uL   RBC 4.10 3.87 - 5.11 MIL/uL   Hemoglobin 12.2 12.0 - 15.0 g/dL   HCT 36.5 36.0 - 46.0 %   MCV 89.0 78.0 - 100.0 fL   MCH 29.8 26.0 - 34.0 pg   MCHC 33.4 30.0 - 36.0 g/dL   RDW 15.7 (H) 11.5 - 15.5 %   Platelets 149 (L) 150 - 400 K/uL  Ammonia     Status: None   Collection Time: 08/19/15  9:28 AM  Result Value Ref Range   Ammonia 23 9 - 35 umol/L  Blood gas, arterial     Status: Abnormal   Collection Time: 08/19/15 12:00 PM  Result Value Ref Range   O2 Content 2.0 L/min   Delivery systems NASAL CANNULA    pH, Arterial 7.463 (H) 7.350 - 7.450   pCO2 arterial 45.5 (H) 35.0 - 45.0 mmHg   pO2, Arterial 64.1 (L) 80.0 - 100.0 mmHg   Bicarbonate 31.2 (H) 20.0 - 24.0 mEq/L   Acid-Base Excess 8.1 (H) 0.0 - 2.0 mmol/L   O2 Saturation 90.7 %   Collection site RIGHT RADIAL    Drawn by [id number]    Sample type ARTERIAL DRAW    Allens test (pass/fail) PASS PASS    ABGS  Recent Labs  08/19/15 1200  PHART 7.463*  PO2ART 64.1*  HCO3 31.2*   CULTURES Recent Results (from the past 240  hour(s))  Blood culture (routine x 2)     Status: None (Preliminary result)   Collection Time: 08/18/15  3:55 PM  Result Value Ref Range Status   Specimen Description BLOOD RIGHT HAND  Final   Special Requests BOTTLES DRAWN AEROBIC ONLY 6CC  Final   Culture NO GROWTH 2 DAYS  Final   Report Status PENDING  Incomplete  Blood culture (routine x 2)     Status: None (Preliminary result)   Collection Time: 08/18/15  4:15 PM  Result Value Ref  Range Status   Specimen Description BLOOD LEFT ANTECUBITAL DRAWN BY RN  Final   Special Requests BOTTLES DRAWN AEROBIC AND ANAEROBIC 5CC EACH  Final   Culture NO GROWTH 2 DAYS  Final   Report Status PENDING  Incomplete   Studies/Results: Dg Chest Portable 1 View  08/18/2015  CLINICAL DATA:  Hypoxia.  Shortness of breath. EXAM: PORTABLE CHEST - 1 VIEW COMPARISON:  Two-view chest x-ray 08/09/2015. FINDINGS: Chronic diffuse interstitial coarsening is again noted bilaterally. Previously-seen superimposed airspace disease has cleared. The heart size is normal. A left subclavian Port-A-Cath is stable. IMPRESSION: 1. Interval clearing of right-sided airspace disease. 2. Chronic interstitial coarsening is stable. 3. No acute cardiopulmonary disease. Electronically Signed   By: San Morelle M.D.   On: 08/18/2015 15:51    Medications:  Prior to Admission:  Prescriptions prior to admission  Medication Sig Dispense Refill Last Dose  . albuterol (PROVENTIL) (2.5 MG/3ML) 0.083% nebulizer solution Take 3 mLs (2.5 mg total) by nebulization every 4 (four) hours as needed for wheezing or shortness of breath. 75 mL 12 08/18/2015 at Unknown time  . albuterol (VENTOLIN HFA) 108 (90 BASE) MCG/ACT inhaler Inhale 2 puffs into the lungs every 6 (six) hours as needed for wheezing or shortness of breath (wheezing and shortness of breath). Reported on 08/09/2015   08/18/2015 at Unknown time  . ALPRAZolam (XANAX) 1 MG tablet Take 1 mg by mouth 3 (three) times daily as needed for  anxiety or sleep (sleep).    08/17/2015 at Unknown time  . amitriptyline (ELAVIL) 50 MG tablet Take 50 mg by mouth at bedtime.   08/17/2015 at Unknown time  . aspirin 81 MG EC tablet Take 81 mg by mouth daily.     08/18/2015 at Unknown time  . Biotin 1 MG CAPS Take 1 tablet by mouth daily. Reported on 08/09/2015   08/18/2015 at Unknown time  . BREO ELLIPTA 100-25 MCG/INH AEPB Take 1 Inhaler by mouth 2 (two) times daily.   08/18/2015 at Unknown time  . buPROPion (WELLBUTRIN XL) 150 MG 24 hr tablet Take 150 mg by mouth daily.   08/18/2015 at Unknown time  . clopidogrel (PLAVIX) 75 MG tablet TAKE 1 TABLET DAILY WITH BREAKFAST. 30 tablet 6 08/18/2015 at Unknown time  . cycloSPORINE (RESTASIS) 0.05 % ophthalmic emulsion Place 1 drop into both eyes 2 (two) times daily.   08/18/2015 at Unknown time  . diazepam (VALIUM) 5 MG tablet Take 5 mg by mouth every 6 (six) hours as needed for muscle spasms (muscle spasms). For muscle spasms   08/18/2015 at Unknown time  . furosemide (LASIX) 40 MG tablet Take 40 mg by mouth daily as needed for fluid (fluid).    08/18/2015 at Unknown time  . isosorbide mononitrate (IMDUR) 30 MG 24 hr tablet TAKE 1/2 TABLET BY MOUTH DAILY. 45 tablet 6 08/18/2015 at Unknown time  . metoprolol (LOPRESSOR) 50 MG tablet TAKE (1/2) TABLET BY MOUTH TWICE DAILY. 30 tablet 10 08/18/2015 at Unknown time  . mupirocin ointment (BACTROBAN) 2 % Apply 1 application topically daily as needed (leg infection). Reported on 08/09/2015   08/18/2015 at Unknown time  . NEXIUM 40 MG capsule Take 40 mg by mouth daily.    08/18/2015 at Unknown time  . oxycodone (ROXICODONE) 30 MG immediate release tablet Take 30 mg by mouth every 4 (four) hours as needed for pain (pain). For breakthrough pain   08/18/2015 at Unknown time  . OXYCONTIN 80 MG 12 hr tablet Take 80 mg  by mouth 4 (four) times daily as needed.   08/18/2015 at Unknown time  . predniSONE (DELTASONE) 50 MG tablet Take 1 tablet (50 mg total) by mouth daily with breakfast. 10  tablet 0 08/18/2015 at Unknown time  . rosuvastatin (CRESTOR) 40 MG tablet Take 40 mg by mouth daily.     08/18/2015 at Unknown time  . sulfaDIAZINE 500 MG tablet Take 1 tablet by mouth 2 (two) times daily. Only on weekends   Past Month at Unknown time  . sulfamethoxazole-trimethoprim (BACTRIM DS) 800-160 MG per tablet Take 1 tablet by mouth 2 (two) times a week. Reported on 08/09/2015   08/18/2015 at Unknown time  . SYMBICORT 160-4.5 MCG/ACT inhaler Inhale 2 puffs into the lungs every 12 (twelve) hours.   11 08/18/2015 at Unknown time  . vitamin B-12 (CYANOCOBALAMIN) 100 MCG tablet Take 100 mcg by mouth daily.   08/18/2015 at Unknown time  . NITROSTAT 0.4 MG SL tablet DISSOLVE 1 TABLET UNDER TONGUE EVERY 5 MINUTES UP TO 15 MINUTES FOR CHEST PAIN. IF NO RELIEF CALL 911. 25 tablet 3 Taking   Scheduled: . budesonide-formoterol  2 puff Inhalation Q12H  . clotrimazole  10 mg Oral 5 X Daily  . cycloSPORINE  1 drop Both Eyes BID  . fluconazole (DIFLUCAN) IV  100 mg Intravenous Q24H  . hydrocortisone sod succinate (SOLU-CORTEF) inj  50 mg Intravenous Q12H  . isosorbide mononitrate  15 mg Oral Daily  . sodium chloride flush  3 mL Intravenous Q12H  . sodium chloride flush  3 mL Intravenous Q12H   Continuous: . sodium chloride     ZOX:WRUEAV chloride, acetaminophen, albuterol, albuterol, mupirocin ointment, ondansetron **OR** ondansetron (ZOFRAN) IV, sodium chloride flush  Assesment: She was admitted with acute encephalopathy presumably due to healthcare associated pneumonia. However she certainly could have had some sort of an acute neurological event. She did not have CT on admission so I'm going to order that. [name] having trouble swallowing so I don't want her to try to swallow much at this point. She does have malnutrition but I'm afraid she will aspirate.  She has a history of lymphoma on maintenance therapy.  She has severe arterial disease in multiple areas so it would not be surprising if she has  some central nervous system vascular abnormality  She has COPD but her blood gas was okay.  She probably has thrush. Principal Problem:   Encephalopathy Active Problems:   HYPERTENSION, BENIGN   CAD, NATIVE VESSEL   Vulvar cancer ([place])   HCAP (healthcare-associated pneumonia)   Acute respiratory failure with hypoxia (HCC)   COPD (chronic obstructive pulmonary disease) (HCC)   Malnutrition of moderate degree    Plan: I have placed her on Mycelex troches  because she doesn't have to swallow those she's going to get Diflucan IV she'll have a CT of the brain continue with her other treatments. She will need speech PT etc. she will have an EEG because she could've had a seizure that precipitated this event. I'm going to put her on some IV fluids because she is having trouble swallowing and taking in nutrition   LOS: 2 days   HAWKINS,EDWARD L 08/20/2015, 10:53 AM

## 2015-08-21 LAB — CBC WITH DIFFERENTIAL/PLATELET
BASOS PCT: 0 %
Basophils Absolute: 0 10*3/uL (ref 0.0–0.1)
EOS ABS: 0.1 10*3/uL (ref 0.0–0.7)
Eosinophils Relative: 1 %
HCT: 37.6 % (ref 36.0–46.0)
HEMOGLOBIN: 12.6 g/dL (ref 12.0–15.0)
Lymphocytes Relative: 11 %
Lymphs Abs: 0.8 10*3/uL (ref 0.7–4.0)
MCH: 29.9 pg (ref 26.0–34.0)
MCHC: 33.5 g/dL (ref 30.0–36.0)
MCV: 89.1 fL (ref 78.0–100.0)
MONOS PCT: 4 %
Monocytes Absolute: 0.3 10*3/uL (ref 0.1–1.0)
NEUTROS PCT: 84 %
Neutro Abs: 6 10*3/uL (ref 1.7–7.7)
Platelets: 155 10*3/uL (ref 150–400)
RBC: 4.22 MIL/uL (ref 3.87–5.11)
RDW: 15.9 % — ABNORMAL HIGH (ref 11.5–15.5)
WBC: 7.1 10*3/uL (ref 4.0–10.5)

## 2015-08-21 LAB — BASIC METABOLIC PANEL
Anion gap: 9 (ref 5–15)
BUN: 22 mg/dL — ABNORMAL HIGH (ref 6–20)
CALCIUM: 8.3 mg/dL — AB (ref 8.9–10.3)
CHLORIDE: 104 mmol/L (ref 101–111)
CO2: 28 mmol/L (ref 22–32)
CREATININE: 1.04 mg/dL — AB (ref 0.44–1.00)
GFR, EST NON AFRICAN AMERICAN: 58 mL/min — AB (ref >60)
Glucose, Bld: 92 mg/dL (ref 65–99)
Potassium: 3.5 mmol/L (ref 3.5–5.1)
SODIUM: 141 mmol/L (ref 135–145)

## 2015-08-21 LAB — VITAMIN B12: VITAMIN B 12: 898 pg/mL (ref 180–914)

## 2015-08-21 MED ORDER — ACETAMINOPHEN 160 MG/5ML PO SOLN
650.0000 mg | Freq: Four times a day (QID) | ORAL | Status: DC | PRN
  Administered 2015-08-21 – 2015-08-22 (×3): 650 mg via ORAL
  Filled 2015-08-21 (×3): qty 20.3

## 2015-08-21 NOTE — Progress Notes (Signed)
Subjective: [name] better but still confused. CT yesterday was negative.  according to her sister she did not take any medications at all after she got home from her hospitalization. I think this is unlikely to be related to medications.  Objective: Vital signs in last 24 hours: Temp:  [98 F (36.7 C)-98.3 F (36.8 C)] 98.3 F (36.8 C) (02/19 0541) Pulse Rate:  [80-89] 80 (02/19 0541) Resp:  [16-19] 19 (02/19 0541) BP: (95-130)/(61-65) 124/61 mmHg (02/19 0541) SpO2:  [92 %-99 %] 94 % (02/19 0926) Weight:  [53.57 kg (118 lb 1.6 oz)] 53.57 kg (118 lb 1.6 oz) (02/19 0541) Weight change: -3.856 kg (-8 lb 8 oz) Last BM Date: 08/20/15  Intake/Output from previous day: 02/18 0701 - 02/19 0700 In: 1421.3 [P.O.:10; I.V.:1361.3; IV Piggyback:50] Out: 400 [Urine:400]  PHYSICAL EXAM General appearance: alert and Confused Resp: rhonchi bilaterally Cardio: regular rate and rhythm, S1, S2 normal, no murmur, click, rub or gallop GI: soft, non-tender; bowel sounds normal; no masses,  no organomegaly Extremities: extremities normal, atraumatic, no cyanosis or edema  Lab Results:  Results for orders placed or performed during the hospital encounter of 08/18/15 (from the past 48 hour(s))  Blood gas, arterial     Status: Abnormal   Collection Time: 08/19/15 12:00 PM  Result Value Ref Range   O2 Content 2.0 L/min   Delivery systems NASAL CANNULA    pH, Arterial 7.463 (H) 7.350 - 7.450   pCO2 arterial 45.5 (H) 35.0 - 45.0 mmHg   pO2, Arterial 64.1 (L) 80.0 - 100.0 mmHg   Bicarbonate 31.2 (H) 20.0 - 24.0 mEq/L   Acid-Base Excess 8.1 (H) 0.0 - 2.0 mmol/L   O2 Saturation 90.7 %   Collection site RIGHT RADIAL    Drawn by [id number]    Sample type ARTERIAL DRAW    Allens test (pass/fail) PASS PASS  CBC with Differential/Platelet     Status: Abnormal   Collection Time: 08/20/15 12:32 PM  Result Value Ref Range   WBC 13.1 (H) 4.0 - 10.5 K/uL   RBC 4.34 3.87 - 5.11 MIL/uL   Hemoglobin 13.0 12.0 -  15.0 g/dL   HCT 39.0 36.0 - 46.0 %   MCV 89.9 78.0 - 100.0 fL   MCH 30.0 26.0 - 34.0 pg   MCHC 33.3 30.0 - 36.0 g/dL   RDW 16.0 (H) 11.5 - 15.5 %   Platelets 151 150 - 400 K/uL   Neutrophils Relative % 94 %   Neutro Abs 12.4 (H) 1.7 - 7.7 K/uL   Lymphocytes Relative 2 %   Lymphs Abs 0.3 (L) 0.7 - 4.0 K/uL   Monocytes Relative 3 %   Monocytes Absolute 0.3 0.1 - 1.0 K/uL   Eosinophils Relative 1 %   Eosinophils Absolute 0.1 0.0 - 0.7 K/uL   Basophils Relative 0 %   Basophils Absolute 0.0 0.0 - 0.1 K/uL  Comprehensive metabolic panel     Status: Abnormal   Collection Time: 08/20/15 12:32 PM  Result Value Ref Range   Sodium 140 135 - 145 mmol/L   Potassium 3.8 3.5 - 5.1 mmol/L   Chloride 99 (L) 101 - 111 mmol/L   CO2 30 22 - 32 mmol/L   Glucose, Bld 131 (H) 65 - 99 mg/dL   BUN 23 (H) 6 - 20 mg/dL   Creatinine, Ser 1.14 (H) 0.44 - 1.00 mg/dL   Calcium 8.3 (L) 8.9 - 10.3 mg/dL   Total Protein 5.9 (L) 6.5 - 8.1 g/dL  Albumin 2.9 (L) 3.5 - 5.0 g/dL   AST 21 15 - 41 U/L   ALT 25 14 - 54 U/L   Alkaline Phosphatase 85 38 - 126 U/L   Total Bilirubin 0.7 0.3 - 1.2 mg/dL   GFR calc non Af Amer 52 (L) >60 mL/min   GFR calc Af Amer >60 >60 mL/min    Comment: (NOTE) The eGFR has been calculated using the CKD EPI equation. This calculation has not been validated in all clinical situations. eGFR's persistently <60 mL/min signify possible Chronic Kidney Disease.    Anion gap 11 5 - 15  CBC with Differential/Platelet     Status: Abnormal   Collection Time: 08/21/15  7:19 AM  Result Value Ref Range   WBC 7.1 4.0 - 10.5 K/uL   RBC 4.22 3.87 - 5.11 MIL/uL   Hemoglobin 12.6 12.0 - 15.0 g/dL   HCT 37.6 36.0 - 46.0 %   MCV 89.1 78.0 - 100.0 fL   MCH 29.9 26.0 - 34.0 pg   MCHC 33.5 30.0 - 36.0 g/dL   RDW 15.9 (H) 11.5 - 15.5 %   Platelets 155 150 - 400 K/uL   Neutrophils Relative % 84 %   Neutro Abs 6.0 1.7 - 7.7 K/uL   Lymphocytes Relative 11 %   Lymphs Abs 0.8 0.7 - 4.0 K/uL    Monocytes Relative 4 %   Monocytes Absolute 0.3 0.1 - 1.0 K/uL   Eosinophils Relative 1 %   Eosinophils Absolute 0.1 0.0 - 0.7 K/uL   Basophils Relative 0 %   Basophils Absolute 0.0 0.0 - 0.1 K/uL  Basic metabolic panel     Status: Abnormal   Collection Time: 08/21/15  7:19 AM  Result Value Ref Range   Sodium 141 135 - 145 mmol/L   Potassium 3.5 3.5 - 5.1 mmol/L   Chloride 104 101 - 111 mmol/L   CO2 28 22 - 32 mmol/L   Glucose, Bld 92 65 - 99 mg/dL   BUN 22 (H) 6 - 20 mg/dL   Creatinine, Ser 1.04 (H) 0.44 - 1.00 mg/dL   Calcium 8.3 (L) 8.9 - 10.3 mg/dL   GFR calc non Af Amer 58 (L) >60 mL/min   GFR calc Af Amer >60 >60 mL/min    Comment: (NOTE) The eGFR has been calculated using the CKD EPI equation. This calculation has not been validated in all clinical situations. eGFR's persistently <60 mL/min signify possible Chronic Kidney Disease.    Anion gap 9 5 - 15    ABGS  Recent Labs  08/19/15 1200  PHART 7.463*  PO2ART 64.1*  HCO3 31.2*   CULTURES Recent Results (from the past 240 hour(s))  Blood culture (routine x 2)     Status: None (Preliminary result)   Collection Time: 08/18/15  3:55 PM  Result Value Ref Range Status   Specimen Description BLOOD RIGHT HAND  Final   Special Requests BOTTLES DRAWN AEROBIC ONLY 6CC  Final   Culture NO GROWTH 3 DAYS  Final   Report Status PENDING  Incomplete  Blood culture (routine x 2)     Status: None (Preliminary result)   Collection Time: 08/18/15  4:15 PM  Result Value Ref Range Status   Specimen Description BLOOD LEFT ANTECUBITAL DRAWN BY RN  Final   Special Requests BOTTLES DRAWN AEROBIC AND ANAEROBIC 5CC EACH  Final   Culture NO GROWTH 3 DAYS  Final   Report Status PENDING  Incomplete   Studies/Results: Ct Head Wo  Contrast  08/20/2015  CLINICAL DATA:  Encephalopathy acute. Hx of stroke,seizures, non Hodgkins lymphoma, vulvar cancer. EXAM: CT HEAD WITHOUT CONTRAST TECHNIQUE: Contiguous axial images were obtained from the  base of the skull through the vertex without intravenous contrast. COMPARISON:  10/08/2008 FINDINGS: Ventricles are normal in size and configuration. There are no parenchymal masses or mass effect. There is no evidence of a cortical infarct. There are minor areas of subcortical white matter hypoattenuation consistent with chronic microvascular ischemic change. There are no extra-axial masses or abnormal fluid collections. There is no intracranial hemorrhage. There is significant sinus disease. The visualized maxillary sinuses are opacified. Right sphenoid sinus is mostly opacified with dependent fluid. Left sphenoid sinus is opacified. Most of the ethmoid air cells are opacified. Mastoid air cells and middle ear cavities are clear. IMPRESSION: 1. No acute intracranial abnormalities. 2. Significant sinus disease as detailed above including an air-fluid level in the right sphenoid sinus. Electronically Signed   By: Lajean Manes M.D.   On: 08/20/2015 14:00    Medications:  Prior to Admission:  Prescriptions prior to admission  Medication Sig Dispense Refill Last Dose  . albuterol (PROVENTIL) (2.5 MG/3ML) 0.083% nebulizer solution Take 3 mLs (2.5 mg total) by nebulization every 4 (four) hours as needed for wheezing or shortness of breath. 75 mL 12 08/18/2015 at Unknown time  . albuterol (VENTOLIN HFA) 108 (90 BASE) MCG/ACT inhaler Inhale 2 puffs into the lungs every 6 (six) hours as needed for wheezing or shortness of breath (wheezing and shortness of breath). Reported on 08/09/2015   08/18/2015 at Unknown time  . ALPRAZolam (XANAX) 1 MG tablet Take 1 mg by mouth 3 (three) times daily as needed for anxiety or sleep (sleep).    08/17/2015 at Unknown time  . amitriptyline (ELAVIL) 50 MG tablet Take 50 mg by mouth at bedtime.   08/17/2015 at Unknown time  . aspirin 81 MG EC tablet Take 81 mg by mouth daily.     08/18/2015 at Unknown time  . Biotin 1 MG CAPS Take 1 tablet by mouth daily. Reported on 08/09/2015    08/18/2015 at Unknown time  . BREO ELLIPTA 100-25 MCG/INH AEPB Take 1 Inhaler by mouth 2 (two) times daily.   08/18/2015 at Unknown time  . buPROPion (WELLBUTRIN XL) 150 MG 24 hr tablet Take 150 mg by mouth daily.   08/18/2015 at Unknown time  . clopidogrel (PLAVIX) 75 MG tablet TAKE 1 TABLET DAILY WITH BREAKFAST. 30 tablet 6 08/18/2015 at Unknown time  . cycloSPORINE (RESTASIS) 0.05 % ophthalmic emulsion Place 1 drop into both eyes 2 (two) times daily.   08/18/2015 at Unknown time  . diazepam (VALIUM) 5 MG tablet Take 5 mg by mouth every 6 (six) hours as needed for muscle spasms (muscle spasms). For muscle spasms   08/18/2015 at Unknown time  . furosemide (LASIX) 40 MG tablet Take 40 mg by mouth daily as needed for fluid (fluid).    08/18/2015 at Unknown time  . isosorbide mononitrate (IMDUR) 30 MG 24 hr tablet TAKE 1/2 TABLET BY MOUTH DAILY. 45 tablet 6 08/18/2015 at Unknown time  . metoprolol (LOPRESSOR) 50 MG tablet TAKE (1/2) TABLET BY MOUTH TWICE DAILY. 30 tablet 10 08/18/2015 at Unknown time  . mupirocin ointment (BACTROBAN) 2 % Apply 1 application topically daily as needed (leg infection). Reported on 08/09/2015   08/18/2015 at Unknown time  . NEXIUM 40 MG capsule Take 40 mg by mouth daily.    08/18/2015 at Unknown time  .  oxycodone (ROXICODONE) 30 MG immediate release tablet Take 30 mg by mouth every 4 (four) hours as needed for pain (pain). For breakthrough pain   08/18/2015 at Unknown time  . OXYCONTIN 80 MG 12 hr tablet Take 80 mg by mouth 4 (four) times daily as needed.   08/18/2015 at Unknown time  . predniSONE (DELTASONE) 50 MG tablet Take 1 tablet (50 mg total) by mouth daily with breakfast. 10 tablet 0 08/18/2015 at Unknown time  . rosuvastatin (CRESTOR) 40 MG tablet Take 40 mg by mouth daily.     08/18/2015 at Unknown time  . sulfaDIAZINE 500 MG tablet Take 1 tablet by mouth 2 (two) times daily. Only on weekends   Past Month at Unknown time  . sulfamethoxazole-trimethoprim (BACTRIM DS) 800-160 MG  per tablet Take 1 tablet by mouth 2 (two) times a week. Reported on 08/09/2015   08/18/2015 at Unknown time  . SYMBICORT 160-4.5 MCG/ACT inhaler Inhale 2 puffs into the lungs every 12 (twelve) hours.   11 08/18/2015 at Unknown time  . vitamin B-12 (CYANOCOBALAMIN) 100 MCG tablet Take 100 mcg by mouth daily.   08/18/2015 at Unknown time  . NITROSTAT 0.4 MG SL tablet DISSOLVE 1 TABLET UNDER TONGUE EVERY 5 MINUTES UP TO 15 MINUTES FOR CHEST PAIN. IF NO RELIEF CALL 911. 25 tablet 3 Taking   Scheduled: . budesonide-formoterol  2 puff Inhalation Q12H  . clotrimazole  10 mg Oral 5 X Daily  . cycloSPORINE  1 drop Both Eyes BID  . fluconazole (DIFLUCAN) IV  100 mg Intravenous Q24H  . hydrocortisone sod succinate (SOLU-CORTEF) inj  50 mg Intravenous Q12H  . isosorbide mononitrate  15 mg Oral Daily  . sodium chloride flush  3 mL Intravenous Q12H  . sodium chloride flush  3 mL Intravenous Q12H   Continuous: . sodium chloride 75 mL/hr (08/21/15 0604)   OJJ:[id number] chloride, acetaminophen, albuterol, albuterol, mupirocin ointment, ondansetron **OR** ondansetron (ZOFRAN) IV, sodium chloride flush  Assesment: She has encephalopathy and it's not really clear what that is from. She does not have any problems with nuchal rigidity or think it's unlikely that she has meningitis. She did not take any medications at home once she was discharged from the hospital so I don't think it's a medication effect. She does not have a stroke by CT. She does have significant peripheral arterial disease and has what looks like white matter abnormalities on CT. At baseline she has COPD which is severe and she was acutely hypoxic on admission but her oxygenation is better. She has a history of lymphoma. Principal Problem:   Encephalopathy Active Problems:   HYPERTENSION, BENIGN   CAD, NATIVE VESSEL   Vulvar cancer ([place])   HCAP (healthcare-associated pneumonia)   Acute respiratory failure with hypoxia ([facility])   COPD (chronic  obstructive pulmonary disease) ([facility])   Malnutrition of moderate degree    Plan: EEG. Neurology consultation. She may need MRI.    LOS: 3 days   Salomon Ganser L 08/21/2015, 10:38 AM

## 2015-08-22 ENCOUNTER — Ambulatory Visit (HOSPITAL_COMMUNITY): Payer: Medicare HMO | Admitting: Oncology

## 2015-08-22 ENCOUNTER — Inpatient Hospital Stay (HOSPITAL_COMMUNITY): Payer: Medicare HMO

## 2015-08-22 ENCOUNTER — Inpatient Hospital Stay (HOSPITAL_COMMUNITY)
Admit: 2015-08-22 | Discharge: 2015-08-22 | Disposition: A | Discharge status: Home / Self Care | Payer: Medicare HMO | Attending: Family Medicine | Admitting: Family Medicine

## 2015-08-22 MED ORDER — VITAMIN B-12 100 MCG PO TABS
100.0000 ug | ORAL_TABLET | Freq: Every day | ORAL | Status: DC
  Administered 2015-08-22 – 2015-08-23 (×2): 100 ug via ORAL
  Filled 2015-08-22 (×5): qty 1

## 2015-08-22 MED ORDER — ROSUVASTATIN CALCIUM 20 MG PO TABS
40.0000 mg | ORAL_TABLET | Freq: Every day | ORAL | Status: DC
  Administered 2015-08-22 – 2015-08-24 (×3): 40 mg via ORAL
  Filled 2015-08-22 (×3): qty 2

## 2015-08-22 MED ORDER — FLUTICASONE FUROATE-VILANTEROL 100-25 MCG/INH IN AEPB
1.0000 | INHALATION_SPRAY | Freq: Every day | RESPIRATORY_TRACT | Status: DC
  Filled 2015-08-22: qty 28

## 2015-08-22 MED ORDER — OXYCODONE HCL 5 MG PO TABS
10.0000 mg | ORAL_TABLET | ORAL | Status: DC | PRN
  Administered 2015-08-22 – 2015-08-24 (×5): 10 mg via ORAL
  Filled 2015-08-22 (×6): qty 2

## 2015-08-22 MED ORDER — CLOPIDOGREL BISULFATE 75 MG PO TABS
75.0000 mg | ORAL_TABLET | Freq: Every day | ORAL | Status: DC
  Administered 2015-08-22 – 2015-08-24 (×3): 75 mg via ORAL
  Filled 2015-08-22 (×3): qty 1

## 2015-08-22 MED ORDER — METOPROLOL TARTRATE 25 MG PO TABS
25.0000 mg | ORAL_TABLET | Freq: Two times a day (BID) | ORAL | Status: DC
  Administered 2015-08-22 – 2015-08-24 (×5): 25 mg via ORAL
  Filled 2015-08-22 (×5): qty 1

## 2015-08-22 MED ORDER — GADOBENATE DIMEGLUMINE 529 MG/ML IV SOLN
10.0000 mL | Freq: Once | INTRAVENOUS | Status: AC | PRN
  Administered 2015-08-22: 10 mL via INTRAVENOUS

## 2015-08-22 MED ORDER — PREDNISONE 20 MG PO TABS
50.0000 mg | ORAL_TABLET | Freq: Every day | ORAL | Status: DC
  Administered 2015-08-22 – 2015-08-24 (×3): 50 mg via ORAL
  Filled 2015-08-22 (×3): qty 2

## 2015-08-22 MED ORDER — SULFADIAZINE 500 MG PO TABS: 500.0000 mg | ORAL_TABLET | ORAL | Status: DC

## 2015-08-22 MED ORDER — BUPROPION HCL ER (XL) 150 MG PO TB24
150.0000 mg | ORAL_TABLET | Freq: Every day | ORAL | Status: DC
  Administered 2015-08-22 – 2015-08-23 (×2): 150 mg via ORAL
  Filled 2015-08-22 (×5): qty 1

## 2015-08-22 MED ORDER — ALPRAZOLAM 0.5 MG PO TABS
0.5000 mg | ORAL_TABLET | Freq: Three times a day (TID) | ORAL | Status: DC | PRN
  Administered 2015-08-22 – 2015-08-24 (×3): 0.5 mg via ORAL
  Filled 2015-08-22 (×3): qty 1

## 2015-08-22 MED ORDER — NITROGLYCERIN 0.4 MG SL SUBL: 0.4000 mg | SUBLINGUAL_TABLET | SUBLINGUAL | Status: DC | PRN

## 2015-08-22 NOTE — Procedures (Signed)
Tina Marsh A. Tina Laughter, MD     [email or web address]           HISTORY: The patient presents with confusion. The study done to evaluate for seizures as a cause for these confusional episodes.  MEDICATIONS: Scheduled Meds: . buPROPion  150 mg Oral Daily  . clopidogrel  75 mg Oral Daily  . clotrimazole  10 mg Oral 5 X Daily  . cycloSPORINE  1 drop Both Eyes BID  . fluconazole (DIFLUCAN) IV  100 mg Intravenous Q24H  . fluticasone furoate-vilanterol  1 puff Inhalation Daily  . hydrocortisone sod succinate (SOLU-CORTEF) inj  50 mg Intravenous Q12H  . isosorbide mononitrate  15 mg Oral Daily  . metoprolol  25 mg Oral BID  . predniSONE  50 mg Oral Q breakfast  . rosuvastatin  40 mg Oral Daily  . sodium chloride flush  3 mL Intravenous Q12H  . sodium chloride flush  3 mL Intravenous Q12H  . [START ON 08/27/2015] sulfaDIAZINE  500 mg Oral 2 times per day on Sun Sat  . vitamin B-12  100 mcg Oral Daily   Continuous Infusions: . sodium chloride 75 mL/hr at 08/22/15 0610   PRN Meds:.sodium chloride, acetaminophen (TYLENOL) oral liquid 160 mg/5 mL, acetaminophen, albuterol, albuterol, ALPRAZolam, mupirocin ointment, nitroGLYCERIN, ondansetron **OR** ondansetron (ZOFRAN) IV, oxyCODONE, sodium chloride flush  Prior to Admission medications   Medication Sig Start Date End Date Taking? Authorizing Provider  albuterol (PROVENTIL) (2.5 MG/3ML) 0.083% nebulizer solution Take 3 mLs (2.5 mg total) by nebulization every 4 (four) hours as needed for wheezing or shortness of breath. 08/17/15  Yes Lucia Gaskins, MD  albuterol (VENTOLIN HFA) 108 (90 BASE) MCG/ACT inhaler Inhale 2 puffs into the lungs every 6 (six) hours as needed for wheezing or shortness of breath (wheezing and shortness of breath). Reported on 08/09/2015   Yes Historical Provider, MD  ALPRAZolam Duanne Moron) 1 MG tablet Take 1 mg by mouth 3 (three) times daily as needed for anxiety or sleep (sleep).    Yes Historical Provider,  MD  amitriptyline (ELAVIL) 50 MG tablet Take 50 mg by mouth at bedtime.   Yes Historical Provider, MD  aspirin 81 MG EC tablet Take 81 mg by mouth daily.     Yes Historical Provider, MD  Biotin 1 MG CAPS Take 1 tablet by mouth daily. Reported on 08/09/2015   Yes Historical Provider, MD  BREO ELLIPTA 100-25 MCG/INH AEPB Take 1 Inhaler by mouth 2 (two) times daily. 07/06/15  Yes Historical Provider, MD  buPROPion (WELLBUTRIN XL) 150 MG 24 hr tablet Take 150 mg by mouth daily.   Yes Historical Provider, MD  clopidogrel (PLAVIX) 75 MG tablet TAKE 1 TABLET DAILY WITH BREAKFAST. 12/21/14  Yes Sherren Mocha, MD  cycloSPORINE (RESTASIS) 0.05 % ophthalmic emulsion Place 1 drop into both eyes 2 (two) times daily.   Yes Historical Provider, MD  diazepam (VALIUM) 5 MG tablet Take 5 mg by mouth every 6 (six) hours as needed for muscle spasms (muscle spasms). For muscle spasms   Yes Historical Provider, MD  furosemide (LASIX) 40 MG tablet Take 40 mg by mouth daily as needed for fluid (fluid).  05/19/13  Yes Historical Provider, MD  isosorbide mononitrate (IMDUR) 30 MG 24 hr tablet TAKE 1/2 TABLET BY MOUTH DAILY. 12/21/14  Yes Sherren Mocha, MD  metoprolol (LOPRESSOR) 50 MG tablet TAKE (1/2) TABLET BY MOUTH TWICE DAILY. 07/06/15  Yes Sherren Mocha, MD  mupirocin ointment (BACTROBAN) 2 % Apply 1 application topically daily as  needed (leg infection). Reported on 08/09/2015 03/13/12  Yes Historical Provider, MD  NEXIUM 40 MG capsule Take 40 mg by mouth daily.  12/29/11  Yes Historical Provider, MD  oxycodone (ROXICODONE) 30 MG immediate release tablet Take 30 mg by mouth every 4 (four) hours as needed for pain (pain). For breakthrough pain   Yes Historical Provider, MD  OXYCONTIN 80 MG 12 hr tablet Take 80 mg by mouth 4 (four) times daily as needed. 07/27/15  Yes Historical Provider, MD  predniSONE (DELTASONE) 50 MG tablet Take 1 tablet (50 mg total) by mouth daily with breakfast. 08/17/15  Yes Lucia Gaskins, MD    rosuvastatin (CRESTOR) 40 MG tablet Take 40 mg by mouth daily.     Yes Historical Provider, MD  sulfaDIAZINE 500 MG tablet Take 1 tablet by mouth 2 (two) times daily. Only on weekends   Yes Historical Provider, MD  sulfamethoxazole-trimethoprim (BACTRIM DS) 800-160 MG per tablet Take 1 tablet by mouth 2 (two) times a week. Reported on 08/09/2015   Yes Historical Provider, MD  SYMBICORT 160-4.5 MCG/ACT inhaler Inhale 2 puffs into the lungs every 12 (twelve) hours.  06/01/14  Yes Historical Provider, MD  vitamin B-12 (CYANOCOBALAMIN) 100 MCG tablet Take 100 mcg by mouth daily.   Yes Historical Provider, MD  NITROSTAT 0.4 MG SL tablet DISSOLVE 1 TABLET UNDER TONGUE EVERY 5 MINUTES UP TO 15 MINUTES FOR CHEST PAIN. IF NO RELIEF CALL 911. 08/27/14   Sherren Mocha, MD      ANALYSIS: A 16 channel recording using standard 10 20 measurements is conducted for 21 minutes. There is a well-formed posterior dominant rhythm of 9 Hz which attenuates with eye opening. There is beta activity are generally the frontal areas. Awake and drowsy activities are observed. Photic simulation and hyperventilation were not carried out. The patient had episodes of total body jerk and also a right sided jerk without clear electrographic correlates. There is no focal or lateral slowing. There are a couple episodes of sharp wave activity involving the left temporal region.    IMPRESSION: This recording of awake and drowsy states is slightly abnormal showing a couple of sharp wave activity involving the left temporal region which could correlate with partial onset seizures.      [name], M.D.  Diplomate, Tax adviser of Psychiatry and Neurology ( Neurology).

## 2015-08-22 NOTE — Evaluation (Signed)
Clinical/Bedside Swallow Evaluation Patient Details  Name: [name] MRN: [id number] Date of Birth: [birth year]  Today's Date: 08/22/2015 Time: SLP Start Time (ACUTE ONLY): 1125 SLP Stop Time (ACUTE ONLY): 1158 SLP Time Calculation (min) (ACUTE ONLY): 33 min  Past Medical History:  Past Medical History  Diagnosis Date  . Coronary artery disease     status post stenting of the aortic and iliac vessels by Dr. Gwenlyn Found. Repeat aortic stenting 2010  . Left ventricular dysfunction     hx of with ejection fraction of 35% range.  . Carotid artery disease (HCC)     hx of carotid cerebrovascular disease with 0-39% bilateral internal carotid artery setenosis   . Non Hodgkin's lymphoma ([place])   . Chronic back pain     status post lumbar surgery  . Hyperlipidemia   . Hypertension   . COPD (chronic obstructive pulmonary disease) ([place])   . Shortness of breath   . Cancer (HCC)     hx of non hogdin lymphoma  . CHF (congestive heart failure) ([place])   . Blood transfusion   . Arthritis   . Myocardial infarction ([place])   . Peripheral vascular disease ([place])   . GERD (gastroesophageal reflux disease)   . Anemia   . Osteoporosis   . Depression   . Anxiety   . Other malignant lymphomas, unspecified site, extranodal and solid organ sites 2004    Back  . Asthma   . Vulvar cancer ([place])   . Anginal pain ([place])     occ last time last wk  . Dysrhythmia     ?  . Stroke ([place]) 04  . Pneumonia     hx  . History of kidney stones   . Seizures ([place])     ? yrs ago  . Lymphocytic lymphoma ([place]) 12/24/2013  . Non-Hodgkin lymphoma, B-cell, low grade 12/24/2013  . Atherosclerosis of native arteries of the extremities with rest pain 12/10/2008    Qualifier: Diagnosis of  By: Owens Shark, RN, BSN, Lauren    . Langerhans cell histiocytosis of lung, 2009 11/10/2013  . PVD 10/27/2008    Qualifier: Diagnosis of  By: Owens Shark, RN, BSN, Lauren    . COPD 10/27/2008    Qualifier: Diagnosis of  By: Owens Shark, RN, BSN, Lauren      Past Surgical History:  Past Surgical History  Procedure Laterality Date  . [place] surg    . Back surgery      x 2  . Lung biopsy    . Cardiac catheterization    . Abdominal aortogram  08/08/2011  . Abdominal hysterectomy      partial  . Portacath placement      pt says its for chemo only and its an old port(04)   . Dilation and curettage of uterus    . Vulva /perineum biopsy  04/09/2012    Procedure: VULVAR BIOPSY;  Surgeon: Florian Buff, MD;  Location: [facility];  Service: Gynecology;  Laterality: N/A;  . Vulvectomy  05/20/2012    Procedure: VULVECTOMY;  Surgeon: Alvino Chapel, MD;  Location: [facility];  Service: Gynecology;  Laterality: Bilateral;  BILATERAL SIMPLE VULVECTOMIES  . Video assisted thoracoscopy (vats)/wedge resection Right 12/02/2013    Procedure: VIDEO ASSISTED THORACOSCOPY (VATS)/WEDGE RESECTION;  Surgeon: Melrose Nakayama, MD;  Location: [facility];  Service: Thoracic;  Laterality: Right;  . Abdominal aortagram N/A 08/08/2011    Procedure: ABDOMINAL Maxcine Ham;  Surgeon: Sherren Mocha, MD;  Location: [facility] CATH LAB;  Service: Cardiovascular;  Laterality: N/A;  . Percutaneous stent intervention Bilateral 08/08/2011    Procedure: PERCUTANEOUS STENT INTERVENTION;  Surgeon: Sherren Mocha, MD;  Location: [facility] CATH LAB;  Service: Cardiovascular;  Laterality: Bilateral;   HPI:  She was admitted with acute encephalopathy of unknown cause. Although she is on a number of medications that could cause her to have altered mental status according to family who checked her medication bottle she did not actually take anything after she got home from the hospital. No definite seizure but that's a possibility. Her CT was okay and did not show a stroke but I have ordered MRI. [name] ordered a swallowing evaluation due to pt report of odynophagia and coughing with po intake.   Assessment / Plan / Recommendation Clinical Impression  [name] reports a dramatic improvement in swallowing  abilities. She states that she thinks it was due to not taking her reflux medication and/or thrush. Pt being treated for thrush and oral cavity is clear at this time. She reports that she previously took her PPI BID, but was changed to once per day at the hospital. Pt describes a "burning" sensation in pharynx over the weekend, but is no longer present. Oral motor examination was WNL and pt observed with lunch meal of regular textures and thin liquids. She shows no overt signs or symptoms of aspiration with textures and consistencies presented. Pt reports being in bed a lot at home and she was encouraged to consume all meals in upright position and remain upright for 30-60 minutes after. No further SLP services indicated at this time. SLP will sign off.     Aspiration Risk  No limitations    Diet Recommendation Regular;Thin liquid (regular textures as tolerated with dental status)   Liquid Administration via: Cup;Straw Medication Administration: Whole meds with liquid Supervision: Patient able to self feed Postural Changes: Seated upright at 90 degrees;Remain upright for at least 30 minutes after po intake    Other  Recommendations Oral Care Recommendations: Oral care BID;Patient independent with oral care Other Recommendations: Clarify dietary restrictions   Follow up Recommendations  None    Frequency and Duration            Prognosis Prognosis for Safe Diet Advancement: Good      Swallow Study   General Date of Onset: 08/18/15 HPI: She was admitted with acute encephalopathy of unknown cause. Although she is on a number of medications that could cause her to have altered mental status according to family who checked her medication bottle she did not actually take anything after she got home from the hospital. No definite seizure but that's a possibility. Her CT was okay and did not show a stroke but I have ordered MRI. Type of Study: Bedside Swallow Evaluation Diet Prior to this  Study: Regular;Thin liquids Temperature Spikes Noted: No Respiratory Status: Nasal cannula History of Recent Intubation: No Behavior/Cognition: Alert;Cooperative;Pleasant mood Oral Cavity Assessment: Within Functional Limits Oral Care Completed by SLP: No Oral Cavity - Dentition: Poor condition Vision: Functional for self-feeding Self-Feeding Abilities: Able to feed self Patient Positioning: Upright in bed Baseline Vocal Quality: Normal (raspy) Volitional Cough: Strong;Congested Volitional Swallow: Able to elicit    Oral/Motor/Sensory Function Overall Oral Motor/Sensory Function: Within functional limits   Ice Chips Ice chips: Not tested   Thin Liquid Thin Liquid: Within functional limits Presentation: Cup;Self Fed;Straw    Nectar Thick Nectar Thick Liquid: Not tested   Honey Thick Honey Thick Liquid: Not tested   Puree Puree: Within  functional limits Presentation: Self Fed;Spoon   Solid   Thank you,  Genene Churn, CCC-SLP [phone]    Solid: Within functional limits Presentation: Self Fed Other Comments: Poor dentition, but able to masticate soft solids        Euclid Cassetta 08/22/2015,12:16 PM

## 2015-08-22 NOTE — Progress Notes (Signed)
Subjective: [name] much more alert. [name] complaining of pain now. We still don't have a definitive cause of her problem  Objective: Vital signs in last 24 hours: Temp:  [98 F (36.7 C)-98.8 F (37.1 C)] 98.1 F (36.7 C) (02/20 0548) Pulse Rate:  [66-81] 81 (02/20 0548) Resp:  [18-20] 20 (02/20 0548) BP: (105-142)/(62-64) 142/64 mmHg (02/20 0548) SpO2:  [94 %-96 %] 96 % (02/20 0548) Weight:  [53.479 kg (117 lb 14.4 oz)] 53.479 kg (117 lb 14.4 oz) (02/20 0548) Weight change: -0.091 kg (-3.2 oz) Last BM Date: 08/21/15  Intake/Output from previous day: 02/19 0701 - 02/20 0700 In: 1012.5 [P.O.:80; I.V.:882.5; IV Piggyback:50] Out: 200 [Urine:200]  PHYSICAL EXAM General appearance: alert, cooperative and mild distress Resp: rhonchi bilaterally Cardio: regular rate and rhythm, S1, S2 normal, no murmur, click, rub or gallop GI: soft, non-tender; bowel sounds normal; no masses,  no organomegaly Extremities: extremities normal, atraumatic, no cyanosis or edema  Lab Results:  Results for orders placed or performed during the hospital encounter of 08/18/15 (from the past 48 hour(s))  CBC with Differential/Platelet     Status: Abnormal   Collection Time: 08/20/15 12:32 PM  Result Value Ref Range   WBC 13.1 (H) 4.0 - 10.5 K/uL   RBC 4.34 3.87 - 5.11 MIL/uL   Hemoglobin 13.0 12.0 - 15.0 g/dL   HCT 39.0 36.0 - 46.0 %   MCV 89.9 78.0 - 100.0 fL   MCH 30.0 26.0 - 34.0 pg   MCHC 33.3 30.0 - 36.0 g/dL   RDW 16.0 (H) 11.5 - 15.5 %   Platelets 151 150 - 400 K/uL   Neutrophils Relative % 94 %   Neutro Abs 12.4 (H) 1.7 - 7.7 K/uL   Lymphocytes Relative 2 %   Lymphs Abs 0.3 (L) 0.7 - 4.0 K/uL   Monocytes Relative 3 %   Monocytes Absolute 0.3 0.1 - 1.0 K/uL   Eosinophils Relative 1 %   Eosinophils Absolute 0.1 0.0 - 0.7 K/uL   Basophils Relative 0 %   Basophils Absolute 0.0 0.0 - 0.1 K/uL  Comprehensive metabolic panel     Status: Abnormal   Collection Time: 08/20/15 12:32 PM  Result  Value Ref Range   Sodium 140 135 - 145 mmol/L   Potassium 3.8 3.5 - 5.1 mmol/L   Chloride 99 (L) 101 - 111 mmol/L   CO2 30 22 - 32 mmol/L   Glucose, Bld 131 (H) 65 - 99 mg/dL   BUN 23 (H) 6 - 20 mg/dL   Creatinine, Ser 1.14 (H) 0.44 - 1.00 mg/dL   Calcium 8.3 (L) 8.9 - 10.3 mg/dL   Total Protein 5.9 (L) 6.5 - 8.1 g/dL   Albumin 2.9 (L) 3.5 - 5.0 g/dL   AST 21 15 - 41 U/L   ALT 25 14 - 54 U/L   Alkaline Phosphatase 85 38 - 126 U/L   Total Bilirubin 0.7 0.3 - 1.2 mg/dL   GFR calc non Af Amer 52 (L) >60 mL/min   GFR calc Af Amer >60 >60 mL/min    Comment: (NOTE) The eGFR has been calculated using the CKD EPI equation. This calculation has not been validated in all clinical situations. eGFR's persistently <60 mL/min signify possible Chronic Kidney Disease.    Anion gap 11 5 - 15  CBC with Differential/Platelet     Status: Abnormal   Collection Time: 08/21/15  7:19 AM  Result Value Ref Range   WBC 7.1 4.0 - 10.5 K/uL  RBC 4.22 3.87 - 5.11 MIL/uL   Hemoglobin 12.6 12.0 - 15.0 g/dL   HCT 37.6 36.0 - 46.0 %   MCV 89.1 78.0 - 100.0 fL   MCH 29.9 26.0 - 34.0 pg   MCHC 33.5 30.0 - 36.0 g/dL   RDW 15.9 (H) 11.5 - 15.5 %   Platelets 155 150 - 400 K/uL   Neutrophils Relative % 84 %   Neutro Abs 6.0 1.7 - 7.7 K/uL   Lymphocytes Relative 11 %   Lymphs Abs 0.8 0.7 - 4.0 K/uL   Monocytes Relative 4 %   Monocytes Absolute 0.3 0.1 - 1.0 K/uL   Eosinophils Relative 1 %   Eosinophils Absolute 0.1 0.0 - 0.7 K/uL   Basophils Relative 0 %   Basophils Absolute 0.0 0.0 - 0.1 K/uL  Basic metabolic panel     Status: Abnormal   Collection Time: 08/21/15  7:19 AM  Result Value Ref Range   Sodium 141 135 - 145 mmol/L   Potassium 3.5 3.5 - 5.1 mmol/L   Chloride 104 101 - 111 mmol/L   CO2 28 22 - 32 mmol/L   Glucose, Bld 92 65 - 99 mg/dL   BUN 22 (H) 6 - 20 mg/dL   Creatinine, Ser 1.04 (H) 0.44 - 1.00 mg/dL   Calcium 8.3 (L) 8.9 - 10.3 mg/dL   GFR calc non Af Amer 58 (L) >60 mL/min   GFR  calc Af Amer >60 >60 mL/min    Comment: (NOTE) The eGFR has been calculated using the CKD EPI equation. This calculation has not been validated in all clinical situations. eGFR's persistently <60 mL/min signify possible Chronic Kidney Disease.    Anion gap 9 5 - 15    ABGS  Recent Labs  08/19/15 1200  PHART 7.463*  PO2ART 64.1*  HCO3 31.2*   CULTURES Recent Results (from the past 240 hour(s))  Blood culture (routine x 2)     Status: None (Preliminary result)   Collection Time: 08/18/15  3:55 PM  Result Value Ref Range Status   Specimen Description BLOOD RIGHT HAND  Final   Special Requests BOTTLES DRAWN AEROBIC ONLY 6CC  Final   Culture NO GROWTH 3 DAYS  Final   Report Status PENDING  Incomplete  Blood culture (routine x 2)     Status: None (Preliminary result)   Collection Time: 08/18/15  4:15 PM  Result Value Ref Range Status   Specimen Description BLOOD LEFT ANTECUBITAL DRAWN BY RN  Final   Special Requests BOTTLES DRAWN AEROBIC AND ANAEROBIC 5CC EACH  Final   Culture NO GROWTH 3 DAYS  Final   Report Status PENDING  Incomplete   Studies/Results: Ct Head Wo Contrast  08/20/2015  CLINICAL DATA:  Encephalopathy acute. Hx of stroke,seizures, non Hodgkins lymphoma, vulvar cancer. EXAM: CT HEAD WITHOUT CONTRAST TECHNIQUE: Contiguous axial images were obtained from the base of the skull through the vertex without intravenous contrast. COMPARISON:  10/08/2008 FINDINGS: Ventricles are normal in size and configuration. There are no parenchymal masses or mass effect. There is no evidence of a cortical infarct. There are minor areas of subcortical white matter hypoattenuation consistent with chronic microvascular ischemic change. There are no extra-axial masses or abnormal fluid collections. There is no intracranial hemorrhage. There is significant sinus disease. The visualized maxillary sinuses are opacified. Right sphenoid sinus is mostly opacified with dependent fluid. Left sphenoid  sinus is opacified. Most of the ethmoid air cells are opacified. Mastoid air cells and middle ear cavities  are clear. IMPRESSION: 1. No acute intracranial abnormalities. 2. Significant sinus disease as detailed above including an air-fluid level in the right sphenoid sinus. Electronically Signed   By: Lajean Manes M.D.   On: 08/20/2015 14:00    Medications:  Prior to Admission:  Prescriptions prior to admission  Medication Sig Dispense Refill Last Dose  . albuterol (PROVENTIL) (2.5 MG/3ML) 0.083% nebulizer solution Take 3 mLs (2.5 mg total) by nebulization every 4 (four) hours as needed for wheezing or shortness of breath. 75 mL 12 08/18/2015 at Unknown time  . albuterol (VENTOLIN HFA) 108 (90 BASE) MCG/ACT inhaler Inhale 2 puffs into the lungs every 6 (six) hours as needed for wheezing or shortness of breath (wheezing and shortness of breath). Reported on 08/09/2015   08/18/2015 at Unknown time  . ALPRAZolam (XANAX) 1 MG tablet Take 1 mg by mouth 3 (three) times daily as needed for anxiety or sleep (sleep).    08/17/2015 at Unknown time  . amitriptyline (ELAVIL) 50 MG tablet Take 50 mg by mouth at bedtime.   08/17/2015 at Unknown time  . aspirin 81 MG EC tablet Take 81 mg by mouth daily.     08/18/2015 at Unknown time  . Biotin 1 MG CAPS Take 1 tablet by mouth daily. Reported on 08/09/2015   08/18/2015 at Unknown time  . BREO ELLIPTA 100-25 MCG/INH AEPB Take 1 Inhaler by mouth 2 (two) times daily.   08/18/2015 at Unknown time  . buPROPion (WELLBUTRIN XL) 150 MG 24 hr tablet Take 150 mg by mouth daily.   08/18/2015 at Unknown time  . clopidogrel (PLAVIX) 75 MG tablet TAKE 1 TABLET DAILY WITH BREAKFAST. 30 tablet 6 08/18/2015 at Unknown time  . cycloSPORINE (RESTASIS) 0.05 % ophthalmic emulsion Place 1 drop into both eyes 2 (two) times daily.   08/18/2015 at Unknown time  . diazepam (VALIUM) 5 MG tablet Take 5 mg by mouth every 6 (six) hours as needed for muscle spasms (muscle spasms). For muscle spasms    08/18/2015 at Unknown time  . furosemide (LASIX) 40 MG tablet Take 40 mg by mouth daily as needed for fluid (fluid).    08/18/2015 at Unknown time  . isosorbide mononitrate (IMDUR) 30 MG 24 hr tablet TAKE 1/2 TABLET BY MOUTH DAILY. 45 tablet 6 08/18/2015 at Unknown time  . metoprolol (LOPRESSOR) 50 MG tablet TAKE (1/2) TABLET BY MOUTH TWICE DAILY. 30 tablet 10 08/18/2015 at Unknown time  . mupirocin ointment (BACTROBAN) 2 % Apply 1 application topically daily as needed (leg infection). Reported on 08/09/2015   08/18/2015 at Unknown time  . NEXIUM 40 MG capsule Take 40 mg by mouth daily.    08/18/2015 at Unknown time  . oxycodone (ROXICODONE) 30 MG immediate release tablet Take 30 mg by mouth every 4 (four) hours as needed for pain (pain). For breakthrough pain   08/18/2015 at Unknown time  . OXYCONTIN 80 MG 12 hr tablet Take 80 mg by mouth 4 (four) times daily as needed.   08/18/2015 at Unknown time  . predniSONE (DELTASONE) 50 MG tablet Take 1 tablet (50 mg total) by mouth daily with breakfast. 10 tablet 0 08/18/2015 at Unknown time  . rosuvastatin (CRESTOR) 40 MG tablet Take 40 mg by mouth daily.     08/18/2015 at Unknown time  . sulfaDIAZINE 500 MG tablet Take 1 tablet by mouth 2 (two) times daily. Only on weekends   Past Month at Unknown time  . sulfamethoxazole-trimethoprim (BACTRIM DS) 800-160 MG per tablet Take  1 tablet by mouth 2 (two) times a week. Reported on 08/09/2015   08/18/2015 at Unknown time  . SYMBICORT 160-4.5 MCG/ACT inhaler Inhale 2 puffs into the lungs every 12 (twelve) hours.   11 08/18/2015 at Unknown time  . vitamin B-12 (CYANOCOBALAMIN) 100 MCG tablet Take 100 mcg by mouth daily.   08/18/2015 at Unknown time  . NITROSTAT 0.4 MG SL tablet DISSOLVE 1 TABLET UNDER TONGUE EVERY 5 MINUTES UP TO 15 MINUTES FOR CHEST PAIN. IF NO RELIEF CALL 911. 25 tablet 3 Taking   Scheduled: . buPROPion  150 mg Oral Daily  . clopidogrel  75 mg Oral Daily  . clotrimazole  10 mg Oral 5 X Daily  . cycloSPORINE   1 drop Both Eyes BID  . fluconazole (DIFLUCAN) IV  100 mg Intravenous Q24H  . fluticasone furoate-vilanterol  1 puff Inhalation Daily  . hydrocortisone sod succinate (SOLU-CORTEF) inj  50 mg Intravenous Q12H  . isosorbide mononitrate  15 mg Oral Daily  . metoprolol  25 mg Oral BID  . predniSONE  50 mg Oral Q breakfast  . rosuvastatin  40 mg Oral Daily  . sodium chloride flush  3 mL Intravenous Q12H  . sodium chloride flush  3 mL Intravenous Q12H  . [START ON 08/27/2015] sulfaDIAZINE  500 mg Oral 2 times per day on Sun Sat  . vitamin B-12  100 mcg Oral Daily   Continuous: . sodium chloride 75 mL/hr at 08/22/15 0610   OQH:UTMLYY chloride, acetaminophen (TYLENOL) oral liquid 160 mg/5 mL, acetaminophen, albuterol, albuterol, ALPRAZolam, mupirocin ointment, nitroGLYCERIN, ondansetron **OR** ondansetron (ZOFRAN) IV, oxyCODONE, sodium chloride flush  Assesment: She was admitted with acute encephalopathy of unknown cause. Although she is on a number of medications that could cause her to have altered mental status according to family who checked her medication bottle she did not actually take anything after she got home from the hospital. No definite seizure but that's a possibility. Her CT was okay and did not show a stroke but I have ordered MRI. Principal Problem:   Encephalopathy Active Problems:   HYPERTENSION, BENIGN   CAD, NATIVE VESSEL   Vulvar cancer ([place])   HCAP (healthcare-associated pneumonia)   Acute respiratory failure with hypoxia (HCC)   COPD (chronic obstructive pulmonary disease) (HCC)   Malnutrition of moderate degree    Plan: I'm going to cautiously start her back on some of her medications. She understands that we need to reduce the doses. Neurology consultation and EEG will be obtained. MRI will be obtained    LOS: 4 days   Donaven Criswell L 08/22/2015, 8:43 AM

## 2015-08-22 NOTE — Consult Note (Signed)
[name]  [place] Tina. Merlene Laughter, MD     [email or web address]          AMAURI Yu is an 60 y.o. female.   ASSESSMENT/PLAN: 1. Agitated confusion/encephalopathy most likely due to hypoxia and hypercapnia. Medication effect is also concerning especially the combination of benzodiazepine Xanax and high-dose oxycodone. This combination increases risk of respiratory events an overdose.  2. Chronic pain syndrome. 3. Slightly abnormal EEG but I would not recommend treating with seizure medications.   The patient is Tina 60 year old white female who presents to the hospital with agitation and confusion. She was noted to be hypoxic with oxygen saturation in the low 80s. The patient was actually recently discharged from the hospital with respiratory problems. She was to be an oxygen at home but it appears there were issues given home oxygen. It appears patient was treated for community-acquired pneumonia. The patient is aware that she was confused and reports that she didn't feel really bad with Tina pneumonia. She does not report taking more of her medications but admits that she is on high-dose Xanax and also oxycodone. Patient complains of chronic back pain. She also reports having pain in her feet apparently she has Tina diagnosis of neuropathy. She otherwise reports feeling well at this time. [name] mostly amnestic to the initial presentation associated with the confusion. The review of systems is otherwise negative.  GENERAL: This very pleasant thin female in no acute distress.  HEENT: Supple. Atraumatic normocephalic.   ABDOMEN: soft  EXTREMITIES: No edema.    BACK: Normal.  SKIN: Extensive chronic ecchymotic spots involving the upper extremities.    MENTAL STATUS: Alert and oriented. Speech, language and cognition are generally intact. Judgment and insight normal.   CRANIAL NERVES: Pupils are equal, round and reactive to light and accommodation; extra ocular movements are full,  there is no significant nystagmus; visual fields are full; upper and lower facial muscles are normal in strength and symmetric, there is no flattening of the nasolabial folds; tongue is midline; uvula is midline; shoulder elevation is normal.  MOTOR: Normal tone, bulk and strength; no pronator drift.  COORDINATION: Left finger to nose is normal, right finger to nose is normal, No rest tremor; no intention tremor; no postural tremor; no bradykinesia.  REFLEXES: Deep tendon reflexes are symmetrical and normal. Babinski reflexes are flexor bilaterally.   SENSATION: Normal to light touch.     [[[[[[[[[[[[H/P 60 yo female h/o COPD, CAD, chronic pain d/c yesterday from hospital (no discharge summary) for HCAP.  Pt reports she was sent home in an ambulance, and no oxygen was arranged for her at home.  Difficult to ascertain the details of the discharge plan but it appears she did not qualify for home oxygen.  She lives alone.  Family went to check on her today and she was confused.  On arrival to ED her oxgyen sats were mid 74s.  Unknown what medications she was sent home on, and if she has taken any today.  Cannot obtain much reliable history from patient.  Referred for admission for hypoxia and confusion.]]]]]]]]]   HEAD CT FINDINGS: Ventricles are normal in size and configuration.   There are no parenchymal masses or mass effect. There is no evidence of Tina cortical infarct. There are minor areas of subcortical white matter hypoattenuation consistent with chronic microvascular ischemic change.   There are no extra-axial masses or abnormal fluid collections.   There is no intracranial hemorrhage.   There is significant sinus  disease. The visualized maxillary sinuses are opacified. Right sphenoid sinus is mostly opacified with dependent fluid. Left sphenoid sinus is opacified. Most of the ethmoid air cells are opacified.   Mastoid air cells and middle ear cavities are clear.  BRAIN  MRI 1. No acute intracranial finding. 2. Advanced sinusitis with acute on chronic features. 3. Moderate for age white matter disease, similar to 2010 and likely chronic microvascular ischemia. 4. Bilateral chronic mastoiditis.  Blood pressure 124/53, pulse 64, temperature 98.3 F (36.8 C), temperature source Oral, resp. rate 20, height _0  (1.651 m), weight 53.479 kg (117 lb 14.4 oz), SpO2 96 %.  Past Medical History  Diagnosis Date  . Coronary artery disease     status post stenting of the aortic and iliac vessels by Dr. Gwenlyn Found. Repeat aortic stenting 2010  . Left ventricular dysfunction     hx of with ejection fraction of 35% range.  . Carotid artery disease (HCC)     hx of carotid cerebrovascular disease with 0-39% bilateral internal carotid artery setenosis   . Non Hodgkin's lymphoma ([place])   . Chronic back pain     status post lumbar surgery  . Hyperlipidemia   . Hypertension   . COPD (chronic obstructive pulmonary disease) ([place])   . Shortness of breath   . Cancer (HCC)     hx of non hogdin lymphoma  . CHF (congestive heart failure) ([place])   . Blood transfusion   . Arthritis   . Myocardial infarction ([place])   . Peripheral vascular disease ([place])   . GERD (gastroesophageal reflux disease)   . Anemia   . Osteoporosis   . Depression   . Anxiety   . Other malignant lymphomas, unspecified site, extranodal and solid organ sites 2004    Back  . Asthma   . Vulvar cancer ([place])   . Anginal pain ([place])     occ last time last wk  . Dysrhythmia     ?  . Stroke ([place]) 04  . Pneumonia     hx  . History of kidney stones   . Seizures ([place])     ? yrs ago  . Lymphocytic lymphoma ([place]) 12/24/2013  . Non-Hodgkin lymphoma, B-cell, low grade 12/24/2013  . Atherosclerosis of native arteries of the extremities with rest pain 12/10/2008    Qualifier: Diagnosis of  By: Owens Shark, RN, BSN, Lauren    . Langerhans cell histiocytosis of lung, 2009 11/10/2013  . PVD 10/27/2008    Qualifier: Diagnosis  of  By: Owens Shark, RN, BSN, Lauren    . COPD 10/27/2008    Qualifier: Diagnosis of  By: Owens Shark RN, BSN, Lauren      Past Surgical History  Procedure Laterality Date  . [place] surg    . Back surgery      x 2  . Lung biopsy    . Cardiac catheterization    . Abdominal aortogram  08/08/2011  . Abdominal hysterectomy      partial  . Portacath placement      pt says its for chemo only and its an old port(04)   . Dilation and curettage of uterus    . Vulva /perineum biopsy  04/09/2012    Procedure: VULVAR BIOPSY;  Surgeon: Florian Buff, MD;  Location: [facility];  Service: Gynecology;  Laterality: N/Tina;  . Vulvectomy  05/20/2012    Procedure: VULVECTOMY;  Surgeon: Alvino Chapel, MD;  Location: [facility];  Service: Gynecology;  Laterality: Bilateral;  BILATERAL SIMPLE VULVECTOMIES  .  Video assisted thoracoscopy (vats)/wedge resection Right 12/02/2013    Procedure: VIDEO ASSISTED THORACOSCOPY (VATS)/WEDGE RESECTION;  Surgeon: Melrose Nakayama, MD;  Location: [facility];  Service: Thoracic;  Laterality: Right;  . Abdominal aortagram N/Tina 08/08/2011    Procedure: ABDOMINAL Maxcine Ham;  Surgeon: Sherren Mocha, MD;  Location[facility] C[facility] LAB;  Service: Cardiovascular;  Laterality: N/Tina;  . Percutaneous stent intervention Bilateral 08/08/2011    Procedure: PERCUTANEOUS STENT INTERVENTION;  Surgeon: Sherren Mocha, MD;  Location: [facility] CATH LAB;  Service: Cardiovascular;  Laterality: Bilateral;    Family History  Problem Relation Age of Onset  . Coronary artery disease      positive for vascular and cardiac disease  . Heart disease Mother   . Lung cancer Mother   . Heart attack Mother   . Heart failure Mother   . Heart disease Father   . Kidney cancer Father   . Heart attack Father   . Heart failure Father   . Diabetes Sister   . Aortic aneurysm Sister   . Heart attack Brother   . Asthma Son   . Sleep apnea Son   . Endometriosis Daughter     Social History:  reports that she has been smoking  Cigarettes.  She has Tina 100 pack-year smoking history. She has never used smokeless tobacco. She reports that she does not drink alcohol or use illicit drugs.  Allergies:  Allergies  Allergen Reactions  . Morphine And Related Other (See Comments)    Unknown reaction   . Naproxen     Made me feel "loopy" in the head    Medications: Prior to Admission medications   Medication Sig Start Date End Date Taking? Authorizing Provider  albuterol (PROVENTIL) (2.5 MG/3ML) 0.083% nebulizer solution Take 3 mLs (2.5 mg total) by nebulization every 4 (four) hours as needed for wheezing or shortness of breath. 08/17/15  Yes Lucia Gaskins, MD  albuterol (VENTOLIN HFA) 108 (90 BASE) MCG/ACT inhaler Inhale 2 puffs into the lungs every 6 (six) hours as needed for wheezing or shortness of breath (wheezing and shortness of breath). Reported on 08/09/2015   Yes Historical Provider, MD  ALPRAZolam Duanne Moron) 1 MG tablet Take 1 mg by mouth 3 (three) times daily as needed for anxiety or sleep (sleep).    Yes Historical Provider, MD  amitriptyline (ELAVIL) 50 MG tablet Take 50 mg by mouth at bedtime.   Yes Historical Provider, MD  aspirin 81 MG EC tablet Take 81 mg by mouth daily.     Yes Historical Provider, MD  Biotin 1 MG CAPS Take 1 tablet by mouth daily. Reported on 08/09/2015   Yes Historical Provider, MD  BREO ELLIPTA 100-25 MCG/INH AEPB Take 1 Inhaler by mouth 2 (two) times daily. 07/06/15  Yes Historical Provider, MD  buPROPion (WELLBUTRIN XL) 150 MG 24 hr tablet Take 150 mg by mouth daily.   Yes Historical Provider, MD  clopidogrel (PLAVIX) 75 MG tablet TAKE 1 TABLET DAILY WITH BREAKFAST. 12/21/14  Yes Sherren Mocha, MD  cycloSPORINE (RESTASIS) 0.05 % ophthalmic emulsion Place 1 drop into both eyes 2 (two) times daily.   Yes Historical Provider, MD  diazepam (VALIUM) 5 MG tablet Take 5 mg by mouth every 6 (six) hours as needed for muscle spasms (muscle spasms). For muscle spasms   Yes Historical Provider, MD   furosemide (LASIX) 40 MG tablet Take 40 mg by mouth daily as needed for fluid (fluid).  05/19/13  Yes Historical Provider, MD  isosorbide mononitrate (IMDUR) 30 MG  24 hr tablet TAKE 1/2 TABLET BY MOUTH DAILY. 12/21/14  Yes Sherren Mocha, MD  metoprolol (LOPRESSOR) 50 MG tablet TAKE (1/2) TABLET BY MOUTH TWICE DAILY. 07/06/15  Yes Sherren Mocha, MD  mupirocin ointment (BACTROBAN) 2 % Apply 1 application topically daily as needed (leg infection). Reported on 08/09/2015 03/13/12  Yes Historical Provider, MD  NEXIUM 40 MG capsule Take 40 mg by mouth daily.  12/29/11  Yes Historical Provider, MD  oxycodone (ROXICODONE) 30 MG immediate release tablet Take 30 mg by mouth every 4 (four) hours as needed for pain (pain). For breakthrough pain   Yes Historical Provider, MD  OXYCONTIN 80 MG 12 hr tablet Take 80 mg by mouth 4 (four) times daily as needed. 07/27/15  Yes Historical Provider, MD  predniSONE (DELTASONE) 50 MG tablet Take 1 tablet (50 mg total) by mouth daily with breakfast. 08/17/15  Yes Lucia Gaskins, MD  rosuvastatin (CRESTOR) 40 MG tablet Take 40 mg by mouth daily.     Yes Historical Provider, MD  sulfaDIAZINE 500 MG tablet Take 1 tablet by mouth 2 (two) times daily. Only on weekends   Yes Historical Provider, MD  sulfamethoxazole-trimethoprim (BACTRIM DS) 800-160 MG per tablet Take 1 tablet by mouth 2 (two) times Tina week. Reported on 08/09/2015   Yes Historical Provider, MD  SYMBICORT 160-4.5 MCG/ACT inhaler Inhale 2 puffs into the lungs every 12 (twelve) hours.  06/01/14  Yes Historical Provider, MD  vitamin B-12 (CYANOCOBALAMIN) 100 MCG tablet Take 100 mcg by mouth daily.   Yes Historical Provider, MD  NITROSTAT 0.4 MG SL tablet DISSOLVE 1 TABLET UNDER TONGUE EVERY 5 MINUTES UP TO 15 MINUTES FOR CHEST PAIN. IF NO RELIEF CALL 911. 08/27/14   Sherren Mocha, MD    Scheduled Meds: . buPROPion  150 mg Oral Daily  . clopidogrel  75 mg Oral Daily  . clotrimazole  10 mg Oral 5 X Daily  . cycloSPORINE  1  drop Both Eyes BID  . fluconazole (DIFLUCAN) IV  100 mg Intravenous Q24H  . fluticasone furoate-vilanterol  1 puff Inhalation Daily  . hydrocortisone sod succinate (SOLU-CORTEF) inj  50 mg Intravenous Q12H  . isosorbide mononitrate  15 mg Oral Daily  . metoprolol  25 mg Oral BID  . predniSONE  50 mg Oral Q breakfast  . rosuvastatin  40 mg Oral Daily  . sodium chloride flush  3 mL Intravenous Q12H  . sodium chloride flush  3 mL Intravenous Q12H  . [START ON 08/27/2015] sulfaDIAZINE  500 mg Oral 2 times per day on Sun Sat  . vitamin B-12  100 mcg Oral Daily   Continuous Infusions: . sodium chloride 75 mL/hr at 08/22/15 0610   PRN Meds:.sodium chloride, acetaminophen (TYLENOL) oral liquid 160 mg/5 mL, acetaminophen, albuterol, albuterol, ALPRAZolam, mupirocin ointment, nitroGLYCERIN, ondansetron **OR** ondansetron (ZOFRAN) IV, oxyCODONE, sodium chloride flush     Results for orders placed or performed during the hospital encounter of 08/18/15 (from the past 48 hour(s))  CBC with Differential/Platelet     Status: Abnormal   Collection Time: 08/21/15  7:19 AM  Result Value Ref Range   WBC 7.1 4.0 - 10.5 K/uL   RBC 4.22 3.87 - 5.11 MIL/uL   Hemoglobin 12.6 12.0 - 15.0 g/dL   HCT 37.6 36.0 - 46.0 %   MCV 89.1 78.0 - 100.0 fL   MCH 29.9 26.0 - 34.0 pg   MCHC 33.5 30.0 - 36.0 g/dL   RDW 15.9 (H) 11.5 - 15.5 %   Platelets  155 150 - 400 K/uL   Neutrophils Relative % 84 %   Neutro Abs 6.0 1.7 - 7.7 K/uL   Lymphocytes Relative 11 %   Lymphs Abs 0.8 0.7 - 4.0 K/uL   Monocytes Relative 4 %   Monocytes Absolute 0.3 0.1 - 1.0 K/uL   Eosinophils Relative 1 %   Eosinophils Absolute 0.1 0.0 - 0.7 K/uL   Basophils Relative 0 %   Basophils Absolute 0.0 0.0 - 0.1 K/uL  Basic metabolic panel     Status: Abnormal   Collection Time: 08/21/15  7:19 AM  Result Value Ref Range   Sodium 141 135 - 145 mmol/L   Potassium 3.5 3.5 - 5.1 mmol/L   Chloride 104 101 - 111 mmol/L   CO2 28 22 - 32 mmol/L    Glucose, Bld 92 65 - 99 mg/dL   BUN 22 (H) 6 - 20 mg/dL   Creatinine, Ser 1.04 (H) 0.44 - 1.00 mg/dL   Calcium 8.3 (L) 8.9 - 10.3 mg/dL   GFR calc non Af Amer 58 (L) >60 mL/min   GFR calc Af Amer >60 >60 mL/min    Comment: (NOTE) The eGFR has been calculated using the CKD EPI equation. This calculation has not been validated in all clinical situations. eGFR's persistently <60 mL/min signify possible Chronic Kidney Disease.    Anion gap 9 5 - 15    Studies/Results:     Juanito Gonyer Tina. Merlene Laughter, M.D.  Diplomate, Tax adviser of Psychiatry and Neurology ( Neurology). 08/22/2015, 5:35 PM

## 2015-08-22 NOTE — Evaluation (Signed)
Physical Therapy Evaluation Patient Details Name: [name] MRN: [id number] DOB: 1955-11-26 Today's Date: 08/22/2015   History of Present Illness  60yo white female who comes to [facility] after worsening SOB and hypoxia noticed during a chemo treatment here at [facility] oncology and cancer center. Pt is currently receiving 2.5 L O2 nasal canula. At baseline pt is fully Indep.   Clinical Impression  Pt was pleasant and in no acute distress upon PT arrival, A&Ox3. She reports 10/10 pain of the low back and B feet, noting her nurse was on the way with pain meds. Grossly, she displays weakness of the UE and LE evident by MTM and functional assessment. She demonstrates indep with bed mobility, however requires a RW and CGA with several attempts to stand. Her dynamic standing balance is decreased from her last session with PT several days ago, with limited functional reach and overall increased unsteadiness and weakness with ambulation. She requires RW as well as CGA for safety during ambulation and would benefit from a RW for use at home. Throughout her session, PT noted mild intentional tremor which the pt attributed to recent withdrawal from medications, therapist will continue to monitor during future sessions. She would benefit from continued PT services while at [facility] as well as continued services at d/c with supervision/assistance with mobility and OOB activity.     Follow Up Recommendations Home health PT;Supervision for mobility/OOB    Equipment Recommendations  Rolling walker with 5" wheels    Recommendations for Other Services       Precautions / Restrictions Restrictions Weight Bearing Restrictions: No      Mobility  Bed Mobility Overal bed mobility: Independent             General bed mobility comments: Pt reports she requires assistance transfering from bed to [facility]  Transfers Overall transfer level: Needs assistance Equipment used: Rolling walker (2 wheeled) Transfers: Sit  to/from Stand Sit to Stand: Min guard         General transfer comment: Requiring 2 attempts to stand  Ambulation/Gait Ambulation/Gait assistance: Modified independent (Device/Increase time);Min guard Ambulation Distance (Feet): 30 Feet Assistive device: None;Rolling walker (2 wheeled) Gait Pattern/deviations: Decreased step length - right;Decreased step length - left   Gait velocity interpretation: <1.8 ft/sec, indicative of risk for recurrent falls General Gait Details: Distance limited by pt fatigue and weakness.  Stairs            Wheelchair Mobility    Modified Rankin (Stroke Patients Only)       Balance Overall balance assessment: Needs assistance Sitting-balance support: No upper extremity supported Sitting balance-Leahy Scale: Good     Standing balance support: No upper extremity supported Standing balance-Leahy Scale: Fair Standing balance comment: Standing functional reach <7in indicating moderate risk of falls     Tandem Stance - Right Leg: 30 Tandem Stance - Left Leg: 30 [place]berg - Eyes Opened: 30 Rhomberg - Eyes Closed: 30                 Pertinent Vitals/Pain Pain Assessment: 0-10 Pain Score: 10-Worst pain ever Pain Location: Low back and B feet Pain Descriptors / Indicators: Aching;Other (Comment) (reports BLE is due to hx of neuropathy) Pain Intervention(s): Monitored during session;Patient requesting pain meds-RN notified    Home Living Family/patient expects to be discharged to:: Private residence Living Arrangements: Alone Available Help at Discharge: Family;Neighbor Type of Home: Mobile home Home Access: Level entry (ramp to enter home)     Home Layout: One  level Home Equipment: Cane - single point;Bedside commode;Tub bench      Prior Function Level of Independence: Independent               Hand Dominance        Extremity/Trunk Assessment   Upper Extremity Assessment: Generalized weakness (Grossly 4/5)            Lower Extremity Assessment: Generalized weakness (Grossly 4/5)         Communication   Communication: No difficulties  Cognition Arousal/Alertness: Awake/alert Behavior During Therapy: WFL for tasks assessed/performed Overall Cognitive Status: Within Functional Limits for tasks assessed                      General Comments      Exercises        Assessment/Plan    PT Assessment Patient needs continued PT services  PT Diagnosis Generalized weakness;Difficulty walking   PT Problem List Decreased strength;Decreased activity tolerance;Decreased balance;Decreased mobility;Pain  PT Treatment Interventions Gait training;Functional mobility training;Therapeutic activities;Therapeutic exercise;Patient/family education;Balance training   PT Goals (Current goals can be found in the Care Plan section)      Frequency Min 3X/week   Barriers to discharge        Co-evaluation               End of Session Equipment Utilized During Treatment: Gait belt Activity Tolerance: Patient limited by fatigue Patient left: with call bell/phone within reach;with nursing/sitter in room;in bed;with bed alarm set;with family/visitor present           Time: [id number] PT Time Calculation (min) (ACUTE ONLY): 27 min   Charges:   PT Evaluation $PT Eval Low Complexity: 1 Procedure PT Treatments $Therapeutic Activity: 23-37 mins   PT G Codes:          11:11 AM,2015-08-27 Elly Modena PT, DPT [facility] Outpatient Physical Therapy [phone]

## 2015-08-22 NOTE — Progress Notes (Signed)
EEG Completed; Results Pending  

## 2015-08-23 LAB — CULTURE, BLOOD (ROUTINE X 2)
Culture: NO GROWTH
Culture: NO GROWTH

## 2015-08-23 NOTE — Progress Notes (Signed)
Physical Therapy Treatment Patient Details Name: [name] MRN: [id number] DOB: [birth year] Today's Date: 08/23/2015    History of Present Illness 60yo white female who comes to [facility] after worsening SOB and hypoxia noticed during a chemo treatment here at [facility] oncology and cancer center. Pt is currently receiving 2.5 L O2 nasal canula. At baseline pt is fully Indep.     PT Comments    Pt had no c/o today, was on 2.5 L O2 with O2 sat=100%.  She was able to tolerate therapeutic exercise seated on  room air with no dyspnea.  She was insrtructed in gait using a walker, able to ambulate 100' with good stability.  O2 sat= 99% after gait while on room air.  She will continue to benefit from using a walker until fully recovered.  [name] currently declining any HHPT.   Follow Up Recommendations  No PT follow up (pt declines HHPT)     Equipment Recommendations  Rolling walker with 5" wheels    Recommendations for Other Services  none     Precautions / Restrictions Precautions Precautions: None Restrictions Weight Bearing Restrictions: No    Mobility  Bed Mobility Overal bed mobility: Independent             General bed mobility comments: Pt reports she requires assistance transfering from bed to [facility]  Transfers Overall transfer level: Modified independent Equipment used: Rolling walker (2 wheeled) Transfers: Sit to/from Stand Sit to Stand: Supervision         General transfer comment: no instability wth standing  Ambulation/Gait Ambulation/Gait assistance: Supervision Ambulation Distance (Feet): 100 Feet Assistive device: Rolling walker (2 wheeled) Gait Pattern/deviations: WFL(Within Functional Limits)   Gait velocity interpretation: <1.8 ft/sec, indicative of risk for recurrent falls General Gait Details: on room air wth O2 sat=99% after gait   Stairs            Wheelchair Mobility    Modified Rankin (Stroke Patients Only)       Balance                                    Cognition Arousal/Alertness: Awake/alert Behavior During Therapy: WFL for tasks assessed/performed Overall Cognitive Status: Within Functional Limits for tasks assessed                      Exercises General Exercises - Lower Extremity Ankle Circles/Pumps: AROM;Both;10 reps;Seated Long Arc Quad: AROM;Both;10 reps;Seated Hip Flexion/Marching: AROM;Both;20 reps;Seated    General Comments        Pertinent Vitals/Pain Pain Assessment: No/denies pain    Home Living                      Prior Function            PT Goals (current goals can now be found in the care plan section) Progress towards PT goals: Progressing toward goals    Frequency  Min 3X/week    PT Plan Discharge plan needs to be updated    Co-evaluation             End of Session Equipment Utilized During Treatment: Gait belt Activity Tolerance: Patient tolerated treatment well Patient left: in bed;with call bell/phone within reach;with bed alarm set     Time: 1040-1109 PT Time Calculation (min) (ACUTE ONLY): 29 min  Charges:  $Gait Training: 8-22 mins $Therapeutic Exercise: 8-22 mins  G CodesDemetrios Isaacs L  PT 08/23/2015, 11:17 AM

## 2015-08-23 NOTE — Care Management Note (Signed)
Case Management Note  Patient Details  Name: CONCHETTA MOLTON MRN: [id number] Date of Birth: Aug 29, 1955  Subjective/Objective:     Spoke with patient who is oriented this afternoon. Patient answers appropriately.  Will be going home with her sister at discharge.              Patietn will be under [place] with [place].   Action/Plan:  Home with sister and [place] program. Expected Discharge Date:  08/20/15               Expected Discharge Plan:  [place]  In-House Referral:  Clinical Social Work  Discharge planning Services  CM Consult  Post Acute Care Choice:    Choice offered to:  Patient  DME Arranged:    DME Agency:  [place] Arranged:  RN, Disease Management [place] Agency:     Status of Service:  In process, will continue to follow  Medicare Important Message Given:  Yes Date Medicare IM Given:    Medicare IM give by:    Date Additional Medicare IM Given:    Additional Medicare Important Message give by:     If discussed at [place] of Stay Meetings, dates discussed:    Additional Comments:  Alvie Heidelberg, RN 08/23/2015, 3:15 PM

## 2015-08-23 NOTE — Progress Notes (Signed)
Subjective: [name] much improved. The help from Dr. Merlene Laughter is noted and appreciated  Objective: Vital signs in last 24 hours: Temp:  [98.1 F (36.7 C)-98.3 F (36.8 C)] 98.1 F (36.7 C) (02/21 0324) Pulse Rate:  [64-77] 77 (02/21 0324) Resp:  [18-20] 20 (02/21 0324) BP: (124-147)/(53-67) 147/67 mmHg (02/21 0324) SpO2:  [96 %-97 %] 96 % (02/21 0324) Weight:  [54.159 kg (119 lb 6.4 oz)] 54.159 kg (119 lb 6.4 oz) (02/21 0324) Weight change: 0.68 kg (1 lb 8 oz) Last BM Date: 08/23/15  Intake/Output from previous day: 02/20 0701 - 02/21 0700 In: 180 [P.O.:180] Out: 400 [Urine:400]  PHYSICAL EXAM General appearance: alert, cooperative and mild distress Resp: clear to auscultation bilaterally Cardio: regular rate and rhythm, S1, S2 normal, no murmur, click, rub or gallop GI: soft, non-tender; bowel sounds normal; no masses,  no organomegaly Extremities: extremities normal, atraumatic, no cyanosis or edema  Lab Results:  No results found for this or any previous visit (from the past 48 hour(s)).  ABGS No results for input(s): PHART, PO2ART, TCO2, HCO3 in the last 72 hours.  Invalid input(s): PCO2 CULTURES Recent Results (from the past 240 hour(s))  Blood culture (routine x 2)     Status: None (Preliminary result)   Collection Time: 08/18/15  3:55 PM  Result Value Ref Range Status   Specimen Description BLOOD RIGHT HAND  Final   Special Requests BOTTLES DRAWN AEROBIC ONLY 6CC  Final   Culture NO GROWTH 4 DAYS  Final   Report Status PENDING  Incomplete  Blood culture (routine x 2)     Status: None (Preliminary result)   Collection Time: 08/18/15  4:15 PM  Result Value Ref Range Status   Specimen Description BLOOD LEFT ANTECUBITAL DRAWN BY RN  Final   Special Requests BOTTLES DRAWN AEROBIC AND ANAEROBIC 5CC EACH  Final   Culture NO GROWTH 4 DAYS  Final   Report Status PENDING  Incomplete   Studies/Results: Mr Kizzie Fantasia Contrast  08/22/2015  CLINICAL DATA:  Weakness and  confusion. Acute encephalopathy. History of non-Hodgkin's lymphoma EXAM: MRI HEAD WITHOUT AND WITH CONTRAST TECHNIQUE: Multiplanar, multiecho pulse sequences of the brain and surrounding structures were obtained without and with intravenous contrast. CONTRAST:  58mL MULTIHANCE GADOBENATE DIMEGLUMINE 529 MG/ML IV SOLN COMPARISON:  10/08/2008 FINDINGS: Motion degraded study, adequate and best obtainable given patient patient reports head tremor. Calvarium and upper cervical spine: No focal marrow signal abnormality. Orbits: Negative. Sinuses and Mastoids: Lobulated mucosal edema/thickening in the bilateral maxillary and right sphenoid sinuses. Fluid levels also present in these air cells. Patchy mucosal thickening in the other paranasal sinuses. Bilateral mastoid fluid or mucosal thickening with clear nasopharynx, chronic based on prior study. Brain: No acute or remote infarct, hemorrhage, hydrocephalus, or mass lesion. Similar extent of nonspecific bilateral cerebral white matter FLAIR hyperintensity. Given patient's multiple vascular risk factors, this is presumably chronic small vessel disease. No evidence of large vessel occlusion. IMPRESSION: 1. No acute intracranial finding. 2. Advanced sinusitis with acute on chronic features. 3. Moderate for age white matter disease, similar to 2010 and likely chronic microvascular ischemia. 4. Bilateral chronic mastoiditis. Electronically Signed   By: Monte Fantasia M.D.   On: 08/22/2015 09:27    Medications:  Prior to Admission:  Prescriptions prior to admission  Medication Sig Dispense Refill Last Dose  . albuterol (PROVENTIL) (2.5 MG/3ML) 0.083% nebulizer solution Take 3 mLs (2.5 mg total) by nebulization every 4 (four) hours as needed for wheezing or  shortness of breath. 75 mL 12 08/18/2015 at Unknown time  . albuterol (VENTOLIN HFA) 108 (90 BASE) MCG/ACT inhaler Inhale 2 puffs into the lungs every 6 (six) hours as needed for wheezing or shortness of breath  (wheezing and shortness of breath). Reported on 08/09/2015   08/18/2015 at Unknown time  . ALPRAZolam (XANAX) 1 MG tablet Take 1 mg by mouth 3 (three) times daily as needed for anxiety or sleep (sleep).    08/17/2015 at Unknown time  . amitriptyline (ELAVIL) 50 MG tablet Take 50 mg by mouth at bedtime.   08/17/2015 at Unknown time  . aspirin 81 MG EC tablet Take 81 mg by mouth daily.     08/18/2015 at Unknown time  . Biotin 1 MG CAPS Take 1 tablet by mouth daily. Reported on 08/09/2015   08/18/2015 at Unknown time  . BREO ELLIPTA 100-25 MCG/INH AEPB Take 1 Inhaler by mouth 2 (two) times daily.   08/18/2015 at Unknown time  . buPROPion (WELLBUTRIN XL) 150 MG 24 hr tablet Take 150 mg by mouth daily.   08/18/2015 at Unknown time  . clopidogrel (PLAVIX) 75 MG tablet TAKE 1 TABLET DAILY WITH BREAKFAST. 30 tablet 6 08/18/2015 at Unknown time  . cycloSPORINE (RESTASIS) 0.05 % ophthalmic emulsion Place 1 drop into both eyes 2 (two) times daily.   08/18/2015 at Unknown time  . diazepam (VALIUM) 5 MG tablet Take 5 mg by mouth every 6 (six) hours as needed for muscle spasms (muscle spasms). For muscle spasms   08/18/2015 at Unknown time  . furosemide (LASIX) 40 MG tablet Take 40 mg by mouth daily as needed for fluid (fluid).    08/18/2015 at Unknown time  . isosorbide mononitrate (IMDUR) 30 MG 24 hr tablet TAKE 1/2 TABLET BY MOUTH DAILY. 45 tablet 6 08/18/2015 at Unknown time  . metoprolol (LOPRESSOR) 50 MG tablet TAKE (1/2) TABLET BY MOUTH TWICE DAILY. 30 tablet 10 08/18/2015 at Unknown time  . mupirocin ointment (BACTROBAN) 2 % Apply 1 application topically daily as needed (leg infection). Reported on 08/09/2015   08/18/2015 at Unknown time  . NEXIUM 40 MG capsule Take 40 mg by mouth daily.    08/18/2015 at Unknown time  . oxycodone (ROXICODONE) 30 MG immediate release tablet Take 30 mg by mouth every 4 (four) hours as needed for pain (pain). For breakthrough pain   08/18/2015 at Unknown time  . OXYCONTIN 80 MG 12 hr tablet Take  80 mg by mouth 4 (four) times daily as needed.   08/18/2015 at Unknown time  . predniSONE (DELTASONE) 50 MG tablet Take 1 tablet (50 mg total) by mouth daily with breakfast. 10 tablet 0 08/18/2015 at Unknown time  . rosuvastatin (CRESTOR) 40 MG tablet Take 40 mg by mouth daily.     08/18/2015 at Unknown time  . sulfaDIAZINE 500 MG tablet Take 1 tablet by mouth 2 (two) times daily. Only on weekends   Past Month at Unknown time  . sulfamethoxazole-trimethoprim (BACTRIM DS) 800-160 MG per tablet Take 1 tablet by mouth 2 (two) times a week. Reported on 08/09/2015   08/18/2015 at Unknown time  . SYMBICORT 160-4.5 MCG/ACT inhaler Inhale 2 puffs into the lungs every 12 (twelve) hours.   11 08/18/2015 at Unknown time  . vitamin B-12 (CYANOCOBALAMIN) 100 MCG tablet Take 100 mcg by mouth daily.   08/18/2015 at Unknown time  . NITROSTAT 0.4 MG SL tablet DISSOLVE 1 TABLET UNDER TONGUE EVERY 5 MINUTES UP TO 15 MINUTES FOR CHEST PAIN.  IF NO RELIEF CALL 911. 25 tablet 3 Taking   Scheduled: . buPROPion  150 mg Oral Daily  . clopidogrel  75 mg Oral Daily  . clotrimazole  10 mg Oral 5 X Daily  . cycloSPORINE  1 drop Both Eyes BID  . fluconazole (DIFLUCAN) IV  100 mg Intravenous Q24H  . fluticasone furoate-vilanterol  1 puff Inhalation Daily  . hydrocortisone sod succinate (SOLU-CORTEF) inj  50 mg Intravenous Q12H  . isosorbide mononitrate  15 mg Oral Daily  . metoprolol  25 mg Oral BID  . predniSONE  50 mg Oral Q breakfast  . rosuvastatin  40 mg Oral Daily  . sodium chloride flush  3 mL Intravenous Q12H  . sodium chloride flush  3 mL Intravenous Q12H  . [START ON 08/27/2015] sulfaDIAZINE  500 mg Oral 2 times per day on Sun Sat  . vitamin B-12  100 mcg Oral Daily   Continuous: . sodium chloride 75 mL/hr at 08/22/15 2328   [id number] chloride, acetaminophen (TYLENOL) oral liquid 160 mg/5 mL, acetaminophen, albuterol, albuterol, ALPRAZolam, mupirocin ointment, nitroGLYCERIN, ondansetron **OR** ondansetron (ZOFRAN)  IV, oxyCODONE, sodium chloride flush  Assesment: She was admitted with acute encephalopathy. I don't think it's still quite clear what happened. She does have an abnormal EEG so she might have had a seizure. [name] on a lot of medications that could cause her to become encephalopathic but according to family who checked her medication she did not actually take any of those before she became encephalopathic so I don't know what happened. At any rate she's much better Principal Problem:   Encephalopathy Active Problems:   HYPERTENSION, BENIGN   CAD, NATIVE VESSEL   Vulvar cancer ([place])   HCAP (healthcare-associated pneumonia)   Acute respiratory failure with hypoxia (HCC)   COPD (chronic obstructive pulmonary disease) (HCC)   Malnutrition of moderate degree    Plan: Continue physical therapy. Try to get her up and moving around more. Discharge home tomorrow she is going to go home with her sister    LOS: 5 days   Lannette Avellino L 08/23/2015, 9:10 AM

## 2015-08-24 MED ORDER — ALPRAZOLAM 1 MG PO TABS: 0.5000 mg | ORAL_TABLET | Freq: Three times a day (TID) | ORAL | Status: DC | PRN

## 2015-08-24 MED ORDER — OXYCODONE HCL 10 MG PO TABS: 10.0000 mg | ORAL_TABLET | ORAL | Status: DC | PRN

## 2015-08-24 MED ORDER — CLOTRIMAZOLE 10 MG MT TROC: 10.0000 mg | Freq: Every day | OROMUCOSAL | Status: DC

## 2015-08-24 MED ORDER — OXYCODONE HCL ER 20 MG PO T12A: 20.0000 mg | EXTENDED_RELEASE_TABLET | Freq: Two times a day (BID) | ORAL | Status: DC

## 2015-08-24 NOTE — Care Management (Signed)
Patient will be going home with her sister Caren Griffins [id number], [place] [address] [place].

## 2015-08-24 NOTE — Discharge Summary (Signed)
Physician Discharge Summary  Patient ID: [name] MRN: [id number] DOB/AGE: [birth year] 58 y.o. Primary Care Physician:Natania Finigan L, MD Admit date: 08/18/2015 Discharge date: 08/24/2015    Discharge Diagnoses:   Principal Problem:   Encephalopathy Active Problems:   HYPERTENSION, BENIGN   CAD, NATIVE VESSEL   Vulvar cancer ([place])   HCAP (healthcare-associated pneumonia)   Acute respiratory failure with hypoxia (HCC)   COPD (chronic obstructive pulmonary disease) (HCC)   Malnutrition of moderate degree     Medication List    STOP taking these medications        diazepam 5 MG tablet  Commonly known as:  VALIUM     sulfamethoxazole-trimethoprim 800-160 MG per tablet  Commonly known as:  BACTRIM DS     SYMBICORT 160-4.5 MCG/ACT inhaler  Generic drug:  budesonide-formoterol      TAKE these medications        ALPRAZolam 1 MG tablet  Commonly known as:  XANAX  Take 0.5 tablets (0.5 mg total) by mouth 3 (three) times daily as needed for anxiety or sleep (sleep). You can cut your current tablets in half     amitriptyline 50 MG tablet  Commonly known as:  ELAVIL  Take 50 mg by mouth at bedtime.     aspirin 81 MG EC tablet  Take 81 mg by mouth daily.     Biotin 1 MG Caps  Take 1 tablet by mouth daily. Reported on 08/09/2015     BREO ELLIPTA 100-25 MCG/INH Aepb  Generic drug:  fluticasone furoate-vilanterol  Take 1 Inhaler by mouth 2 (two) times daily.     buPROPion 150 MG 24 hr tablet  Commonly known as:  WELLBUTRIN XL  Take 150 mg by mouth daily.     clopidogrel 75 MG tablet  Commonly known as:  PLAVIX  TAKE 1 TABLET DAILY WITH BREAKFAST.     clotrimazole 10 MG troche  Commonly known as:  MYCELEX  Take 1 tablet (10 mg total) by mouth 5 (five) times daily.     cycloSPORINE 0.05 % ophthalmic emulsion  Commonly known as:  RESTASIS  Place 1 drop into both eyes 2 (two) times daily.     furosemide 40 MG tablet  Commonly known as:  LASIX  Take 40 mg by  mouth daily as needed for fluid (fluid).     isosorbide mononitrate 30 MG 24 hr tablet  Commonly known as:  IMDUR  TAKE 1/2 TABLET BY MOUTH DAILY.     metoprolol 50 MG tablet  Commonly known as:  LOPRESSOR  TAKE (1/2) TABLET BY MOUTH TWICE DAILY.     mupirocin ointment 2 %  Commonly known as:  BACTROBAN  Apply 1 application topically daily as needed (leg infection). Reported on 08/09/2015     NEXIUM 40 MG capsule  Generic drug:  esomeprazole  Take 40 mg by mouth daily.     NITROSTAT 0.4 MG SL tablet  Generic drug:  nitroGLYCERIN  DISSOLVE 1 TABLET UNDER TONGUE EVERY 5 MINUTES UP TO 15 MINUTES FOR CHEST PAIN. IF NO RELIEF CALL 911.     Oxycodone HCl 10 MG Tabs  Take 1 tablet (10 mg total) by mouth every 4 (four) hours as needed for severe pain.     oxyCODONE 20 mg 12 hr tablet  Commonly known as:  OXYCONTIN  Take 1 tablet (20 mg total) by mouth every 12 (twelve) hours.     predniSONE 50 MG tablet  Commonly known as:  DELTASONE  Take 1  tablet (50 mg total) by mouth daily with breakfast.     rosuvastatin 40 MG tablet  Commonly known as:  CRESTOR  Take 40 mg by mouth daily.     sulfaDIAZINE 500 MG tablet  Take 1 tablet by mouth 2 (two) times daily. Only on weekends     VENTOLIN HFA 108 (90 Base) MCG/ACT inhaler  Generic drug:  albuterol  Inhale 2 puffs into the lungs every 6 (six) hours as needed for wheezing or shortness of breath (wheezing and shortness of breath). Reported on 08/09/2015     albuterol (2.5 MG/3ML) 0.083% nebulizer solution  Commonly known as:  PROVENTIL  Take 3 mLs (2.5 mg total) by nebulization every 4 (four) hours as needed for wheezing or shortness of breath.     vitamin B-12 100 MCG tablet  Commonly known as:  CYANOCOBALAMIN  Take 100 mcg by mouth daily.        Discharged Condition: Improved    Consults: Neurology/Dr. Merlene Laughter  Significant Diagnostic Studies: Dg Chest 2 View  08/09/2015  CLINICAL DATA:  60 year old with personal history of  treated non-Hodgkin's lymphoma,, including pulmonary parenchymal involvement, presenting with 1 week history of fever and acute onset of shortness of breath earlier today. Hypoxia upon clinical examination earlier today. 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 EXAM: CHEST  2 VIEW COMPARISON:  PET-CT 08/20/2014 and earlier. Chest x-rays 08/12/2014 and earlier. FINDINGS: Left subclavian Port-A-Cath tip remains in the mid SVC. Cardiac silhouette normal in size, unchanged. Thoracic aorta mildly atherosclerotic, unchanged. Since the most recent examinations 1 year ago, development of right hilar enlargement and development of diffuse interstitial pulmonary opacities throughout both lungs. Patchy airspace opacities in the right middle lobe and right upper lobe. Chronic scarring at the site of the prior left lower lobe biopsy. No pleural effusions. No pneumothorax. Visualized bony thorax intact. IMPRESSION: Since the chest x-ray and PET-CT in February, 2016: 1. New diffuse interstitial opacities throughout both lungs with associated patchy airspace opacities in the right middle lobe and right upper lobe. Recurrent pulmonary lymphoma, atypical infection, recurrent Langerhans histiocytosis and chemotherapy induced pulmonary disease with superimposed infection are diagnostic considerations. 2. New right hilar enlargement worrisome for lymphadenopathy. No evidence of lymphadenopathy elsewhere. Electronically Signed   By: Evangeline Dakin M.D.   On: 08/09/2015 10:17   Ct Head Wo Contrast  08/20/2015  CLINICAL DATA:  Encephalopathy acute. Hx of stroke,seizures, non Hodgkins lymphoma, vulvar cancer. EXAM: CT HEAD WITHOUT CONTRAST TECHNIQUE: Contiguous axial images were obtained from the base of the skull through the vertex without intravenous contrast. COMPARISON:  10/08/2008 FINDINGS: Ventricles are normal in size and configuration. There are no parenchymal masses or mass effect. There is no evidence of a  cortical infarct. There are minor areas of subcortical white matter hypoattenuation consistent with chronic microvascular ischemic change. There are no extra-axial masses or abnormal fluid collections. There is no intracranial hemorrhage. There is significant sinus disease. The visualized maxillary sinuses are opacified. Right sphenoid sinus is mostly opacified with dependent fluid. Left sphenoid sinus is opacified. Most of the ethmoid air cells are opacified. Mastoid air cells and middle ear cavities are clear. IMPRESSION: 1. No acute intracranial abnormalities. 2. Significant sinus disease as detailed above including an air-fluid level in the right sphenoid sinus. Electronically Signed   By: Lajean Manes M.D.   On: 08/20/2015 14:00   Mr Jeri Cos [id number] Contrast  08/22/2015  CLINICAL DATA:  Weakness and confusion. Acute encephalopathy. History of non-Hodgkin's lymphoma EXAM:  MRI HEAD WITHOUT AND WITH CONTRAST TECHNIQUE: Multiplanar, multiecho pulse sequences of the brain and surrounding structures were obtained without and with intravenous contrast. CONTRAST:  38mL MULTIHANCE GADOBENATE DIMEGLUMINE 529 MG/ML IV SOLN COMPARISON:  10/08/2008 FINDINGS: Motion degraded study, adequate and best obtainable given patient patient reports head tremor. Calvarium and upper cervical spine: No focal marrow signal abnormality. Orbits: Negative. Sinuses and Mastoids: Lobulated mucosal edema/thickening in the bilateral maxillary and right sphenoid sinuses. Fluid levels also present in these air cells. Patchy mucosal thickening in the other paranasal sinuses. Bilateral mastoid fluid or mucosal thickening with clear nasopharynx, chronic based on prior study. Brain: No acute or remote infarct, hemorrhage, hydrocephalus, or mass lesion. Similar extent of nonspecific bilateral cerebral white matter FLAIR hyperintensity. Given patient's multiple vascular risk factors, this is presumably chronic small vessel disease. No evidence of large  vessel occlusion. IMPRESSION: 1. No acute intracranial finding. 2. Advanced sinusitis with acute on chronic features. 3. Moderate for age white matter disease, similar to 2010 and likely chronic microvascular ischemia. 4. Bilateral chronic mastoiditis. Electronically Signed   By: Monte Fantasia M.D.   On: 08/22/2015 09:27   US Renal  08/11/2015  CLINICAL DATA:  ATN.  Chronic kidney disease. EXAM: RENAL / URINARY TRACT ULTRASOUND COMPLETE COMPARISON:  CT abdomen pelvis 06/09/2015 FINDINGS: Right Kidney: Length: 11.3 cm. Renal parenchyma is echogenic. No focal renal lesion or hydronephrosis. Left Kidney: Length: 10.9 cm. Renal parenchyma echogenic. No focal renal lesion or hydronephrosis. Bladder: Appears normal for degree of bladder distention. Bilateral ureteral jets are present. IMPRESSION: Renal parenchyma is echogenic bilaterally.  No hydronephrosis. Electronically Signed   By: Nolon Nations M.D.   On: 08/11/2015 08:51   Dg Chest Portable 1 View  08/18/2015  CLINICAL DATA:  Hypoxia.  Shortness of breath. EXAM: PORTABLE CHEST - 1 VIEW COMPARISON:  Two-view chest x-ray 08/09/2015. FINDINGS: Chronic diffuse interstitial coarsening is again noted bilaterally. Previously-seen superimposed airspace disease has cleared. The heart size is normal. A left subclavian Port-A-Cath is stable. IMPRESSION: 1. Interval clearing of right-sided airspace disease. 2. Chronic interstitial coarsening is stable. 3. No acute cardiopulmonary disease. Electronically Signed   By: San Morelle M.D.   On: 08/18/2015 15:51    Lab Results: Basic Metabolic Panel: No results for input(s): NA, K, CL, CO2, GLUCOSE, BUN, CREATININE, CALCIUM, MG, PHOS in the last 72 hours. Liver Function Tests: No results for input(s): AST, ALT, ALKPHOS, BILITOT, PROT, ALBUMIN in the last 72 hours.   CBC: No results for input(s): WBC, NEUTROABS, HGB, HCT, MCV, PLT in the last 72 hours.  Recent Results (from the past 240 hour(s))  Blood  culture (routine x 2)     Status: None   Collection Time: 08/18/15  3:55 PM  Result Value Ref Range Status   Specimen Description BLOOD RIGHT HAND  Final   Special Requests BOTTLES DRAWN AEROBIC ONLY [place]  Final   Culture NO GROWTH 5 DAYS  Final   Report Status 08/23/2015 FINAL  Final  Blood culture (routine x 2)     Status: None   Collection Time: 08/18/15  4:15 PM  Result Value Ref Range Status   Specimen Description BLOOD LEFT ANTECUBITAL DRAWN BY RN  Final   Special Requests BOTTLES DRAWN AEROBIC AND ANAEROBIC 5CC EACH  Final   Culture NO GROWTH 5 DAYS  Final   Report Status 08/23/2015 FINAL  Final     Hospital Course: This is a 60 year old who had been discharged home and then was found  unresponsive the next morning. It's not clear what happened. Her family says that none of her medications had been taken. No definite seizure disorder. She was treated with IV fluids and supportive care and had consultation with Dr. Merlene Laughter from neurology. Because she's been on high-dose medications for pain he was concerned that this might be part of the problem. She also had EEG that showed questionable areas that could produce a seizure. She was not started on seizure medication and improved.  Discharge Exam: Blood pressure 141/80, pulse 52, temperature 98.1 F (36.7 C), temperature source Oral, resp. rate 21, height 5\' 5"  (1.651 m), weight 54.432 kg (120 lb), SpO2 96 %. [name] awake and alert. She still has some episodes of confusion. [name] much better  Disposition: Home with home health services. I have drastically modified her medications that could alter her mental status she's going to go home with her sister      Discharge Instructions    Discharge patient    Complete by:  As directed      Face-to-face encounter (required for Medicare/Medicaid patients)    Complete by:  As directed   I Elliott Lasecki L certify that this patient is under my care and that I, or a nurse practitioner or  physician's assistant working with me, had a face-to-face encounter that meets the physician face-to-face encounter requirements with this patient on 08/24/2015. The encounter with the patient was in whole, or in part for the following medical condition(s) which is the primary reason for home health care (List medical condition): Encephalopathy  The encounter with the patient was in whole, or in part, for the following medical condition, which is the primary reason for home health care:  Encephalopathy  I certify that, based on my findings, the following services are medically necessary home health services:   Nursing Physical therapy    Reason for Medically Necessary Home Health Services:  Skilled Nursing- Change/Decline in Patient Status  My clinical findings support the need for the above services:  Cognitive impairments, dementia, or mental confusion  that make it unsafe to leave home  Further, I certify that my clinical findings support that this patient is homebound due to:  Mental confusion     Home Health    Complete by:  As directed   To provide the following care/treatments:   PT RN               Signed: Swayze Pries L   08/24/2015, 8:54 AM

## 2015-08-24 NOTE — Progress Notes (Signed)
She feels better. [name] still oriented. [name] going to go home with her sister. [name] ready for discharge home. Please see discharge summary for details

## 2015-08-24 NOTE — Care Management Important Message (Signed)
Important Message  Patient Details  Name: [name] MRN: [id number] Date of Birth: [birth year]   Medicare Important Message Given:  Yes    Alvie Heidelberg, RN 08/24/2015, 9:16 AM

## 2015-08-24 NOTE — Progress Notes (Signed)
Patient alert and oriented, independent, VSS, pt. Tolerating diet well. No complaints of pain or nausea. Pt. Had IV removed tip intact. Pt. Had prescriptions given. Pt. Voiced understanding of discharge instructions with no further questions. Pt. Discharged via wheelchair with auxilliary.  

## 2015-08-25 NOTE — Care Management Important Message (Deleted)
Important Message  Patient Details  Name: [name] MRN: [id number] Date of Birth: 1956/04/13   Medicare Important Message Given:  Yes    Alvie Heidelberg, RN 08/25/2015, 8:19 AM

## 2015-08-29 ENCOUNTER — Inpatient Hospital Stay (HOSPITAL_COMMUNITY): Payer: Medicare HMO

## 2015-08-29 ENCOUNTER — Ambulatory Visit (HOSPITAL_COMMUNITY): Payer: Medicare HMO | Admitting: Oncology

## 2015-09-19 ENCOUNTER — Telehealth (HOSPITAL_COMMUNITY): Payer: Self-pay | Admitting: *Deleted

## 2015-09-19 NOTE — Telephone Encounter (Signed)
No, we will not participate in her pain management.

## 2015-09-19 NOTE — Telephone Encounter (Signed)
Patient wants to know if he will write for her pain medicines instead of [name]. Her pain is in her back and legs. She complains that [name] has "cut her down to nothing" States she used to be on oxycontin 80 BID and has been cut down to 20 mg. She also c/o that her Oxycodone has been decreased. I advised her that it was doubtful that we would take over this since Luan Pulling has been in charge of this. Please Advise

## 2015-09-20 NOTE — Telephone Encounter (Signed)
Pt.notified

## 2015-09-26 ENCOUNTER — Encounter (HOSPITAL_COMMUNITY): Payer: Self-pay

## 2015-10-10 NOTE — Discharge Summary (Signed)
Physician Discharge Summary  [name] [id number] DOB: [birth year] DOA: 08/09/2015  PCP: [name], MD  Admit date: 08/09/2015 Discharge date: 10/10/2015   Recommendations for Outpatient Follow-up:  The patient is urged to take Levaquin 750 mg by mouth daily Daily for an additional 7 days then discontinue. [name] also urged to take prednisone 50 mg by mouth daily for 7 days and this decrease it to 20 mg by mouth daily for an additional 7 days then discontinue she is urged to limit her narcotic analgesia which may been contributory to her hypoxic respiratory insufficiency upon admission Discharge Diagnoses:  Principal Problem:   Acute respiratory failure with hypoxia ([place]) Active Problems:   HYPERTENSION, BENIGN   CAD, NATIVE VESSEL   HCAP (healthcare-associated pneumonia)   COPD with exacerbation (HCC)   Acute kidney injury ([place])   Hypokalemia   Hyponatremia   Lymphocytic lymphoma ([place])   Protein-calorie malnutrition, severe   Discharge Condition: Good and improving  Filed Weights   08/14/15 0500 08/16/15 0511 08/17/15 0437  Weight: 130 lb 8.2 oz (59.2 kg) 126 lb 3.2 oz (57.244 kg) 125 lb 14.8 oz (57.12 kg)    History of present illness:  Discharge summary is done several weeks after discharge to best of my recollection the patient had hypoxic respiratory insufficiency due to combination of healthcare acquired pneumonia as well as narcotic analgesia which she desaturated to 78% she likewise upon admission had acute renal insufficiency due to prerenal acidemia as well as hyponatremia her sodium was corrected throughout hospital stay by aggressive intravenous fluids her renal function improved with aggressive fluid resuscitation she likewise had hypokalemia which was rectified orally and intravenously over the course of several days she was treated with a combination of vancomycin and cefepime and continue to improve she was to be discharged on IV Levaquin 750 mg by  mouth daily for an additional 7 days post discharge as well as Solu-Medrol 50 mg by mouth daily for 7 days then decreasing to 20 mg by mouth daily for an additional 7 days then discontinue she is urged to follow-up in my office if [name] is not back within several days of discharge  Hospital Course:  See history of present illness  Procedures:    Consultations: Neurology Discharge Instructions  Discharge Instructions    Discharge instructions    Complete by:  As directed      Discharge patient    Complete by:  As directed             Medication List    STOP taking these medications        ALPRAZolam 1 MG tablet  Commonly known as:  XANAX     ciprofloxacin 500 MG tablet  Commonly known as:  CIPRO     diazepam 5 MG tablet  Commonly known as:  VALIUM     OXYCONTIN 80 mg 12 hr tablet  Generic drug:  oxyCODONE     ROXICODONE 30 MG immediate release tablet  Generic drug:  oxycodone     sulfamethoxazole-trimethoprim 800-160 MG per tablet  Commonly known as:  BACTRIM DS     SYMBICORT 160-4.5 MCG/ACT inhaler  Generic drug:  budesonide-formoterol      TAKE these medications        amitriptyline 50 MG tablet  Commonly known as:  ELAVIL  Take 50 mg by mouth at bedtime.     aspirin 81 MG EC tablet  Take 81 mg by mouth daily.  Biotin 1 MG Caps  Take 1 tablet by mouth daily. Reported on 08/09/2015     BREO ELLIPTA 100-25 MCG/INH Aepb  Generic drug:  fluticasone furoate-vilanterol  Take 1 Inhaler by mouth 2 (two) times daily.     buPROPion 150 MG 24 hr tablet  Commonly known as:  WELLBUTRIN XL  Take 150 mg by mouth daily.     clopidogrel 75 MG tablet  Commonly known as:  PLAVIX  TAKE 1 TABLET DAILY WITH BREAKFAST.     cycloSPORINE 0.05 % ophthalmic emulsion  Commonly known as:  RESTASIS  Place 1 drop into both eyes 2 (two) times daily.     furosemide 40 MG tablet  Commonly known as:  LASIX  Take 40 mg by mouth daily as needed for fluid (fluid).       isosorbide mononitrate 30 MG 24 hr tablet  Commonly known as:  IMDUR  TAKE 1/2 TABLET BY MOUTH DAILY.     metoprolol 50 MG tablet  Commonly known as:  LOPRESSOR  TAKE (1/2) TABLET BY MOUTH TWICE DAILY.     mupirocin ointment 2 %  Commonly known as:  BACTROBAN  Apply 1 application topically daily as needed (leg infection). Reported on 08/09/2015     NEXIUM 40 MG capsule  Generic drug:  esomeprazole  Take 40 mg by mouth daily.     NITROSTAT 0.4 MG SL tablet  Generic drug:  nitroGLYCERIN  DISSOLVE 1 TABLET UNDER TONGUE EVERY 5 MINUTES UP TO 15 MINUTES FOR CHEST PAIN. IF NO RELIEF CALL 911.     predniSONE 50 MG tablet  Commonly known as:  DELTASONE  Take 1 tablet (50 mg total) by mouth daily with breakfast.     rosuvastatin 40 MG tablet  Commonly known as:  CRESTOR  Take 40 mg by mouth daily.     sulfaDIAZINE 500 MG tablet  Take 1 tablet by mouth 2 (two) times daily. Only on weekends     VENTOLIN HFA 108 (90 Base) MCG/ACT inhaler  Generic drug:  albuterol  Inhale 2 puffs into the lungs every 6 (six) hours as needed for wheezing or shortness of breath (wheezing and shortness of breath). Reported on 08/09/2015     albuterol (2.5 MG/3ML) 0.083% nebulizer solution  Commonly known as:  PROVENTIL  Take 3 mLs (2.5 mg total) by nebulization every 4 (four) hours as needed for wheezing or shortness of breath.     vitamin B-12 100 MCG tablet  Commonly known as:  CYANOCOBALAMIN  Take 100 mcg by mouth daily.       Allergies  Allergen Reactions  . Morphine And Related Other (See Comments)    Unknown reaction   . Naproxen     Made me feel "loopy" in the head       Follow-up Information    Follow up with [facility], MD In 4 weeks.   Specialty:  Nephrology   Contact information:   [address] [place] [address] [phone]        The results of significant diagnostics from this hospitalization (including imaging, microbiology, ancillary and  laboratory) are listed below for reference.    Significant Diagnostic Studies: No results found.  Microbiology: No results found for this or any previous visit (from the past 240 hour(s)).   Labs: Basic Metabolic Panel: No results for input(s): NA, K, CL, CO2, GLUCOSE, BUN, CREATININE, CALCIUM, MG, PHOS in the last 168 hours. Liver Function Tests: No results for input(s): AST, ALT, ALKPHOS,  BILITOT, PROT, ALBUMIN in the last 168 hours. No results for input(s): LIPASE, AMYLASE in the last 168 hours. No results for input(s): AMMONIA in the last 168 hours. CBC: No results for input(s): WBC, NEUTROABS, HGB, HCT, MCV, PLT in the last 168 hours. Cardiac Enzymes: No results for input(s): CKTOTAL, CKMB, CKMBINDEX, TROPONINI in the last 168 hours. BNP: BNP (last 3 results)  Recent Labs  08/09/15 1245  BNP 294.0*    ProBNP (last 3 results) No results for input(s): PROBNP in the last 8760 hours.  CBG: No results for input(s): GLUCAP in the last 168 hours.     Signed:  Delta Deshmukh Jerilynn Mages  [facility] Pager: [phone] 10/10/2015, 12:35 PM

## 2015-10-19 IMAGING — CT CT ABD-PELV W/O CM
2 of 4 series · 16 of 46 positions shown, 18 images · non-contrast
Comparison: NM BONE WHOLE BODY dated 07/22/2012; CT CHEST-ABD-PELV
W/O CM dated 03/10/2012

CLINICAL DATA: Lymphoma.  Renal failure.

EXAM:
CT ABDOMEN AND PELVIS WITHOUT CONTRAST
TECHNIQUE: Multidetector CT imaging of the abdomen and pelvis was performed
following the standard protocol without intravenous contrast.

[Series 2: abdomen/pelvis w/o contrast · axial · non-contrast · 0.71mm/px · z∈[+526,+912]mm · 13 of 89 slices shown, 15 images]
[im 6/89  soft-tissue]
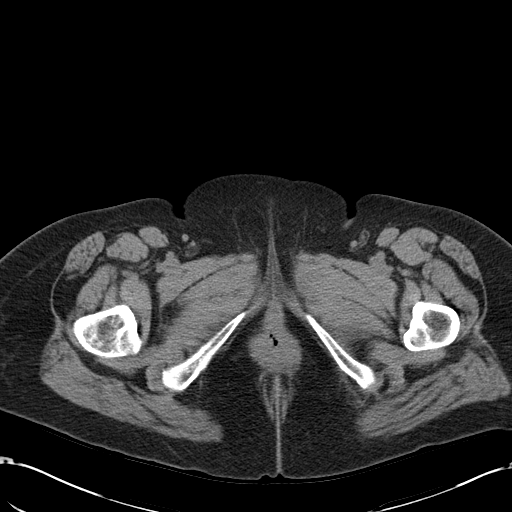
[im 6/89  bone]
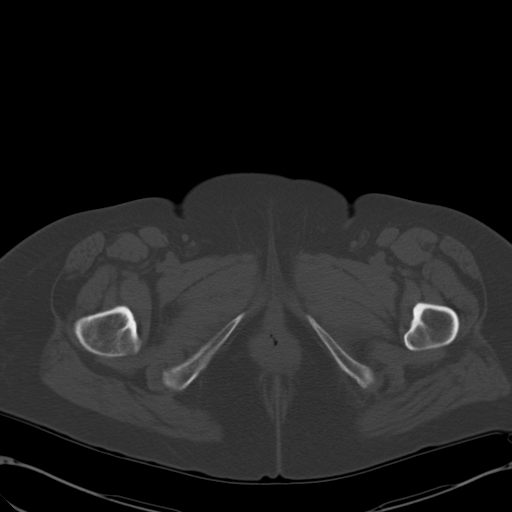
[im 12/89  soft-tissue]
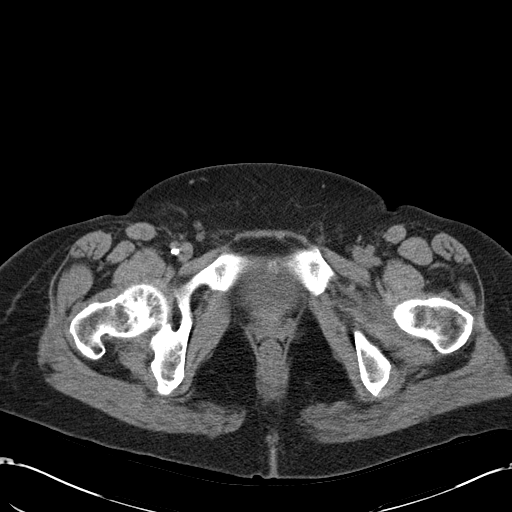
[im 18/89  soft-tissue]
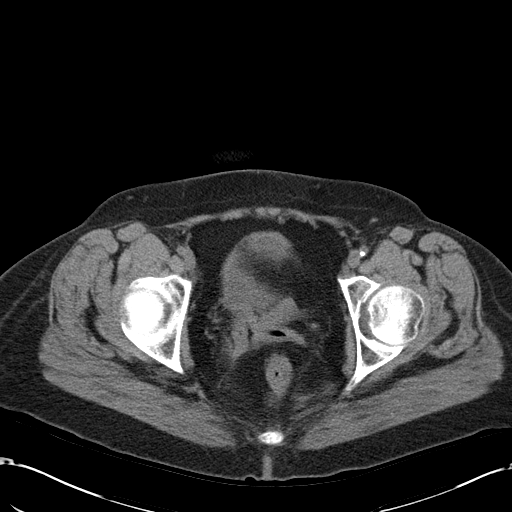
[im 24/89  soft-tissue]
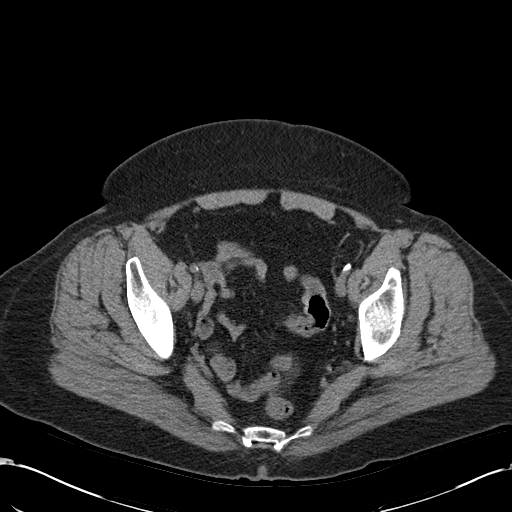
[im 30/89  soft-tissue]
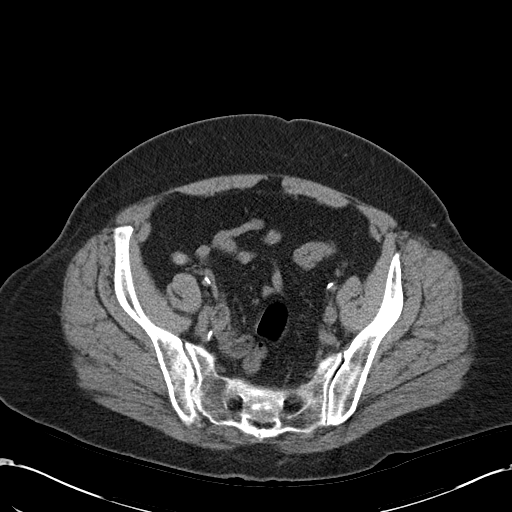
[im 36/89  soft-tissue]
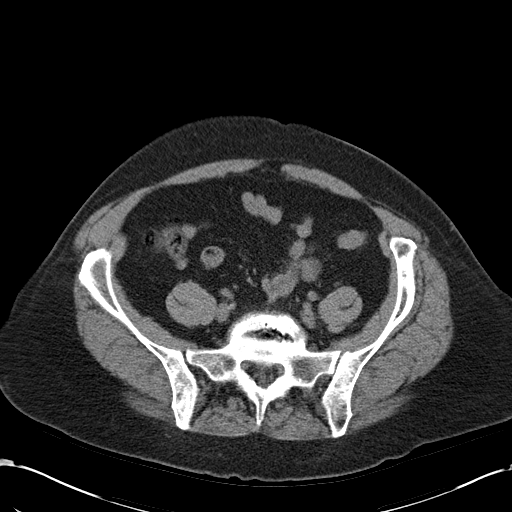
[im 47/89  soft-tissue]
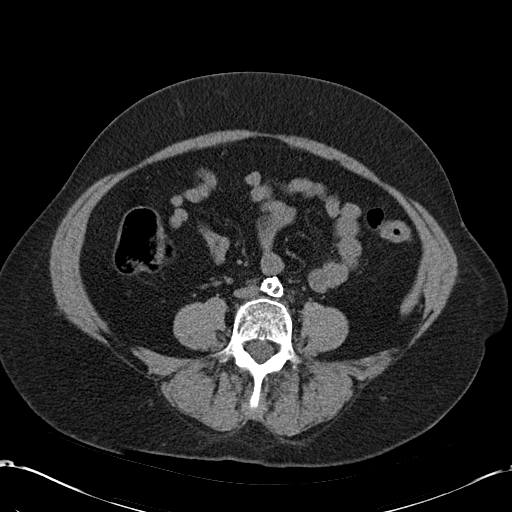
[im 53/89  soft-tissue]
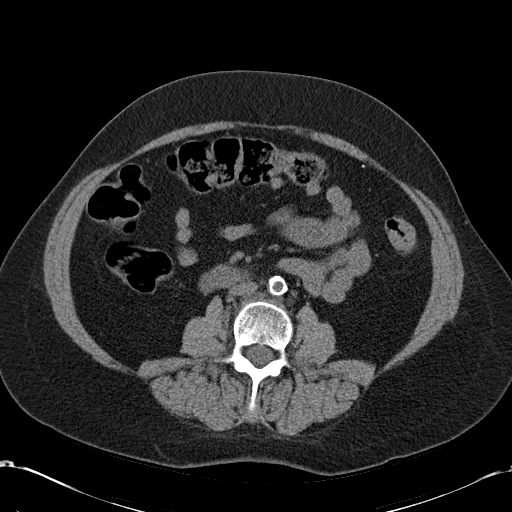
[im 59/89  soft-tissue]
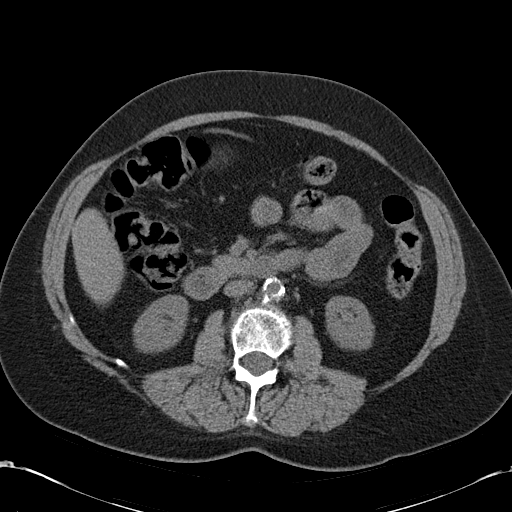
[im 59/89  bone]
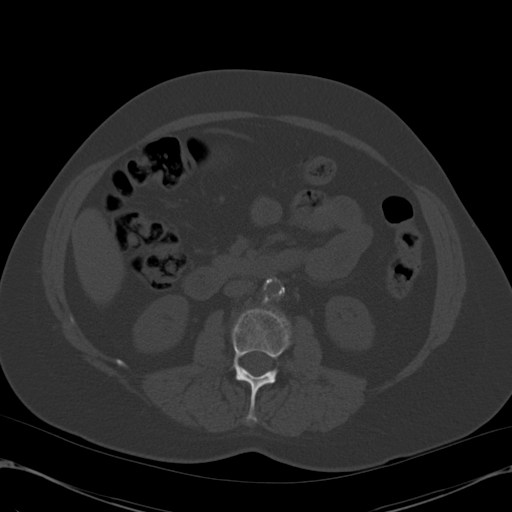
[im 65/89  soft-tissue]
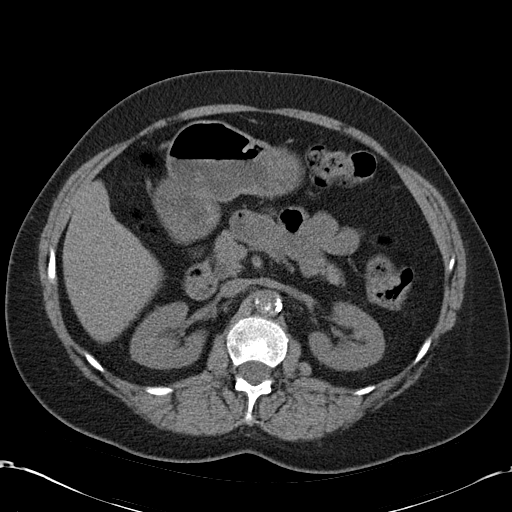
[im 71/89  soft-tissue]
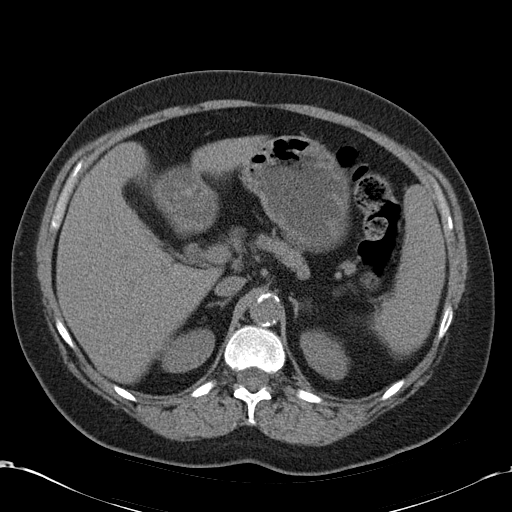
[im 77/89  soft-tissue]
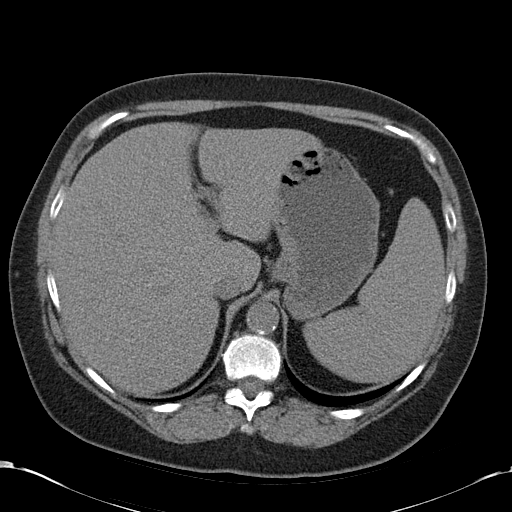
[im 83/89  soft-tissue]
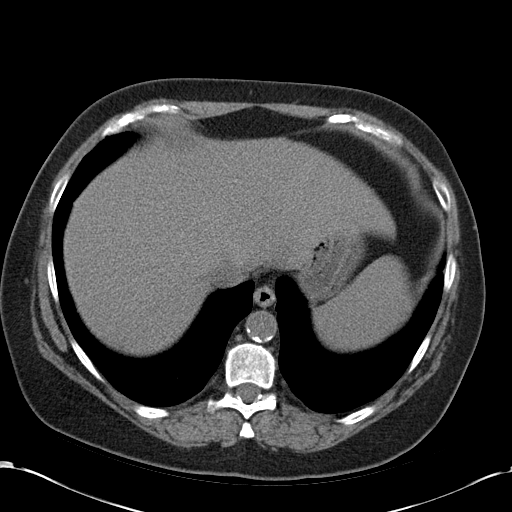

[Series 4: mpr cor (id) · coronal · 0.71mm/px · 3 of 93 slices shown]
[im 31/93  soft-tissue]
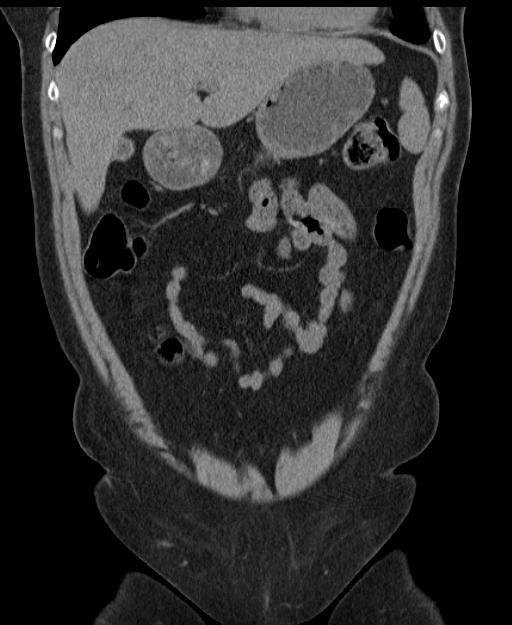
[im 41/93  soft-tissue]
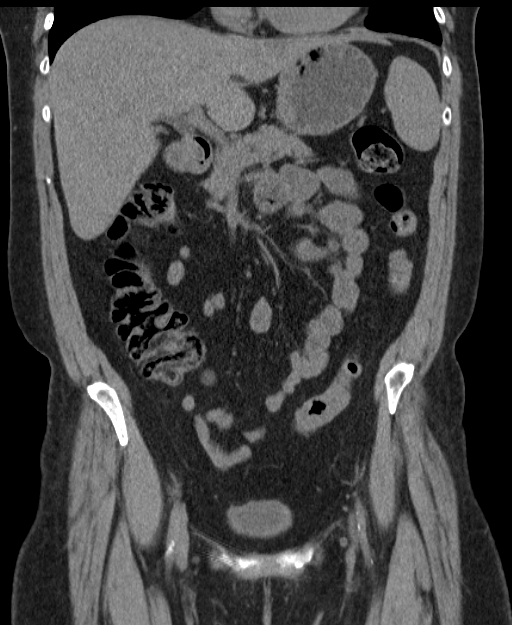
[im 52/93  soft-tissue]
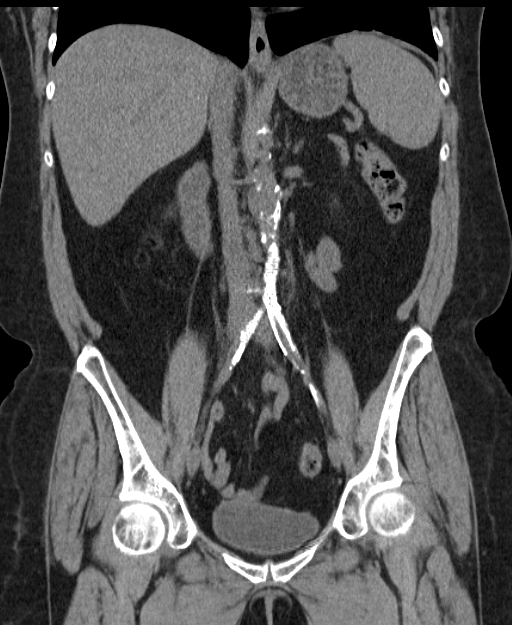

[16 of 46 positions shown; findings below may reference images not displayed]

FINDINGS: Liver normal. Mild splenic prominence. No focal splenic
abnormalities. Pancreas normal. Biliary system unremarkable.

Adrenals normal. Kidneys normal. The bladder is unremarkable.
Hysterectomy. No free pelvic fluid collections.

No significant adenopathy. Aortoiliac atherosclerotic vascular
disease.

No inflammatory changes in the right or left lower quadrant. No
bowel distention. Esophagogastric and gastroduodenal regions are
normal. No free air. No mesenteric masses.

Small patchy density is noted in the right lower lobe. This could
represent a focal area of infiltrate. A follow-up chest CT in 1
month is suggested to demonstrate clearing. Heart size normal. No
acute bony abnormality.
IMPRESSION: 1. No significant intra-abdominal abnormality identified. No
evidence of lymphoma recurrence.

2. Small patchy infiltrate right lower lobe, pneumonia cannot be
excluded. Follow-up chest CT in 1 month is suggested to demonstrate
clearing.

## 2015-12-22 ENCOUNTER — Other Ambulatory Visit: Payer: Self-pay | Admitting: Nurse Practitioner

## 2016-01-02 ENCOUNTER — Other Ambulatory Visit: Payer: Self-pay | Admitting: Cardiovascular Disease

## 2016-03-07 ENCOUNTER — Telehealth (HOSPITAL_COMMUNITY): Payer: Self-pay | Admitting: *Deleted

## 2016-03-07 NOTE — Telephone Encounter (Signed)
LEFT VOICE MESSAGE REGARDING AN APPOINTMENT. 

## 2016-03-13 ENCOUNTER — Telehealth (HOSPITAL_COMMUNITY): Payer: Self-pay | Admitting: *Deleted

## 2016-03-13 NOTE — Telephone Encounter (Signed)
phone # [phone] provided by Denman George at [name] office.  phone call to this #, left voice message for patient regarding an appointment.

## 2016-03-13 NOTE — Telephone Encounter (Signed)
left voice message at [name] office regarding wrong phone number provided for patient.  person answering the phone was not happy that her phone number was given instead of patient's number.

## 2016-03-13 NOTE — Telephone Encounter (Signed)
phone call to [phone] regarding an appointment.  The person answering the phone was not happy that her phone number was given for the patient.   she said the number given was not the patient's number.  She said the patient should have given her own #.

## 2016-03-21 ENCOUNTER — Ambulatory Visit (HOSPITAL_COMMUNITY): Payer: Medicare HMO | Admitting: Hematology & Oncology

## 2016-03-21 NOTE — Progress Notes (Signed)
This encounter was created in error - please disregard.

## 2016-05-02 ENCOUNTER — Ambulatory Visit (HOSPITAL_COMMUNITY): Payer: Medicare HMO | Admitting: Hematology & Oncology

## 2016-05-03 NOTE — Progress Notes (Signed)
This encounter was created in error - please disregard.

## 2016-08-07 DIAGNOSIS — I119 Hypertensive heart disease without heart failure: Secondary | ICD-10-CM | POA: Diagnosis not present

## 2016-08-07 DIAGNOSIS — C8580 Other specified types of non-Hodgkin lymphoma, unspecified site: Secondary | ICD-10-CM | POA: Diagnosis not present

## 2016-08-07 DIAGNOSIS — J449 Chronic obstructive pulmonary disease, unspecified: Secondary | ICD-10-CM | POA: Diagnosis not present

## 2016-08-07 DIAGNOSIS — I739 Peripheral vascular disease, unspecified: Secondary | ICD-10-CM | POA: Diagnosis not present

## 2016-08-22 DIAGNOSIS — B078 Other viral warts: Secondary | ICD-10-CM | POA: Diagnosis not present

## 2016-08-22 DIAGNOSIS — L570 Actinic keratosis: Secondary | ICD-10-CM | POA: Diagnosis not present

## 2016-08-22 DIAGNOSIS — X32XXXA Exposure to sunlight, initial encounter: Secondary | ICD-10-CM | POA: Diagnosis not present

## 2016-08-22 DIAGNOSIS — D045 Carcinoma in situ of skin of trunk: Secondary | ICD-10-CM | POA: Diagnosis not present

## 2016-09-19 DIAGNOSIS — Z1283 Encounter for screening for malignant neoplasm of skin: Secondary | ICD-10-CM | POA: Diagnosis not present

## 2016-09-19 DIAGNOSIS — Z08 Encounter for follow-up examination after completed treatment for malignant neoplasm: Secondary | ICD-10-CM | POA: Diagnosis not present

## 2016-09-19 DIAGNOSIS — L821 Other seborrheic keratosis: Secondary | ICD-10-CM | POA: Diagnosis not present

## 2016-09-19 DIAGNOSIS — Z85828 Personal history of other malignant neoplasm of skin: Secondary | ICD-10-CM | POA: Diagnosis not present

## 2016-09-19 DIAGNOSIS — D225 Melanocytic nevi of trunk: Secondary | ICD-10-CM | POA: Diagnosis not present

## 2016-09-19 DIAGNOSIS — D485 Neoplasm of uncertain behavior of skin: Secondary | ICD-10-CM | POA: Diagnosis not present

## 2016-09-19 DIAGNOSIS — L570 Actinic keratosis: Secondary | ICD-10-CM | POA: Diagnosis not present

## 2016-09-19 DIAGNOSIS — X32XXXD Exposure to sunlight, subsequent encounter: Secondary | ICD-10-CM | POA: Diagnosis not present

## 2016-11-05 DIAGNOSIS — J441 Chronic obstructive pulmonary disease with (acute) exacerbation: Secondary | ICD-10-CM | POA: Diagnosis not present

## 2016-11-05 DIAGNOSIS — I739 Peripheral vascular disease, unspecified: Secondary | ICD-10-CM | POA: Diagnosis not present

## 2016-11-05 DIAGNOSIS — F17200 Nicotine dependence, unspecified, uncomplicated: Secondary | ICD-10-CM | POA: Diagnosis not present

## 2016-11-05 DIAGNOSIS — I119 Hypertensive heart disease without heart failure: Secondary | ICD-10-CM | POA: Diagnosis not present

## 2016-12-16 ENCOUNTER — Emergency Department (HOSPITAL_COMMUNITY)
Admission: EM | Admit: 2016-12-16 | Discharge: 2016-12-16 | Disposition: A | Discharge status: Home / Self Care | Payer: PPO | Attending: Emergency Medicine | Admitting: Emergency Medicine

## 2016-12-16 ENCOUNTER — Encounter (HOSPITAL_COMMUNITY): Payer: Self-pay | Admitting: Emergency Medicine

## 2016-12-16 DIAGNOSIS — Z885 Allergy status to narcotic agent status: Secondary | ICD-10-CM | POA: Diagnosis not present

## 2016-12-16 DIAGNOSIS — M25522 Pain in left elbow: Secondary | ICD-10-CM | POA: Diagnosis not present

## 2016-12-16 DIAGNOSIS — I110 Hypertensive heart disease with heart failure: Secondary | ICD-10-CM | POA: Diagnosis not present

## 2016-12-16 DIAGNOSIS — F17210 Nicotine dependence, cigarettes, uncomplicated: Secondary | ICD-10-CM | POA: Diagnosis not present

## 2016-12-16 DIAGNOSIS — Z79899 Other long term (current) drug therapy: Secondary | ICD-10-CM | POA: Diagnosis not present

## 2016-12-16 DIAGNOSIS — J45909 Unspecified asthma, uncomplicated: Secondary | ICD-10-CM | POA: Diagnosis not present

## 2016-12-16 DIAGNOSIS — I252 Old myocardial infarction: Secondary | ICD-10-CM | POA: Insufficient documentation

## 2016-12-16 DIAGNOSIS — L03114 Cellulitis of left upper limb: Secondary | ICD-10-CM | POA: Diagnosis not present

## 2016-12-16 DIAGNOSIS — I509 Heart failure, unspecified: Secondary | ICD-10-CM | POA: Diagnosis not present

## 2016-12-16 DIAGNOSIS — I25119 Atherosclerotic heart disease of native coronary artery with unspecified angina pectoris: Secondary | ICD-10-CM | POA: Diagnosis not present

## 2016-12-16 DIAGNOSIS — J449 Chronic obstructive pulmonary disease, unspecified: Secondary | ICD-10-CM | POA: Insufficient documentation

## 2016-12-16 MED ORDER — CEPHALEXIN 500 MG PO CAPS: 500.0000 mg | ORAL_CAPSULE | Freq: Four times a day (QID) | ORAL | 0 refills | Status: DC

## 2016-12-16 MED ORDER — TRAMADOL HCL 50 MG PO TABS
50.0000 mg | ORAL_TABLET | Freq: Once | ORAL | Status: DC
  Filled 2016-12-16: qty 1

## 2016-12-16 MED ORDER — CEPHALEXIN 500 MG PO CAPS
500.0000 mg | ORAL_CAPSULE | Freq: Once | ORAL | Status: AC
  Administered 2016-12-16: 500 mg via ORAL
  Filled 2016-12-16: qty 1

## 2016-12-16 NOTE — ED Provider Notes (Signed)
[place] DEPT Provider Note   CSN: [id number] Arrival date & time: 12/16/16  1414     History   Chief Complaint Chief Complaint  Patient presents with  . Arm Pain    HPI [name] is a 61 y.o. female.  HPI   61 year old female with left elbow pain. 4 days ago she struck her elbow into the dashboard of her vehicle. Just sustained a small abrasion in this area. Some mild pain initially would seem to be improving. In the past 24-48 hours she began having increasing pain and redness at the same site. No drainage. No fevers or chills. Her pain is worse with range of motion.  Past Medical History:  Diagnosis Date  . Anemia   . Anginal pain ([place])    occ last time last wk  . Anxiety   . Arthritis   . Asthma   . Atherosclerosis of native arteries of the extremities with rest pain 12/10/2008   Qualifier: Diagnosis of  By: Owens Shark, RN, BSN, Lauren    . Blood transfusion   . Cancer (HCC)    hx of non hogdin lymphoma  . Carotid artery disease (HCC)    hx of carotid cerebrovascular disease with 0-39% bilateral internal carotid artery setenosis   . CHF (congestive heart failure) ([place])   . Chronic back pain    status post lumbar surgery  . COPD 10/27/2008   Qualifier: Diagnosis of  By: Owens Shark, RN, BSN, Lauren    . COPD (chronic obstructive pulmonary disease) ([place])   . Coronary artery disease    status post stenting of the aortic and iliac vessels by Dr. Gwenlyn Found. Repeat aortic stenting 2010  . Depression   . Dysrhythmia    ?  [name] GERD (gastroesophageal reflux disease)   . History of kidney stones   . Hyperlipidemia   . Hypertension   . Langerhans cell histiocytosis of lung, 2009 11/10/2013  . Left ventricular dysfunction    hx of with ejection fraction of 35% range.  . Lymphocytic lymphoma ([place]) 12/24/2013  . Myocardial infarction ([place])   . Non Hodgkin's lymphoma ([place])   . Non-Hodgkin lymphoma, B-cell, low grade 12/24/2013  . Osteoporosis   . Other malignant lymphomas,  unspecified site, extranodal and solid organ sites 2004   Back  . Peripheral vascular disease ([place])   . Pneumonia    hx  . PVD 10/27/2008   Qualifier: Diagnosis of  By: Owens Shark, RN, BSN, Lauren    . Seizures ([place])    ? yrs ago  . Shortness of breath   . Stroke ([place]) 04  . Vulvar cancer [facility])     Patient Active Problem List   Diagnosis Date Noted  . Malnutrition of moderate degree 08/20/2015  . Encephalopathy 08/18/2015  . COPD (chronic obstructive pulmonary disease) ([place]) 08/18/2015  . Protein-calorie malnutrition, severe 08/11/2015  . HCAP (healthcare-associated pneumonia) 08/09/2015  . Acute respiratory failure with hypoxia ([place]) 08/09/2015  . COPD with exacerbation ([place]) 08/09/2015  . Acute kidney injury ([place]) 08/09/2015  . Hypokalemia 08/09/2015  . Hyponatremia 08/09/2015  . Lymphocytic lymphoma ([place]) 08/09/2015  . Follicular lymphoma grade I of extranodal and solid organ sites ([place]) 12/24/2013  . Langerhans cell histiocytosis of lung, 2009 11/10/2013  . 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 Vulvar cancer ([place]) 05/02/2012  . AORTIC ATHEROSCLEROSIS 08/12/2009  . ATHEROSCLEROSIS, RENAL ARTERY 08/12/2009  . Atherosclerosis of native arteries of the extremities with rest pain 12/10/2008  . HYPERLIPIDEMIA 10/27/2008  .  HYPERTENSION, BENIGN 10/27/2008  . CAD, NATIVE VESSEL 10/27/2008  . LEFT VENTRICULAR FUNCTION, DECREASED 10/27/2008  . CAROTID ARTERY DISEASE 10/27/2008  . PVD 10/27/2008  . COPD 10/27/2008  . NON-HODGKIN'S LYMPHOMA, HX OF 10/27/2008    Past Surgical History:  Procedure Laterality Date  . ABDOMINAL AORTAGRAM N/A 08/08/2011   Procedure: ABDOMINAL Maxcine Ham;  Surgeon: Sherren Mocha, MD;  Location: [facility] CATH LAB;  Service: Cardiovascular;  Laterality: N/A;  . abdominal aortogram  08/08/2011  . ABDOMINAL HYSTERECTOMY     partial  . BACK SURGERY     x 2  . CARDIAC CATHETERIZATION    . DILATION AND CURETTAGE OF UTERUS    . lumar surg      . LUNG BIOPSY    . PERCUTANEOUS STENT INTERVENTION Bilateral 08/08/2011   Procedure: PERCUTANEOUS STENT INTERVENTION;  Surgeon: Sherren Mocha, MD;  Location: [facility] CATH LAB;  Service: Cardiovascular;  Laterality: Bilateral;  . PORTACATH PLACEMENT     pt says its for chemo only and its an old port(04)   . VIDEO ASSISTED THORACOSCOPY (VATS)/WEDGE RESECTION Right 12/02/2013   Procedure: VIDEO ASSISTED THORACOSCOPY (VATS)/WEDGE RESECTION;  Surgeon: Melrose Nakayama, MD;  Location: [facility];  Service: Thoracic;  Laterality: Right;  [place] Dana Milagros Loll BIOPSY  04/09/2012   Procedure: VULVAR BIOPSY;  Surgeon: Florian Buff, MD;  Location: [facility] ORS;  Service: Gynecology;  Laterality: N/A;  . VULVECTOMY  05/20/2012   Procedure: VULVECTOMY;  Surgeon: Alvino Chapel, MD;  Location: [facility] ORS;  Service: Gynecology;  Laterality: Bilateral;  BILATERAL SIMPLE VULVECTOMIES    OB History    No data available       Home Medications    Prior to Admission medications   Medication Sig Start Date End Date Taking? Authorizing Provider  acetaminophen (TYLENOL) 500 MG tablet Take 1,000 mg by mouth every 6 (six) hours as needed for moderate pain or headache.   Yes [provider]  albuterol (PROVENTIL) (2.5 MG/3ML) 0.083% nebulizer solution Take 3 mLs (2.5 mg total) by nebulization every 4 (four) hours as needed for wheezing or shortness of breath. 08/17/15  Yes Lucia Gaskins, MD  albuterol (VENTOLIN HFA) 108 (90 BASE) MCG/ACT inhaler Inhale 2 puffs into the lungs every 6 (six) hours as needed for wheezing or shortness of breath (wheezing and shortness of breath). Reported on 08/09/2015   Yes [provider]  ALPRAZolam Duanne Moron) 1 MG tablet Take 0.5 tablets (0.5 mg total) by mouth 3 (three) times daily as needed for anxiety or sleep (sleep). You can cut your current tablets in half Patient taking differently: Take 0.5 mg by mouth 3 (three) times daily as needed for anxiety or sleep (sleep). You can  cut your current tablets in half 08/24/15  Yes Sinda Du, MD  BREO ELLIPTA 100-25 MCG/INH AEPB Take 1 Inhaler by mouth daily.  07/06/15  Yes [provider]  Multiple Vitamin (MULTIVITAMIN WITH MINERALS) TABS tablet Take 1 tablet by mouth daily.   Yes [provider]  mupirocin ointment (BACTROBAN) 2 % Apply 1 application topically daily as needed (leg infection). Reported on 08/09/2015 03/13/12  Yes [provider]  nitroGLYCERIN (NITROSTAT) 0.4 MG SL tablet DISSOLVE 1 TABLET UNDER TONGUE EVERY 5 MINUTES UP TO 15 MINUTES FOR CHEST PAIN. IF NO RELIEF CALL 911. 01/02/16  Yes Sherren Mocha, MD  oxyCODONE (ROXICODONE) 15 MG immediate release tablet Take 15 mg by mouth every 6 (six) hours as needed for pain.   Yes [provider]  oxyCODONE 30  MG 12 hr tablet Take 1 tablet by mouth 2 (two) times daily.   Yes [provider]  Polyvinyl Alcohol-Povidone (CLEAR EYES ALL SEASONS OP) Place 1-2 drops into both eyes daily as needed.   Yes [provider]  traZODone (DESYREL) 100 MG tablet Take 100 mg by mouth at bedtime. 11/29/16  Yes [provider]  triamcinolone cream (KENALOG) 0.1 % Apply 1 application topically daily as needed. 09/19/16  Yes [provider]  cephALEXin (KEFLEX) 500 MG capsule Take 1 capsule (500 mg total) by mouth 4 (four) times daily. 12/16/16   Virgel Manifold, MD    Family History Family History  Problem Relation Age of Onset  . Heart disease Mother   . Lung cancer Mother   . Heart attack Mother   . Heart failure Mother   . Heart disease Father   . Kidney cancer Father   . Heart attack Father   . Heart failure Father   . Diabetes Sister   . Aortic aneurysm Sister   . Heart attack Brother   . Coronary artery disease Unknown        positive for vascular and cardiac disease  . Asthma Son   . Sleep apnea Son   . Endometriosis Daughter     Social History Social History  Substance Use Topics  . Smoking  status: Current Every Day Smoker    Packs/day: 2.50    Years: 40.00    Types: Cigarettes  . Smokeless tobacco: Never Used  . Alcohol use No     Allergies   Morphine and related and Naproxen   Review of Systems Review of Systems  All systems reviewed and negative, other than as noted in HPI.   Physical Exam Updated Vital Signs BP 102/63 (BP Location: Left Arm)   Pulse 77   Temp 98.3 F (36.8 C) (Oral)   Resp 16   Ht 5\' 4"  (1.626 m)   Wt 49.9 kg (110 lb)   SpO2 98%   BMI 18.88 kg/m   Physical Exam  Constitutional: She appears well-developed and well-nourished. No distress.  HENT:  Head: Normocephalic and atraumatic.  Eyes: Conjunctivae are normal. Right eye exhibits no discharge. Left eye exhibits no discharge.  Neck: Neck supple.  Cardiovascular: Normal rate, regular rhythm and normal heart sounds.  Exam reveals no gallop and no friction rub.   No murmur heard. Pulmonary/Chest: Effort normal and breath sounds normal. No respiratory distress.  Abdominal: Soft. She exhibits no distension. There is no tenderness.  Musculoskeletal: She exhibits no edema or tenderness.  Neurological: [name] alert.  Skin: Skin is warm and dry.  Mild erythema, increased warmth and tenderness of the left elbow and along the ulnar aspect of the proximal left forearm. This is not an olecranon bursitis.Patient can actively range her left elbow and can fully pronate/supinate. No abscess. No drainage. Neurovascularly intact distally.  Psychiatric: She has a normal mood and affect. Her behavior is normal. Thought content normal.  Nursing note and vitals reviewed.    ED Treatments / Results  Labs (all labs ordered are listed, but only abnormal results are displayed) Labs Reviewed - No data to display  EKG  EKG Interpretation None       Radiology No results found.  Procedures Procedures (including critical care time)  Medications Ordered in ED Medications  traMADol (ULTRAM)  tablet 50 mg (50 mg Oral Refused 12/16/16 1607)  cephALEXin (KEFLEX) capsule 500 mg (500 mg Oral Given 12/16/16 1607)  Initial Impression / Assessment and Plan / ED Course  I have reviewed the triage vital signs and the nursing notes.  Pertinent labs & imaging results that were available during my care of the patient were reviewed by me and considered in my medical decision making (see chart for details).    61 year old female with left elbow. Clinically this is cellulitis. Only mild pain initially which was improving before worsening again. I doubt acute osseous injury. I doubt septic joint. [name] afebrile. No systemic symptoms. I feel she is appropriate for outpatient treatment. Return precautions were discussed.  Final Clinical Impressions(s) / ED Diagnoses   Final diagnoses:  Cellulitis of left upper extremity    New Prescriptions Discharge Medication List as of 12/16/2016  4:07 PM    START taking these medications   Details  cephALEXin (KEFLEX) 500 MG capsule Take 1 capsule (500 mg total) by mouth 4 (four) times daily., Starting Sun 12/16/2016, Print         Virgel Manifold, MD 12/16/16 [phone]

## 2016-12-16 NOTE — ED Triage Notes (Signed)
Pt reports hitting her L elbow on a console 4 days ago Since yesterday, redness, pain and swelling   Dr Luan Pulling

## 2016-12-16 NOTE — ED Notes (Signed)
Pts left elbow red, tender and warm to touch x 4 days. Pt reports hitting elbow on console

## 2016-12-16 NOTE — ED Triage Notes (Signed)
Patient states she scraped her left arm on car door 4 days ago. Left elbow is now red and inflamed.

## 2017-02-05 DIAGNOSIS — F419 Anxiety disorder, unspecified: Secondary | ICD-10-CM | POA: Diagnosis not present

## 2017-02-05 DIAGNOSIS — J449 Chronic obstructive pulmonary disease, unspecified: Secondary | ICD-10-CM | POA: Diagnosis not present

## 2017-02-05 DIAGNOSIS — F17200 Nicotine dependence, unspecified, uncomplicated: Secondary | ICD-10-CM | POA: Diagnosis not present

## 2017-02-05 DIAGNOSIS — I119 Hypertensive heart disease without heart failure: Secondary | ICD-10-CM | POA: Diagnosis not present

## 2017-05-03 DIAGNOSIS — Z79891 Long term (current) use of opiate analgesic: Secondary | ICD-10-CM | POA: Diagnosis not present

## 2017-05-03 DIAGNOSIS — Z23 Encounter for immunization: Secondary | ICD-10-CM | POA: Diagnosis not present

## 2017-05-07 DIAGNOSIS — Z23 Encounter for immunization: Secondary | ICD-10-CM | POA: Diagnosis not present

## 2017-05-07 DIAGNOSIS — J449 Chronic obstructive pulmonary disease, unspecified: Secondary | ICD-10-CM | POA: Diagnosis not present

## 2017-05-07 DIAGNOSIS — M545 Low back pain: Secondary | ICD-10-CM | POA: Diagnosis not present

## 2017-05-07 DIAGNOSIS — I739 Peripheral vascular disease, unspecified: Secondary | ICD-10-CM | POA: Diagnosis not present

## 2017-05-07 DIAGNOSIS — I119 Hypertensive heart disease without heart failure: Secondary | ICD-10-CM | POA: Diagnosis not present

## 2017-07-11 ENCOUNTER — Ambulatory Visit (HOSPITAL_COMMUNITY)
Admission: RE | Admit: 2017-07-11 | Discharge: 2017-07-11 | Disposition: A | Discharge status: Home / Self Care | Payer: PPO | Source: Ambulatory Visit | Attending: Pulmonary Disease | Admitting: Pulmonary Disease

## 2017-07-11 ENCOUNTER — Other Ambulatory Visit (HOSPITAL_COMMUNITY): Payer: Self-pay | Admitting: Pulmonary Disease

## 2017-07-11 DIAGNOSIS — M25511 Pain in right shoulder: Secondary | ICD-10-CM | POA: Insufficient documentation

## 2017-07-25 ENCOUNTER — Other Ambulatory Visit (HOSPITAL_COMMUNITY): Payer: Self-pay | Admitting: Pulmonary Disease

## 2017-07-25 DIAGNOSIS — M25511 Pain in right shoulder: Secondary | ICD-10-CM

## 2017-07-30 ENCOUNTER — Ambulatory Visit (HOSPITAL_COMMUNITY)
Admission: RE | Admit: 2017-07-30 | Discharge: 2017-07-30 | Disposition: A | Discharge status: Home / Self Care | Payer: PPO | Source: Ambulatory Visit | Attending: Pulmonary Disease | Admitting: Pulmonary Disease

## 2017-07-30 DIAGNOSIS — M12811 Other specific arthropathies, not elsewhere classified, right shoulder: Secondary | ICD-10-CM | POA: Diagnosis not present

## 2017-07-30 DIAGNOSIS — M25511 Pain in right shoulder: Secondary | ICD-10-CM | POA: Diagnosis not present

## 2017-07-30 DIAGNOSIS — M67411 Ganglion, right shoulder: Secondary | ICD-10-CM | POA: Insufficient documentation

## 2017-08-16 DIAGNOSIS — I10 Essential (primary) hypertension: Secondary | ICD-10-CM | POA: Diagnosis not present

## 2017-08-16 DIAGNOSIS — Z79899 Other long term (current) drug therapy: Secondary | ICD-10-CM | POA: Diagnosis not present

## 2017-08-16 DIAGNOSIS — X58XXXA Exposure to other specified factors, initial encounter: Secondary | ICD-10-CM | POA: Diagnosis not present

## 2017-08-16 DIAGNOSIS — Z79891 Long term (current) use of opiate analgesic: Secondary | ICD-10-CM | POA: Diagnosis not present

## 2017-08-16 DIAGNOSIS — F17200 Nicotine dependence, unspecified, uncomplicated: Secondary | ICD-10-CM | POA: Diagnosis not present

## 2017-08-16 DIAGNOSIS — J449 Chronic obstructive pulmonary disease, unspecified: Secondary | ICD-10-CM | POA: Diagnosis not present

## 2017-08-16 DIAGNOSIS — E7800 Pure hypercholesterolemia, unspecified: Secondary | ICD-10-CM | POA: Diagnosis not present

## 2017-08-16 DIAGNOSIS — Z859 Personal history of malignant neoplasm, unspecified: Secondary | ICD-10-CM | POA: Diagnosis not present

## 2017-08-16 DIAGNOSIS — Z7901 Long term (current) use of anticoagulants: Secondary | ICD-10-CM | POA: Diagnosis not present

## 2017-08-16 DIAGNOSIS — Z7902 Long term (current) use of antithrombotics/antiplatelets: Secondary | ICD-10-CM | POA: Diagnosis not present

## 2017-08-16 DIAGNOSIS — S46911A Strain of unspecified muscle, fascia and tendon at shoulder and upper arm level, right arm, initial encounter: Secondary | ICD-10-CM | POA: Diagnosis not present

## 2017-08-16 DIAGNOSIS — I252 Old myocardial infarction: Secondary | ICD-10-CM | POA: Diagnosis not present

## 2017-09-02 DIAGNOSIS — J449 Chronic obstructive pulmonary disease, unspecified: Secondary | ICD-10-CM | POA: Diagnosis not present

## 2017-09-02 DIAGNOSIS — I25119 Atherosclerotic heart disease of native coronary artery with unspecified angina pectoris: Secondary | ICD-10-CM | POA: Diagnosis not present

## 2017-09-02 DIAGNOSIS — I10 Essential (primary) hypertension: Secondary | ICD-10-CM | POA: Diagnosis not present

## 2017-09-02 DIAGNOSIS — M545 Low back pain: Secondary | ICD-10-CM | POA: Diagnosis not present

## 2017-09-19 ENCOUNTER — Other Ambulatory Visit (HOSPITAL_COMMUNITY): Payer: Self-pay | Admitting: Pulmonary Disease

## 2017-09-19 DIAGNOSIS — Z1231 Encounter for screening mammogram for malignant neoplasm of breast: Secondary | ICD-10-CM

## 2017-09-25 ENCOUNTER — Ambulatory Visit (HOSPITAL_COMMUNITY): Admission: RE | Admit: 2017-09-25 | Payer: PPO | Source: Ambulatory Visit

## 2017-10-03 ENCOUNTER — Ambulatory Visit (HOSPITAL_COMMUNITY)
Admission: RE | Admit: 2017-10-03 | Discharge: 2017-10-03 | Disposition: A | Discharge status: Home / Self Care | Payer: PPO | Source: Ambulatory Visit | Attending: Pulmonary Disease | Admitting: Pulmonary Disease

## 2017-10-03 DIAGNOSIS — Z1231 Encounter for screening mammogram for malignant neoplasm of breast: Secondary | ICD-10-CM | POA: Diagnosis not present

## 2017-10-09 ENCOUNTER — Ambulatory Visit: Payer: PPO | Admitting: Orthopedic Surgery

## 2017-10-09 ENCOUNTER — Ambulatory Visit (INDEPENDENT_AMBULATORY_CARE_PROVIDER_SITE_OTHER): Payer: PPO

## 2017-10-09 ENCOUNTER — Encounter: Payer: Self-pay | Admitting: Orthopedic Surgery

## 2017-10-09 VITALS — BP 89/59 | HR 87 | Ht 64.0 in | Wt 112.0 lb

## 2017-10-09 DIAGNOSIS — M542 Cervicalgia: Secondary | ICD-10-CM

## 2017-10-09 DIAGNOSIS — S13100A Subluxation of unspecified cervical vertebrae, initial encounter: Secondary | ICD-10-CM | POA: Diagnosis not present

## 2017-10-09 NOTE — Progress Notes (Signed)
Progress Note   Patient ID: [name], female   DOB: 01/24/1956, 62 y.o.   MRN: [id number]  Chief Complaint  Patient presents with  . New Patient (Initial Visit)    right shoulder and neck pain    62 year old female complains of pain in right shoulder for 4 months  She had an MRI of her right shoulder and an x-ray of the right shoulder she has tendinitis of the rotator cuff  However, today she says that her neck hurts her right arm goes numb and tingles starting from the neck radiating down into the right hand with weakness in the right upper extremity  She also complains of severe sharp pain in the periscapular region  She denies any trauma    Review of Systems  Constitutional: Negative for chills, fever and weight loss.  Musculoskeletal: Positive for back pain and neck pain.  Neurological: Positive for tingling, sensory change and weakness.   Current Meds  Medication Sig  . acetaminophen (TYLENOL) 500 MG tablet Take 1,000 mg by mouth every 6 (six) hours as needed for moderate pain or headache.  . albuterol (PROVENTIL) (2.5 MG/3ML) 0.083% nebulizer solution Take 3 mLs (2.5 mg total) by nebulization every 4 (four) hours as needed for wheezing or shortness of breath.  [name] albuterol (VENTOLIN HFA) 108 (90 BASE) MCG/ACT inhaler Inhale 2 puffs into the lungs every 6 (six) hours as needed for wheezing or shortness of breath (wheezing and shortness of breath). Reported on 08/09/2015  . ALPRAZolam (XANAX) 1 MG tablet Take 0.5 tablets (0.5 mg total) by mouth 3 (three) times daily as needed for anxiety or sleep (sleep). You can cut your current tablets in half (Patient taking differently: Take 0.5 mg by mouth 3 (three) times daily as needed for anxiety or sleep (sleep). You can cut your current tablets in half)  . BREO ELLIPTA 100-25 MCG/INH AEPB Take 1 Inhaler by mouth daily.   [name] gabapentin (NEURONTIN) 300 MG capsule Take 300 mg by mouth 3 (three) times daily.  [name] ibuprofen (ADVIL,MOTRIN)  200 MG tablet Take 200 mg by mouth every 6 (six) hours as needed.  . Multiple Vitamin (MULTIVITAMIN WITH MINERALS) TABS tablet Take 1 tablet by mouth daily.  . mupirocin ointment (BACTROBAN) 2 % Apply 1 application topically daily as needed (leg infection). Reported on 08/09/2015  . nitroGLYCERIN (NITROSTAT) 0.4 MG SL tablet DISSOLVE 1 TABLET UNDER TONGUE EVERY 5 MINUTES UP TO 15 MINUTES FOR CHEST PAIN. IF NO RELIEF CALL 911.  [name] oxyCODONE (ROXICODONE) 15 MG immediate release tablet Take 15 mg by mouth every 6 (six) hours as needed for pain.  [name] oxyCODONE 30 MG 12 hr tablet Take 1 tablet by mouth 2 (two) times daily.  [name] PARoxetine (PAXIL) 10 MG tablet Take 10 mg by mouth daily.  . Polyvinyl Alcohol-Povidone (CLEAR EYES ALL SEASONS OP) Place 1-2 drops into both eyes daily as needed.  . traZODone (DESYREL) 100 MG tablet Take 100 mg by mouth at bedtime.  . triamcinolone cream (KENALOG) 0.1 % Apply 1 application topically daily as needed.    Past Medical History:  Diagnosis Date  . Anemia   . Anginal pain ([place])    occ last time last wk  . Anxiety   . Arthritis   . Asthma   . Atherosclerosis of native arteries of the extremities with rest pain 12/10/2008   Qualifier: Diagnosis of  By: Owens Shark, RN, BSN, Lauren    . Blood transfusion   . Cancer ([place])  hx of non hogdin lymphoma  . Carotid artery disease (H[facility])    hx of carotid cerebrovascular disease with 0-39% bilateral internal carotid artery setenosis   . CHF (congestive heart failure) ([place])   . Chronic back pain    status post lumbar surgery  . COPD 10/27/2008   Qualifier: Diagnosis of  By: Owens Shark, RN, BSN, Lauren    . COPD (chronic obstructive pulmonary disease) ([place])   . Coronary artery disease    status post stenting of the aortic and iliac vessels by Dr. Gwenlyn Found. Repeat aortic stenting 2010  . Depression   . Diabetes mellitus without complication ([place])   . Dysrhythmia    ?  [name] GERD (gastroesophageal reflux disease)   . History of kidney  stones   . Hyperlipidemia   . Hypertension   . Langerhans cell histiocytosis of lung, 2009 11/10/2013  . Left ventricular dysfunction    hx of with ejection fraction of 35% range.  . Lymphocytic lymphoma ([place]) 12/24/2013  . Myocardial infarction ([place])   . Non Hodgkin's lymphoma ([place])   . Non-Hodgkin lymphoma, B-cell, low grade 12/24/2013  . Osteoporosis   . Other malignant lymphomas, unspecified site, extranodal and solid organ sites 2004   Back  . Peripheral vascular disease ([place])   . Pneumonia    hx  . PVD 10/27/2008   Qualifier: Diagnosis of  By: Owens Shark, RN, BSN, Lauren    . Seizures ([place])    ? yrs ago  . Shortness of breath   . Stroke ([place]) 04  . Vulvar cancer ([place])      Allergies  Allergen Reactions  . Morphine And Related Other (See Comments)    Unknown reaction   . Naproxen     Made me feel "loopy" in the head    BP (!) 89/59   Pulse 87   Ht 5\' 4"  (1.626 m)   Wt 112 lb (50.8 kg)   BMI 19.22 kg/m    Physical Exam  Constitutional: [name] oriented to person, place, and time. She appears well-developed and well-nourished.  Very thin ectomorphic body habitus  Neck: No tracheal deviation present. No thyromegaly present.  Cardiovascular: Intact distal pulses.  Lymphadenopathy:    She has no cervical adenopathy.  Neurological: [name] alert and oriented to person, place, and time.  Skin: Skin is warm and dry. Capillary refill takes less than 2 seconds. No rash noted. No erythema. No pallor.  Multiple areas of subcutaneous ecchymosis  Psychiatric: She has a normal mood and affect. Judgment normal.  Vitals reviewed.   Ortho Exam  Neck exam tenderness severe in the base of the cervical spine.  Tenderness in anterolateral shoulder joint line.  Decreased range of motion and painful range of motion of the cervical spine.  Right shoulder stable cuff strength is normal skin again ecchymotic passive range of motion right shoulder normal with some discomfort on extremes of  motion no malalignment is seen in the right upper extremity  Reflexes are 2+ and equal bilaterally, she had a negative Hoffmann sign on each side  Painful reproduction of cervical spine arm pain with rotation extension of the neck indicating positive Spurling sign   Medical decision-making  Imaging:   CLINICAL DATA:  Right shoulder pain 1 month.  No injury.   EXAM: RIGHT SHOULDER - 2+ VIEW   COMPARISON:  None.   FINDINGS: There is no evidence of fracture or dislocation. There is no evidence of arthropathy or other focal bone abnormality. Soft tissues are  unremarkable.   IMPRESSION: Negative.     Electronically Signed   By: Marin Olp M.D.   On: 07/11/2017 19:35    MRI right shoulder   IMPRESSION: 1. Mild tendinosis of the supraspinatus and infraspinatus tendons. 2. Mild-moderate arthropathy of the acromioclavicular joint. 3. Small 12 mm ganglion cyst adjacent to the semimembranosus tendon insertion.     Electronically Signed   By: Kathreen Devoid   On: 07/31/2017 08:05  Right shoulder x-ray 3 views independent image interpretation of the x-ray shows no arthritis of the joint the acromion is a type II and the AC joint is probably arthritic on x-ray  The MRI does not show any rotator cuff tear it shows a more significant arthrosis of the acromioclavicular joint   Encounter Diagnoses  Name Primary?  . Neck pain Yes  . Subluxation of cervical vertebra, initial encounter   . Cervicalgia    She has extensive subluxation of C4 on C5 with spondylosis recommend MRI   Call patient with results   Arther Abbott, MD 10/09/2017 4:23 PM

## 2017-10-09 NOTE — Patient Instructions (Signed)
Steps to Quit Smoking Smoking tobacco can be bad for your health. It can also affect almost every organ in your body. Smoking puts you and people around you at risk for many serious Skarlett Sedlacek-lasting (chronic) diseases. Quitting smoking is hard, but it is one of the best things that you can do for your health. It is never too late to quit. What are the benefits of quitting smoking? When you quit smoking, you lower your risk for getting serious diseases and conditions. They can include:  Lung cancer or lung disease.  Heart disease.  Stroke.  Heart attack.  Not being able to have children (infertility).  Weak bones (osteoporosis) and broken bones (fractures).  If you have coughing, wheezing, and shortness of breath, those symptoms may get better when you quit. You may also get sick less often. If you are pregnant, quitting smoking can help to lower your chances of having a baby of low birth weight. What can I do to help me quit smoking? Talk with your doctor about what can help you quit smoking. Some things you can do (strategies) include:  Quitting smoking totally, instead of slowly cutting back how much you smoke over a period of time.  Going to in-person counseling. You are more likely to quit if you go to many counseling sessions.  Using resources and support systems, such as: ? Online chats with a counselor. ? Phone quitlines. ? Printed self-help materials. ? Support groups or group counseling. ? Text messaging programs. ? Mobile phone apps or applications.  Taking medicines. Some of these medicines may have nicotine in them. If you are pregnant or breastfeeding, do not take any medicines to quit smoking unless your doctor says it is okay. Talk with your doctor about counseling or other things that can help you.  Talk with your doctor about using more than one strategy at the same time, such as taking medicines while you are also going to in-person counseling. This can help make  quitting easier. What things can I do to make it easier to quit? Quitting smoking might feel very hard at first, but there is a lot that you can do to make it easier. Take these steps:  Talk to your family and friends. Ask them to support and encourage you.  Call phone quitlines, reach out to support groups, or work with a counselor.  Ask people who smoke to not smoke around you.  Avoid places that make you want (trigger) to smoke, such as: ? Bars. ? Parties. ? Smoke-break areas at work.  Spend time with people who do not smoke.  Lower the stress in your life. Stress can make you want to smoke. Try these things to help your stress: ? Getting regular exercise. ? Deep-breathing exercises. ? Yoga. ? Meditating. ? Doing a body scan. To do this, close your eyes, focus on one area of your body at a time from head to toe, and notice which parts of your body are tense. Try to relax the muscles in those areas.  Download or buy apps on your mobile phone or tablet that can help you stick to your quit plan. There are many free apps, such as QuitGuide from the CDC (Centers for Disease Control and Prevention). You can find more support from smokefree.gov and other websites.  This information is not intended to replace advice given to you by your health care provider. Make sure you discuss any questions you have with your health care provider. Document Released: 04/14/2009 Document   Revised: 02/14/2016 Document Reviewed: 11/02/2014 Elsevier Interactive Patient Education  2018 Elsevier Inc.  

## 2017-10-16 ENCOUNTER — Ambulatory Visit (HOSPITAL_COMMUNITY): Payer: PPO | Attending: Orthopedic Surgery

## 2017-10-16 DIAGNOSIS — E875 Hyperkalemia: Secondary | ICD-10-CM | POA: Diagnosis not present

## 2017-10-16 DIAGNOSIS — N189 Chronic kidney disease, unspecified: Secondary | ICD-10-CM | POA: Diagnosis not present

## 2017-10-16 DIAGNOSIS — F17200 Nicotine dependence, unspecified, uncomplicated: Secondary | ICD-10-CM | POA: Diagnosis not present

## 2017-10-16 DIAGNOSIS — Z79899 Other long term (current) drug therapy: Secondary | ICD-10-CM | POA: Diagnosis not present

## 2017-10-16 DIAGNOSIS — J13 Pneumonia due to Streptococcus pneumoniae: Secondary | ICD-10-CM | POA: Diagnosis not present

## 2017-10-16 DIAGNOSIS — Z9981 Dependence on supplemental oxygen: Secondary | ICD-10-CM | POA: Diagnosis not present

## 2017-10-16 DIAGNOSIS — I129 Hypertensive chronic kidney disease with stage 1 through stage 4 chronic kidney disease, or unspecified chronic kidney disease: Secondary | ICD-10-CM | POA: Diagnosis not present

## 2017-10-16 DIAGNOSIS — J9691 Respiratory failure, unspecified with hypoxia: Secondary | ICD-10-CM | POA: Diagnosis not present

## 2017-10-16 DIAGNOSIS — A419 Sepsis, unspecified organism: Secondary | ICD-10-CM | POA: Diagnosis not present

## 2017-10-16 DIAGNOSIS — Z8572 Personal history of non-Hodgkin lymphomas: Secondary | ICD-10-CM | POA: Diagnosis not present

## 2017-10-16 DIAGNOSIS — J9692 Respiratory failure, unspecified with hypercapnia: Secondary | ICD-10-CM | POA: Diagnosis not present

## 2017-10-16 DIAGNOSIS — E876 Hypokalemia: Secondary | ICD-10-CM | POA: Diagnosis not present

## 2017-10-16 DIAGNOSIS — I214 Non-ST elevation (NSTEMI) myocardial infarction: Secondary | ICD-10-CM | POA: Diagnosis not present

## 2017-10-16 DIAGNOSIS — I509 Heart failure, unspecified: Secondary | ICD-10-CM | POA: Diagnosis not present

## 2017-10-16 DIAGNOSIS — I252 Old myocardial infarction: Secondary | ICD-10-CM | POA: Diagnosis not present

## 2017-10-16 DIAGNOSIS — Z720 Tobacco use: Secondary | ICD-10-CM | POA: Diagnosis not present

## 2017-10-16 DIAGNOSIS — J441 Chronic obstructive pulmonary disease with (acute) exacerbation: Secondary | ICD-10-CM | POA: Diagnosis not present

## 2017-10-16 DIAGNOSIS — C8590 Non-Hodgkin lymphoma, unspecified, unspecified site: Secondary | ICD-10-CM | POA: Diagnosis not present

## 2017-10-16 DIAGNOSIS — J189 Pneumonia, unspecified organism: Secondary | ICD-10-CM | POA: Diagnosis not present

## 2017-10-16 DIAGNOSIS — Z7951 Long term (current) use of inhaled steroids: Secondary | ICD-10-CM | POA: Diagnosis not present

## 2017-10-16 DIAGNOSIS — J9690 Respiratory failure, unspecified, unspecified whether with hypoxia or hypercapnia: Secondary | ICD-10-CM | POA: Diagnosis not present

## 2017-10-16 DIAGNOSIS — J9622 Acute and chronic respiratory failure with hypercapnia: Secondary | ICD-10-CM | POA: Diagnosis not present

## 2017-10-16 DIAGNOSIS — J9621 Acute and chronic respiratory failure with hypoxia: Secondary | ICD-10-CM | POA: Diagnosis not present

## 2017-10-16 DIAGNOSIS — J22 Unspecified acute lower respiratory infection: Secondary | ICD-10-CM | POA: Diagnosis not present

## 2017-10-16 DIAGNOSIS — J440 Chronic obstructive pulmonary disease with acute lower respiratory infection: Secondary | ICD-10-CM | POA: Diagnosis not present

## 2017-10-16 DIAGNOSIS — R000 Tachycardia, unspecified: Secondary | ICD-10-CM | POA: Diagnosis not present

## 2017-10-16 DIAGNOSIS — J449 Chronic obstructive pulmonary disease, unspecified: Secondary | ICD-10-CM | POA: Diagnosis not present

## 2017-10-16 DIAGNOSIS — E860 Dehydration: Secondary | ICD-10-CM | POA: Diagnosis not present

## 2017-10-29 ENCOUNTER — Encounter: Payer: Self-pay | Admitting: Orthopedic Surgery

## 2017-10-29 ENCOUNTER — Ambulatory Visit: Payer: PPO | Admitting: Orthopedic Surgery

## 2017-10-29 VITALS — BP 78/49 | HR 67 | Ht 64.0 in | Wt 106.0 lb

## 2017-10-29 DIAGNOSIS — S13100D Subluxation of unspecified cervical vertebrae, subsequent encounter: Secondary | ICD-10-CM

## 2017-10-29 NOTE — Progress Notes (Signed)
Chief Complaint  Patient presents with  . New Patient (Initial Visit)    right shoulder and neck pain   Chief Complaint  Patient presents with  . Neck Pain    patient did not go for MRI scan states neck still painful patient states she does not remember seeing Dr Aline Brochure in the office or being scheduled for the MRI scan   Follow-up visit patient was not able to go to MRI she apparently was in the hospital for pneumonia for 6 days her memory is cloudy and vague about what happened looks like she might of been scheduled for the MRI during the time was when she got sick  She presents back with the complaints as noted below with no improvement in the right upper extremity numbness tingling and weakness  Prior history is noted below along with review of systems which has not changed she does not have any shortness of breath at present   62 year old female complains of pain in right shoulder for 4 months  She had an MRI of her right shoulder and an x-ray of the right shoulder she has tendinitis of the rotator cuff  However, today she says that her neck hurts her right arm goes numb and tingles starting from the neck radiating down into the right hand with weakness in the right upper extremity  She also complains of severe sharp pain in the periscapular region  She denies any trauma    Review of Systems  Constitutional: Negative for chills, fever and weight loss.  Musculoskeletal: Positive for back pain and neck pain.  Neurological: Positive for tingling, sensory change and weakness.   Past Medical History:  Diagnosis Date  . Anemia   . Anginal pain ([place])    occ last time last wk  . Anxiety   . Arthritis   . Asthma   . Atherosclerosis of native arteries of the extremities with rest pain 12/10/2008   Qualifier: Diagnosis of  By: Owens Shark, RN, BSN, Lauren    . Blood transfusion   . Cancer (HCC)    hx of non hogdin lymphoma  . Carotid artery disease (H[facility])    hx of carotid  cerebrovascular disease with 0-39% bilateral internal carotid artery setenosis   . CHF (congestive heart failure) ([place])   . Chronic back pain    status post lumbar surgery  . COPD 10/27/2008   Qualifier: Diagnosis of  By: Owens Shark, RN, BSN, Lauren    . COPD (chronic obstructive pulmonary disease) ([place])   . Coronary artery disease    status post stenting of the aortic and iliac vessels by Dr. Gwenlyn Found. Repeat aortic stenting 2010  . Depression   . Diabetes mellitus without complication ([place])   . Dysrhythmia    ?  [name] GERD (gastroesophageal reflux disease)   . History of kidney stones   . Hyperlipidemia   . Hypertension   . Langerhans cell histiocytosis of lung, 2009 11/10/2013  . Left ventricular dysfunction    hx of with ejection fraction of 35% range.  . Lymphocytic lymphoma ([place]) 12/24/2013  . Myocardial infarction ([place])   . Non Hodgkin's lymphoma ([place])   . Non-Hodgkin lymphoma, B-cell, low grade 12/24/2013  . Osteoporosis   . Other malignant lymphomas, unspecified site, extranodal and solid organ sites 2004   Back  . Peripheral vascular disease ([place])   . Pneumonia    hx  . PVD 10/27/2008   Qualifier: Diagnosis of  By: Owens Shark, RN, BSN, Lauren    .  Seizures ([place])    ? yrs ago  . Shortness of breath   . Stroke ([place]) 04  . Vulvar cancer (HCC)    BP (!) 78/49   Pulse 67   Ht 5\' 4"  (1.626 m)   Wt 106 lb (48.1 kg)   BMI 18.19 kg/m  Physical Exam  Constitutional: [name] oriented to person, place, and time. She appears well-developed and well-nourished.  Ectomorphic body habitus  Neck:    Musculoskeletal:       Arms: Neurological: [name] alert and oriented to person, place, and time.  Psychiatric: She has a normal mood and affect. Judgment normal.  Vitals reviewed.  Prior x-ray shows C4-5 subluxation with C4 anteriorly subluxated on C5 with disc space narrowing at C5-C6 mid cervical uncovertebral joint spondylosis severe abnormal kyphosis and subluxation of the cervical  spine  Recommend MRI to evaluate the neural elements for possible compression or myelopathic changes

## 2017-10-29 NOTE — Patient Instructions (Signed)
Call to Reschedule your MRI scan at [facility], the number to call is [phone]

## 2017-11-01 ENCOUNTER — Ambulatory Visit (HOSPITAL_COMMUNITY): Admission: RE | Admit: 2017-11-01 | Payer: PPO | Source: Ambulatory Visit

## 2017-11-04 ENCOUNTER — Ambulatory Visit (HOSPITAL_COMMUNITY)
Admission: RE | Admit: 2017-11-04 | Discharge: 2017-11-04 | Disposition: A | Discharge status: Home / Self Care | Payer: PPO | Source: Ambulatory Visit | Attending: Orthopedic Surgery | Admitting: Orthopedic Surgery

## 2017-11-04 DIAGNOSIS — M4802 Spinal stenosis, cervical region: Secondary | ICD-10-CM | POA: Diagnosis not present

## 2017-11-04 DIAGNOSIS — M542 Cervicalgia: Secondary | ICD-10-CM

## 2017-11-04 DIAGNOSIS — S13100A Subluxation of unspecified cervical vertebrae, initial encounter: Secondary | ICD-10-CM

## 2017-11-04 DIAGNOSIS — M47812 Spondylosis without myelopathy or radiculopathy, cervical region: Secondary | ICD-10-CM | POA: Insufficient documentation

## 2017-11-06 ENCOUNTER — Encounter: Payer: Self-pay | Admitting: Orthopedic Surgery

## 2017-11-06 ENCOUNTER — Ambulatory Visit: Payer: PPO | Admitting: Orthopedic Surgery

## 2017-11-06 VITALS — BP 96/64 | HR 78 | Ht 64.0 in | Wt 106.0 lb

## 2017-11-06 DIAGNOSIS — S13100D Subluxation of unspecified cervical vertebrae, subsequent encounter: Secondary | ICD-10-CM | POA: Diagnosis not present

## 2017-11-06 DIAGNOSIS — M47812 Spondylosis without myelopathy or radiculopathy, cervical region: Secondary | ICD-10-CM | POA: Diagnosis not present

## 2017-11-06 NOTE — Patient Instructions (Signed)
Cervical Subluxation Cervical subluxation is a separation of the neck bones (cervical vertebrae) that occurs because of injury to the ligaments that hold the vertebrae together. The cervical vertebrae are made up of seven bones. These vertebrae support the head and protect the spinal cord as it passes through the neck. What are the causes? This condition may be caused by injury or trauma with sudden impact. It commonly occurs when the neck is extended too far. This can happen from:  A car accident. This is the most common cause of this condition.  A sports injury.  A fall.  A violent physical attack.  What are the signs or symptoms? Symptoms can vary depending on the angle of separation to the cervical vertebrae. Symptoms include:  Pain or tenderness when the neck is touched.  Pain when moving the neck.  Headaches.  Stiff neck.  Difficulty moving the neck.  Muscle spasms in the neck.  Dizziness.  With a spinal cord or nerve root injury in the neck, symptoms may also include:  Weakness and numbness in the arm or leg.  A weak hand grasp. This may cause you to drop objects.  Poor handwriting.  Falling or having difficulty with balance.  Loss of bowel or bladder control (incontinence).  How is this diagnosed? This condition is diagnosed with a physical exam, medical history, and imaging tests. Tests may include:  X-rays.  CT scan.  MRI.  How is this treated? Treatment for this condition depends on the severity of the condition. Treatment may involve:  Wearing a neck brace or collar to support the neck. This limits how much you can move your neck.  Medicine to reduce pain and swelling.  Closed reduction. This uses physical manipulation to realign the cervical vertebrae without surgery.  Skeletal traction. This uses weights, pulleys, and ropes to exert a pulling force to help realign the cervical vertebrae. It may be used prior to surgery.  Surgery to put the  cervical vertebrae back in place. Surgery is needed if: ? The cervical vertebrae do not stay in place and they move easily (are unstable). ? There is a lot of nerve damage in the cervical vertebrae area. ? There is a spinal cord injury. ? There is a pocket of blood over the spinal cord. ? There is a cervical disc pushing on the spinal cord.  Follow these instructions at home:  Wear a neck brace or collar as long as told by your health care provider. Do not remove it until your health care provider approves.  Rest and limit your neck movement as told by your health care provider.  Limit your physical activity as told by your health care provider. Ask your health care provider what activities are safe for you.  Take over-the-counter and prescription medicines only as told by your health care provider.  If physical therapy was prescribed, perform exercises as told by your health care provider or physical therapist.  Keep all follow-up visits as told by your health care provider. This is important. Contact a health care provider if:  Your pain continues and does not get better after you take pain medicine.  You have numbness or weakness in your neck.  You have a fever. Get help right away if:  You have sudden, severe neck pain.  You have sudden, severe weakness, numbness, or tingling in your arms or legs.  You have weakness, numbness, or tingling in your arms or legs that is getting worse.  You have difficulty walking,  or you fall for no reason.  You have a severe headache that does not go away.  You cannot control your bowel or bladder.  You have difficulty breathing. This information is not intended to replace advice given to you by your health care provider. Make sure you discuss any questions you have with your health care provider. Document Released: 01/01/2011 Document Revised: 01/06/2016 Document Reviewed: 08/12/2014 Elsevier Interactive Patient Education  2018  [employer].  Cervical Radiculopathy Cervical radiculopathy happens when a nerve in the neck (cervical nerve) is pinched or bruised. This condition can develop because of an injury or as part of the normal aging process. Pressure on the cervical nerves can cause pain or numbness that runs from the neck all the way down into the arm and fingers. Usually, this condition gets better with rest. Treatment may be needed if the condition does not improve. What are the causes? This condition may be caused by:  Injury.  Slipped (herniated) disk.  Muscle tightness in the neck because of overuse.  Arthritis.  Breakdown or degeneration in the bones and joints of the spine (spondylosis) due to aging.  Bone spurs that may develop near the cervical nerves.  What are the signs or symptoms? Symptoms of this condition include:  Pain that runs from the neck to the arm and hand. The pain can be severe or irritating. It may be worse when the neck is moved.  Numbness or weakness in the affected arm and hand.  How is this diagnosed? This condition may be diagnosed based on symptoms, medical history, and a physical exam. You may also have tests, including:  X-rays.  CT scan.  MRI.  Electromyogram (EMG).  Nerve conduction tests.  How is this treated? In many cases, treatment is not needed for this condition. With rest, the condition usually gets better over time. If treatment is needed, options may include:  Wearing a soft neck collar for short periods of time.  Physical therapy to strengthen your neck muscles.  Medicines, such as NSAIDs, oral corticosteroids, or spinal injections.  Surgery. This may be needed if other treatments do not help. Various types of surgery may be done depending on the cause of your problems.  Follow these instructions at home: Managing pain  Take over-the-counter and prescription medicines only as told by your health care provider.  If directed, apply ice to  the affected area. ? Put ice in a plastic bag. ? Place a towel between your skin and the bag. ? Leave the ice on for 20 minutes, 2-3 times per day.  If ice does not help, you can try using heat. Take a warm shower or warm bath, or use a heat pack as told by your health care provider.  Try a gentle neck and shoulder massage to help relieve symptoms. Activity  Rest as needed. Follow instructions from your health care provider about any restrictions on activities.  Do stretching and strengthening exercises as told by your health care provider or physical therapist. General instructions  If you were given a soft collar, wear it as told by your health care provider.  Use a flat pillow when you sleep.  Keep all follow-up visits as told by your health care provider. This is important. Contact a health care provider if:  Your condition does not improve with treatment. Get help right away if:  Your pain gets much worse and cannot be controlled with medicines.  You have weakness or numbness in your hand, arm, face,  or leg.  You have a high fever.  You have a stiff, rigid neck.  You lose control of your bowels or your bladder (have incontinence).  You have trouble with walking, balance, or speaking. This information is not intended to replace advice given to you by your health care provider. Make sure you discuss any questions you have with your health care provider. Document Released: 03/13/2001 Document Revised: 11/24/2015 Document Reviewed: 08/12/2014 Elsevier Interactive Patient Education  [employer].

## 2017-11-06 NOTE — Addendum Note (Signed)
Addended byCandice Camp on: 11/06/2017 09:55 AM   Modules accepted: Orders

## 2017-11-06 NOTE — Progress Notes (Signed)
FOLLOW UP VISIT : MRI RESULTS   Chief Complaint  Patient presents with  . Neck Pain  . Results    review MRI      HPI: The patient is here TO DISCUSS THE RESULTS OF MRI  62 year old recently treated for pneumonia currently on oxygen complains of numbness and tingling right upper extremity at her MRI no improvement   Review of Systems  Respiratory: Positive for shortness of breath.   Musculoskeletal: Positive for neck pain.  Neurological: Positive for tingling, sensory change and weakness.      BP 96/64   Pulse 78   Ht 5\' 4"  (1.626 m)   Wt 106 lb (48.1 kg)   BMI 18.19 kg/m     Medical decision-making section   DATA  MRI REPORT:  Discs: Degenerative disc disease with disc height loss at C5-6.   C2-3: No significant disc bulge. No neural foraminal stenosis. No central canal stenosis.   C3-4: Small right paracentral disc protrusion. No neural foraminal stenosis. No central canal stenosis.   C4-5: Broad shallow left paracentral disc protrusion. No neural foraminal stenosis. No central canal stenosis.   C5-6: Mild broad-based disc bulge. Bilateral uncovertebral degenerative changes. Moderate left and severe right foraminal stenosis. No central canal stenosis.   C6-7: Broad-based disc bulge with a right paracentral/foraminal disc osteophyte complex. Moderate right foraminal stenosis. No left foraminal stenosis. No central canal stenosis.   C7-T1: No significant disc bulge. No neural foraminal stenosis. No central canal stenosis.   IMPRESSION: Cervical spine spondylosis as described above.     Electronically Signed   By: Kathreen Devoid   On: 11/04/2017 13:23    MY READING: MRI OF THE multiplanar MRI cervical spine multiple sequences  Chief complaint neck pain with radicular pain right arm  The patient has disease throughout the cervical spine with spondylosis and then at C5-7 she has moderate right foraminal stenosis and then broad-based disc bulges as  well   Encounter Diagnosis  Name Primary?  . Subluxation of cervical vertebra, subsequent encounter Yes     PLAN:   Recommend referral to neurosurgery for definitive management.  MRI results discussed with the patient.

## 2017-12-03 DIAGNOSIS — E46 Unspecified protein-calorie malnutrition: Secondary | ICD-10-CM | POA: Diagnosis not present

## 2017-12-03 DIAGNOSIS — Z79891 Long term (current) use of opiate analgesic: Secondary | ICD-10-CM | POA: Diagnosis not present

## 2017-12-03 DIAGNOSIS — J449 Chronic obstructive pulmonary disease, unspecified: Secondary | ICD-10-CM | POA: Diagnosis not present

## 2017-12-03 DIAGNOSIS — F17200 Nicotine dependence, unspecified, uncomplicated: Secondary | ICD-10-CM | POA: Diagnosis not present

## 2017-12-03 DIAGNOSIS — R634 Abnormal weight loss: Secondary | ICD-10-CM | POA: Diagnosis not present

## 2017-12-06 DIAGNOSIS — F17200 Nicotine dependence, unspecified, uncomplicated: Secondary | ICD-10-CM | POA: Diagnosis not present

## 2017-12-06 DIAGNOSIS — E46 Unspecified protein-calorie malnutrition: Secondary | ICD-10-CM | POA: Diagnosis not present

## 2017-12-06 DIAGNOSIS — J449 Chronic obstructive pulmonary disease, unspecified: Secondary | ICD-10-CM | POA: Diagnosis not present

## 2017-12-06 DIAGNOSIS — R634 Abnormal weight loss: Secondary | ICD-10-CM | POA: Diagnosis not present

## 2017-12-12 ENCOUNTER — Other Ambulatory Visit (HOSPITAL_COMMUNITY): Payer: Self-pay | Admitting: Pulmonary Disease

## 2017-12-12 DIAGNOSIS — R634 Abnormal weight loss: Secondary | ICD-10-CM

## 2017-12-31 ENCOUNTER — Ambulatory Visit (HOSPITAL_COMMUNITY)
Admission: RE | Admit: 2017-12-31 | Discharge: 2017-12-31 | Disposition: A | Discharge status: Home / Self Care | Payer: PPO | Source: Ambulatory Visit | Attending: Pulmonary Disease | Admitting: Pulmonary Disease

## 2017-12-31 DIAGNOSIS — K838 Other specified diseases of biliary tract: Secondary | ICD-10-CM | POA: Diagnosis not present

## 2017-12-31 DIAGNOSIS — I2510 Atherosclerotic heart disease of native coronary artery without angina pectoris: Secondary | ICD-10-CM | POA: Diagnosis not present

## 2017-12-31 DIAGNOSIS — I700 Atherosclerosis of aorta: Secondary | ICD-10-CM | POA: Insufficient documentation

## 2017-12-31 DIAGNOSIS — I517 Cardiomegaly: Secondary | ICD-10-CM | POA: Diagnosis not present

## 2017-12-31 DIAGNOSIS — J439 Emphysema, unspecified: Secondary | ICD-10-CM | POA: Insufficient documentation

## 2017-12-31 DIAGNOSIS — C8590 Non-Hodgkin lymphoma, unspecified, unspecified site: Secondary | ICD-10-CM | POA: Diagnosis not present

## 2017-12-31 DIAGNOSIS — R634 Abnormal weight loss: Secondary | ICD-10-CM | POA: Diagnosis not present

## 2017-12-31 MED ORDER — IOPAMIDOL (ISOVUE-300) INJECTION 61%
100.0000 mL | Freq: Once | INTRAVENOUS | Status: AC | PRN
  Administered 2017-12-31: 80 mL via INTRAVENOUS

## 2018-01-20 DIAGNOSIS — J449 Chronic obstructive pulmonary disease, unspecified: Secondary | ICD-10-CM | POA: Diagnosis not present

## 2018-02-20 DIAGNOSIS — J449 Chronic obstructive pulmonary disease, unspecified: Secondary | ICD-10-CM | POA: Diagnosis not present

## 2018-03-05 DIAGNOSIS — I739 Peripheral vascular disease, unspecified: Secondary | ICD-10-CM | POA: Diagnosis not present

## 2018-03-05 DIAGNOSIS — E785 Hyperlipidemia, unspecified: Secondary | ICD-10-CM | POA: Diagnosis not present

## 2018-03-05 DIAGNOSIS — Z23 Encounter for immunization: Secondary | ICD-10-CM | POA: Diagnosis not present

## 2018-03-05 DIAGNOSIS — J449 Chronic obstructive pulmonary disease, unspecified: Secondary | ICD-10-CM | POA: Diagnosis not present

## 2018-03-05 DIAGNOSIS — E46 Unspecified protein-calorie malnutrition: Secondary | ICD-10-CM | POA: Diagnosis not present

## 2018-04-22 DIAGNOSIS — J449 Chronic obstructive pulmonary disease, unspecified: Secondary | ICD-10-CM | POA: Diagnosis not present

## 2018-05-23 DIAGNOSIS — J449 Chronic obstructive pulmonary disease, unspecified: Secondary | ICD-10-CM | POA: Diagnosis not present

## 2018-06-04 DIAGNOSIS — F17200 Nicotine dependence, unspecified, uncomplicated: Secondary | ICD-10-CM | POA: Diagnosis not present

## 2018-06-04 DIAGNOSIS — I25119 Atherosclerotic heart disease of native coronary artery with unspecified angina pectoris: Secondary | ICD-10-CM | POA: Diagnosis not present

## 2018-06-04 DIAGNOSIS — E46 Unspecified protein-calorie malnutrition: Secondary | ICD-10-CM | POA: Diagnosis not present

## 2018-06-04 DIAGNOSIS — J449 Chronic obstructive pulmonary disease, unspecified: Secondary | ICD-10-CM | POA: Diagnosis not present

## 2018-06-22 DIAGNOSIS — J449 Chronic obstructive pulmonary disease, unspecified: Secondary | ICD-10-CM | POA: Diagnosis not present

## 2018-07-23 DIAGNOSIS — J449 Chronic obstructive pulmonary disease, unspecified: Secondary | ICD-10-CM | POA: Diagnosis not present

## 2018-08-23 DIAGNOSIS — J449 Chronic obstructive pulmonary disease, unspecified: Secondary | ICD-10-CM | POA: Diagnosis not present

## 2018-09-01 DIAGNOSIS — F17200 Nicotine dependence, unspecified, uncomplicated: Secondary | ICD-10-CM | POA: Diagnosis not present

## 2018-09-01 DIAGNOSIS — J449 Chronic obstructive pulmonary disease, unspecified: Secondary | ICD-10-CM | POA: Diagnosis not present

## 2018-09-01 DIAGNOSIS — I739 Peripheral vascular disease, unspecified: Secondary | ICD-10-CM | POA: Diagnosis not present

## 2018-09-01 DIAGNOSIS — I25119 Atherosclerotic heart disease of native coronary artery with unspecified angina pectoris: Secondary | ICD-10-CM | POA: Diagnosis not present

## 2018-09-21 DIAGNOSIS — J449 Chronic obstructive pulmonary disease, unspecified: Secondary | ICD-10-CM | POA: Diagnosis not present

## 2018-10-22 DIAGNOSIS — J449 Chronic obstructive pulmonary disease, unspecified: Secondary | ICD-10-CM | POA: Diagnosis not present

## 2018-11-21 DIAGNOSIS — J449 Chronic obstructive pulmonary disease, unspecified: Secondary | ICD-10-CM | POA: Diagnosis not present

## 2018-11-26 ENCOUNTER — Other Ambulatory Visit (HOSPITAL_COMMUNITY): Payer: Self-pay | Admitting: Pulmonary Disease

## 2018-11-26 DIAGNOSIS — Z1231 Encounter for screening mammogram for malignant neoplasm of breast: Secondary | ICD-10-CM

## 2018-12-02 DIAGNOSIS — J449 Chronic obstructive pulmonary disease, unspecified: Secondary | ICD-10-CM | POA: Diagnosis not present

## 2018-12-02 DIAGNOSIS — I739 Peripheral vascular disease, unspecified: Secondary | ICD-10-CM | POA: Diagnosis not present

## 2018-12-02 DIAGNOSIS — I10 Essential (primary) hypertension: Secondary | ICD-10-CM | POA: Diagnosis not present

## 2018-12-02 DIAGNOSIS — I25119 Atherosclerotic heart disease of native coronary artery with unspecified angina pectoris: Secondary | ICD-10-CM | POA: Diagnosis not present

## 2018-12-05 DIAGNOSIS — I25119 Atherosclerotic heart disease of native coronary artery with unspecified angina pectoris: Secondary | ICD-10-CM | POA: Diagnosis not present

## 2018-12-05 DIAGNOSIS — I739 Peripheral vascular disease, unspecified: Secondary | ICD-10-CM | POA: Diagnosis not present

## 2018-12-05 DIAGNOSIS — I10 Essential (primary) hypertension: Secondary | ICD-10-CM | POA: Diagnosis not present

## 2018-12-05 DIAGNOSIS — J449 Chronic obstructive pulmonary disease, unspecified: Secondary | ICD-10-CM | POA: Diagnosis not present

## 2018-12-22 DIAGNOSIS — J449 Chronic obstructive pulmonary disease, unspecified: Secondary | ICD-10-CM | POA: Diagnosis not present

## 2019-01-21 DIAGNOSIS — J449 Chronic obstructive pulmonary disease, unspecified: Secondary | ICD-10-CM | POA: Diagnosis not present

## 2019-02-21 DIAGNOSIS — J449 Chronic obstructive pulmonary disease, unspecified: Secondary | ICD-10-CM | POA: Diagnosis not present

## 2019-02-27 ENCOUNTER — Other Ambulatory Visit: Payer: Self-pay | Admitting: Cardiovascular Disease

## 2019-03-05 DIAGNOSIS — I2510 Atherosclerotic heart disease of native coronary artery without angina pectoris: Secondary | ICD-10-CM | POA: Diagnosis not present

## 2019-03-05 DIAGNOSIS — M545 Low back pain: Secondary | ICD-10-CM | POA: Diagnosis not present

## 2019-03-05 DIAGNOSIS — J9611 Chronic respiratory failure with hypoxia: Secondary | ICD-10-CM | POA: Diagnosis not present

## 2019-03-05 DIAGNOSIS — J449 Chronic obstructive pulmonary disease, unspecified: Secondary | ICD-10-CM | POA: Diagnosis not present

## 2019-03-24 DIAGNOSIS — J449 Chronic obstructive pulmonary disease, unspecified: Secondary | ICD-10-CM | POA: Diagnosis not present

## 2019-05-22 ENCOUNTER — Other Ambulatory Visit: Payer: Self-pay | Admitting: Cardiovascular Disease

## 2019-06-03 DIAGNOSIS — M545 Low back pain: Secondary | ICD-10-CM | POA: Diagnosis not present

## 2019-06-03 DIAGNOSIS — Z23 Encounter for immunization: Secondary | ICD-10-CM | POA: Diagnosis not present

## 2019-06-03 DIAGNOSIS — M542 Cervicalgia: Secondary | ICD-10-CM | POA: Diagnosis not present

## 2019-06-03 DIAGNOSIS — I2510 Atherosclerotic heart disease of native coronary artery without angina pectoris: Secondary | ICD-10-CM | POA: Diagnosis not present

## 2019-06-03 DIAGNOSIS — J449 Chronic obstructive pulmonary disease, unspecified: Secondary | ICD-10-CM | POA: Diagnosis not present

## 2019-06-08 ENCOUNTER — Encounter (HOSPITAL_COMMUNITY): Payer: Self-pay | Admitting: *Deleted

## 2019-06-08 NOTE — Progress Notes (Signed)
I attempted to contact the patient today following a referral to the [facility] lung Cancer screening program.  I was unable to leave patient a message.  I will attempt to call her again tomorrow.

## 2019-06-16 ENCOUNTER — Encounter (HOSPITAL_COMMUNITY): Payer: Self-pay | Admitting: *Deleted

## 2019-06-16 NOTE — Progress Notes (Signed)
I attempted to contact the patient today and was unsuccessful. I was unable to leave a message.

## 2019-07-10 ENCOUNTER — Encounter (HOSPITAL_COMMUNITY): Payer: Self-pay | Admitting: *Deleted

## 2019-07-10 NOTE — Progress Notes (Signed)
I attempted to contact patient today with the number provided [phone].  The lady that answered advised that it was the wrong number.  At this time, the referral to the [facility] will be denied.  Referring provider has been updated and they will advise patient at next visit.

## 2019-07-13 DIAGNOSIS — F419 Anxiety disorder, unspecified: Secondary | ICD-10-CM | POA: Diagnosis not present

## 2019-07-13 DIAGNOSIS — I2510 Atherosclerotic heart disease of native coronary artery without angina pectoris: Secondary | ICD-10-CM | POA: Diagnosis not present

## 2019-07-13 DIAGNOSIS — M25561 Pain in right knee: Secondary | ICD-10-CM | POA: Diagnosis not present

## 2019-07-13 DIAGNOSIS — Z8673 Personal history of transient ischemic attack (TIA), and cerebral infarction without residual deficits: Secondary | ICD-10-CM | POA: Diagnosis not present

## 2019-07-13 DIAGNOSIS — E785 Hyperlipidemia, unspecified: Secondary | ICD-10-CM | POA: Diagnosis not present

## 2019-07-13 DIAGNOSIS — M25562 Pain in left knee: Secondary | ICD-10-CM | POA: Diagnosis not present

## 2019-07-13 DIAGNOSIS — Z79891 Long term (current) use of opiate analgesic: Secondary | ICD-10-CM | POA: Diagnosis not present

## 2019-07-13 DIAGNOSIS — I110 Hypertensive heart disease with heart failure: Secondary | ICD-10-CM | POA: Diagnosis not present

## 2019-07-13 DIAGNOSIS — I739 Peripheral vascular disease, unspecified: Secondary | ICD-10-CM | POA: Diagnosis not present

## 2019-07-13 DIAGNOSIS — J449 Chronic obstructive pulmonary disease, unspecified: Secondary | ICD-10-CM | POA: Diagnosis not present

## 2019-07-13 DIAGNOSIS — M545 Low back pain: Secondary | ICD-10-CM | POA: Diagnosis not present

## 2019-07-13 DIAGNOSIS — F321 Major depressive disorder, single episode, moderate: Secondary | ICD-10-CM | POA: Diagnosis not present

## 2019-07-14 DIAGNOSIS — M545 Low back pain: Secondary | ICD-10-CM | POA: Diagnosis not present

## 2019-07-14 DIAGNOSIS — Z79891 Long term (current) use of opiate analgesic: Secondary | ICD-10-CM | POA: Diagnosis not present

## 2019-07-20 ENCOUNTER — Encounter (HOSPITAL_COMMUNITY): Payer: Self-pay | Admitting: *Deleted

## 2019-07-22 DIAGNOSIS — F321 Major depressive disorder, single episode, moderate: Secondary | ICD-10-CM | POA: Diagnosis not present

## 2019-07-22 DIAGNOSIS — M25562 Pain in left knee: Secondary | ICD-10-CM | POA: Diagnosis not present

## 2019-07-22 DIAGNOSIS — F419 Anxiety disorder, unspecified: Secondary | ICD-10-CM | POA: Diagnosis not present

## 2019-07-22 DIAGNOSIS — M25561 Pain in right knee: Secondary | ICD-10-CM | POA: Diagnosis not present

## 2019-07-22 DIAGNOSIS — M545 Low back pain: Secondary | ICD-10-CM | POA: Diagnosis not present

## 2019-07-23 DIAGNOSIS — M545 Low back pain: Secondary | ICD-10-CM | POA: Diagnosis not present

## 2019-07-28 DIAGNOSIS — E785 Hyperlipidemia, unspecified: Secondary | ICD-10-CM | POA: Diagnosis not present

## 2019-07-28 DIAGNOSIS — M25561 Pain in right knee: Secondary | ICD-10-CM | POA: Diagnosis not present

## 2019-07-28 DIAGNOSIS — Z79891 Long term (current) use of opiate analgesic: Secondary | ICD-10-CM | POA: Diagnosis not present

## 2019-07-28 DIAGNOSIS — M545 Low back pain: Secondary | ICD-10-CM | POA: Diagnosis not present

## 2019-07-28 DIAGNOSIS — M25562 Pain in left knee: Secondary | ICD-10-CM | POA: Diagnosis not present

## 2019-08-17 DIAGNOSIS — Z79891 Long term (current) use of opiate analgesic: Secondary | ICD-10-CM | POA: Diagnosis not present

## 2019-08-17 DIAGNOSIS — M545 Low back pain: Secondary | ICD-10-CM | POA: Diagnosis not present

## 2019-08-17 DIAGNOSIS — E785 Hyperlipidemia, unspecified: Secondary | ICD-10-CM | POA: Diagnosis not present

## 2019-08-18 DIAGNOSIS — Z79891 Long term (current) use of opiate analgesic: Secondary | ICD-10-CM | POA: Diagnosis not present

## 2019-08-25 DIAGNOSIS — E785 Hyperlipidemia, unspecified: Secondary | ICD-10-CM | POA: Diagnosis not present

## 2019-08-25 DIAGNOSIS — F321 Major depressive disorder, single episode, moderate: Secondary | ICD-10-CM | POA: Diagnosis not present

## 2019-08-25 DIAGNOSIS — F419 Anxiety disorder, unspecified: Secondary | ICD-10-CM | POA: Diagnosis not present

## 2019-08-25 DIAGNOSIS — J449 Chronic obstructive pulmonary disease, unspecified: Secondary | ICD-10-CM | POA: Diagnosis not present

## 2019-08-25 DIAGNOSIS — M25561 Pain in right knee: Secondary | ICD-10-CM | POA: Diagnosis not present

## 2019-08-25 DIAGNOSIS — Z79891 Long term (current) use of opiate analgesic: Secondary | ICD-10-CM | POA: Diagnosis not present

## 2019-08-25 DIAGNOSIS — M25562 Pain in left knee: Secondary | ICD-10-CM | POA: Diagnosis not present

## 2019-09-08 DIAGNOSIS — M25562 Pain in left knee: Secondary | ICD-10-CM | POA: Diagnosis not present

## 2019-09-08 DIAGNOSIS — F419 Anxiety disorder, unspecified: Secondary | ICD-10-CM | POA: Diagnosis not present

## 2019-09-08 DIAGNOSIS — F321 Major depressive disorder, single episode, moderate: Secondary | ICD-10-CM | POA: Diagnosis not present

## 2019-09-08 DIAGNOSIS — Z79891 Long term (current) use of opiate analgesic: Secondary | ICD-10-CM | POA: Diagnosis not present

## 2019-09-08 DIAGNOSIS — M25561 Pain in right knee: Secondary | ICD-10-CM | POA: Diagnosis not present

## 2019-09-08 DIAGNOSIS — E785 Hyperlipidemia, unspecified: Secondary | ICD-10-CM | POA: Diagnosis not present

## 2019-09-08 DIAGNOSIS — M545 Low back pain: Secondary | ICD-10-CM | POA: Diagnosis not present

## 2019-09-29 DIAGNOSIS — Z1331 Encounter for screening for depression: Secondary | ICD-10-CM | POA: Diagnosis not present

## 2019-09-29 DIAGNOSIS — Z79891 Long term (current) use of opiate analgesic: Secondary | ICD-10-CM | POA: Diagnosis not present

## 2019-09-29 DIAGNOSIS — Z79899 Other long term (current) drug therapy: Secondary | ICD-10-CM | POA: Diagnosis not present

## 2019-09-29 DIAGNOSIS — Z1339 Encounter for screening examination for other mental health and behavioral disorders: Secondary | ICD-10-CM | POA: Diagnosis not present

## 2019-09-29 DIAGNOSIS — F17210 Nicotine dependence, cigarettes, uncomplicated: Secondary | ICD-10-CM | POA: Diagnosis not present

## 2019-10-29 DIAGNOSIS — M545 Low back pain: Secondary | ICD-10-CM | POA: Diagnosis not present

## 2019-10-29 DIAGNOSIS — G8929 Other chronic pain: Secondary | ICD-10-CM | POA: Diagnosis not present

## 2019-10-29 DIAGNOSIS — Z79891 Long term (current) use of opiate analgesic: Secondary | ICD-10-CM | POA: Diagnosis not present

## 2019-10-29 DIAGNOSIS — G893 Neoplasm related pain (acute) (chronic): Secondary | ICD-10-CM | POA: Diagnosis not present

## 2019-10-29 DIAGNOSIS — Z79899 Other long term (current) drug therapy: Secondary | ICD-10-CM | POA: Diagnosis not present

## 2019-11-01 ENCOUNTER — Other Ambulatory Visit: Payer: Self-pay

## 2019-11-01 ENCOUNTER — Encounter (HOSPITAL_COMMUNITY): Payer: Self-pay | Admitting: Emergency Medicine

## 2019-11-01 ENCOUNTER — Emergency Department (HOSPITAL_COMMUNITY): Payer: PPO

## 2019-11-01 ENCOUNTER — Emergency Department (HOSPITAL_COMMUNITY)
Admission: EM | Admit: 2019-11-01 | Discharge: 2019-11-01 | Disposition: A | Discharge status: Home / Self Care | Payer: PPO | Attending: Emergency Medicine | Admitting: Emergency Medicine

## 2019-11-01 DIAGNOSIS — I2510 Atherosclerotic heart disease of native coronary artery without angina pectoris: Secondary | ICD-10-CM | POA: Diagnosis not present

## 2019-11-01 DIAGNOSIS — M25512 Pain in left shoulder: Secondary | ICD-10-CM | POA: Insufficient documentation

## 2019-11-01 DIAGNOSIS — F17210 Nicotine dependence, cigarettes, uncomplicated: Secondary | ICD-10-CM | POA: Diagnosis not present

## 2019-11-01 DIAGNOSIS — E119 Type 2 diabetes mellitus without complications: Secondary | ICD-10-CM | POA: Diagnosis not present

## 2019-11-01 DIAGNOSIS — M25511 Pain in right shoulder: Secondary | ICD-10-CM | POA: Diagnosis not present

## 2019-11-01 DIAGNOSIS — Z79899 Other long term (current) drug therapy: Secondary | ICD-10-CM | POA: Insufficient documentation

## 2019-11-01 DIAGNOSIS — I509 Heart failure, unspecified: Secondary | ICD-10-CM | POA: Diagnosis not present

## 2019-11-01 DIAGNOSIS — I10 Essential (primary) hypertension: Secondary | ICD-10-CM | POA: Diagnosis not present

## 2019-11-01 DIAGNOSIS — M79671 Pain in right foot: Secondary | ICD-10-CM | POA: Diagnosis not present

## 2019-11-01 DIAGNOSIS — R29818 Other symptoms and signs involving the nervous system: Secondary | ICD-10-CM | POA: Diagnosis not present

## 2019-11-01 DIAGNOSIS — R29810 Facial weakness: Secondary | ICD-10-CM | POA: Diagnosis not present

## 2019-11-01 DIAGNOSIS — E876 Hypokalemia: Secondary | ICD-10-CM | POA: Diagnosis not present

## 2019-11-01 DIAGNOSIS — I110 Hypertensive heart disease with heart failure: Secondary | ICD-10-CM | POA: Insufficient documentation

## 2019-11-01 DIAGNOSIS — J449 Chronic obstructive pulmonary disease, unspecified: Secondary | ICD-10-CM | POA: Insufficient documentation

## 2019-11-01 LAB — DIFFERENTIAL
Abs Immature Granulocytes: 0.01 10*3/uL (ref 0.00–0.07)
Basophils Absolute: 0 10*3/uL (ref 0.0–0.1)
Basophils Relative: 0 %
Eosinophils Absolute: 0.1 10*3/uL (ref 0.0–0.5)
Eosinophils Relative: 1 %
Immature Granulocytes: 0 %
Lymphocytes Relative: 21 %
Lymphs Abs: 1.2 10*3/uL (ref 0.7–4.0)
Monocytes Absolute: 0.4 10*3/uL (ref 0.1–1.0)
Monocytes Relative: 7 %
Neutro Abs: 4.1 10*3/uL (ref 1.7–7.7)
Neutrophils Relative %: 71 %

## 2019-11-01 LAB — APTT: aPTT: 27 seconds (ref 24–36)

## 2019-11-01 LAB — COMPREHENSIVE METABOLIC PANEL
ALT: 18 U/L (ref 0–44)
AST: 26 U/L (ref 15–41)
Albumin: 5.1 g/dL — ABNORMAL HIGH (ref 3.5–5.0)
Alkaline Phosphatase: 110 U/L (ref 38–126)
Anion gap: 14 (ref 5–15)
BUN: 17 mg/dL (ref 8–23)
CO2: 27 mmol/L (ref 22–32)
Calcium: 9.7 mg/dL (ref 8.9–10.3)
Chloride: 93 mmol/L — ABNORMAL LOW (ref 98–111)
Creatinine, Ser: 1.26 mg/dL — ABNORMAL HIGH (ref 0.44–1.00)
GFR calc Af Amer: 53 mL/min — ABNORMAL LOW (ref >60)
GFR calc non Af Amer: 46 mL/min — ABNORMAL LOW (ref >60)
Glucose, Bld: 123 mg/dL — ABNORMAL HIGH (ref 70–99)
Potassium: 3.3 mmol/L — ABNORMAL LOW (ref 3.5–5.1)
Sodium: 134 mmol/L — ABNORMAL LOW (ref 135–145)
Total Bilirubin: 0.7 mg/dL (ref 0.3–1.2)
Total Protein: 8.4 g/dL — ABNORMAL HIGH (ref 6.5–8.1)

## 2019-11-01 LAB — CBC
HCT: 48.6 % — ABNORMAL HIGH (ref 36.0–46.0)
Hemoglobin: 16 g/dL — ABNORMAL HIGH (ref 12.0–15.0)
MCH: 31.6 pg (ref 26.0–34.0)
MCHC: 32.9 g/dL (ref 30.0–36.0)
MCV: 95.9 fL (ref 80.0–100.0)
Platelets: 90 10*3/uL — ABNORMAL LOW (ref 150–400)
RBC: 5.07 MIL/uL (ref 3.87–5.11)
RDW: 12.8 % (ref 11.5–15.5)
WBC: 5.9 10*3/uL (ref 4.0–10.5)
nRBC: 0 % (ref 0.0–0.2)

## 2019-11-01 LAB — RAPID URINE DRUG SCREEN, HOSP PERFORMED
Amphetamines: NOT DETECTED
Barbiturates: NOT DETECTED
Benzodiazepines: NOT DETECTED
Cocaine: NOT DETECTED
Opiates: POSITIVE — AB
Tetrahydrocannabinol: NOT DETECTED

## 2019-11-01 LAB — URINALYSIS, ROUTINE W REFLEX MICROSCOPIC
Bilirubin Urine: NEGATIVE
Glucose, UA: NEGATIVE mg/dL
Ketones, ur: NEGATIVE mg/dL
Nitrite: NEGATIVE
Protein, ur: NEGATIVE mg/dL
Specific Gravity, Urine: 1.004 — ABNORMAL LOW (ref 1.005–1.030)
pH: 5 (ref 5.0–8.0)

## 2019-11-01 LAB — CBG MONITORING, ED: Glucose-Capillary: 127 mg/dL — ABNORMAL HIGH (ref 70–99)

## 2019-11-01 LAB — ETHANOL: Alcohol, Ethyl (B): <10 mg/dL (ref <10)

## 2019-11-01 LAB — PROTIME-INR
INR: 0.9 (ref 0.8–1.2)
Prothrombin Time: 11.5 seconds (ref 11.4–15.2)

## 2019-11-01 MED ORDER — POTASSIUM CHLORIDE CRYS ER 20 MEQ PO TBCR
20.0000 meq | EXTENDED_RELEASE_TABLET | Freq: Two times a day (BID) | ORAL | 0 refills | Status: DC
Start: 2019-11-01 — End: 2023-09-27

## 2019-11-01 MED ORDER — POTASSIUM CHLORIDE CRYS ER 20 MEQ PO TBCR
40.0000 meq | EXTENDED_RELEASE_TABLET | Freq: Once | ORAL | Status: AC
  Administered 2019-11-01: 40 meq via ORAL
  Filled 2019-11-01: qty 2

## 2019-11-01 NOTE — ED Notes (Signed)
Pt assisted to restroom.  

## 2019-11-01 NOTE — ED Triage Notes (Signed)
Pt states she thinks she is having a stroke. Asked pt to elaborate and she states she thinks she is having a stroke because her "L. Foot is drawn to side, L. Side of her mouth keeps drawing up by itself, and she is having severe back and shoulder pain". LKN per pt was 0145.

## 2019-11-01 NOTE — ED Notes (Signed)
Pt transported to CT Scan

## 2019-11-01 NOTE — Discharge Instructions (Addendum)
Your evaluation did not show any sign of a stroke or any other serious condition.  However, your potassium was slightly low and you are being given a prescription for potassium to take for the next 5 days.  Please follow-up with your primary care provider in the next 3 days, return to the emergency department if you have any new or concerning symptoms that develop before then.

## 2019-11-01 NOTE — ED Provider Notes (Signed)
[facility] EMERGENCY DEPARTMENT Provider Note   CSN: [id number] Arrival date & time: 11/01/19  0155   History Chief Complaint  Patient presents with  . Cerebrovascular Accident    [name] is a 64 y.o. female.  The history is provided by the patient.  Cerebrovascular Accident  She has history hypertension, diabetes, hyperlipidemia, COPD, heart failure, coronary artery disease, peripheral vascular disease comes in concerned that she is having a stroke.  She noted onset 15 minutes before arriving in the ED that the left side of her face seemed to be drawing and her left foot seem to be turning inward.  She also is complaining of pain in her right foot and then her left shoulder and in her mid back.  These pains are chronic but do seem to be worse at the moment.  She denies headache and denies nausea or vomiting.  She has never had symptoms like this before.  Past Medical History:  Diagnosis Date  . Anemia   . Anginal pain ([place])    occ last time last wk  . Anxiety   . Arthritis   . Asthma   . Atherosclerosis of native arteries of the extremities with rest pain 12/10/2008   Qualifier: Diagnosis of  By: Owens Shark, RN, BSN, Lauren    . Blood transfusion   . Cancer (HCC)    hx of non hogdin lymphoma  . Carotid artery disease (HCC)    hx of carotid cerebrovascular disease with 0-39% bilateral internal carotid artery setenosis   . CHF (congestive heart failure) ([place])   . Chronic back pain    status post lumbar surgery  . COPD 10/27/2008   Qualifier: Diagnosis of  By: Owens Shark, RN, BSN, Lauren    . COPD (chronic obstructive pulmonary disease) ([place])   . Coronary artery disease    status post stenting of the aortic and iliac vessels by Dr. Gwenlyn Found. Repeat aortic stenting 2010  . Depression   . Diabetes mellitus without complication ([place])   . Dysrhythmia    ?  [name] GERD (gastroesophageal reflux disease)   . History of kidney stones   . Hyperlipidemia   . Hypertension   . 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 Left ventricular dysfunction    hx of with ejection fraction of 35% range.  . Lymphocytic lymphoma ([place]) 12/24/2013  . Myocardial infarction ([place])   . Non Hodgkin's lymphoma ([place])   . Non-Hodgkin lymphoma, B-cell, low grade 12/24/2013  . Osteoporosis   . Other malignant lymphomas, unspecified site, extranodal and solid organ sites 2004   Back  . Peripheral vascular disease ([place])   . Pneumonia    hx  . PVD 10/27/2008   Qualifier: Diagnosis of  By: Owens Shark, RN, BSN, Lauren    . Seizures ([place])    ? yrs ago  . Shortness of breath   . Stroke ([place]) 04  . Vulvar cancer [facility])     Patient Active Problem List   Diagnosis Date Noted  . Malnutrition of moderate degree 08/20/2015  . Encephalopathy 08/18/2015  . COPD (chronic obstructive pulmonary disease) ([place]) 08/18/2015  . Protein-calorie malnutrition, severe 08/11/2015  . HCAP (healthcare-associated pneumonia) 08/09/2015  . Acute respiratory failure with hypoxia ([place]) 08/09/2015  . COPD with exacerbation ([place]) 08/09/2015  . Acute kidney injury ([place]) 08/09/2015  . Hypokalemia 08/09/2015  . Hyponatremia 08/09/2015  . Lymphocytic lymphoma ([place]) 08/09/2015  . Follicular lymphoma grade I of extranodal and solid organ sites ([place]) 12/24/2013  .  Langerhans cell histiocytosis of lung, 2009 11/10/2013  . 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 Vulvar cancer ([place]) 05/02/2012  . AORTIC ATHEROSCLEROSIS 08/12/2009  . ATHEROSCLEROSIS, RENAL ARTERY 08/12/2009  . Atherosclerosis of native arteries of the extremities with rest pain 12/10/2008  . HYPERLIPIDEMIA 10/27/2008  . HYPERTENSION, BENIGN 10/27/2008  . CAD, NATIVE VESSEL 10/27/2008  . LEFT VENTRICULAR FUNCTION, DECREASED 10/27/2008  . CAROTID ARTERY DISEASE 10/27/2008  . PVD 10/27/2008  . COPD 10/27/2008  . NON-HODGKIN'S LYMPHOMA, HX OF 10/27/2008    Past Surgical History:  Procedure Laterality Date  . ABDOMINAL AORTAGRAM  N/A 08/08/2011   Procedure: ABDOMINAL Maxcine Ham;  Surgeon: Sherren Mocha, MD;  Location: [facility] CATH LAB;  Service: Cardiovascular;  Laterality: N/A;  . abdominal aortogram  08/08/2011  . ABDOMINAL HYSTERECTOMY     partial  . BACK SURGERY     x 2  . CARDIAC CATHETERIZATION    . DILATION AND CURETTAGE OF UTERUS    . lumar surg    . LUNG BIOPSY    . PERCUTANEOUS STENT INTERVENTION Bilateral 08/08/2011   Procedure: PERCUTANEOUS STENT INTERVENTION;  Surgeon: Sherren Mocha, MD;  Location: [facility] CATH LAB;  Service: Cardiovascular;  Laterality: Bilateral;  . PORTACATH PLACEMENT     pt says its for chemo only and its an old port(04)   . VIDEO ASSISTED THORACOSCOPY (VATS)/WEDGE RESECTION Right 12/02/2013   Procedure: VIDEO ASSISTED THORACOSCOPY (VATS)/WEDGE RESECTION;  Surgeon: Melrose Nakayama, MD;  Location: [facility];  Service: Thoracic;  Laterality: Right;  [place] Dana Milagros Loll BIOPSY  04/09/2012   Procedure: VULVAR BIOPSY;  Surgeon: Florian Buff, MD;  Location: [facility] ORS;  Service: Gynecology;  Laterality: N/A;  . VULVECTOMY  05/20/2012   Procedure: VULVECTOMY;  Surgeon: Alvino Chapel, MD;  Location: [facility] ORS;  Service: Gynecology;  Laterality: Bilateral;  BILATERAL SIMPLE VULVECTOMIES     OB History   No obstetric history on file.     Family History  Problem Relation Age of Onset  . Heart disease Mother   . Lung cancer Mother   . Heart attack Mother   . Heart failure Mother   . Cancer Mother   . Heart disease Father   . Kidney cancer Father   . Heart attack Father   . Heart failure Father   . Diabetes Sister   . Aortic aneurysm Sister   . Heart attack Brother   . Coronary artery disease Other        positive for vascular and cardiac disease  . Asthma Son   . Sleep apnea Son   . Endometriosis Daughter   . Clotting disorder Maternal Aunt     Social History   Tobacco Use  . Smoking status: Current Every Day Smoker    Packs/day: 2.50    Years: 40.00    Pack years: 100.00     Types: Cigarettes  . Smokeless tobacco: Never Used  Substance Use Topics  . Alcohol use: No  . Drug use: No    Home Medications Prior to Admission medications   Medication Sig Start Date End Date Taking? Authorizing Provider  acetaminophen (TYLENOL) 500 MG tablet Take 1,000 mg by mouth every 6 (six) hours as needed for moderate pain or headache.    [provider]  albuterol (PROVENTIL) (2.5 MG/3ML) 0.083% nebulizer solution Take 3 mLs (2.5 mg total) by nebulization every 4 (four) hours as needed for wheezing or shortness of breath. 08/17/15   Lucia Gaskins, MD  albuterol (VENTOLIN HFA)  108 (90 BASE) MCG/ACT inhaler Inhale 2 puffs into the lungs every 6 (six) hours as needed for wheezing or shortness of breath (wheezing and shortness of breath). Reported on 08/09/2015    [provider]  ALPRAZolam Duanne Moron) 1 MG tablet Take 0.5 tablets (0.5 mg total) by mouth 3 (three) times daily as needed for anxiety or sleep (sleep). You can cut your current tablets in half Patient taking differently: Take 0.5 mg by mouth 3 (three) times daily as needed for anxiety or sleep (sleep). You can cut your current tablets in half 08/24/15   Sinda Du, MD  BREO ELLIPTA 100-25 MCG/INH AEPB Take 1 Inhaler by mouth daily.  07/06/15   [provider]  gabapentin (NEURONTIN) 300 MG capsule Take 300 mg by mouth 3 (three) times daily.    [provider]  ibuprofen (ADVIL,MOTRIN) 200 MG tablet Take 200 mg by mouth every 6 (six) hours as needed.    [provider]  Multiple Vitamin (MULTIVITAMIN WITH MINERALS) TABS tablet Take 1 tablet by mouth daily.    [provider]  mupirocin ointment (BACTROBAN) 2 % Apply 1 application topically daily as needed (leg infection). Reported on 08/09/2015 03/13/12   [provider]  nitroGLYCERIN (NITROSTAT) 0.4 MG SL tablet DISSOLVE 1 TABLET UNDER TONGUE EVERY 5 MINUTES UP TO 15 MIN FOR CHESTPAIN. IF NO RELIEF CALL 911. 02/27/19    Sherren Mocha, MD  oxyCODONE (ROXICODONE) 15 MG immediate release tablet Take 15 mg by mouth every 6 (six) hours as needed for pain.    [provider]  oxyCODONE 30 MG 12 hr tablet Take 1 tablet by mouth 2 (two) times daily.    [provider]  PARoxetine (PAXIL) 10 MG tablet Take 10 mg by mouth daily.    [provider]  Polyvinyl Alcohol-Povidone (CLEAR EYES ALL SEASONS OP) Place 1-2 drops into both eyes daily as needed.    [provider]  [facility] 160-4.5 MCG/ACT inhaler  10/30/17   [provider]  traZODone (DESYREL) 100 MG tablet Take 100 mg by mouth at bedtime. 11/29/16   [provider]  triamcinolone cream (KENALOG) 0.1 % Apply 1 application topically daily as needed. 09/19/16   [provider]    Allergies    Morphine and related and Naproxen  Review of Systems   Review of Systems  All other systems reviewed and are negative.   Physical Exam Updated Vital Signs BP 120/61   Pulse 67   Resp 11   Ht 5\' 4"  (1.626 m)   Wt 55.5 kg   SpO2 98%   BMI 21.00 kg/m   Physical Exam Vitals and nursing note reviewed.   64 year old female, very anxious, but in no acute distress. Vital signs are normal. Oxygen saturation is 98%, which is normal. Head is normocephalic and atraumatic. PERRLA, EOMI. Oropharynx is clear. Neck is nontender and supple without adenopathy or JVD.  Transmitted murmur heard over both carotid arteries. Back is nontender and there is no CVA tenderness. Lungs are clear without rales, wheezes, or rhonchi. Chest is nontender. Heart has regular rate and rhythm with 3/6 systolic ejection murmur heard at the cardiac base with radiation to the neck. Abdomen is soft, flat, nontender without masses or hepatosplenomegaly and peristalsis is normoactive. Extremities have no cyanosis or edema, full range of motion is present. Skin is warm and dry without rash. Neurologic: Awake and alert, oriented, very  anxious, speech normal, cranial nerves are intact.  [name]  somewhat tremulous.  Motor strength is 5/5 in all 4 extremities.  There is no pronator drift.  Sensory exam is normal and there is no extinction on double simultaneous stimulation.  Finger-to-nose testing is normal.  ED Results / Procedures / Treatments   Labs (all labs ordered are listed, but only abnormal results are displayed) Labs Reviewed  CBC - Abnormal; Notable for the following components:      Result Value   Hemoglobin 16.0 (*)    HCT 48.6 (*)    Platelets 90 (*)    All other components within normal limits  COMPREHENSIVE METABOLIC PANEL - Abnormal; Notable for the following components:   Sodium 134 (*)    Potassium 3.3 (*)    Chloride 93 (*)    Glucose, Bld 123 (*)    Creatinine, Ser 1.26 (*)    Total Protein 8.4 (*)    Albumin 5.1 (*)    GFR calc non Af Amer 46 (*)    GFR calc Af Amer 53 (*)    All other components within normal limits  RAPID URINE DRUG SCREEN, HOSP PERFORMED - Abnormal; Notable for the following components:   Opiates POSITIVE (*)    All other components within normal limits  URINALYSIS, ROUTINE W REFLEX MICROSCOPIC - Abnormal; Notable for the following components:   Color, Urine STRAW (*)    Specific Gravity, Urine 1.004 (*)    Hgb urine dipstick SMALL (*)    Leukocytes,Ua SMALL (*)    Bacteria, UA RARE (*)    All other components within normal limits  CBG MONITORING, ED - Abnormal; Notable for the following components:   Glucose-Capillary 127 (*)    All other components within normal limits  ETHANOL  PROTIME-INR  APTT  DIFFERENTIAL    EKG EKG Interpretation  Date/Time:  Sunday Nov 01 2019 02:08:41 EDT Ventricular Rate:  74 PR Interval:    QRS Duration: 112 QT Interval:  424 QTC Calculation: 471 R Axis:   86 Text Interpretation: Sinus rhythm Borderline intraventricular conduction delay Otherwise within normal limits When compared with ECG of 08/18/2015, Nonspecific ST abnormality  is no longer present Confirmed by Delora Fuel ([id number]) on 11/01/2019 2:13:41 AM   Radiology CT HEAD WO CONTRAST  Result Date: 11/01/2019 CLINICAL DATA:  Neuro deficit, stroke suspected, severe back and shoulder pain, last known normal at 0145 EXAM: CT HEAD WITHOUT CONTRAST TECHNIQUE: Contiguous axial images were obtained from the base of the skull through the vertex without intravenous contrast. COMPARISON:  CT head 08/20/2015, MRI 08/22/2015 FINDINGS: Brain: No evidence of acute infarction, hemorrhage, hydrocephalus, extra-axial collection or mass lesion/mass effect. Prominence of the ventricles, cisterns and sulci compatible with at most mild parenchymal volume loss. Slight asymmetry of the lateral ventricles is similar to prior. Stable patchy areas of white matter hypoattenuation are most compatible with chronic microvascular angiopathy. Vascular: Atherosclerotic calcification of the carotid siphons. No hyperdense vessel. Skull: No calvarial fracture or suspicious osseous lesion. No scalp swelling or hematoma. Sinuses/Orbits: Paranasal sinuses are predominantly clear. Small bilateral mastoid effusions. Middle ear cavities are clear. Ossicular chains normally configured. Included orbital structures are unremarkable. Other: None IMPRESSION: 1. No acute intracranial abnormality. If persisting clinical concern for acute infarct, MRI is more sensitive and specific for early changes of ischemia. 2. Stable mild parenchymal volume loss and chronic microvascular angiopathy. 3. Small bilateral mastoid effusions. Electronically Signed   By: Lovena Le M.D.   On: 11/01/2019 03:15    Procedures Procedures   Medications Ordered in  ED Medications  potassium chloride SA (KLOR-CON) CR tablet 40 mEq (40 mEq Oral Given 11/01/19 0353)    ED Course  I have reviewed the triage vital signs and the nursing notes.  Pertinent labs & imaging results that were available during my care of the patient were reviewed by me and  considered in my medical decision making (see chart for details).  MDM Rules/Calculators/A&P Rather confusing picture.  Code stroke had been activated prior to my seeing the patient, but I do not see any acute neurologic event.  Also, no clear cause for her shoulder and back and foot pain.  Old records are reviewed, and I see no relevant past visits.  CT of head was unremarkable.  Drug screen is positive for opiates which patient is known to be taking.  Remainder of labs were significant for mild hypokalemia of uncertain cause.  She was given a dose of oral potassium.  I went back to reevaluate the patient, and she is resting comfortably and is no longer tremulous.  She states she is feeling much better.  I am not sure what her initial event was, but it appears to have resolved and I do not see any evidence of any serious ongoing pathology.  [name] discharged with prescription for K-Dur and is advised to follow-up with PCP.  Return precautions discussed.  Final Clinical Impression(s) / ED Diagnoses Final diagnoses:  Pain in right foot  Acute pain of left shoulder  Hypokalemia    Rx / DC Orders ED Discharge Orders         Ordered    potassium chloride SA (KLOR-CON) 20 MEQ tablet  2 times daily     11/01/19 0000000           Delora Fuel, MD [id number]

## 2019-11-11 DIAGNOSIS — R5383 Other fatigue: Secondary | ICD-10-CM | POA: Diagnosis not present

## 2019-11-11 DIAGNOSIS — D539 Nutritional anemia, unspecified: Secondary | ICD-10-CM | POA: Diagnosis not present

## 2019-11-11 DIAGNOSIS — E559 Vitamin D deficiency, unspecified: Secondary | ICD-10-CM | POA: Diagnosis not present

## 2019-11-11 DIAGNOSIS — Z9181 History of falling: Secondary | ICD-10-CM | POA: Diagnosis not present

## 2019-11-11 DIAGNOSIS — Z1331 Encounter for screening for depression: Secondary | ICD-10-CM | POA: Diagnosis not present

## 2019-11-11 DIAGNOSIS — Z1159 Encounter for screening for other viral diseases: Secondary | ICD-10-CM | POA: Diagnosis not present

## 2019-11-11 DIAGNOSIS — F17210 Nicotine dependence, cigarettes, uncomplicated: Secondary | ICD-10-CM | POA: Diagnosis not present

## 2019-11-11 DIAGNOSIS — Z113 Encounter for screening for infections with a predominantly sexual mode of transmission: Secondary | ICD-10-CM | POA: Diagnosis not present

## 2019-11-11 DIAGNOSIS — Z0000 Encounter for general adult medical examination without abnormal findings: Secondary | ICD-10-CM | POA: Diagnosis not present

## 2019-11-11 DIAGNOSIS — Z79899 Other long term (current) drug therapy: Secondary | ICD-10-CM | POA: Diagnosis not present

## 2019-11-11 DIAGNOSIS — Z20822 Contact with and (suspected) exposure to covid-19: Secondary | ICD-10-CM | POA: Diagnosis not present

## 2019-11-11 DIAGNOSIS — Z114 Encounter for screening for human immunodeficiency virus [HIV]: Secondary | ICD-10-CM | POA: Diagnosis not present

## 2019-11-11 DIAGNOSIS — F17200 Nicotine dependence, unspecified, uncomplicated: Secondary | ICD-10-CM | POA: Diagnosis not present

## 2019-11-25 DIAGNOSIS — F17210 Nicotine dependence, cigarettes, uncomplicated: Secondary | ICD-10-CM | POA: Diagnosis not present

## 2019-11-25 DIAGNOSIS — Z79899 Other long term (current) drug therapy: Secondary | ICD-10-CM | POA: Diagnosis not present

## 2019-11-25 DIAGNOSIS — M545 Low back pain: Secondary | ICD-10-CM | POA: Diagnosis not present

## 2019-11-25 DIAGNOSIS — F411 Generalized anxiety disorder: Secondary | ICD-10-CM | POA: Diagnosis not present

## 2019-12-02 ENCOUNTER — Other Ambulatory Visit: Payer: Self-pay | Admitting: Nurse Practitioner

## 2019-12-02 DIAGNOSIS — Z780 Asymptomatic menopausal state: Secondary | ICD-10-CM

## 2019-12-02 DIAGNOSIS — Z1231 Encounter for screening mammogram for malignant neoplasm of breast: Secondary | ICD-10-CM

## 2019-12-05 DIAGNOSIS — F411 Generalized anxiety disorder: Secondary | ICD-10-CM | POA: Diagnosis not present

## 2019-12-05 DIAGNOSIS — F331 Major depressive disorder, recurrent, moderate: Secondary | ICD-10-CM | POA: Diagnosis not present

## 2019-12-05 DIAGNOSIS — F1320 Sedative, hypnotic or anxiolytic dependence, uncomplicated: Secondary | ICD-10-CM | POA: Diagnosis not present

## 2019-12-05 DIAGNOSIS — G4700 Insomnia, unspecified: Secondary | ICD-10-CM | POA: Diagnosis not present

## 2019-12-23 DIAGNOSIS — F17210 Nicotine dependence, cigarettes, uncomplicated: Secondary | ICD-10-CM | POA: Diagnosis not present

## 2019-12-23 DIAGNOSIS — M545 Low back pain: Secondary | ICD-10-CM | POA: Diagnosis not present

## 2019-12-23 DIAGNOSIS — Z79899 Other long term (current) drug therapy: Secondary | ICD-10-CM | POA: Diagnosis not present

## 2020-01-02 DIAGNOSIS — F17210 Nicotine dependence, cigarettes, uncomplicated: Secondary | ICD-10-CM | POA: Diagnosis not present

## 2020-01-02 DIAGNOSIS — F411 Generalized anxiety disorder: Secondary | ICD-10-CM | POA: Diagnosis not present

## 2020-01-02 DIAGNOSIS — G4700 Insomnia, unspecified: Secondary | ICD-10-CM | POA: Diagnosis not present

## 2020-01-02 DIAGNOSIS — F1320 Sedative, hypnotic or anxiolytic dependence, uncomplicated: Secondary | ICD-10-CM | POA: Diagnosis not present

## 2020-01-02 DIAGNOSIS — F331 Major depressive disorder, recurrent, moderate: Secondary | ICD-10-CM | POA: Diagnosis not present

## 2020-01-13 DIAGNOSIS — M545 Low back pain: Secondary | ICD-10-CM | POA: Diagnosis not present

## 2020-01-13 DIAGNOSIS — F17210 Nicotine dependence, cigarettes, uncomplicated: Secondary | ICD-10-CM | POA: Diagnosis not present

## 2020-01-13 DIAGNOSIS — Z79899 Other long term (current) drug therapy: Secondary | ICD-10-CM | POA: Diagnosis not present

## 2020-02-17 ENCOUNTER — Other Ambulatory Visit: Payer: PPO

## 2020-02-17 ENCOUNTER — Ambulatory Visit: Payer: PPO

## 2020-02-17 ENCOUNTER — Other Ambulatory Visit: Payer: Self-pay | Admitting: Nurse Practitioner

## 2020-02-17 DIAGNOSIS — Z79899 Other long term (current) drug therapy: Secondary | ICD-10-CM | POA: Diagnosis not present

## 2020-02-17 DIAGNOSIS — M545 Low back pain: Secondary | ICD-10-CM | POA: Diagnosis not present

## 2020-02-17 DIAGNOSIS — Z780 Asymptomatic menopausal state: Secondary | ICD-10-CM

## 2020-02-17 DIAGNOSIS — F17210 Nicotine dependence, cigarettes, uncomplicated: Secondary | ICD-10-CM | POA: Diagnosis not present

## 2020-02-27 DIAGNOSIS — F331 Major depressive disorder, recurrent, moderate: Secondary | ICD-10-CM | POA: Diagnosis not present

## 2020-02-27 DIAGNOSIS — F1320 Sedative, hypnotic or anxiolytic dependence, uncomplicated: Secondary | ICD-10-CM | POA: Diagnosis not present

## 2020-02-27 DIAGNOSIS — F17210 Nicotine dependence, cigarettes, uncomplicated: Secondary | ICD-10-CM | POA: Diagnosis not present

## 2020-02-27 DIAGNOSIS — G4700 Insomnia, unspecified: Secondary | ICD-10-CM | POA: Diagnosis not present

## 2020-02-27 DIAGNOSIS — F411 Generalized anxiety disorder: Secondary | ICD-10-CM | POA: Diagnosis not present

## 2020-03-22 DIAGNOSIS — M545 Low back pain: Secondary | ICD-10-CM | POA: Diagnosis not present

## 2020-03-22 DIAGNOSIS — F17210 Nicotine dependence, cigarettes, uncomplicated: Secondary | ICD-10-CM | POA: Diagnosis not present

## 2020-03-22 DIAGNOSIS — Z79899 Other long term (current) drug therapy: Secondary | ICD-10-CM | POA: Diagnosis not present

## 2020-04-14 DIAGNOSIS — F411 Generalized anxiety disorder: Secondary | ICD-10-CM | POA: Diagnosis not present

## 2020-04-14 DIAGNOSIS — Z79899 Other long term (current) drug therapy: Secondary | ICD-10-CM | POA: Diagnosis not present

## 2020-04-14 DIAGNOSIS — J209 Acute bronchitis, unspecified: Secondary | ICD-10-CM | POA: Diagnosis not present

## 2020-04-14 DIAGNOSIS — G4700 Insomnia, unspecified: Secondary | ICD-10-CM | POA: Diagnosis not present

## 2020-04-23 DIAGNOSIS — F411 Generalized anxiety disorder: Secondary | ICD-10-CM | POA: Diagnosis not present

## 2020-04-23 DIAGNOSIS — F17210 Nicotine dependence, cigarettes, uncomplicated: Secondary | ICD-10-CM | POA: Diagnosis not present

## 2020-04-23 DIAGNOSIS — F331 Major depressive disorder, recurrent, moderate: Secondary | ICD-10-CM | POA: Diagnosis not present

## 2020-04-23 DIAGNOSIS — G4700 Insomnia, unspecified: Secondary | ICD-10-CM | POA: Diagnosis not present

## 2020-04-23 DIAGNOSIS — F1320 Sedative, hypnotic or anxiolytic dependence, uncomplicated: Secondary | ICD-10-CM | POA: Diagnosis not present

## 2020-05-11 DIAGNOSIS — M545 Low back pain, unspecified: Secondary | ICD-10-CM | POA: Diagnosis not present

## 2020-05-11 DIAGNOSIS — Z79899 Other long term (current) drug therapy: Secondary | ICD-10-CM | POA: Diagnosis not present

## 2020-05-20 DIAGNOSIS — Z79899 Other long term (current) drug therapy: Secondary | ICD-10-CM | POA: Diagnosis not present

## 2020-05-20 DIAGNOSIS — G8929 Other chronic pain: Secondary | ICD-10-CM | POA: Diagnosis not present

## 2020-05-20 DIAGNOSIS — F17210 Nicotine dependence, cigarettes, uncomplicated: Secondary | ICD-10-CM | POA: Diagnosis not present

## 2020-05-20 DIAGNOSIS — M545 Low back pain, unspecified: Secondary | ICD-10-CM | POA: Diagnosis not present

## 2020-05-20 DIAGNOSIS — M25561 Pain in right knee: Secondary | ICD-10-CM | POA: Diagnosis not present

## 2020-05-20 DIAGNOSIS — M25562 Pain in left knee: Secondary | ICD-10-CM | POA: Diagnosis not present

## 2020-06-01 DIAGNOSIS — Z79899 Other long term (current) drug therapy: Secondary | ICD-10-CM | POA: Diagnosis not present

## 2020-06-01 DIAGNOSIS — M545 Low back pain, unspecified: Secondary | ICD-10-CM | POA: Diagnosis not present

## 2020-06-18 DIAGNOSIS — F411 Generalized anxiety disorder: Secondary | ICD-10-CM | POA: Diagnosis not present

## 2020-06-18 DIAGNOSIS — G4700 Insomnia, unspecified: Secondary | ICD-10-CM | POA: Diagnosis not present

## 2020-06-18 DIAGNOSIS — F17210 Nicotine dependence, cigarettes, uncomplicated: Secondary | ICD-10-CM | POA: Diagnosis not present

## 2020-06-18 DIAGNOSIS — F331 Major depressive disorder, recurrent, moderate: Secondary | ICD-10-CM | POA: Diagnosis not present

## 2020-06-18 DIAGNOSIS — F1320 Sedative, hypnotic or anxiolytic dependence, uncomplicated: Secondary | ICD-10-CM | POA: Diagnosis not present

## 2020-07-04 DIAGNOSIS — Z79899 Other long term (current) drug therapy: Secondary | ICD-10-CM | POA: Diagnosis not present

## 2020-07-04 DIAGNOSIS — F17210 Nicotine dependence, cigarettes, uncomplicated: Secondary | ICD-10-CM | POA: Diagnosis not present

## 2020-07-04 DIAGNOSIS — M545 Low back pain, unspecified: Secondary | ICD-10-CM | POA: Diagnosis not present

## 2020-08-02 DIAGNOSIS — M545 Low back pain, unspecified: Secondary | ICD-10-CM | POA: Diagnosis not present

## 2020-08-02 DIAGNOSIS — Z79899 Other long term (current) drug therapy: Secondary | ICD-10-CM | POA: Diagnosis not present

## 2020-08-02 DIAGNOSIS — F17210 Nicotine dependence, cigarettes, uncomplicated: Secondary | ICD-10-CM | POA: Diagnosis not present

## 2020-08-13 DIAGNOSIS — F411 Generalized anxiety disorder: Secondary | ICD-10-CM | POA: Diagnosis not present

## 2020-08-13 DIAGNOSIS — F17210 Nicotine dependence, cigarettes, uncomplicated: Secondary | ICD-10-CM | POA: Diagnosis not present

## 2020-08-13 DIAGNOSIS — F331 Major depressive disorder, recurrent, moderate: Secondary | ICD-10-CM | POA: Diagnosis not present

## 2020-08-13 DIAGNOSIS — G4700 Insomnia, unspecified: Secondary | ICD-10-CM | POA: Diagnosis not present

## 2020-08-13 DIAGNOSIS — F1320 Sedative, hypnotic or anxiolytic dependence, uncomplicated: Secondary | ICD-10-CM | POA: Diagnosis not present

## 2020-08-31 DIAGNOSIS — Z9189 Other specified personal risk factors, not elsewhere classified: Secondary | ICD-10-CM | POA: Diagnosis not present

## 2020-08-31 DIAGNOSIS — M545 Low back pain, unspecified: Secondary | ICD-10-CM | POA: Diagnosis not present

## 2020-09-30 DIAGNOSIS — J449 Chronic obstructive pulmonary disease, unspecified: Secondary | ICD-10-CM | POA: Diagnosis not present

## 2020-09-30 DIAGNOSIS — Z79899 Other long term (current) drug therapy: Secondary | ICD-10-CM | POA: Diagnosis not present

## 2020-09-30 DIAGNOSIS — M545 Low back pain, unspecified: Secondary | ICD-10-CM | POA: Diagnosis not present

## 2020-10-01 DIAGNOSIS — F17210 Nicotine dependence, cigarettes, uncomplicated: Secondary | ICD-10-CM | POA: Diagnosis not present

## 2020-10-01 DIAGNOSIS — G4700 Insomnia, unspecified: Secondary | ICD-10-CM | POA: Diagnosis not present

## 2020-10-01 DIAGNOSIS — F1320 Sedative, hypnotic or anxiolytic dependence, uncomplicated: Secondary | ICD-10-CM | POA: Diagnosis not present

## 2020-10-01 DIAGNOSIS — F330 Major depressive disorder, recurrent, mild: Secondary | ICD-10-CM | POA: Diagnosis not present

## 2020-10-01 DIAGNOSIS — F411 Generalized anxiety disorder: Secondary | ICD-10-CM | POA: Diagnosis not present

## 2020-10-27 DIAGNOSIS — M545 Low back pain, unspecified: Secondary | ICD-10-CM | POA: Diagnosis not present

## 2020-10-27 DIAGNOSIS — Z79899 Other long term (current) drug therapy: Secondary | ICD-10-CM | POA: Diagnosis not present

## 2020-10-27 DIAGNOSIS — J449 Chronic obstructive pulmonary disease, unspecified: Secondary | ICD-10-CM | POA: Diagnosis not present

## 2020-11-25 DIAGNOSIS — Z79899 Other long term (current) drug therapy: Secondary | ICD-10-CM | POA: Diagnosis not present

## 2020-11-25 DIAGNOSIS — Z6821 Body mass index (BMI) 21.0-21.9, adult: Secondary | ICD-10-CM | POA: Diagnosis not present

## 2020-11-25 DIAGNOSIS — M545 Low back pain, unspecified: Secondary | ICD-10-CM | POA: Diagnosis not present

## 2020-11-25 DIAGNOSIS — G4700 Insomnia, unspecified: Secondary | ICD-10-CM | POA: Diagnosis not present

## 2020-11-26 DIAGNOSIS — F1320 Sedative, hypnotic or anxiolytic dependence, uncomplicated: Secondary | ICD-10-CM | POA: Diagnosis not present

## 2020-11-26 DIAGNOSIS — F411 Generalized anxiety disorder: Secondary | ICD-10-CM | POA: Diagnosis not present

## 2020-11-26 DIAGNOSIS — F330 Major depressive disorder, recurrent, mild: Secondary | ICD-10-CM | POA: Diagnosis not present

## 2020-11-26 DIAGNOSIS — G4700 Insomnia, unspecified: Secondary | ICD-10-CM | POA: Diagnosis not present

## 2020-11-30 DIAGNOSIS — Z79899 Other long term (current) drug therapy: Secondary | ICD-10-CM | POA: Diagnosis not present

## 2020-12-23 DIAGNOSIS — M545 Low back pain, unspecified: Secondary | ICD-10-CM | POA: Diagnosis not present

## 2020-12-23 DIAGNOSIS — Z6821 Body mass index (BMI) 21.0-21.9, adult: Secondary | ICD-10-CM | POA: Diagnosis not present

## 2020-12-23 DIAGNOSIS — G4700 Insomnia, unspecified: Secondary | ICD-10-CM | POA: Diagnosis not present

## 2020-12-23 DIAGNOSIS — Z79899 Other long term (current) drug therapy: Secondary | ICD-10-CM | POA: Diagnosis not present

## 2021-01-07 DIAGNOSIS — F330 Major depressive disorder, recurrent, mild: Secondary | ICD-10-CM | POA: Diagnosis not present

## 2021-01-07 DIAGNOSIS — F411 Generalized anxiety disorder: Secondary | ICD-10-CM | POA: Diagnosis not present

## 2021-01-07 DIAGNOSIS — F1320 Sedative, hypnotic or anxiolytic dependence, uncomplicated: Secondary | ICD-10-CM | POA: Diagnosis not present

## 2021-01-07 DIAGNOSIS — G4700 Insomnia, unspecified: Secondary | ICD-10-CM | POA: Diagnosis not present

## 2021-01-07 DIAGNOSIS — F17210 Nicotine dependence, cigarettes, uncomplicated: Secondary | ICD-10-CM | POA: Diagnosis not present

## 2021-01-18 DIAGNOSIS — M545 Low back pain, unspecified: Secondary | ICD-10-CM | POA: Diagnosis not present

## 2021-01-18 DIAGNOSIS — G4700 Insomnia, unspecified: Secondary | ICD-10-CM | POA: Diagnosis not present

## 2021-01-18 DIAGNOSIS — Z79899 Other long term (current) drug therapy: Secondary | ICD-10-CM | POA: Diagnosis not present

## 2021-02-17 DIAGNOSIS — Z79899 Other long term (current) drug therapy: Secondary | ICD-10-CM | POA: Diagnosis not present

## 2021-02-17 DIAGNOSIS — G4700 Insomnia, unspecified: Secondary | ICD-10-CM | POA: Diagnosis not present

## 2021-02-17 DIAGNOSIS — M545 Low back pain, unspecified: Secondary | ICD-10-CM | POA: Diagnosis not present

## 2021-02-21 DIAGNOSIS — Z79899 Other long term (current) drug therapy: Secondary | ICD-10-CM | POA: Diagnosis not present

## 2021-03-04 DIAGNOSIS — F330 Major depressive disorder, recurrent, mild: Secondary | ICD-10-CM | POA: Diagnosis not present

## 2021-03-04 DIAGNOSIS — F1320 Sedative, hypnotic or anxiolytic dependence, uncomplicated: Secondary | ICD-10-CM | POA: Diagnosis not present

## 2021-03-04 DIAGNOSIS — F17210 Nicotine dependence, cigarettes, uncomplicated: Secondary | ICD-10-CM | POA: Diagnosis not present

## 2021-03-04 DIAGNOSIS — F411 Generalized anxiety disorder: Secondary | ICD-10-CM | POA: Diagnosis not present

## 2021-03-04 DIAGNOSIS — G4700 Insomnia, unspecified: Secondary | ICD-10-CM | POA: Diagnosis not present

## 2021-03-16 DIAGNOSIS — M129 Arthropathy, unspecified: Secondary | ICD-10-CM | POA: Diagnosis not present

## 2021-03-16 DIAGNOSIS — Z79899 Other long term (current) drug therapy: Secondary | ICD-10-CM | POA: Diagnosis not present

## 2021-03-16 DIAGNOSIS — R5383 Other fatigue: Secondary | ICD-10-CM | POA: Diagnosis not present

## 2021-03-16 DIAGNOSIS — Z1159 Encounter for screening for other viral diseases: Secondary | ICD-10-CM | POA: Diagnosis not present

## 2021-03-16 DIAGNOSIS — E559 Vitamin D deficiency, unspecified: Secondary | ICD-10-CM | POA: Diagnosis not present

## 2021-03-16 DIAGNOSIS — Z0000 Encounter for general adult medical examination without abnormal findings: Secondary | ICD-10-CM | POA: Diagnosis not present

## 2021-03-16 DIAGNOSIS — Z131 Encounter for screening for diabetes mellitus: Secondary | ICD-10-CM | POA: Diagnosis not present

## 2021-03-16 DIAGNOSIS — F17210 Nicotine dependence, cigarettes, uncomplicated: Secondary | ICD-10-CM | POA: Diagnosis not present

## 2021-03-16 DIAGNOSIS — E7800 Pure hypercholesterolemia, unspecified: Secondary | ICD-10-CM | POA: Diagnosis not present

## 2021-03-29 DIAGNOSIS — H25813 Combined forms of age-related cataract, bilateral: Secondary | ICD-10-CM | POA: Diagnosis not present

## 2021-04-13 DIAGNOSIS — M545 Low back pain, unspecified: Secondary | ICD-10-CM | POA: Diagnosis not present

## 2021-04-13 DIAGNOSIS — Z23 Encounter for immunization: Secondary | ICD-10-CM | POA: Diagnosis not present

## 2021-04-13 DIAGNOSIS — Z79899 Other long term (current) drug therapy: Secondary | ICD-10-CM | POA: Diagnosis not present

## 2021-04-13 DIAGNOSIS — J449 Chronic obstructive pulmonary disease, unspecified: Secondary | ICD-10-CM | POA: Diagnosis not present

## 2021-04-13 DIAGNOSIS — N1831 Chronic kidney disease, stage 3a: Secondary | ICD-10-CM | POA: Diagnosis not present

## 2021-04-13 DIAGNOSIS — F331 Major depressive disorder, recurrent, moderate: Secondary | ICD-10-CM | POA: Diagnosis not present

## 2021-04-17 DIAGNOSIS — Z79899 Other long term (current) drug therapy: Secondary | ICD-10-CM | POA: Diagnosis not present

## 2021-05-12 DIAGNOSIS — J449 Chronic obstructive pulmonary disease, unspecified: Secondary | ICD-10-CM | POA: Diagnosis not present

## 2021-05-12 DIAGNOSIS — M545 Low back pain, unspecified: Secondary | ICD-10-CM | POA: Diagnosis not present

## 2021-05-12 DIAGNOSIS — Z79899 Other long term (current) drug therapy: Secondary | ICD-10-CM | POA: Diagnosis not present

## 2021-05-12 DIAGNOSIS — N1831 Chronic kidney disease, stage 3a: Secondary | ICD-10-CM | POA: Diagnosis not present

## 2021-05-12 DIAGNOSIS — Z23 Encounter for immunization: Secondary | ICD-10-CM | POA: Diagnosis not present

## 2021-05-12 DIAGNOSIS — F331 Major depressive disorder, recurrent, moderate: Secondary | ICD-10-CM | POA: Diagnosis not present

## 2021-05-13 DIAGNOSIS — F1320 Sedative, hypnotic or anxiolytic dependence, uncomplicated: Secondary | ICD-10-CM | POA: Diagnosis not present

## 2021-05-13 DIAGNOSIS — G4700 Insomnia, unspecified: Secondary | ICD-10-CM | POA: Diagnosis not present

## 2021-05-13 DIAGNOSIS — F330 Major depressive disorder, recurrent, mild: Secondary | ICD-10-CM | POA: Diagnosis not present

## 2021-05-13 DIAGNOSIS — F411 Generalized anxiety disorder: Secondary | ICD-10-CM | POA: Diagnosis not present

## 2021-05-16 DIAGNOSIS — Z79899 Other long term (current) drug therapy: Secondary | ICD-10-CM | POA: Diagnosis not present

## 2021-06-11 DIAGNOSIS — Z79899 Other long term (current) drug therapy: Secondary | ICD-10-CM | POA: Diagnosis not present

## 2021-07-31 ENCOUNTER — Encounter (HOSPITAL_COMMUNITY): Admission: RE | Admit: 2021-07-31 | Payer: PPO | Source: Ambulatory Visit

## 2021-08-02 DEATH — deceased

## 2021-08-04 ENCOUNTER — Ambulatory Visit: Admit: 2021-08-04 | Payer: PPO | Admitting: Ophthalmology

## 2021-08-04 SURGERY — PHACOEMULSIFICATION, CATARACT, WITH IOL INSERTION
Anesthesia: Monitor Anesthesia Care | Laterality: Left

## 2023-05-21 ENCOUNTER — Other Ambulatory Visit: Payer: Self-pay
